# Patient Record
Sex: Female | Born: 1945 | Race: White | Hispanic: No | Marital: Married
Health system: Midwestern US, Community
[De-identification: ages and names within clinical notes are randomized; demographics above are authoritative.]

## PROBLEM LIST (undated history)

## (undated) DIAGNOSIS — Z1239 Encounter for other screening for malignant neoplasm of breast: Secondary | ICD-10-CM

## (undated) DIAGNOSIS — G43909 Migraine, unspecified, not intractable, without status migrainosus: Secondary | ICD-10-CM

## (undated) DIAGNOSIS — M7502 Adhesive capsulitis of left shoulder: Secondary | ICD-10-CM

## (undated) DIAGNOSIS — K449 Diaphragmatic hernia without obstruction or gangrene: Secondary | ICD-10-CM

## (undated) DIAGNOSIS — N390 Urinary tract infection, site not specified: Secondary | ICD-10-CM

## (undated) DIAGNOSIS — D509 Iron deficiency anemia, unspecified: Secondary | ICD-10-CM

## (undated) DIAGNOSIS — D500 Iron deficiency anemia secondary to blood loss (chronic): Secondary | ICD-10-CM

## (undated) DIAGNOSIS — R059 Cough, unspecified: Secondary | ICD-10-CM

## (undated) DIAGNOSIS — R634 Abnormal weight loss: Secondary | ICD-10-CM

## (undated) DIAGNOSIS — I10 Essential (primary) hypertension: Secondary | ICD-10-CM

## (undated) DIAGNOSIS — E0500 Thyrotoxicosis with diffuse goiter without thyrotoxic crisis or storm: Secondary | ICD-10-CM

## (undated) DIAGNOSIS — E0590 Thyrotoxicosis, unspecified without thyrotoxic crisis or storm: Secondary | ICD-10-CM

## (undated) DIAGNOSIS — R7989 Other specified abnormal findings of blood chemistry: Secondary | ICD-10-CM

## (undated) DIAGNOSIS — M542 Cervicalgia: Secondary | ICD-10-CM

## (undated) DIAGNOSIS — G43809 Other migraine, not intractable, without status migrainosus: Secondary | ICD-10-CM

## (undated) DIAGNOSIS — D508 Other iron deficiency anemias: Secondary | ICD-10-CM

## (undated) DIAGNOSIS — E063 Autoimmune thyroiditis: Secondary | ICD-10-CM

## (undated) DIAGNOSIS — M2241 Chondromalacia patellae, right knee: Secondary | ICD-10-CM

## (undated) DIAGNOSIS — M5413 Radiculopathy, cervicothoracic region: Secondary | ICD-10-CM

## (undated) DIAGNOSIS — G43719 Chronic migraine without aura, intractable, without status migrainosus: Secondary | ICD-10-CM

## (undated) DIAGNOSIS — G589 Mononeuropathy, unspecified: Secondary | ICD-10-CM

## (undated) DIAGNOSIS — G43711 Chronic migraine without aura, intractable, with status migrainosus: Secondary | ICD-10-CM

## (undated) DIAGNOSIS — D124 Benign neoplasm of descending colon: Secondary | ICD-10-CM

## (undated) DIAGNOSIS — E785 Hyperlipidemia, unspecified: Secondary | ICD-10-CM

## (undated) DIAGNOSIS — D6489 Other specified anemias: Secondary | ICD-10-CM

## (undated) DIAGNOSIS — E038 Other specified hypothyroidism: Secondary | ICD-10-CM

## (undated) DIAGNOSIS — E781 Pure hyperglyceridemia: Secondary | ICD-10-CM

## (undated) DIAGNOSIS — R519 Headache, unspecified: Secondary | ICD-10-CM

## (undated) DIAGNOSIS — K219 Gastro-esophageal reflux disease without esophagitis: Secondary | ICD-10-CM

## (undated) DIAGNOSIS — G44041 Chronic paroxysmal hemicrania, intractable: Secondary | ICD-10-CM

## (undated) DIAGNOSIS — L299 Pruritus, unspecified: Secondary | ICD-10-CM

---

## 2008-10-20 LAB — CBC WITH AUTO DIFFERENTIAL
Basophils %: 1.1 % (ref 0.0–2.0)
Basophils Absolute: 0.1 10*3 (ref 0.0–0.2)
Eosinophils %: 1.6 % (ref 0.0–5.0)
Eosinophils Absolute: 0.1 10*3 (ref 0.0–0.6)
Granulocyte Absolute Count: 4.9 10*3 (ref 1.7–7.7)
Hematocrit: 42.5 % (ref 36.0–48.0)
Hemoglobin: 14.2 g/dL (ref 12.0–16.0)
Lymphocytes %: 28.5 % (ref 25.0–40.0)
Lymphocytes Absolute: 2.3 10*3 (ref 1.0–5.1)
MCH: 29.5 pg (ref 26–34)
MCHC: 33.4 g/dL (ref 31–36)
MCV: 88.2 fl (ref 80–100)
MPV: 7.7 fl (ref 5.0–10.5)
Monocytes %: 6.1 % (ref 0.0–8.0)
Monocytes Absolute: 0.5 10*3 (ref 0.0–0.95)
Platelets: 258 10*3 (ref 135–450)
RBC: 4.82 10*6 (ref 4.0–5.2)
RDW: 15.8 % — ABNORMAL HIGH (ref 11.5–14.5)
Segs Relative: 62.7 % (ref 42.0–63.0)
WBC: 7.9 10*3 (ref 4.0–11.0)

## 2008-10-20 LAB — COMPREHENSIVE METABOLIC PANEL
ALT: 20 U/L (ref 10–40)
AST: 27 U/L (ref 15–37)
Albumin/Globulin Ratio: 1.3 (ref 1.1–2.2)
Albumin: 5.4 g/dL — ABNORMAL HIGH (ref 3.4–5.0)
Alkaline Phosphatase: 102 U/L — ABNORMAL HIGH (ref 25–100)
BUN: 13 mg/dL (ref 7–18)
CO2: 32 meq/L (ref 21–32)
Calcium: 10.5 mg/dL (ref 8.3–10.6)
Chloride: 98 meq/L — ABNORMAL LOW (ref 99–110)
Creatinine: 0.9 mg/dL (ref 0.6–1.2)
GFR Est, African/Amer: >60
GFR, Estimated: >60 (ref >60)
Glucose: 96 mg/dL (ref 70–99)
Potassium: 3.7 meq/L (ref 3.5–5.1)
Sodium: 139 meq/L (ref 136–145)
Total Bilirubin: 0.5 mg/dL (ref 0.0–1.0)
Total Protein: 9.5 g/dL — ABNORMAL HIGH (ref 6.4–8.2)

## 2008-10-20 LAB — AMYLASE: Amylase: 146 U/L — ABNORMAL HIGH (ref 25–115)

## 2008-10-20 LAB — LIPASE: Lipase: 33 U/L (ref 5.6–51.3)

## 2008-10-20 NOTE — ED Provider Notes (Unsigned)
PATIENT NAME                  PA #             MR #                  Linda [name], Linda [name]              [id number]       [id number]            EMERGENCY ROOM PHYSICIAN                 ADM DATE                     Burr Medico MD                         10/20/2008                   DATE OF BIRTH    AGE            PATIENT TYPE      RM #               03-Jan-1946       63             MEQ               ""                    REASON FOR VISIT:  Headache and vomiting.     HISTORY OF PRESENT ILLNESS:  The patient is a 63 year old who presented to  the emergency department with multiple complaints.  She states it has been  going on for 2 days.  It started with nausea and vomiting.  She has a  headache.  She does get headaches.  She gets migraines.  She denies fevers,  but has chills.  She has had some loose stools as well.  She has body aches.   No leg pain, swelling, or rashes.  No burning or discomfort with urination.     PAST MEDICAL HISTORY:  Hypercholesterolemia, reflux.     MEDICATIONS:  1.  Prevacid.  2.  Crestor.     ALLERGIES:  No known drug allergies.     SOCIAL HISTORY:  The patient does smoke.     FAMILY HISTORY:  Noncontributory.     REVIEW OF SYSTEMS:  As above.  CONSTITUTIONAL:  No fevers or chills.  HEENT:  Does have a headache throughout.  It is 8/10.  No ear, nose, or  throat pain.  No neck pain.  Denies chest pain or palpitations.    LUNGS:  No cough, congestion, or shortness of breath.  ABDOMEN:  No _____, but has nausea, vomiting, diarrhea.     PHYSICAL EXAMINATION:  VITAL SIGNS:  Temperature 98.2, pulse 78, respirations 20, blood pressure  159/85.  GENERAL:  She is awake, alert, well-developed, well-nourished.  She is in no  obvious distress.  HEENT:  Normocephalic.  Ears, nose, and throat are clear.  Pupils are equal,  round, and reactive.  NECK:  Supple.  HEART:  Regular rate and rhythm.  LUNGS:  Clear.  ABDOMEN:  Soft, nontender, nondistended.  Positive bowel sounds.    EXTREMITY EXAM:  No clubbing, no  cyanosis.     The rest of the exam is unremarkable.  LABORATORIES:  Urinalysis was negative.  WBCs 7.9, hemoglobin 14, hematocrit  42, platelets 258.  Amylase and lipase were unremarkable.  _____  unremarkable.  The rest of exam was unremarkable.     ASSESSMENT/PLAN:  A 63 year old with acute gastroenteritis.  She likely has  set off one of her migraines.  I gave her IV fluid.  She has had morphine,  Dilaudid, Phenergan.  She is pain free and feeling better.  I am going to  discharge her with Phenergan.  Instructed to follow up on return.                                Burr Medico, MD     Ernestina Penna /[id number]  DD: 10/20/2008  DT: 10/20/2008  Job #: [id number]

## 2008-10-21 LAB — URINALYSIS WITH REFLEX TO CULTURE
Bilirubin, Urine: NEGATIVE
Glucose, UA: NEGATIVE
Leukocyte Esterase, Urine: NEGATIVE
Nitrite, Urine: NEGATIVE
Protein, UA: NEGATIVE
Specific Gravity, UA: 1.01 (ref 1.003–1.030)
Urobilinogen, Urine: 0.2 EU/dl (ref <2.0)
pH, UA: 7 (ref 4.5–8.0)

## 2008-12-07 LAB — COMPREHENSIVE METABOLIC PANEL
ALT: 17 U/L (ref 10–40)
AST: 26 U/L (ref 15–37)
Albumin/Globulin Ratio: 1.4 (ref 1.1–2.2)
Albumin: 4.7 g/dL (ref 3.4–5.0)
Alkaline Phosphatase: 102 U/L — ABNORMAL HIGH (ref 25–100)
BUN: 18 mg/dL (ref 7–18)
CO2: 28 meq/L (ref 21–32)
Calcium: 10 mg/dL (ref 8.3–10.6)
Chloride: 106 meq/L (ref 99–110)
Creatinine: 0.9 mg/dL (ref 0.6–1.2)
GFR Est, African/Amer: >60
GFR, Estimated: >60 (ref >60)
Glucose: 93 mg/dL (ref 70–99)
Potassium: 4.2 meq/L (ref 3.5–5.1)
Sodium: 142 meq/L (ref 136–145)
Total Bilirubin: 0.3 mg/dL (ref 0.0–1.0)
Total Protein: 8.1 g/dL (ref 6.4–8.2)

## 2008-12-07 LAB — TSH: TSH: 2.95 u[IU]/mL (ref 0.35–5.5)

## 2008-12-14 LAB — FERRITIN: Ferritin: 8.3 ng/mL — ABNORMAL LOW (ref 10–291)

## 2009-08-04 NOTE — ED Provider Notes (Unsigned)
PATIENT NAME                  PA #             MR #                  Linda [name], Linda [name]              [id number]       [id number]            EMERGENCY ROOM PHYSICIAN                 ADM DATE                     Narelle Schoening, DO                         08/04/2009                   DATE OF BIRTH    AGE            PATIENT TYPE      RM #               08/26/1945       63             MEQ                                     PRIMARY CARE PHYSICIAN:  Woodroe Chen, MD     This is a 63 year old white female who says she has been having a headache  for 3 days, right side of her head.  She said she gets headaches.  She  describes them as being migraines.  She has taken a variety of medication,  nothing seems to help, some nausea reported but no vomiting, is able to keep  fluids down.  She took some over-the-counter migraine medicine earlier with  no relief.       ALLERGIES:  Denies any allergies.     CURRENT MEDICATIONS:    1.  Crestor.  2.  Prevacid.     PAST MEDICAL HISTORY:  She has reflux, hyperlipidemia and migraine headaches.     PAST SURGICAL HISTORY:  Bilateral tubal ligation.     CHEMICAL HISTORY:  She does not smoke or drink.     SOCIAL HISTORY:  She lives in her home with her husband.  She is married.     FAMILY HISTORY:  Noncontributory.     REVIEW OF SYSTEMS:  NEUROLOGIC:  Headache.  GI:  Reflux, nausea.     All systems reviewed are negative.     PHYSICAL EXAMINATION:  VITAL SIGNS:  Stable.  APPEARANCE:  The patient is a well-developed female in no distress.  HEENT:  Pupils equal and reactive to light bilaterally.  Tongue is midline.  NECK:  Normal tone.  NEUROLOGIC:  Cranial nerves II through XII grossly intact.  SKIN:  Clear.     LABORATORY:  No labs.     EMERGENCY ROOM COURSE:  Discussed with patient at length that she needs to  follow up with her primary care physician regarding these headaches.  She  seems to have no luck with any medication that has been prescribed or given  in the emergency room and it is time  her doctor get involved and maybe  she  needs further evaluation.  These may not be migraines, it may be something  more serious or just a different type of headache.  We gave her Toradol and  regular IM, she said it did not help them.  She did drive here.  I will not  give her anything stronger.     DIAGNOSIS:  Headache.     PLAN:  She is given a prescription for Fiorinal one every 4 hours for pain,  dispense 6 with no refills and she is instructed to call her doctor Monday  for further follow up of her headaches.     CONDITION:  Stable.                                   Annabell Sabal, DO     [id number]  DD: 08/04/2009  DT: 08/04/2009  Job #: [id number]  CC:

## 2009-08-08 LAB — URINALYSIS WITH REFLEX TO CULTURE
Bilirubin, Urine: NEGATIVE
Glucose, UA: NEGATIVE mg/dl
Ketones, Urine: 15 mg/dl — ABNORMAL HIGH
Nitrite, Urine: NEGATIVE
Protein, UA: NEGATIVE mg/dl
Specific Gravity, UA: 1.01
Urobilinogen, Urine: 0.2 EU/dl (ref <2.0)
pH, UA: 5 (ref 4.5–8.0)

## 2009-08-08 NOTE — ED Provider Notes (Unsigned)
PATIENT NAME                  PA #             MR #                  BREINDEL, HUCK              [id number]       [id number]            EMERGENCY ROOM PHYSICIAN                 ADM DATE                     Kent Braunschweig Clifton Custard, MD                     08/08/2009                   DATE OF BIRTH    AGE            PATIENT TYPE      RM #               Dec 21, 1945       64             ERQ                                     CHIEF COMPLAINT:  Headache.     HISTORY:  This is a 64 year old female with headache.  The patients headache  has been going on for the last week.  Has had some vomiting and diarrhea as  well the headache.  Says she usually has the vomiting and then the headache  begins.  This pain is a sharp, constant pain, 10/10 in intensity.  Was seen  in Dr. Eliseo Squires office and had several pain medications that did not relieve her  pain; was sent here for further evaluation.  The patient denies any fevers,  denies any cough, rhinorrhea.       PAST MEDICAL HISTORY:  Migraines.       ALLERGIES:  No known drug allergies.     MEDICATIONS:  Please see medication sheet.     SOCIAL HISTORY:  Positive tobacco.     REVIEW OF SYSTEMS:  As above.  GENERAL:    Negative fever.  RESPIRATORY:  No cough.  GASTROINTESTINAL:  Negative vomiting.     Otherwise, the remainder of the review of systems is negative.     NURSES NOTES:  Reviewed.     PHYSICAL EXAMINATION:  GENERAL:  The patient is well nourished, in no apparent distress.  HEENT:  Mucous membranes are moist.  No erythema in the oropharynx.    CARDIOVASCULAR:  Heart is regular rate and rhythm, no murmurs, clicks or  gallops.  RESPIRATORY:  Lungs are clear to auscultation bilaterally.  No tachypnea.  No  retractions.  GASTROINTESTINAL:  Abdomen nontender, nondistended, soft and positive for  bowel sounds.   MUSCULOSKELETAL:  Neck is supple.  No edema of the lower extremities.  The  lower extremities are nontender.   INTEGUMENT:  No rashes, no abrasions, good color, warm.  NEUROLOGIC:   The patient is awake and alert.  Cranial nerves grossly intact.   Muscle strength is 5+ in both upper and lower extremities.  Sensation  intact  in both upper and lower extremities.     ASSESSMENT AND PLAN/DIAGNOSTIC STUDIES:  Urinalysis showed a high ketone.     EMERGENCY DEPARTMENT COURSE:  The patient remained hemodynamically stable  throughout the emergency department course.     MEDICAL DECISION MAKING:  The patient received fluid hydration, Dilaudid,  Zofran, Reglan; was feeling much better.      ASSESSMENT:  Migraine headache.     PLAN:  We will start her back on Relpax and give her Medrol Dosepak.  Advised  to followup with Dr. Eliseo Squires.     DIAGNOSIS:  Migraine.                                Maurya Nethery Rogers Seeds, MD     [id number]  DD: 08/08/2009 22:03  DT: 08/09/2009 10:59  Job #: [id number]  CC:

## 2010-01-14 ENCOUNTER — Encounter

## 2010-01-16 NOTE — Progress Notes (Signed)
Subjective:      Patient ID: Linda [name] is a 64 y.o. female.    HPIWith a remote history of asthma and smoking has had a longstanding productive cough 3 weeks.  She was wheezing.  Never felt really sick.  Started with a cold and moved into chest.  Only been on mucinex.      Review of Systems    Objective:   Physical Exam   Constitutional: She appears well-developed and well-nourished.   HENT:   Head: Normocephalic and atraumatic.   Right Ear: External ear normal.   Left Ear: External ear normal.   Neck: Neck supple. No JVD present. No tracheal deviation present. No thyromegaly present.   Cardiovascular: Normal rate, regular rhythm and normal heart sounds.  Exam reveals no friction rub.    No murmur heard.  Pulmonary/Chest: Effort normal.        Speaking in full sentences but rhonchorous cough with end exp wheezing   Abdominal: Soft. Bowel sounds are normal.   Musculoskeletal: She exhibits no edema.   Lymphadenopathy:     She has no cervical adenopathy.       Assessment:      1.prolonged cough:  Former smoker check cxr try zpak and symbicort  2.urgency incontinence with cough detrol used to work reorder  3. Depression stable      Plan:    As above

## 2010-01-18 LAB — VITAMIN D 25 HYDROXY: Vit D, 25-Hydroxy: 24 ng/ml — ABNORMAL LOW (ref 30–80)

## 2010-03-07 NOTE — Progress Notes (Signed)
Subjective:      Patient ID: Linda [name] is a 64 y.o. female.    HPIhas been outside not gardening.  Reports onset red itchy upper eyelid on the right.  It comes and goes.  No new make up or creams.  Now she has a nonpruritic spot on her upper lid    Review of Systems    Objective:   Physical ExamVSS  Right upper lid red and swollen  Right side upper lip two red blotches  Chest small blisters suntanned ?spiders  Assessment:      Contact derm      Plan:    elocon

## 2010-04-28 LAB — CBC WITH AUTO DIFFERENTIAL
Basophils %: 0.3 % (ref 0.0–2.0)
Basophils Absolute: 0 10*3 (ref 0.0–0.2)
Eosinophils %: 2.7 % (ref 0.0–5.0)
Eosinophils Absolute: 0.3 10*3 (ref 0.0–0.6)
Granulocyte Absolute Count: 5.9 10*3 (ref 1.7–7.7)
Hematocrit: 42.2 % (ref 36.0–48.0)
Hemoglobin: 14.2 gm/dl (ref 12.0–16.0)
Lymphocytes %: 31 % (ref 25.0–40.0)
Lymphocytes Absolute: 3 10*3 (ref 1.0–5.1)
MCH: 32 pg (ref 26–34)
MCHC: 33.7 gm/dl (ref 31–36)
MCV: 95 fl (ref 80–100)
MPV: 8.6 fl (ref 5.0–10.5)
Monocytes %: 5.1 % (ref 0.0–12.0)
Monocytes Absolute: 0.5 10*3 (ref 0.0–1.3)
Platelets: 224 10*3 (ref 135–450)
RBC: 4.44 10*6 (ref 4.0–5.2)
RDW: 14.6 % — ABNORMAL HIGH (ref 11.5–14.5)
Segs Relative: 60.9 % (ref 42.0–63.0)
WBC: 9.7 10*3 (ref 4.0–11.0)

## 2010-04-28 LAB — BASIC METABOLIC PANEL
BUN: 11 mg/dl (ref 7–18)
CO2: 32 mEq/L (ref 21–32)
Calcium: 10.4 mg/dl (ref 8.3–10.6)
Chloride: 105 mEq/L (ref 99–110)
Creatinine: 0.7 mg/dl (ref 0.6–1.2)
GFR Est, African/Amer: >60
GFR, Estimated: >60 (ref >60)
Glucose: 93 mg/dl (ref 70–99)
Potassium: 4.4 mEq/L (ref 3.5–5.1)
Sodium: 142 mEq/L (ref 136–145)

## 2010-04-28 LAB — HEPATIC FUNCTION PANEL
ALT: 17 U/L (ref 10–40)
AST: 26 U/L (ref 15–37)
Albumin: 4.9 gm/dl (ref 3.4–5.0)
Alkaline Phosphatase: 98 U/L (ref 45–129)
Bilirubin, Direct: 0.14 mg/dl (ref 0.0–0.3)
Bilirubin, Indirect: 0.4 mg/dl (ref 0.0–1.0)
Total Bilirubin: 0.5 mg/dl (ref 0.0–1.0)
Total Protein: 8.3 gm/dl — ABNORMAL HIGH (ref 6.4–8.2)

## 2010-04-28 LAB — LIPASE: Lipase: 27.66 U/L (ref 5.6–51.3)

## 2010-04-28 LAB — AMYLASE: Amylase: 90 U/L (ref 25–115)

## 2010-04-28 NOTE — Progress Notes (Signed)
Subjective:      Patient ID: Linda [name] is a 63 y.o. female.    HPI Nausea x 5d, no emesis, no fever, no CP or SOB, sl lightheaded, sl abd discomfort.    Review of Systems    Objective:   Physical Exam   Constitutional: She appears well-developed and well-nourished.   Cardiovascular: Normal rate, regular rhythm, normal heart sounds and intact distal pulses.  Exam reveals no gallop and no friction rub.    No murmur heard.  Pulmonary/Chest: Effort normal and breath sounds normal. No respiratory distress. She has no wheezes. She has no rales. She exhibits no tenderness.   Abdominal: Soft. Bowel sounds are normal. She exhibits no distension and no mass. Tenderness is present. She has no rebound and no guarding.       Assessment:    Nausea  CBC,Renal,Liver,AMylase/Lipase  RUQ [place]  Phenrgan

## 2010-05-08 NOTE — Progress Notes (Signed)
Subjective:      Patient ID: Linda [name] is a 64 y.o. female.    HPIrecently went through screening and had a TC 302 and TG 320 BMI 27.8 mild asvd in carotids bl.  She is concerned about her weight being up and her BMI and lipids and risks for stroke.      Review of Systems    Objective:   Physical Exam  130/70  Chest ctap  Cardio rrr no mrg  abdo pos bs nontender  Ext wthout edema  No carotid bruit  Assessment:    1. Overweight Recommend weight watchers and SDaily exercise program and change to cymbalta from paxil which is weight neutral  2.  ? Carotid disease check [place]   3. Combined hyperlipidemia fasting labs    Plan:    As above

## 2010-05-12 LAB — CBC WITH AUTO DIFFERENTIAL
Basophils %: 0.3 % (ref 0.0–2.0)
Basophils Absolute: 0 10*3 (ref 0.0–0.2)
Eosinophils %: 3.5 % (ref 0.0–5.0)
Eosinophils Absolute: 0.2 10*3 (ref 0.0–0.6)
Granulocyte Absolute Count: 2.8 10*3 (ref 1.7–7.7)
Hematocrit: 38.3 % (ref 36.0–48.0)
Hemoglobin: 12.8 gm/dl (ref 12.0–16.0)
Lymphocytes %: 48 % — ABNORMAL HIGH (ref 25.0–40.0)
Lymphocytes Absolute: 3.3 10*3 (ref 1.0–5.1)
MCH: 31.7 pg (ref 26–34)
MCHC: 33.5 gm/dl (ref 31–36)
MCV: 94.6 fl (ref 80–100)
MPV: 8.1 fl (ref 5.0–10.5)
Monocytes %: 6.6 % (ref 0.0–12.0)
Monocytes Absolute: 0.5 10*3 (ref 0.0–1.3)
Platelets: 194 10*3 (ref 135–450)
RBC: 4.05 10*6 (ref 4.0–5.2)
RDW: 14 % (ref 11.5–14.5)
Segs Relative: 41.6 % — ABNORMAL LOW (ref 42.0–63.0)
WBC: 6.8 10*3 (ref 4.0–11.0)

## 2010-05-12 LAB — COMPREHENSIVE METABOLIC PANEL
ALT: 21 U/L (ref 10–40)
AST: 26 U/L (ref 15–37)
Albumin/Globulin Ratio: 1.4 (ref 1.1–2.2)
Albumin: 4.6 gm/dl (ref 3.4–5.0)
Alkaline Phosphatase: 109 U/L (ref 45–129)
BUN: 6 mg/dl — ABNORMAL LOW (ref 7–18)
CO2: 28 mEq/L (ref 21–32)
Calcium: 9.7 mg/dl (ref 8.3–10.6)
Chloride: 101 mEq/L (ref 99–110)
Creatinine: 0.7 mg/dl (ref 0.6–1.2)
GFR Est, African/Amer: >60
GFR, Estimated: >60 (ref >60)
Glucose: 94 mg/dl (ref 70–99)
Potassium: 4.1 mEq/L (ref 3.5–5.1)
Sodium: 140 mEq/L (ref 136–145)
Total Bilirubin: 0.5 mg/dl (ref 0.0–1.0)
Total Protein: 7.8 gm/dl (ref 6.4–8.2)

## 2010-05-12 LAB — LIPID PANEL
Cholesterol, Total: 238 mg/dl — ABNORMAL HIGH (ref <200)
HDL: 43 mg/dl (ref 40–60)
LDL Direct: 149 mg/dl — ABNORMAL HIGH (ref <100)
Triglycerides: 388 mg/dl — ABNORMAL HIGH (ref <150)

## 2010-05-12 LAB — TSH: TSH: 2.2 u[IU]/mL (ref 0.35–5.5)

## 2010-05-18 NOTE — Procedures (Unsigned)
PATIENT NAME:                                PA #:                     MR #Linda [name], Linda [name]                             [id number]                 [id number]                ATTENDING PHYSICIAN:                                   SERVICE DATE:        DIS DATE:            Melbourne Abts, MD                                       05/18/2010                                DATE OF BIRTH:     AGE:            PATIENT TYPE:      RM #:           06-09-1946         63              OPQ                                   VASCULAR LABORATORY TECHNICIAN:  Leonides Sake      INDICATIONS:  This patient is here for carotid artery scanning based on  symptoms of dizziness and 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                    RIGHT SIDE                LEFT SIDE                    Brachial BP VELOCITY cm/sec STENOSIS    VELOCITY cm/sec STENOSIS       RT:   120                   % of                        % of           LT:    120      PSV/EDV*    diameter        PSV/EDV*    diameter  reduction                   reduction      CCA                         5 to 10                     0              Proximal    94                          79                             Mid         72                          88                             Distal      80                          79                             ICA                                                     0              Proximal    68/28                       75/39                          Mid         63/31                       119/61                         Distal      107/53 (0 to                92/44 (0 to                                15%)                        15%)                           ECA                         0           75  0              Proximal                                                               Mid                                                                     Distal                                                                 VERTEBRAL   42 (antegrade)  2.7 mm      37 (antegrade)                    ICA/CCA >4 =  Less than 1               Less than 1              >70%                                                                PLAQUE RIGHT:  There is atherosclerotic change occurring in       sporadic deposits.  There is some intimal thickening through      the common carotid artery.  There is no major blood flow          disturbance.                                                      PLAQUE LEFT:  The patient has atherosclerotic deposits through    the carotid bifurcation, particularly at the origin of the ICA.    There is some calcific change in the plaque.  It does not        cause blood flow disturbance of any hemodynamic significance.        OVERALL IMPRESSION:                                               1.  No evidence of hemodynamically significant carotid arterial   occlusive disease.  2.  Scattered areas of atherosclerotic change in the carotid      arteries bilaterally.                                             3.  Normal vertebral artery blood flow.                              VELOCITY/SPECTRAL ANALYSIS        DIAMETER REDUCTION              PSV<125 cm/s/No Spectral Changes  0-15%                           PSV<125 cm/s/Spectral Broadening  16-49%                          PSV>125 cm/s Marked Spectral      50-79%                          Broadening                                                        EDV>140 cm/s, PSV>125 cm/s        80-99%                          No flow,  thump                   100%                            PSV = Peak Systolic Velocity      NV = Nonvisualized                                                EDV = End Diastolic Velocity       C.M.S. RECOMMENDATIONS FOR FOLLOW-UP SURVEILLANCE                                            +  16-49% STENOSIS-ANNUAL FOLLOW-UP                                                          +  50-79% STENOSIS-REPEAT EVERY 6 MONTHS                                                    +  80-99% STENOSIS-IF SURGERY NOT PERFORMED, FURTHER SURVEILLANCE NEEDS  DOCUMENTATION     TO SUPPORT MEDICAL NECESSITY                                                                 +  POST CEA-REPEAT AT 6 WEEKS, 6 MONTHS  ANNUALLY                            & ANNUALLY                                                                                 Linda Heslin Laurier Nancy, MD     [phone]  DD: 05/18/2010 22:19   DT: 05/18/2010 22:39   Job #: [id number]  CC: [place] Abts, MD

## 2010-05-25 MED ORDER — PAROXETINE HCL 40 MG PO TABS
40 MG | ORAL_TABLET | Freq: Every day | ORAL | Status: DC
Start: 2010-05-25 — End: 2010-05-25

## 2010-05-25 MED ORDER — PAROXETINE HCL 40 MG PO TABS
40 MG | ORAL_TABLET | ORAL | Status: DC
Start: 2010-05-25 — End: 2010-06-19

## 2010-05-25 NOTE — Telephone Encounter (Signed)
Sent to the pharmacy

## 2010-05-25 NOTE — Telephone Encounter (Signed)
Pt is [phone] and they said they do not have it on file there and a new script needs to be sent over.   LOV-05/08/10    Next Appt-06/21/10

## 2010-06-19 MED ORDER — DULOXETINE HCL 60 MG PO CPEP
60 MG | ORAL_CAPSULE | Freq: Every day | ORAL | Status: DC
Start: 2010-06-19 — End: 2010-07-25

## 2010-06-19 MED ORDER — AZITHROMYCIN 250 MG PO TABS
250 MG | PACK | ORAL | Status: AC
Start: 2010-06-19 — End: 2010-06-29

## 2010-06-19 MED ORDER — PAROXETINE HCL 40 MG PO TABS
40 MG | ORAL_TABLET | Freq: Every day | ORAL | Status: DC
Start: 2010-06-19 — End: 2010-07-25

## 2010-06-19 NOTE — Telephone Encounter (Signed)
Pt is in need of an appointment today if possible.  Pt has a current c/o coughing-greenish, 'sour taste",slight fever, "hot and cold chills",body aches,sporadic h/a.   Onset: over a week   Pt has taken the following for relief:dayquil,benadryl   NKDA   Please call to advise .Thank you!  PHO: [phone]

## 2010-06-19 NOTE — Telephone Encounter (Signed)
Pt notified coming in at 6

## 2010-06-19 NOTE — Telephone Encounter (Signed)
Can add in at 5:45 or 6:00

## 2010-06-19 NOTE — Progress Notes (Signed)
Subjective:      Patient ID: Linda [name] is a 64 y.o. female.    HPIuri sxs for over four days and now has a cough and green phlegm.  She has a headache, nausea and dizziness/vertigo.    Carotids were negative    Review of Systems  The skin around her nailbeds is not better  She has been off paxil for five days and getting more postmenopausal sxs and diarrhea  Cannot tell a difference on cymbalta so far.30mg   Objective:   Physical Exam  bp 130/90  heent tms neg sinuses nontender op pink cervical nodes neg  Chest ctap  Cardio RRR  Assessment:      1.a sinusitis: zpak  2.dermatitis ?unclear around fingers vs eczema  3. Depression;  Continue cymbalta increase to 60 and resume paxil   4. htn recheck   Plan:      As above

## 2010-06-19 NOTE — Telephone Encounter (Signed)
Please advise

## 2010-06-22 NOTE — Telephone Encounter (Signed)
Refer to [facility] [phone] and [facility]

## 2010-06-22 NOTE — Telephone Encounter (Signed)
Spoke to patient   She will call dr Kizzie Banehughes office for an appt

## 2010-06-22 NOTE — Telephone Encounter (Signed)
Spoke to patient     She is not going to see the dermatologist at this time.   She would like a referral to a specialist for her migraines.  She was sick all day yesterday with a migraine.     Patient's cell  [phone]

## 2010-06-26 NOTE — Telephone Encounter (Signed)
suz

## 2010-06-26 NOTE — Telephone Encounter (Signed)
We will try send to [facility]

## 2010-06-26 NOTE — Telephone Encounter (Signed)
Pt said she called [facility] due to boils on her fingers but they can not see her until January. Pt does not feel there is anyway she can wait till then. She has used the ointment that Dr.Sax prescribed with no relief. Can Dr.Sax get her into a dermatologist sooner or recommend another med for her to try?    [facility]  [facility] [phone]  LOV-06/19/10

## 2010-06-26 NOTE — Telephone Encounter (Signed)
DO u have another med she can try

## 2010-06-27 NOTE — Telephone Encounter (Signed)
FYI    Pt has an appt on dec 13 at 1:00pm   With [facility]   i notified the patient

## 2010-07-25 MED ORDER — PAROXETINE HCL 40 MG PO TABS
40 MG | ORAL_TABLET | Freq: Every day | ORAL | Status: DC
Start: 2010-07-25 — End: 2011-07-30

## 2010-07-25 NOTE — Progress Notes (Signed)
Subjective:      Patient ID: Linda [name] is a 64 y.o. female.    HPI  uri sxs for over four days and now has a cough and green phlegm.  She has a headache, nausea and dizziness/vertigo.    Carotids were negative    Is off paxil and is on cymbalta 60mg .  She is having insomnia and feeling more irritable .  She would like to go back on the paxil.  The rash around the nailbeds cleared on its own and with time.  She is fasting for labs.   Review of Systems    The skin around her nailbeds is not better  She has been off paxil for five days and getting more postmenopausal sxs and diarrhea  Cannot tell a difference on cymbalta so far.30mg   Objective:   Physical Exam    bp 130/90  heent tms neg sinuses nontender op pink cervical nodes neg  Chest ctap  Cardio RRR    110,70  Skin on fingers much better  Assessment:      1.a sinusitis: zpak  2.dermatitis ?unclear around fingers vs eczema  3. Depression;  Continue cymbalta increase to 60 and resume paxil   4. htn recheck     1.inc bp much better  2.  High cholesterol fasting check  3. Depression change back to Paxil 20  Plan:      As above

## 2011-03-29 MED ORDER — DETROL LA 4 MG PO CP24
4 MG | ORAL_CAPSULE | ORAL | Status: DC
Start: 2011-03-29 — End: 2011-10-15

## 2011-03-29 MED ORDER — CRESTOR 20 MG PO TABS
20 MG | ORAL_TABLET | ORAL | Status: DC
Start: 2011-03-29 — End: 2011-10-15

## 2011-07-30 MED ORDER — PAROXETINE HCL 40 MG PO TABS
40 MG | ORAL_TABLET | ORAL | Status: DC
Start: 2011-07-30 — End: 2011-10-15

## 2011-08-09 NOTE — Other (Unsigned)
ED PATIENT ENCOUNTER ARRIVAL: 08/09/11 1038             EMERGENCY DEPARTMENT -  Back NOTE     CHIEF COMPLAINT Chief Complaint Patient presents with  Fall  Back Injury     Person giving history: patient Arrival by: self     HPI Linda [name] is a 66 year old female who presents with back pain   from slipping the ice on a step 4 days ago.  Landed on back.  Hurt   immediately.  Pain not going away and getting worse.  No head injury, slt sore   around shoulders. No neck pain, no chest pain, no leg pain, no arm pain.    Pain is low all the way across.  No blood in urine.     REVIEW OF SYSTEMS Constitutional:  Denies fever, sweats, chills, weight loss   or weakness Eyes:  Denies visual disturbance, pain or discharge HENT:  Denies   sore throat, ear pain or nasal congestion Neck:  Denies soreness or stiffness   Respiratory:  Denies cough or shortness of breath Cardiovascular:  Denies   chest pain or palpitations GI:  Denies abdominal pain, nausea, vomiting, or   diarrhea.  Denies painful or difficult swallowing. GU:  Denies difficulties   with urination, frequency, urgency, bladder pain. Musculoskeletal:  See HPI   Skin:  Denies rash Neurologic:  Denies headache, trouble with coordination,   extremities weakness, trouble with walking or trouble with balance. Lymphatic:    Denies lumps or bumps in lymphatic areas, Psychiatric:  Denies trouble   thinking, confusion, hallucinations All systems reviewed.  Other than the   aformentioned symptomatology all systems negative.         PAST MEDICAL HISTORY Past Medical History Diagnosis Date  Hyperlipemia    Anxiety  Depression  GERD (gastroesophageal reflux disease)  Migraine     SURGICAL HISTORY Past Surgical History Procedure Date  Back surgery     CURRENT MEDICATIONS Current Outpatient Rx Name Route Sig Dispense Refill    LANSOPRAZOLE 30 MG PO CPDR Oral Take 30 mg by mouth 2 (two) times daily.    PAROXETINE HCL 20 MG PO TABS Oral Take 20 mg by mouth every morning.     ROSUVASTATIN CALCIUM 20 MG PO TABS Oral Take 20 mg by mouth daily.  TOPIRAMATE   25 MG PO TABS Oral Take 25 mg by mouth 2 (two) times daily.     ALLERGIES Allergies no known allergies     FAMILY HISTORY No family history on file.     SOCIAL HISTORY History     Social History  Marital Status: Married   Spouse Name: N/A   Number of   Children: N/A  Years of Education: N/A     Social History Main Topics  Smoking status: Never Smoker  Smokeless tobacco:   None  Alcohol Use: Yes    socially  Drug Use: No  Sexually Active:     Other Topics Concern  None     Social History Narrative  None     PHYSICAL EXAM VITAL SIGNS: BP 170/85   Pulse 69   Temp(Src) 97.7  F (36.5  C)   (Oral)   Resp 18   Ht 64" (162.6 cm)   Wt 140 lb (63.504 kg)   BMI 24.03   kg/m2   SpO2 96% Constitutional:  Body habitus is normal, skin warm and dry,   color good, no acute distress, demeanor is  pleasant. Head:  normocephalic   Eyes:  PERRL, EOMI, conjunctiva without signs of juandice, anemia or erythema,   vision grossly intact EENT:  Pinna normal, nares normal, mucous membranes   moist, Neck: Supple, trachea midline, no NVD or thyroid enlargement Back:   tender to palpation in low left without ecchymosis,  Normal to inspection,   spinous processes in alignment Respiratory:  Pattern normal, breath sounds   normal, no rales, rhonchi, wheezing, chest wall non tender Cardiovascular:    Normal rate, normal rhythm, with no murmurs, rubs or gallops. Carotid,   femoral, radial pulses 2+ and symmetrical. GI:  Abdomen soft, non-tender,   bowel sounds present, no liver kidney or spleen enlargement, no masses, no   hernias, Musculoskeletal:  Intact distal pulses, No edema, No tenderness, No   cyanosis, No clubbing. Good range of motion in all major joints. No tenderness   to palpation or major deformities noted. Skin:  No rashes Neuro:  Motor,   stance, speech, coordination intact.  CN 2-12 intact.  KJR 2+, dorsi flexion   and plantar flexion 5/5 bilaterally  Psychiatric:  Alert and oriented x 3.    Affect, conversation and mood normal.  No abnormal thinking noted.         LABORATORY No results found for this or any previous visit (from the past 24   hour(s)).     RADIOLOGY         ED COURSE & MEDICAL DECISION MAKING Pertinent Labs & Imaging studies were   reviewed.   The final impression and the pathophysiology were discussed with   the patient.  In the ER the patient received evaluation and referral to   orthopedics.  The patient was given instructions on what to expect, followup   with the ortho doctor in 2-3 days for further management and also instructions   to return should new symptoms develop or the current symptoms worsen.     Final Impression: Compression fracture L-1     Final Diagnosis:     No diagnosis found.     Philomena Courseliver H. Loyd, MD         Philomena CourseLoyd, Oliver H., MD 08/09/11 1216         _________________________________  Signed by:    Arie SabinaLIVER H. LOYD    LO    D: 08/09/2011 12:16 PM  T: 08/09/2011 12:16 PM    This document is confidential medical information.  Unauthorized disclosure or   use of this information is prohibited by law.  If you are not the intended recipient of this document, please advise us by   calling immediately [phone].

## 2011-08-09 NOTE — Other (Unsigned)
For your records, the bill status(es) [facility] during [facility]   hospital stay is as follows: 08/09/2011 1043  Admission Emergency 08/09/2011   1259  Discharge Emergency     Any questions regarding MEDICAL RECORDS should be directed to Medical   Records: [facility] [phone] or [facility]   [phone]. Any questions regarding BILLING should be directed to the Care   Management Departments: [facility] [phone] or [facility] [phone].         _________________________________  Signed byJamison Neighbor:    [facility]  NOTIFY    ZZ    D: 08/09/2011 12:59 PM  T:    This document is confidential medical information.  Unauthorized disclosure or   use of this information is prohibited by law.  If you are not the intended recipient of this document, please advise us by   calling immediately [phone].

## 2011-08-09 NOTE — Other (Unsigned)
ED PATIENT ENCOUNTER ARRIVAL: 08/09/11 1038             EMERGENCY DEPARTMENT -  Back NOTE     CHIEF COMPLAINT Chief Complaint Patient presents with  Fall  Back Injury     Person giving history: patient Arrival by: self     HPI Linda [name] is a 66 year old female who presents with back pain   from slipping the ice on a step 4 days ago.  Landed on back.  Hurt   immediately.  Pain not going away and getting worse.  No head injury, slt sore   around shoulders. No neck pain, no chest pain, no leg pain, no arm pain.    Pain is low all the way across.  No blood in urine.  No perineal problems or   loss of urine or loss of feces.     REVIEW OF SYSTEMS Constitutional:  Denies fever, sweats, chills, weight loss   or weakness Eyes:  Denies visual disturbance, pain or discharge HENT:  Denies   sore throat, ear pain or nasal congestion Neck:  Denies soreness or stiffness   Respiratory:  Denies cough or shortness of breath Cardiovascular:  Denies   chest pain or palpitations GI:  Denies abdominal pain, nausea, vomiting, or   diarrhea.  Denies painful or difficult swallowing. GU:  Denies difficulties   with urination, frequency, urgency, bladder pain. Musculoskeletal:  See HPI   Skin:  Denies rash Neurologic:  Denies headache, trouble with coordination,   extremities weakness, trouble with walking or trouble with balance. Lymphatic:    Denies lumps or bumps in lymphatic areas, Psychiatric:  Denies trouble   thinking, confusion, hallucinations All systems reviewed.  Other than the   aformentioned symptomatology all systems negative.         PAST MEDICAL HISTORY Past Medical History Diagnosis Date  Hyperlipemia    Anxiety  Depression  GERD (gastroesophageal reflux disease)  Migraine     SURGICAL HISTORY Past Surgical History Procedure Date  Back surgery     CURRENT MEDICATIONS Current Outpatient Rx Name Route Sig Dispense Refill    LANSOPRAZOLE 30 MG PO CPDR Oral Take 30 mg by mouth 2 (two) times daily.    PAROXETINE HCL 20  MG PO TABS Oral Take 20 mg by mouth every morning.    ROSUVASTATIN CALCIUM 20 MG PO TABS Oral Take 20 mg by mouth daily.  TOPIRAMATE   25 MG PO TABS Oral Take 25 mg by mouth 2 (two) times daily.     ALLERGIES Allergies no known allergies     FAMILY HISTORY No family history on file.     SOCIAL HISTORY History     Social History  Marital Status: Married   Spouse Name: N/A   Number of   Children: N/A  Years of Education: N/A     Social History Main Topics  Smoking status: Never Smoker  Smokeless tobacco:   None  Alcohol Use: Yes    socially  Drug Use: No  Sexually Active:     Other Topics Concern  None     Social History Narrative  None     PHYSICAL EXAM VITAL SIGNS: BP 170/85   Pulse 69   Temp(Src) 97.7  F (36.5  C)   (Oral)   Resp 18   Ht 64" (162.6 cm)   Wt 140 lb (63.504 kg)   BMI 24.03   kg/m2   SpO2 96% Constitutional:  Body habitus is  normal, skin warm and dry,   color good, no acute distress, demeanor is pleasant. Head:  normocephalic   Eyes:  PERRL, EOMI, conjunctiva without signs of juandice, anemia or erythema,   vision grossly intact EENT:  Pinna normal, nares normal, mucous membranes   moist, Neck: Supple, trachea midline, no NVD or thyroid enlargement Back:   tender to palpation in low left without ecchymosis,  Normal to inspection,   spinous processes in alignment Respiratory:  Pattern normal, breath sounds   normal, no rales, rhonchi, wheezing, chest wall non tender Cardiovascular:    Normal rate, normal rhythm, with no murmurs, rubs or gallops. Carotid,   femoral, radial pulses 2+ and symmetrical. GI:  Abdomen soft, non-tender,   bowel sounds present, no liver kidney or spleen enlargement, no masses, no   hernias, Musculoskeletal:  Intact distal pulses, No edema, No tenderness, No   cyanosis, No clubbing. Good range of motion in all major joints. No tenderness   to palpation or major deformities noted. Skin:  No rashes Neuro:  Motor,   stance, speech, coordination intact.  CN 2-12 intact.  KJR 2+,  dorsi flexion   and plantar flexion 5/5 bilaterally Psychiatric:  Alert and oriented x 3.    Affect, conversation and mood normal.  No abnormal thinking noted.         LABORATORY No results found for this or any previous visit (from the past 24   hour(s)).     RADIOLOGY         ED COURSE & MEDICAL DECISION MAKING Pertinent Labs & Imaging studies were   reviewed.   The final impression and the pathophysiology were discussed with   the patient.  In the ER the patient received evaluation and referral to   orthopedics.  The patient was given instructions on what to expect, followup   with the ortho doctor in 2-3 days for further management and also instructions   to return should new symptoms develop or the current symptoms worsen.     Final Impression: Compression fracture L-1     Final Diagnosis:     No diagnosis found.     Philomena Course, MD         Philomena Course., MD 08/09/11 1216     Philomena Course., MD 08/09/11 1219         _________________________________  Signed by:    Arie Sabina    D: 08/09/2011 12:19 PM  T: 08/09/2011 12:19 PM    This document is confidential medical information.  Unauthorized disclosure or   use of this information is prohibited by law.  If you are not the intended recipient of this document, please advise [place] by   calling immediately [phone].

## 2011-10-02 NOTE — Telephone Encounter (Signed)
No appt since 2011  Patient needs an office visit. please

## 2011-10-15 MED ORDER — PROMETHAZINE HCL 25 MG/ML IJ SOLN
25 MG/ML | Freq: Once | INTRAMUSCULAR | Status: AC
Start: 2011-10-15 — End: 2011-10-15

## 2011-10-15 MED ORDER — PROPRANOLOL HCL ER 60 MG PO CP24
60 MG | ORAL_CAPSULE | Freq: Every day | ORAL | Status: DC
Start: 2011-10-15 — End: 2012-03-31

## 2011-10-15 MED ORDER — ROSUVASTATIN CALCIUM 20 MG PO TABS
20 MG | ORAL_TABLET | Freq: Every day | ORAL | Status: DC
Start: 2011-10-15 — End: 2012-03-31

## 2011-10-15 MED ORDER — PAROXETINE HCL 40 MG PO TABS
40 MG | ORAL_TABLET | Freq: Every morning | ORAL | Status: DC
Start: 2011-10-15 — End: 2012-03-31

## 2011-10-15 MED ORDER — METHYLPREDNISOLONE 4 MG PO TBPK
4 MG | PACK | ORAL | Status: AC
Start: 2011-10-15 — End: 2011-10-21

## 2011-10-15 MED ORDER — SUMATRIPTAN SUCCINATE 6 MG/0.5ML SC SOLN
6 MG/0.5ML | Freq: Once | SUBCUTANEOUS | Status: DC | PRN
Start: 2011-10-15 — End: 2011-10-15

## 2011-10-15 MED ORDER — TOLTERODINE TARTRATE ER 4 MG PO CP24
4 MG | ORAL_CAPSULE | Freq: Every day | ORAL | Status: DC
Start: 2011-10-15 — End: 2012-03-31

## 2011-10-15 MED ORDER — PROMETHAZINE HCL 25 MG/ML IJ SOLN
25 MG/ML | Freq: Once | INTRAMUSCULAR | Status: DC
Start: 2011-10-15 — End: 2011-10-15

## 2011-10-15 MED ORDER — KETOROLAC TROMETHAMINE 60 MG/2ML IM SOLN
60 MG/2ML | Freq: Once | INTRAMUSCULAR | Status: DC | PRN
Start: 2011-10-15 — End: 2011-10-15

## 2011-10-15 MED ADMIN — promethazine (PHENERGAN) injection 6.25 mg: INTRAMUSCULAR | @ 14:00:00 | NDC 00641608201

## 2011-10-15 MED ADMIN — ketorolac (TORADOL) injection 60 mg: INTRAMUSCULAR | @ 14:00:00

## 2011-10-15 MED ADMIN — SUMAtriptan (IMITREX) injection 6 mg: SUBCUTANEOUS | @ 14:00:00 | NDC 00781317471

## 2011-10-15 NOTE — Progress Notes (Signed)
Subjective:      Patient ID: Linda [name] is a 66 y.o. female.    HPIwith a history of recurrent migraines who has been having a headache for three days and vomiting and unable to hold food down.  She has been urinating but not eating.      Review of Systems  Is getting kyphoplasty later this week dr Filbert Schilder  Objective:   Physical Exam  vss  Neck supple\  Neuro nonfocal  Chest clear  Cardio RRR     Assessment:    1. Acute migraine:  Toradol, imitrex and phenergan shot  And resume propranolol er and medrol dose pack for prevention          Plan:      As above

## 2011-10-15 NOTE — Addendum Note (Signed)
Addended byOliva Bustard on: 10/15/2011 09:55 AM     Modules accepted: Orders

## 2011-10-15 NOTE — Addendum Note (Signed)
Addended byOliva Bustard on: 10/15/2011 10:03 AM     Modules accepted: Orders

## 2012-03-31 NOTE — Progress Notes (Signed)
Subjective:      Patient ID: Linda [name] is a 66 y.o. female.    HPI  With a history of high cholesterol, depression and chronic back pain who has started going to Dr [employer].  He told her she has OA and osteoporosis of the back.  SHe has not had a dexa scan in years.  She stopped all of her meds including her statin and antidepressant and feels much better. She wants to check fasting labs today.  PFSH reviewed and updated   Review of Systems   Gastrointestinal: Positive for constipation.        Uses dulcolax prn   Musculoskeletal: Positive for back pain.        Back hurts with any activity across the lumbar area  Can only do a little then needs to lie down       Objective:   Physical Exam   Nursing note and vitals reviewed.  Constitutional: She is oriented to person, place, and time. She appears well-developed and well-nourished. No distress.   HENT:   Head: Normocephalic and atraumatic.   Right Ear: External ear normal.   Left Ear: External ear normal.   Nose: Nose normal.   Mouth/Throat: Oropharynx is clear and moist.   Eyes: EOM are normal. Pupils are equal, round, and reactive to light. Right eye exhibits no discharge. Left eye exhibits no discharge.   Neck: Normal range of motion. Neck supple. No JVD present. No tracheal deviation present. No thyromegaly present.   Cardiovascular: Normal rate, regular rhythm, normal heart sounds and intact distal pulses.  Exam reveals no gallop.    No murmur heard.  130/80  130/87 right  sem   Pulmonary/Chest: Effort normal and breath sounds normal. No stridor. No respiratory distress.   Abdominal: Soft. Bowel sounds are normal. She exhibits no distension. There is no tenderness.   Musculoskeletal: Normal range of motion. She exhibits no edema.   Lymphadenopathy:     She has no cervical adenopathy.   Neurological: She is alert and oriented to person, place, and time. She has normal reflexes.   Skin: Skin is warm and dry. She is not diaphoretic.   Psychiatric: She has a  normal mood and affect. Her behavior is normal. Judgment and thought content normal.       Assessment:     1.senile osteoporosis : no recent dexa scan start there  2.high chol : fasting for labs although objects to therapy  3. Borderline diastolic bp watch for now  4. Depression controlled for now        Plan:      Linda [name] received counseling on the following healthy behaviors: nutrition, exercise and medication adherence    Discussed use, benefit, and side effects of prescribed medications.  Barriers to medication compliance addressed.  All patient questions answered.  Pt voiced understanding.

## 2012-03-31 NOTE — Patient Instructions (Signed)
Self- Management Goals for the Patient with High Cholesterol:    It is important to have goals to work towards when you have High Cholesterol.    Below is a list of goals your doctor would like you to work towards to help control your hyperlipidemia and also maintain and improve your overall health.    Please select one of these goals to try before your next follow up visit:    Goal: I will follow a low fat, low cholesterol diet by eating less than 60 grams of cholesterol per day, and less than 20 grams from saturated fats. I agree to read the food labels to find this information. I agree to increase my fiber to 20-30 grams per day and to use "good" fats to cook, such as olive and canola oils. Carbohydrates should cover 15% of my daily calories.    Guess your barriers before they happen. Everyone runs into barriers to their goals. You may already know what's going to get in your way. Write down these problems (cost? time? stress? fear?), and think of ways to get around them.     Barriers to success: none  Plan for overcoming my barriers: more fruits and veggies- d/c coca cola   Confidence: 8/10  Date goal set: 03/31/2012  Date goal attained:

## 2012-04-01 LAB — LIPID PANEL
Cholesterol, Total: 247 mg/dL — ABNORMAL HIGH (ref 0–199)
HDL: 45 mg/dL (ref 40–60)
LDL Calculated: 174 mg/dL — ABNORMAL HIGH (ref 0–99)
Triglycerides: 141 mg/dL (ref 0–149)
VLDL Cholesterol Calculated: 28 mg/dL

## 2012-04-01 LAB — COMPREHENSIVE METABOLIC PANEL
ALT: 19 U/L (ref 10–40)
AST: 26 U/L (ref 15–37)
Albumin/Globulin Ratio: 1.4 (ref 1.1–2.2)
Albumin: 4.7 g/dL (ref 3.4–5.0)
Alkaline Phosphatase: 95 U/L (ref 45–129)
BUN: 8 mg/dL (ref 7–18)
CO2: 28 mEq/L (ref 21–32)
Calcium: 10.2 mg/dL (ref 8.3–10.6)
Chloride: 104 mEq/L (ref 99–110)
Creatinine: 0.7 mg/dL (ref 0.6–1.2)
GFR African American: >60 (ref >60)
GFR Non-African American: >60 (ref >60)
Globulin: 3 g/dL
Glucose: 86 mg/dL (ref 70–99)
Potassium: 4.2 mEq/L (ref 3.5–5.1)
Sodium: 140 mEq/L (ref 136–145)
Total Bilirubin: 0.5 mg/dL (ref 0.00–1.00)
Total Protein: 8 g/dL (ref 6.4–8.2)

## 2012-04-01 LAB — CBC WITH AUTO DIFFERENTIAL
Basophils %: 0.4 %
Basophils Absolute: 0 10*3/uL (ref 0.0–0.2)
Eosinophils %: 1.2 %
Eosinophils Absolute: 0.1 10*3/uL (ref 0.0–0.6)
Hematocrit: 40.5 % (ref 36.0–48.0)
Hemoglobin: 13.1 g/dL (ref 12.0–16.0)
Lymphocytes %: 34.9 %
Lymphocytes Absolute: 2.4 10*3/uL (ref 1.0–5.1)
MCH: 31.5 pg (ref 26.0–34.0)
MCHC: 32.2 g/dL (ref 31.0–36.0)
MCV: 97.7 fL (ref 80.0–100.0)
MPV: 8.3 fL (ref 5.0–10.5)
Monocytes %: 4.8 %
Monocytes Absolute: 0.3 10*3/uL (ref 0.0–1.3)
Neutrophils %: 58.7 %
Neutrophils Absolute: 4 10*3/uL (ref 1.7–7.7)
Platelets: 229 10*3/uL (ref 135–450)
RBC: 4.14 M/uL (ref 4.00–5.20)
RDW: 14.1 % (ref 12.4–15.4)
WBC: 6.8 10*3/uL (ref 4.0–11.0)

## 2012-04-01 LAB — TSH: TSH: 4.57 u[IU]/mL (ref 0.35–5.50)

## 2012-04-02 LAB — VITAMIN D 25 HYDROXY: Vit D, 25-Hydroxy: 30 ng/mL (ref 30–80)

## 2012-05-12 NOTE — Progress Notes (Signed)
Subjective:      Patient ID: Linda [name] is a 66 y.o. female.    HPIwith a history of high cholesterol and two risk factors, age and passive exposure.  She put herself on red yeast rice and biotin and wants to recheck fasting lipids.    Never got mammo or shingles vac.    Review of Systems    Objective:   Physical Exam    Assessment:     1.lipid recheck fasting goal LDL less 130  2.due for dexa and mammo  3. Need for flu vac        Plan:      Anahita received counseling on the following healthy behaviors: nutrition, exercise and medication adherence    Discussed use, benefit, and side effects of prescribed medications.  Barriers to medication compliance addressed.  All patient questions answered.  Pt voiced understanding.

## 2012-05-13 LAB — LIPID PANEL
Cholesterol, Total: 174 mg/dL (ref 0–199)
HDL: 42 mg/dL (ref 40–60)
LDL Calculated: 109 mg/dL — ABNORMAL HIGH (ref 0–99)
Triglycerides: 113 mg/dL (ref 0–149)
VLDL Cholesterol Calculated: 23 mg/dL

## 2012-05-19 MED ORDER — MECLIZINE HCL 25 MG PO TABS
25 MG | ORAL_TABLET | Freq: Four times a day (QID) | ORAL | Status: DC | PRN
Start: 2012-05-19 — End: 2012-11-04

## 2012-05-19 MED ORDER — PROMETHAZINE HCL 25 MG PO TABS
25 MG | ORAL_TABLET | Freq: Four times a day (QID) | ORAL | Status: AC | PRN
Start: 2012-05-19 — End: 2012-05-26

## 2012-05-19 NOTE — Telephone Encounter (Signed)
Call in Antivert 25 mg one po qid #40 start now  Phenergan 25mg  one po qid #40 prn

## 2012-05-19 NOTE — Telephone Encounter (Signed)
Pt calling to report that last Friday she had [facility] by Dr. Henrene Dodgehundhury and ever since she has been feeling nauseated, lightheaded, no appetite, pls advise, pt can be reached at [phone], pt states that after she had the MRI she sat in the lobby feeling very dizzy and has felt that way ever since along with other symptoms mentioned, pt stated that she has not called the ordering dr.

## 2012-05-19 NOTE — Telephone Encounter (Signed)
E-scribed RX's, could not leave message, mailbox full

## 2012-05-19 NOTE — Telephone Encounter (Signed)
Please advise.

## 2012-06-27 NOTE — Progress Notes (Signed)
Subjective:      Patient ID: Linda [name] is a 66 y.o. female.    HPIwith a history of low back arthritis, migraines and osteoporosis who is here for cardiopulmonary evaluation prior to surgery under general by Dr Filbert Schilder on Dec 2 at [facility].  Also Dr Karlene Lineman is working with him at the same time.  PFSH reviewed and updated   Allergies   Allergen Reactions   ??? Actonel (NUU:[id number]+VQ Pigment Blue 63)      Osteonecrosis jaw documented     Current Outpatient Prescriptions   Medication Sig Dispense Refill   ??? meclizine (ANTIVERT) 25 MG tablet Take 1 tablet by mouth 4 times daily as needed for Dizziness.  40 tablet  0   ??? Biotin 5000 MCG CAPS Take  by mouth.       ??? Omega-3 Fatty Acids (FISH OIL) 500 MG CAPS Take 500 mg by mouth 2 times daily.       ??? Calcium Carbonate-Vitamin D (CALTRATE 600+D) 600-400 MG-UNIT CHEW Take 1 tablet by mouth 2 times daily.  60 tablet  11     No current facility-administered medications for this visit.     Past Medical History   Diagnosis Date   ??? Postmenopausal    ??? Dyspepsia      nonulcerative   ??? Colon polyps      adenomatous    ??? Thoracic compression fracture      T10 from a fall   ??? Bisphosphonate-associated osteonecrosis of the jaw    ??? Migraine      recurrent   ??? History of depression    ??? Osteoarthritis of lumbar spine      [place]     Past Surgical History   Procedure Laterality Date   ??? Kyphosis surgery       t10   ??? Shoulder surgery       left humerus fx 3 places     History   Substance Use Topics   ??? Smoking status: Former Smoker     Quit date: 08/06/1997   ??? Smokeless tobacco: Not on file    Comment: passive exposure husband chain smokes   ??? Alcohol Use: 1 - 1.5 oz/week     2-3 drink(s) per week     Family History   Problem Relation Age of Onset   ??? Breast Cancer Sister             Review of Systems  No problem with cp or sob with exertion  Able to climb stairs all the time wo problem  Objective:   Physical Exam   Nursing note and vitals reviewed.  Constitutional: She is  oriented to person, place, and time. She appears well-developed and well-nourished. No distress.   HENT:   Head: Normocephalic and atraumatic.   Right Ear: External ear normal.   Left Ear: External ear normal.   Nose: Nose normal.   Mouth/Throat: Oropharynx is clear and moist.   Has veneers on upper teeth   Eyes: EOM are normal. Pupils are equal, round, and reactive to light. Right eye exhibits no discharge. Left eye exhibits no discharge.   Neck: Normal range of motion. Neck supple. No JVD present. No tracheal deviation present. No thyromegaly present.   Cardiovascular: Normal rate, regular rhythm, normal heart sounds and intact distal pulses.  Exam reveals no gallop.    No murmur heard.  Pulmonary/Chest: Effort normal and breath sounds normal. No stridor. No respiratory distress.   Abdominal:  Soft. Bowel sounds are normal. She exhibits no distension. There is no tenderness.   Musculoskeletal: Normal range of motion. She exhibits no edema.   Lymphadenopathy:     She has no cervical adenopathy.   Neurological: She is alert and oriented to person, place, and time. She has normal reflexes.   Skin: Skin is warm and dry. She is not diaphoretic.   Psychiatric: She has a normal mood and affect. Her behavior is normal. Judgment and thought content normal.       Assessment:     1.preop evaluation:  In stable medical condition check ekg  2.migraines stable  3. Osteoporosis unable to take bisphosphonates only on calcium and D consider Forteo but she wouldn't be able to follow it up with anything        Plan:      Medically stable for surgery

## 2012-09-15 NOTE — Telephone Encounter (Signed)
Left a message with the pt regarding a pneumonia vaccine. If pt calls back and is interested please schedule, if not please document and let me know.

## 2012-11-02 ENCOUNTER — Inpatient Hospital Stay: Admit: 2012-11-02 | Discharge: 2012-11-02 | Attending: Emergency Medicine

## 2012-11-02 MED ORDER — BUTALBITAL-APAP-CAFFEINE 50-325-40 MG PO TABS
50-325-40 MG | ORAL_TABLET | ORAL | Status: DC | PRN
Start: 2012-11-02 — End: 2012-11-11

## 2012-11-02 MED ORDER — ONDANSETRON HCL 4 MG PO TABS
4 MG | ORAL_TABLET | Freq: Three times a day (TID) | ORAL | Status: DC | PRN
Start: 2012-11-02 — End: 2012-11-04

## 2012-11-02 MED ADMIN — LORazepam (ATIVAN) injection 0.5 mg: INTRAVENOUS | @ 22:00:00 | NDC 00641604401

## 2012-11-02 MED ADMIN — metoclopramide (REGLAN) injection 10 mg: INTRAVENOUS | @ 22:00:00 | NDC 00409341401

## 2012-11-02 MED ADMIN — ketorolac (TORADOL) injection 15 mg: INTRAVENOUS | @ 22:00:00 | NDC 00409379501

## 2012-11-02 MED ADMIN — ondansetron (ZOFRAN) injection 4 mg: INTRAVENOUS | @ 22:00:00 | NDC 00409475503

## 2012-11-02 MED ADMIN — 0.9 % sodium chloride bolus: INTRAVENOUS | @ 22:00:00 | NDC 00338004903

## 2012-11-02 MED FILL — KETOROLAC TROMETHAMINE 30 MG/ML IJ SOLN: 30 MG/ML | INTRAMUSCULAR | Qty: 1

## 2012-11-02 MED FILL — ONDANSETRON HCL 4 MG/2ML IJ SOLN: 4 MG/2ML | INTRAMUSCULAR | Qty: 2

## 2012-11-02 MED FILL — METOCLOPRAMIDE HCL 5 MG/ML IJ SOLN: 5 MG/ML | INTRAMUSCULAR | Qty: 2

## 2012-11-02 MED FILL — SODIUM CHLORIDE 0.9 % IV SOLN: 0.9 % | INTRAVENOUS | Qty: 500

## 2012-11-02 MED FILL — LORAZEPAM 2 MG/ML IJ SOLN: 2 MG/ML | INTRAMUSCULAR | Qty: 1

## 2012-11-02 NOTE — ED Notes (Signed)
reglan infused    Iv bolus infused    Lesleigh Noe, RN  11/02/12 [phone]

## 2012-11-02 NOTE — Discharge Instructions (Signed)
Go home and sleep  Use dark glasses as needed for your comfort  Take medications as prescribed  Please followup with your primary care doctor this week  Please return to the emergency department for worsening symptoms    Recurrent Migraine Headache  A migraine headache is an intense, throbbing pain on one or both sides of your head. Recurrent migraines keep coming back. A migraine can last for 30 minutes to several hours.  CAUSES   The exact cause of a migraine headache is not always known. However, a migraine may be caused when nerves in the brain become irritated and release chemicals that cause inflammation. This causes pain.   SYMPTOMS    Pain on one or both sides of your head.   Pulsating or throbbing pain.   Severe pain that prevents daily activities.   Pain that is aggravated by any physical activity.   Nausea, vomiting, or both.   Dizziness.   Pain with exposure to bright lights, loud noises, or activity.   General sensitivity to bright lights, loud noises, or smells.  Before you get a migraine, you may get warning signs that a migraine is coming (aura). An aura may include:   Seeing flashing lights.   Seeing bright spots, halos, or zig-zag lines.   Having tunnel vision or blurred vision.   Having feelings of numbness or tingling.   Having trouble talking.   Having muscle weakness.  MIGRAINE TRIGGERS  Examples of triggers of migraine headaches include:    Alcohol.   Smoking.   Stress.   Menstruation.   Aged cheeses.   Foods or drinks that contain nitrates, glutamate, aspartame, or tyramine.   Lack of sleep.   Chocolate.   Caffeine.   Hunger.   Physical exertion.   Fatigue.   Medicines used to treat chest pain (nitroglycerine), birth control pills, estrogen, and some blood pressure medicines.  DIAGNOSIS   A recurrent migraine headache is often diagnosed based on:   Symptoms.   Physical examination.   A CT scan or MRI of your head.  TREATMENT   Medicines may be given for pain and  nausea. Medicines can also be given to help prevent recurrent migraines.  HOME CARE INSTRUCTIONS   Only take over-the-counter or prescription medicines for pain or discomfort as directed by your caregiver. The use of long-term narcotics is not recommended.   Lie down in a dark, quiet room when you have a migraine.   Keep a journal to find out what may trigger your migraine headaches. For example, write down:   What you eat and drink.   How much sleep you get.   Any change to your diet or medicines.   Limit alcohol consumption.   Quit smoking if you smoke.   Get 7 to 9 hours of sleep, or as recommended by your caregiver.   Limit stress.   Keep lights dim if bright lights bother you and make your migraines worse.  SEEK MEDICAL CARE IF:    You do not get relief from the medicines given to you.   You have a recurrence of pain.  SEEK IMMEDIATE MEDICAL CARE IF:   Your migraine becomes severe.   You have a fever.   You have a stiff neck.   You have loss of vision.   You have muscular weakness or loss of muscle control.   You start losing your balance or have trouble walking.   You feel faint or pass out.   You have  severe symptoms that are different from your first symptoms.  MAKE SURE YOU:    Understand these instructions.   Will watch your condition.   Will get help right away if you are not doing well or get worse.  Document Released: 04/17/2001 Document Revised: 10/15/2011 Document Reviewed: 07/13/2011  [facility] Patient Information 2013 [place].    Recurrent Migraine Headache  A migraine headache is very bad, throbbing pain on one or both sides of your head. Recurrent migraines keep coming back. Talk to your doctor about what things may bring on (trigger) your migraine headaches.  HOME CARE   Only take medicines as told by your doctor.   Lie down in a dark, quiet room when you have a migraine.   Keep a journal to find out if certain things bring on migraine headaches. For example,  write down:   What you eat and drink.   How much sleep you get.   Any change to your diet or medicines.   Lessen how much alcohol you drink.   Quit smoking if you smoke.   Get enough sleep.   Lessen any stress in your life.   Keep lights dim if bright lights bother you or make your migraines worse.  GET HELP RIGHT AWAY IF:    Your migraine becomes really bad.   You have a fever.   You have a stiff neck.   You have trouble seeing.   Your muscles are weak, or you lose muscle control.   You lose your balance or have trouble walking.   You feel like you will pass out (faint), or you pass out.   You have really bad symptoms that are different than your first symptoms.   Medicine does not help your migraines.   Your pain keeps coming back.  MAKE SURE YOU:    Understand these instructions.   Will watch your condition.   Will get help right away if you are not doing well or get worse.  Document Released: 05/01/2008 Document Revised: 10/15/2011 Document Reviewed: 07/13/2011  [facility] Patient Information 2013 [place].

## 2012-11-02 NOTE — ED Notes (Signed)
C/o abd pain started on Wed no vomitting or diarrhea    Rolm Gala, RN  11/02/12 1659

## 2012-11-02 NOTE — ED Provider Notes (Signed)
[facility]    Pt Name: Linda [name]  MRN: [id number]  [place] 11/13/1945  Date of evaluation: 11/02/2012  Provider: Edmonia Lynch, MD, MPH      CRITICAL CARE:  None    CHIEF COMPLAINT  Chief Complaint   Patient presents with   ??? Headache     started on Friday  nausea  light sensity     HPI  Linda [name] is a 67 y.o. female who presents because of headache.  Patient has a history of chronic intermittent migraine and states that her last migraine headache was a few months ago. She states that this headache began approximately 3 days ago and has not taken anything at home, hoping it would just go away with rest. The patient has not seen her primary care provider for this headache. She states that her headache is consistent with previous migraine she has had in the past. She is nauseated with intermittent vomiting, photophobic and has a left-sided headache. She denies any other visual disturbance, bowel or bladder disturbance, neck pain, chest pain, shortness of breath, extremity pain, recent trauma, and other systemic symptoms.    REVIEW OF SYSTEMS:  General/Neuro:  No fever, no congestion  Chest/Resp:  No chest pain  GI/GU:  No abdominal pain  See HPI for further details. Remainder of review of systems reviewed and negative.  Nursing notes reviewed.    PAST MEDICAL HISTORY  Past Medical History   Diagnosis Date   ??? Postmenopausal    ??? Dyspepsia      nonulcerative   ??? Colon polyps      adenomatous    ??? Thoracic compression fracture      T10 from a fall   ??? Bisphosphonate-associated osteonecrosis of the jaw    ??? Migraine      recurrent   ??? History of depression    ??? Osteoarthritis of lumbar spine      [place]     SURGICAL HISTORY  Past Surgical History   Procedure Laterality Date   ??? Kyphosis surgery       t10   ??? Shoulder surgery       left humerus fx 3 places     MEDICATIONS:  No current facility-administered medications on file prior to encounter.     Current  Outpatient Prescriptions on File Prior to Encounter   Medication Sig Dispense Refill   ??? Red Yeast Rice Extract (RED YEAST RICE PO) Take 600 mcg/day by mouth daily.       ??? butalbital-acetaminophen-caffeine (FIORICET) per tablet Take 1 tablet by mouth every 4 hours as needed for Pain for 20 doses. No alcohol or driving with this medication  20 tablet  0   ??? Biotin 5000 MCG CAPS Take  by mouth.       ??? Omega-3 Fatty Acids (FISH OIL) 500 MG CAPS Take 500 mg by mouth 2 times daily.       ??? Calcium Carbonate-Vitamin D (CALTRATE 600+D) 600-400 MG-UNIT CHEW Take 1 tablet by mouth 2 times daily.  60 tablet  11   ??? meclizine (ANTIVERT) 25 MG tablet Take 1 tablet by mouth 4 times daily as needed for Dizziness.  40 tablet  0     ALLERGIES  Actonel  FAMILY HISTORY:    Family History   Problem Relation Age of Onset   ??? Breast Cancer Sister      SOCIAL HISTORY:  History   Substance Use Topics   ???  Smoking status: Former Smoker     Quit date: 08/06/1997   ??? Smokeless tobacco: Not on file      Comment: passive exposure husband chain smokes   ??? Alcohol Use: 1 - 1.5 oz/week     2-3 drink(s) per week     IMMUNIZATIONS:  Noncontributory    PHYSICAL EXAM  VITAL SIGNS:  BP 130/78   Pulse 65   Temp(Src) 98.3 ??F (36.8 ??C)   Resp 20   Ht 5' 4"$  (1.626 m)   Wt 120 lb (54.432 kg)   BMI 20.59 kg/m2  Constitutional:  67 y.o. female alert, cooperative, nontoxic; obviously uncomfortable due to headache  HENT:  Atraumatic, mucous membranes moist, no tenderness at temples, no facial swelling  Eyes:   Conjunctiva clear, no discharge, no icterus; fundi are well seen without papilledema or hemorrhages  Neck:  Supple, no adenopathy, no JVD, no meningeal signs, no bruits  Cardiovascular:  Regular, no rubs, no discernible murmur  Thorax & Lungs:  No accessory muscle usage, clear  Abdomen:  Soft, non distended, no tenderness  Back:  No deformity  Genitalia:  Deferred  Rectal:  Deferred  Extremities:  No cyanosis, no edema  Skin:  Warm, dry, no rash of  trunk or extremities  Neurologic:  Alert & oriented, no slurred speech, CN function intact, PERRL, no nystagmus, coordination normal, no focal deficits; motor strength 5 over 5 and equal bilaterally  Psychiatric:  Affect appropriate    DIAGNOSTIC RESULTS:  None indicated  RADIOLOGY:  None    ED COURSE:  Patient arrived to the emergency department with acute exacerbation of her chronic migraine headaches. She received some medication and fluids here in the emergency department including Reglan, Toradol, Ativan, and Zofran. Subsequent to this, the patient feels considerably better and is discharged home in good condition in the care of her significant other. She is advised to go home and get plenty of rest and is given a prescription for Fioricet and Zofran. She is advised also to follow up with her primary care provider this week and return to the emergency department for worsening symptoms.     PROCEDURES:  None    CRITICAL CARE:  None    CONSULTATIONS:  None    MEDICAL DECISION MAKING: Linda [name] is a 67 y.o. female who presented because of a migraine headache.  The patient is feeling improved.  The patient does not have any clinical exam findings that would suggest need for emergent imaging or CSF studies.  We advised the patient to return to the ER if there is any significant worsening of the headache, vomiting, fever or other new concerns.    Differential Diagnosis:  Meningitis, SAH, thrombosis, temporal arteritis, acute angle glaucoma, tumor with mass effect, CO poisoning, other    FINAL IMPRESSION:    1. Migraine    Angelica Pou, MD  [place] [address]  [phone]    Call today  Please call to be seen within the week        (Please note that I used voice recognition software to generate this note.  Occasionally words are mistranscribed despite my efforts to edit errors.)      Edmonia Lynch, MD  11/02/12 [phone]

## 2012-11-04 ENCOUNTER — Encounter

## 2012-11-04 LAB — CBC WITH AUTO DIFFERENTIAL
Basophils %: 0.3 %
Basophils Absolute: 0 10*3/uL (ref 0.0–0.2)
Eosinophils %: 2.2 %
Eosinophils Absolute: 0.1 10*3/uL (ref 0.0–0.6)
Hematocrit: 42.4 % (ref 36.0–48.0)
Hemoglobin: 13.6 g/dL (ref 12.0–16.0)
Lymphocytes %: 34.7 %
Lymphocytes Absolute: 2.2 10*3/uL (ref 1.0–5.1)
MCH: 29.5 pg (ref 26.0–34.0)
MCHC: 31.9 g/dL (ref 31.0–36.0)
MCV: 92.3 fL (ref 80.0–100.0)
MPV: 8.3 fL (ref 5.0–10.5)
Monocytes %: 5.9 %
Monocytes Absolute: 0.4 10*3/uL (ref 0.0–1.3)
Neutrophils %: 56.9 %
Neutrophils Absolute: 3.6 10*3/uL (ref 1.7–7.7)
Platelets: 316 10*3/uL (ref 135–450)
RBC: 4.59 M/uL (ref 4.00–5.20)
RDW: 14.7 % (ref 12.4–15.4)
WBC: 6.3 10*3/uL (ref 4.0–11.0)

## 2012-11-04 MED ORDER — PREDNISONE 5 MG PO TABS
5 MG | ORAL_TABLET | ORAL | Status: DC
Start: 2012-11-04 — End: 2012-11-11

## 2012-11-04 NOTE — Telephone Encounter (Signed)
Olegario MessierKathy from the pharmacy is calling to verify directions.  She wants to make sure you want the pt to take the 12 tabs today and taper down one until gone.

## 2012-11-04 NOTE — Telephone Encounter (Signed)
lmom for pharm

## 2012-11-04 NOTE — Progress Notes (Signed)
Subjective:      Patient ID: Linda [name] is a 67 y.o. female.    HPIwith a history of recurrent migraines who reports a four day migraine.  Prior to that had a uri followed by gi upset, nausea, only two days of diarrhea, no recent bm, chronically constipated.  Loosing wght.  Is pushing fluids. Eating brats diet only  Head hurts right temple.  Went to [facility].  Had blood work.  Just got ivf's and a script fioricet.      Review of Systems  No dysuria, weakness, jittery,  Fuzzy in the head, stopped the fioricet no help, belching,   Objective:   Physical Exam   Nursing note and vitals reviewed.  Constitutional: She is oriented to person, place, and time. She appears well-developed and well-nourished. No distress.   HENT:   Head: Normocephalic and atraumatic.   Right Ear: External ear normal.   Left Ear: External ear normal.   Nose: Nose normal.   Mouth/Throat: Oropharynx is clear and moist.   Eyes: EOM are normal. Pupils are equal, round, and reactive to light. Right eye exhibits no discharge. Left eye exhibits no discharge.   Neck: Normal range of motion. Neck supple. No JVD present. No tracheal deviation present. No thyromegaly present.   Cardiovascular: Normal rate, regular rhythm, normal heart sounds and intact distal pulses.  Exam reveals no gallop.    No murmur heard.  Pulmonary/Chest: Effort normal and breath sounds normal. No stridor. No respiratory distress.   Abdominal: Soft. Bowel sounds are normal. She exhibits no distension. There is no tenderness.   Musculoskeletal: Normal range of motion. She exhibits no edema.   Lymphadenopathy:     She has no cervical adenopathy.   Neurological: She is alert and oriented to person, place, and time. She has normal reflexes.   Tender over right temple  No bruits over temples or carotids  Full neuro exam nonfocal   Skin: Skin is warm and dry. She is not diaphoretic.   Psychiatric: She has a normal mood and affect. Her behavior is normal. Judgment and thought  content normal.       Assessment:    status migrain  Dyspepsia  depression        Plan:    check labs try prednisone taper

## 2012-11-04 NOTE — Telephone Encounter (Signed)
yes

## 2012-11-05 ENCOUNTER — Encounter

## 2012-11-05 LAB — COMPREHENSIVE METABOLIC PANEL
ALT: 26 U/L (ref 10–40)
AST: 32 U/L (ref 15–37)
Albumin/Globulin Ratio: 1.5 (ref 1.1–2.2)
Albumin: 4.9 g/dL (ref 3.4–5.0)
Alkaline Phosphatase: 127 U/L (ref 40–129)
BUN: 11 mg/dL (ref 7–20)
CO2: 26 mmol/L (ref 21–32)
Calcium: 9.9 mg/dL (ref 8.3–10.6)
Chloride: 99 mmol/L (ref 99–110)
Creatinine: 0.6 mg/dL (ref 0.6–1.2)
GFR African American: >60 (ref >60)
GFR Non-African American: >60 (ref >60)
Globulin: 3.3 g/dL
Glucose: 97 mg/dL (ref 70–99)
Potassium: 4.2 mmol/L (ref 3.5–5.1)
Sodium: 138 mmol/L (ref 136–145)
Total Bilirubin: 0.5 mg/dL (ref 0.0–1.0)
Total Protein: 8.2 g/dL (ref 6.4–8.2)

## 2012-11-05 LAB — C-REACTIVE PROTEIN: CRP: 1.5 mg/L (ref 0.0–5.1)

## 2012-11-05 LAB — TSH: TSH: 1.89 u[IU]/mL (ref 0.27–4.20)

## 2012-11-05 LAB — LIPID PANEL
Cholesterol, Total: 342 mg/dL — ABNORMAL HIGH (ref 0–199)
HDL: 47 mg/dL (ref 40–60)
LDL Calculated: 272 mg/dL — ABNORMAL HIGH (ref <100)
Triglycerides: 116 mg/dL (ref 0–150)
VLDL Cholesterol Calculated: 23 mg/dL

## 2012-11-05 LAB — SEDIMENTATION RATE: Sed Rate: 47 mm/Hr — ABNORMAL HIGH (ref 0–30)

## 2012-11-06 MED ORDER — GADOVERSETAMIDE 330.9 MG/ML IV SOLN
330.9 MG/ML | Freq: Once | INTRAVENOUS | Status: AC | PRN
Start: 2012-11-06 — End: 2012-11-06
  Administered 2012-11-06: 12:00:00 via INTRAVENOUS

## 2012-11-10 MED ORDER — ROSUVASTATIN CALCIUM 20 MG PO TABS
20 MG | ORAL_TABLET | Freq: Every day | ORAL | Status: DC
Start: 2012-11-10 — End: 2012-11-11

## 2012-11-11 MED ORDER — RIZATRIPTAN BENZOATE 10 MG PO TABS
10 MG | ORAL_TABLET | Freq: Once | ORAL | Status: DC | PRN
Start: 2012-11-11 — End: 2013-07-10

## 2012-11-11 MED ORDER — ATORVASTATIN CALCIUM 40 MG PO TABS
40 MG | ORAL_TABLET | Freq: Every day | ORAL | Status: DC
Start: 2012-11-11 — End: 2013-03-27

## 2012-11-11 MED ORDER — TOPIRAMATE 25 MG PO TABS
25 MG | ORAL_TABLET | ORAL | Status: DC
Start: 2012-11-11 — End: 2012-12-02

## 2012-11-11 NOTE — Progress Notes (Signed)
Subjective:      Patient ID: Linda [name] is a 67 y.o. female.    HPI  with a history of recurrent migraines who reports a four day migraine.  Prior to that had a uri followed by gi upset, nausea, only two days of diarrhea, no recent bm, chronically constipated.  Loosing wght.  Is pushing fluids. Eating brats diet only  Head hurts right temple.  Went to [facility].  Had blood work.  Just got ivf's and a script fioricet.      Here for fu on headaches, head feels fuzzy, pain is on the right side of the head.  MRI negative.  Repeat esr never done.  Has a prior history of migraines doesn't feel that now.  Nausea, phonophobia and photophobia.  Husband's sugar is high.  She has been on imitrex and maxalt in the past.  Was on prophy.     Review of Systems    No dysuria, weakness, jittery,  Fuzzy in the head, stopped the fioricet no help, belching,   Objective:   Physical Exam   Nursing note and vitals reviewed.  Constitutional: She is oriented to person, place, and time. She appears well-developed and well-nourished. No distress.   HENT:   Head: Normocephalic and atraumatic.   Right Ear: External ear normal.   Left Ear: External ear normal.   Nose: Nose normal.   Mouth/Throat: Oropharynx is clear and moist.   Eyes: EOM are normal. Pupils are equal, round, and reactive to light. Right eye exhibits no discharge. Left eye exhibits no discharge.   Neck: Normal range of motion. Neck supple. No JVD present. No tracheal deviation present. No thyromegaly present.   Cardiovascular: Normal rate, regular rhythm, normal heart sounds and intact distal pulses.  Exam reveals no gallop.    No murmur heard.  Pulmonary/Chest: Effort normal and breath sounds normal. No stridor. No respiratory distress.   Abdominal: Soft. Bowel sounds are normal. She exhibits no distension. There is no tenderness.   Musculoskeletal: Normal range of motion. She exhibits no edema.   Lymphadenopathy:     She has no cervical adenopathy.   Neurological: She  is alert and oriented to person, place, and time. She has normal reflexes.   Tender over right temple  No bruits over temples or carotids  Full neuro exam nonfocal   Skin: Skin is warm and dry. She is not diaphoretic.   Psychiatric: She has a normal mood and affect. Her behavior is normal. Judgment and thought content normal.     Looks pale  Tender over both temples  Neck supple  Neuro nonfocal  Assessment:    status migrain  Dyspepsia  depression     1, status migraine suspected:  Recheck esr and try topamax and maxalt  2. Depression: needs something  Plan:    check labs try prednisone taper

## 2012-11-11 NOTE — Telephone Encounter (Signed)
Please advise.

## 2012-11-11 NOTE — Telephone Encounter (Signed)
Pt would like a call back regarding test strips and stating the migrain medication the pharmacy gave 9 pill and not 30. Please advise    Pt# [phone]  lov- [birth year]

## 2012-11-11 NOTE — Telephone Encounter (Signed)
Unfortunately that's all they will give per month  If she needs help on test strips can come in see vicky

## 2012-11-11 NOTE — Telephone Encounter (Signed)
Tried calling- mailbox full unable to leave message.   Will try again later.

## 2012-11-12 LAB — SEDIMENTATION RATE: Sed Rate: 44 mm/Hr — ABNORMAL HIGH (ref 0–30)

## 2012-12-02 MED ORDER — KETOROLAC TROMETHAMINE 60 MG/2ML IM SOLN
60 MG/2ML | Freq: Once | INTRAMUSCULAR | Status: AC
Start: 2012-12-02 — End: 2012-12-02

## 2012-12-02 MED ORDER — TOPIRAMATE 25 MG PO TABS
25 MG | ORAL_TABLET | ORAL | Status: DC
Start: 2012-12-02 — End: 2012-12-25

## 2012-12-02 NOTE — Progress Notes (Signed)
Subjective:      Patient ID: Linda [name] is a 67 y.o. female.    HPIHere for follow up on headaches she is crying everyday very upset.  She is upset over her husbands diseases.  She has contd photophonophobia.      Review of Systems    Objective:   Physical Exam    Assessment:      Chronic migraine       Plan:      Reiterate topamax  Try relpax and tordol

## 2012-12-22 ENCOUNTER — Inpatient Hospital Stay: Admit: 2012-12-22 | Discharge: 2012-12-22 | Attending: Emergency Medicine

## 2012-12-22 LAB — CBC WITH AUTO DIFFERENTIAL
Basophils %: 0.5 %
Basophils Absolute: 0 10*3/uL (ref 0.0–0.2)
Eosinophils %: 1.4 %
Eosinophils Absolute: 0.1 10*3/uL (ref 0.0–0.6)
Hematocrit: 35.9 % — ABNORMAL LOW (ref 36.0–48.0)
Hemoglobin: 12 g/dL (ref 12.0–16.0)
Lymphocytes %: 33.1 %
Lymphocytes Absolute: 2.1 10*3/uL (ref 1.0–5.1)
MCH: 30.6 pg (ref 26.0–34.0)
MCHC: 33.5 g/dL (ref 31.0–36.0)
MCV: 91.2 fL (ref 80.0–100.0)
MPV: 8 fL (ref 5.0–10.5)
Monocytes %: 4.8 %
Monocytes Absolute: 0.3 10*3/uL (ref 0.0–1.3)
Neutrophils %: 60.2 %
Neutrophils Absolute: 3.8 10*3/uL (ref 1.7–7.7)
Platelets: 259 10*3/uL (ref 135–450)
RBC: 3.93 M/uL — ABNORMAL LOW (ref 4.00–5.20)
RDW: 15.9 % — ABNORMAL HIGH (ref 12.4–15.4)
WBC: 6.3 10*3/uL (ref 4.0–11.0)

## 2012-12-22 LAB — BASIC METABOLIC PANEL
BUN: 10 mg/dL (ref 7–20)
CO2: 22 mmol/L (ref 21–32)
Calcium: 10.3 mg/dL (ref 8.3–10.6)
Chloride: 108 mmol/L (ref 99–110)
Creatinine: 0.6 mg/dL (ref 0.6–1.2)
GFR African American: >60 (ref >60)
GFR Non-African American: >60 (ref >60)
Glucose: 85 mg/dL (ref 70–99)
Potassium: 3.1 mmol/L — ABNORMAL LOW (ref 3.5–5.1)
Sodium: 141 mmol/L (ref 136–145)

## 2012-12-22 LAB — C-REACTIVE PROTEIN: CRP: 0.4 mg/L (ref 0.0–5.1)

## 2012-12-22 LAB — SEDIMENTATION RATE: Sed Rate: 43 mm/Hr — ABNORMAL HIGH (ref 0–30)

## 2012-12-22 MED ADMIN — promethazine (PHENERGAN) 12.5mg in sodium chloride 0.9% 50 mL IVPB 12.5 mg: 12.5 mg | INTRAVENOUS | @ 20:00:00 | NDC 09999990755

## 2012-12-22 MED ADMIN — HYDROmorphone HCl PF (DILAUDID) injection SOLN 1 mg: 1 mg | INTRAVENOUS | @ 20:00:00 | NDC 00409128331

## 2012-12-22 MED ADMIN — 0.9 % sodium chloride bolus: 1000 mL | INTRAVENOUS | @ 20:00:00 | NDC 00338004904

## 2012-12-22 MED ADMIN — ketorolac (TORADOL) injection 30 mg: INTRAMUSCULAR | @ 17:00:00 | NDC 00641604301

## 2012-12-22 MED ADMIN — SUMAtriptan (IMITREX) injection 6 mg: 6 mg | SUBCUTANEOUS | @ 17:00:00 | NDC 64679072801

## 2012-12-22 MED ADMIN — HYDROmorphone HCl PF (DILAUDID) injection SOLN 1 mg: 1 mg | INTRAVENOUS | @ 22:00:00 | NDC 00409128331

## 2012-12-22 MED FILL — HYDROMORPHONE HCL PF 1 MG/ML IJ SOLN: 1 MG/ML | INTRAMUSCULAR | Qty: 1

## 2012-12-22 MED FILL — PROMETHAZINE (PHENERGAN) 12.5 MG 50 ML IVPB: 12.5 mg/50 mL | INTRAVENOUS | Qty: 50

## 2012-12-22 MED FILL — SODIUM CHLORIDE 0.9 % IV SOLN: 0.9 % | INTRAVENOUS | Qty: 1000

## 2012-12-22 NOTE — ED Provider Notes (Signed)
[facility] EMERGENCY DEPT  eMERGENCY dEPARTMENT eNCOUnter      Pt Name: Linda [name]  MRN: [id number]  Birthdate 10-01-1945  Date of evaluation: 12/22/2012  Provider: Prince Rome, PA    CHIEF COMPLAINT       Chief Complaint   Patient presents with   ??? Headache     migraine, 6-7weeks 9/10 pain. here per dr sax         HISTORY OF PRESENT ILLNESS  (Location/Symptom, Timing/Onset, Context/Setting, Quality, Duration, Modifying Factors, Severity.)   Linda [name] is a 67 y.o. female who presents to the emergency department With complaint of almost daily headaches for the past 7 weeks she has been followed by her primary care provider and did receive an evaluation this morning and was sent over per Dr. Chelsea Aus for further evaluation. Patient describes a frontal throbbing headache with photosensitivity more so on the right. No nausea or vomiting no neck pain no fevers or chills. Patient has history of migraines with thorough neurologic workup recent MRI scan obtained April 3 -benign.      Nursing Notes were reviewed.    REVIEW OF SYSTEMS    (2-9 systems for level 4, 10 or more for level 5)     Constitutional negative for fever  Neck supple no meningismus  Cardiovascular negative for chest pain  Pulmonary negative for cough  GI negative for nausea and vomiting  Neuro positive for headache  Except as noted above the remainder of the review of systems was reviewed and negative.       PAST MEDICAL HISTORY         Diagnosis Date   ??? Postmenopausal    ??? Dyspepsia      nonulcerative   ??? Colon polyps      adenomatous    ??? Thoracic compression fracture      T10 from a fall   ??? Bisphosphonate-associated osteonecrosis of the jaw    ??? Migraine      recurrent   ??? History of depression    ??? Osteoarthritis of lumbar spine      [place]       SURGICAL HISTORY           Procedure Laterality Date   ??? Kyphosis surgery       t10   ??? Shoulder surgery       left humerus fx 3 places       CURRENT MEDICATIONS       Discharge Medication  List as of 12/22/2012  7:17 PM      CONTINUE these medications which have NOT CHANGED    Details   topiramate (TOPAMAX) 25 MG tablet One po bid for a week then increase to two po bid until rechecked to prevent migraines must take daily, Disp-120 tablet, R-3      atorvastatin (LIPITOR) 40 MG tablet Take 1 tablet by mouth daily., Disp-30 tablet, R-1      rizatriptan (MAXALT) 10 MG tablet Take 1 tablet by mouth once as needed for Migraine for 1 dose. May repeat in 2 hours if needed, Disp-30 tablet, R-3      Red Yeast Rice Extract (RED YEAST RICE PO) Take 600 mcg/day by mouth daily.      Biotin 5000 MCG CAPS Take  by mouth.      Omega-3 Fatty Acids (FISH OIL) 500 MG CAPS Take 500 mg by mouth 2 times daily.      Calcium Carbonate-Vitamin D (CALTRATE 600+D) 600-400 MG-UNIT CHEW Take  1 tablet by mouth 2 times daily., Disp-60 tablet, R-11             ALLERGIES     Actonel    FAMILY HISTORY           Problem Relation Age of Onset   ??? Breast Cancer Sister      No family status information on file.        SOCIAL HISTORY      reports that she quit smoking about 15 years ago. She has never used smokeless tobacco. She reports that she does not drink alcohol or use illicit drugs.    PHYSICAL EXAM    (up to 7 for level 4, 8 or more for level 5)   ED Triage Vitals   BP Temp Temp Source Pulse Resp SpO2 Height Weight   12/22/12 1455 12/22/12 1455 12/22/12 1455 12/22/12 1455 12/22/12 1455 12/22/12 1455 12/22/12 1455 12/22/12 1455   151/90 mmHg 98.2 ??F (36.8 ??C) Oral 74 18 98 % 5\' 4"  (1.626 m) 123 lb (55.792 kg)       Physical Exam   Constitutional: She is oriented to person, place, and time. She appears well-developed and well-nourished.   HENT:   Head: Normocephalic and atraumatic.   Nose: Nose normal.   Diffuse tenderness to the right temporal region pupils are equal round and reactive to light extraocular movements are intact no facial swelling or asymmetry   Eyes: Conjunctivae and EOM are normal. Pupils are equal, round, and  reactive to light. Right eye exhibits no discharge. Left eye exhibits no discharge.   Neck: Normal range of motion. Neck supple. No JVD present.   No meningismus negative Kernig's negative Brudzinski   Cardiovascular: Normal rate, regular rhythm and normal heart sounds.  Exam reveals no gallop and no friction rub.    No murmur heard.  Pulmonary/Chest: Effort normal and breath sounds normal. No respiratory distress. She has no wheezes. She has no rales. She exhibits no tenderness.   Musculoskeletal: Normal range of motion.   Lymphadenopathy:     She has no cervical adenopathy.   Neurological: She is alert and oriented to person, place, and time.   Cranial nerves II through XII grossly intact no focal deficits noted no pronator drift good rapid alternating negative Romberg gait nonantalgic   Skin: Skin is warm and dry. She is not diaphoretic.   Psychiatric: She has a normal mood and affect. Her behavior is normal.           DIAGNOSTIC RESULTS     RADIOLOGY:   Non-plain film images such as CT, Ultrasound and MRI are read by the radiologist. Plain radiographic images are visualized and preliminarily interpreted by the emergency physician with the below findings:        Interpretation per the Radiologist below, if available at the time of this note:         LABS:  Labs Reviewed   CBC WITH AUTO DIFFERENTIAL - Abnormal; Notable for the following:     RBC 3.93 (*)     Hematocrit 35.9 (*)     RDW 15.9 (*)     All other components within normal limits   BASIC METABOLIC PANEL - Abnormal; Notable for the following:     Potassium 3.1 (*)     All other components within normal limits   SEDIMENTATION RATE - Abnormal; Notable for the following:     Sed Rate 43 (*)     All other components within  normal limits   C-REACTIVE PROTEIN       All other labs were within normal range or not returned as of this dictation.    EMERGENCY DEPARTMENT COURSE and DIFFERENTIAL DIAGNOSIS/MDM:   Vitals:    Filed Vitals:    12/22/12 1455 12/22/12 1509  12/22/12 1923   BP: 151/90 117/91 126/81   Pulse: 74 80 70   Temp: 98.2 ??F (36.8 ??C) 97.9 ??F (36.6 ??C)    TempSrc: Oral Oral    Resp: 18 18 16    Height: 5\' 4"  (1.626 m) 5\' 4"  (1.626 m)    Weight: 123 lb (55.792 kg) 123 lb (55.792 kg)    SpO2: 98% 100% 100%       Patient is a 67 year old female presenting with a chronic headaches over the past 6-7 weeks. She is sent over by her primary care provider for lab evaluation and repeat sedimentation rate, labs showed no leukocytosis no elevation of sedimentation rate no elevated CRP. Patient's pain was managed with Dilaudid fluids and Phenergan with good pain relief. She does have a plan in place for followup. I did call patient's primary care provider we will discuss her Topamax therapy and have her followup as described above. She is to return with any worsening headache double vision fever or other concern.I estimate there is LOW risk for SUBARACHNOID HEMORRHAGE, MENINGITIS, INTRACRANIAL HEMORRHAGE, SUBDURAL OR EPIDURAL HEMATOMA, OR STROKE, thus I consider the discharge disposition reasonable. The patient and/or family and I have discussed the diagnosis and risks, and we agree with discharging home to follow-up with their primary doctor. We also discussed returning to the Emergency Department immediately if new or worsening symptoms occur. We have discussed the symptoms which are most concerning (e.g., changing or worsening pain, weakness, vomiting, fever) that necessitate immediate return.          PROCEDURES:  None    FINAL IMPRESSION      1. Chronic headache          DISPOSITION/PLAN   DISPOSITION Decision to Discharge    PATIENT REFERRED TO:  Ritta Slot, MD  [address]  [place] [address]  [phone]      As scheduled    [facility]  [address] [place] [address]  [phone]    return if symptoms worsen      DISCHARGE MEDICATIONS:  Discharge Medication List as of 12/22/2012  7:17 PM          (Please note that portions of this note were  completed with a voice recognition program.  Efforts were made to edit the dictations but occasionally words are mis-transcribed.)    Prince Rome, PA  Attending Emergency Physician      Prince Rome, PA  12/23/12 [phone]

## 2012-12-22 NOTE — ED Notes (Signed)
Pt. Discharged to Home, pain level 0 Remains A&Ox4, HPL out, catheter intact, AVS reviewed and given to patient, patient verbalized understanding, out of room to exit doors.    Blondell Reveal, RN  12/22/12 1925

## 2012-12-22 NOTE — ED Notes (Signed)
Denies pain relief    Alvan Dame, RN  12/22/12 1730

## 2012-12-22 NOTE — ED Notes (Addendum)
She has had headaches for 6 to 7 weeks.  Has seen her PMD several times.  Has also been to Emergency Depts too.  Headaches continue.  Had MRI 11/06/12 here at [facility] hospital.    Horald Pollen, RN  12/22/12 1516    Horald Pollen, RN  12/22/12 [phone]

## 2012-12-22 NOTE — Progress Notes (Signed)
Subjective:      Patient ID: Linda [name] is a 67 y.o. female.    HPI  Reports severe headaches 5/7 days per week with photophbia and nausea.  Started always over right temporal area now more lateralized.  MRI showed wmd and esrs x two have been in the 40s.  She thinks prednisone helped temporarily, but the toradol , phenergan and imitrex im have done nothing.  Topamax has not helped at all.  Review of Systems    Objective:   Physical Exam  Wearing sunglasses looks pale very uncomfortable  Assessment:      Headaches, in a patient with a prior history of migraines who is now postmenopausal and hasnt had headaches  For years.  Will send to ER to help her get out of pain and recheck esr        Plan:      Would proceed with temporal art biopsy this week

## 2012-12-22 NOTE — Discharge Instructions (Signed)
General Headache Without Cause  A headache is pain or discomfort felt around the head or neck area. The specific cause of a headache may not be found. There are many causes and types of headaches. A few common ones are:   Tension headaches.   Migraine headaches.   Cluster headaches.   Chronic daily headaches.  HOME CARE INSTRUCTIONS    Keep all follow-up appointments with your caregiver or any specialist referral.   Only take over-the-counter or prescription medicines for pain or discomfort as directed by your caregiver.   Lie down in a dark, quiet room when you have a headache.   Keep a headache journal to find out what may trigger your migraine headaches. For example, write down:   What you eat and drink.   How much sleep you get.   Any change to your diet or medicines.   Try massage or other relaxation techniques.   Put ice packs or heat on the head and neck. Use these 3 to 4 times per day for 15 to 20 minutes each time, or as needed.   Limit stress.   Sit up straight, and do not tense your muscles.   Quit smoking if you smoke.   Limit alcohol use.   Decrease the amount of caffeine you drink, or stop drinking caffeine.   Eat and sleep on a regular schedule.   Get 7 to 9 hours of sleep, or as recommended by your caregiver.   Keep lights dim if bright lights bother you and make your headaches worse.  SEEK MEDICAL CARE IF:    You have problems with the medicines you were prescribed.   Your medicines are not working.   You have a change from the usual headache.   You have nausea or vomiting.  SEEK IMMEDIATE MEDICAL CARE IF:    Your headache becomes severe.   You have a fever.   You have a stiff neck.   You have loss of vision.   You have muscular weakness or loss of muscle control.   You start losing your balance or have trouble walking.   You feel faint or pass out.   You have severe symptoms that are different from your first symptoms.  MAKE SURE YOU:    Understand these  instructions.   Will watch your condition.   Will get help right away if you are not doing well or get worse.  Document Released: 07/23/2005 Document Revised: 10/15/2011 Document Reviewed: 08/08/2011  [employer] Patient Information 2014 [employer].

## 2012-12-22 NOTE — ED Provider Notes (Signed)
Linda [name] is a 66 y.o. female who presents with a complaint of   Chief Complaint   Patient presents with   ??? Headache     migraine, 6-7weeks 9/10 pain. here per dr Eliseo Squires   .    On my exam:  Vital Signs: BP 117/91   Pulse 80   Temp(Src) 97.9 ??F (36.6 ??C) (Oral)   Resp 18   Ht 5\' 4"  (1.626 m)   Wt 123 lb (55.792 kg)   BMI 21.1 kg/m2   SpO2 100%  General: Patient appears non-toxic    HEENT: Minimal diffuse tenderness in the right temporal region. Pupils equal round and reactive to light. Extraocular movements are intact. No facial asymmetry.  Heart: Regular rate and rhythm. No murmurs gallops noted.  Lungs: Breath sounds equal bilaterally and clear.  Neuro: Awake and alert. No focal motor deficits. No cranial nerve deficits.    This patient was seen by the mid-level provider.  I have seen and examined the patient face to face, agree with the workup, evaluation, management and diagnosis.  Care plan has been discussed.      Electronically signed by: Thayer Headings, MD, 12/22/2012  4:50 PM      Thayer Headings, MD  12/23/12 0040

## 2012-12-23 ENCOUNTER — Observation Stay: Admit: 2012-12-23 | Source: Ambulatory Visit | Attending: Internal Medicine | Admitting: Internal Medicine

## 2012-12-23 LAB — CBC WITH AUTO DIFFERENTIAL
Basophils %: 0.3 %
Basophils Absolute: 0 10*3/uL (ref 0.0–0.2)
Eosinophils %: 2.1 %
Eosinophils Absolute: 0.1 10*3/uL (ref 0.0–0.6)
Hematocrit: 33.4 % — ABNORMAL LOW (ref 36.0–48.0)
Hemoglobin: 11.3 g/dL — ABNORMAL LOW (ref 12.0–16.0)
Lymphocytes %: 30.4 %
Lymphocytes Absolute: 2.1 10*3/uL (ref 1.0–5.1)
MCH: 31 pg (ref 26.0–34.0)
MCHC: 33.9 g/dL (ref 31.0–36.0)
MCV: 91.6 fL (ref 80.0–100.0)
MPV: 7.3 fL (ref 5.0–10.5)
Monocytes %: 4.7 %
Monocytes Absolute: 0.3 10*3/uL (ref 0.0–1.3)
Neutrophils %: 62.5 %
Neutrophils Absolute: 4.4 10*3/uL (ref 1.7–7.7)
Platelets: 246 10*3/uL (ref 135–450)
RBC: 3.64 M/uL — ABNORMAL LOW (ref 4.00–5.20)
RDW: 15.9 % — ABNORMAL HIGH (ref 12.4–15.4)
WBC: 7.1 10*3/uL (ref 4.0–11.0)

## 2012-12-23 LAB — COMPREHENSIVE METABOLIC PANEL
ALT: 13 U/L (ref 10–40)
AST: 16 U/L (ref 15–37)
Albumin/Globulin Ratio: 1.4 (ref 1.1–2.2)
Albumin: 4.2 g/dL (ref 3.4–5.0)
Alkaline Phosphatase: 119 U/L (ref 40–129)
BUN: 7 mg/dL (ref 7–20)
CO2: 22 mmol/L (ref 21–32)
Calcium: 9.4 mg/dL (ref 8.3–10.6)
Chloride: 108 mmol/L (ref 99–110)
Creatinine: 0.5 mg/dL — ABNORMAL LOW (ref 0.6–1.2)
GFR African American: >60 (ref >60)
GFR Non-African American: >60 (ref >60)
Globulin: 2.9 g/dL
Glucose: 86 mg/dL (ref 70–99)
Potassium: 3.4 mmol/L — ABNORMAL LOW (ref 3.5–5.1)
Sodium: 141 mmol/L (ref 136–145)
Total Bilirubin: 0.4 mg/dL (ref 0.0–1.0)
Total Protein: 7.1 g/dL (ref 6.4–8.2)

## 2012-12-23 LAB — BLOOD GAS, ARTERIAL
Base Excess, Arterial: -2 mmol/L (ref <=3.0)
Carboxyhgb, Arterial: 0.5 % (ref 0.0–1.5)
HCO3, Arterial: 21.6 mmol/L (ref 21.0–29.0)
Hemoglobin, Art, Extended: 11.4 g/dL — ABNORMAL LOW (ref 12.0–16.0)
Methemoglobin, Arterial: 0.5 % (ref <1.5)
O2 Content, Arterial: 16 mL/dL
O2 Sat, Arterial: 98.4 % (ref >92)
TCO2, Arterial: 22.7 mmol/L
pCO2, Arterial: 34.8 mmHg — ABNORMAL LOW (ref 35.0–45.0)
pH, Arterial: 7.409 (ref 7.350–7.450)
pO2, Arterial: 112 mmHg — ABNORMAL HIGH (ref 75.0–108.0)

## 2012-12-23 LAB — SEDIMENTATION RATE: Sed Rate: 35 mm/Hr — ABNORMAL HIGH (ref 0–30)

## 2012-12-23 LAB — PROTIME-INR
INR: 1.05 (ref 0.85–1.15)
Protime: 11.7 s (ref 10.0–12.8)

## 2012-12-23 LAB — APTT: aPTT: 34.5 s (ref 23.1–35.5)

## 2012-12-23 LAB — D-DIMER, QUANTITATIVE: D-Dimer, Quant: 309 ng/mL DDU — ABNORMAL HIGH (ref 0–229)

## 2012-12-23 MED ORDER — PAROXETINE HCL 40 MG PO TABS
40 MG | ORAL_TABLET | Freq: Every day | ORAL | Status: DC
Start: 2012-12-23 — End: 2012-12-25

## 2012-12-23 MED ADMIN — HYDROmorphone HCl PF (DILAUDID) injection SOLN 1 mg: 1 mg | INTRAVENOUS | @ 23:00:00 | NDC 00409128331

## 2012-12-23 MED ADMIN — pantoprazole (PROTONIX) injection 40 mg: 40 mg | INTRAVENOUS | @ 23:00:00 | NDC 00008400101

## 2012-12-23 MED ADMIN — 0.9% NaCl with KCl 20 mEq infusion: INTRAVENOUS | @ 23:00:00 | NDC 00338069104

## 2012-12-23 MED FILL — POTASSIUM CHLORIDE IN NACL 20-0.9 MEQ/L-% IV SOLN: INTRAVENOUS | Qty: 1000

## 2012-12-23 MED FILL — ATORVASTATIN CALCIUM 40 MG PO TABS: 40 MG | ORAL | Qty: 1

## 2012-12-23 MED FILL — HYDROMORPHONE HCL PF 1 MG/ML IJ SOLN: 1 MG/ML | INTRAMUSCULAR | Qty: 1

## 2012-12-23 MED FILL — PROTONIX 40 MG IV SOLR: 40 MG | INTRAVENOUS | Qty: 40

## 2012-12-23 MED FILL — TOPIRAMATE 25 MG PO TABS: 25 MG | ORAL | Qty: 2

## 2012-12-23 NOTE — Plan of Care (Signed)
Problem: Falls - Risk of  Goal: Absence of falls  Outcome: Ongoing  Fall risk assessment completed every shift. All precautions in place. Pt has call light within reach at all times. Room clear of clutter. Pt aware to call for assistance when getting up.           Problem: Pain  Goal: Reduction in pain sensation  Outcome: Ongoing  Pain/discomfort being managed with PRN analgesics per MD orders. Pt able to express presence and absence of pain and rate pain appropriately using numerical scale.        Goal: Control of acute pain  Outcome: Ongoing  Pain/discomfort being managed with PRN analgesics per MD orders. Pt able to express presence and absence of pain and rate pain appropriately using numerical scale.

## 2012-12-23 NOTE — Progress Notes (Signed)
Subjective:      Patient ID: Linda [name] is a 67 y.o. female.    HPIwho has having a continuous headache for six weeks. She has not had any relief with prednisone taper, topamax, relpax, toradol, phenergan, im imitrex. She went to ER and got two doses of dilaudid which finally helped for a few hours.  Her worst pain is over her right temple.  PFSH reviewed and updated     Review of Systems  Husband is very sickly and she gets upset with his being hard of hearing  Her pain is made worse with light and noise  She is trying to get her husband interested, motivated in anything,she talks to him a lot and cannot get him to lift up at all.  Objective:   Physical Exam  Sitting in the dark wearing sunglasses most tender over right temple  Neck supple  Chest clear  Cardio RRR no mrg  abdo soft nontender  Neuro has been nonfocal  MRI has wmd  Assessment:      1.continuous right temple pain : not typical for temple a. But would like t.a. bx will call Dr Army Melia  2.status migrainous:  Think more likely will use dilaudid and phenergan til sees surgeon avoid steroids  3.  Depression increase paxil       Plan:      As above  Admit med surg bed

## 2012-12-23 NOTE — Consults (Addendum)
Agree with H&P from Outpt notes, no changes noted . All questions answered.Pt seen and examined, detailed explanation given of Rt Temp A Bx. She agrees to proceed. D/W Dr Eliseo Squires       Sodium 141 mmol/L     Potassium 3.4 (L) mmol/L     Chloride 108 mmol/L     CO2 22 mmol/L     Glucose 86 mg/dL     BUN 7 mg/dL     CREATININE 0.5 (L) mg/dL     GFR Non-African American >60     Comment:      >60 mL/min/1.35m2 EGFR, calc. for ages 68 and older using the  MDRD formula (not corrected for weight), is valid for stable  renal function.     GFR African American >60     Comment:      Chronic Kidney Disease: less than 60 ml/min/1.73 sq.m.  Kidney Failure: less than 15 ml/min/1.73 sq.m.  Results valid for patients 18 years and older.     Calcium 9.4 mg/dL     Total Protein 7.1 g/dL     Alb 4.2 g/dL     Albumin/Globulin Ratio 1.4     Total Bilirubin 0.4 mg/dL     Alkaline Phosphatase 119 U/L     ALT 13 U/L     AST 16 U/L     Globulin 2.9 g/dL    D-Dimer, Quantitative [[id number]]    Collected: 12/23/12 1812    Updated: 12/23/12 1855    Specimen Source: Blood    APTT [[id number]]    Collected: 12/23/12 1815    Updated: 12/23/12 1843    Specimen Source: Blood     aPTT 34.5 sec     Comment:      Therapeutic range: 54.0 - 90.0 sec    Protime-INR [[id number]]    Collected: 12/23/12 1815    Updated: 12/23/12 1843    Specimen Source: Blood     Protime 11.7 sec     INR 1.05     Comment:      Normal: 0.85 - 1.15  Therapeutic: 2.0 - 3.0  Pros. Valve: 2.5 - 3.5    CBC Auto Differential [[id number]] (Abnormal)    Collected: 12/23/12 1815    Updated: 12/23/12 1825    Specimen Source: Blood     WBC 7.1 K/uL     RBC 3.64 (L) M/uL     Hemoglobin 11.3 (L) g/dL     Hematocrit 62.9 (L) %     MCV 91.6 fL     MCH 31.0 pg     MCHC 33.9 g/dL     RDW 52.8 (H) %     Platelets 246 K/uL     MPV 7.3 fL     Neutrophils Relative 62.5 %     Lymphocytes Relative 30.4 %     Monocytes Relative 4.7 %     Eosinophils Relative Percent 2.1 %     Basophils Relative 0.3 %      Neutrophils Absolute 4.4 K/uL     Lymphocytes Absolute 2.1 K/uL     Monocytes Absolute 0.3 K/uL     Eosinophils Absolute 0.1 K/uL     Basophils Absolute 0.0 K/uL    Sedimentation Rate [[id number]]    Collected: 12/23/12 1815    Updated: 12/23/12 1823    Specimen Source: Blood

## 2012-12-23 NOTE — Anesthesia Pre-Procedure Evaluation (Addendum)
[facility] Department of Anesthesiology  Pre-Anesthesia Evaluation/Consultation       Name:  Linda [name]                                         Age:  67 y.o.  MRN:  [id number]           Procedure (Scheduled):  TEMPORAL ARTERY BIOPSY  Surgeon:  Dr. Army Melia     Allergies   Allergen Reactions   ??? Actonel (Risedronate Sodium)      Osteonecrosis jaw documented     Patient Active Problem List   Diagnosis   ??? Hyperlipidemia   ??? Senile osteoporosis   ??? Unspecified menopausal and postmenopausal disorder   ??? GERD (gastroesophageal reflux disease)   ??? Migraine headache   ??? Colon polyps   ??? Depression   ??? Intractable headache   ??? Hypoxemia   ??? Osteoporosis     Past Medical History   Diagnosis Date   ??? Postmenopausal    ??? Dyspepsia      nonulcerative   ??? Colon polyps      adenomatous    ??? Thoracic compression fracture      T10 from a fall   ??? Bisphosphonate-associated osteonecrosis of the jaw    ??? Migraine      recurrent   ??? History of depression    ??? Osteoarthritis of lumbar spine      Chunduri     Past Surgical History   Procedure Laterality Date   ??? Kyphosis surgery       t10   ??? Shoulder surgery       left humerus fx 3 places     History   Substance Use Topics   ??? Smoking status: Former Smoker     Quit date: 08/06/1997   ??? Smokeless tobacco: Never Used      Comment: passive exposure husband chain smokes   ??? Alcohol Use: No     Medications  No current facility-administered medications on file prior to encounter.     Current Outpatient Prescriptions on File Prior to Encounter   Medication Sig Dispense Refill   ??? PARoxetine (PAXIL) 40 MG tablet Take 1 tablet by mouth daily.  30 tablet  3   ??? topiramate (TOPAMAX) 25 MG tablet One po bid for a week then increase to two po bid until rechecked to prevent migraines must take daily  120 tablet  3   ??? atorvastatin (LIPITOR) 40 MG tablet Take 1 tablet by mouth daily.  30 tablet  1   ??? rizatriptan (MAXALT) 10 MG tablet Take 1 tablet by mouth once as needed  for Migraine for 1 dose. May repeat in 2 hours if needed  30 tablet  3   ??? Biotin 5000 MCG CAPS Take  by mouth.       ??? Omega-3 Fatty Acids (FISH OIL) 500 MG CAPS Take 500 mg by mouth 2 times daily.       ??? Calcium Carbonate-Vitamin D (CALTRATE 600+D) 600-400 MG-UNIT CHEW Take 1 tablet by mouth 2 times daily.  60 tablet  11     Current Facility-Administered Medications   Medication Dose Route Frequency Provider Last Rate Last Dose   ??? [MAR HOLD] 0.9 % sodium chloride infusion   Intravenous Continuous Blossom Hoops, MD 75 mL/hr at  12/24/12 0607     ??? [MAR HOLD] sodium chloride flush 0.9 % injection 10 mL  10 mL Intravenous Q12H [facility] Blossom Hoops, MD       ??? [facility] HOLD] sodium chloride flush 0.9 % injection 10 mL  10 mL Intravenous PRN Blossom Hoops, MD       ??? [facility] HOLD] HYDROmorphone HCl PF (DILAUDID) injection SOLN 1 mg  1 mg Intravenous Q3H PRN Gus Rankin, MD   1 mg at 12/24/12 0644   ??? [MAR HOLD] atorvastatin (LIPITOR) tablet 40 mg  40 mg Oral Nightly Ritta Slot, MD   40 mg at 12/23/12 2023   ??? [MAR HOLD] 0.9% NaCl with KCl 20 mEq infusion   Intravenous Continuous Ritta Slot, MD       ??? [facility] HOLD] pantoprazole (PROTONIX) injection 40 mg  40 mg Intravenous Daily Ritta Slot, MD   40 mg at 12/23/12 1909   ??? [MAR HOLD] topiramate (TOPAMAX) tablet 50 mg  50 mg Oral BID Ritta Slot, MD   50 mg at 12/23/12 2023   ??? [MAR HOLD] PARoxetine (PAXIL) tablet 40 mg  40 mg Oral Daily Ritta Slot, MD       ??? [facility] HOLD] promethazine (PHENERGAN) 12.5mg  in sodium chloride 0.9% 50 mL IVPB 12.5 mg  12.5 mg Intravenous Q4H PRN Ritta Slot, MD       ??? [facility] HOLD] pneumococcal vaccine (PNEUMOVAX 23) inj 0.5 mL  0.5 mL Intramuscular Once Lenon Oms, RN         Vital Signs (Current)   Filed Vitals:    12/24/12 0524   BP: 119/80   Pulse: 61   Temp: 98.2 ??F (36.8 ??C)   Resp: 18     Vital Signs Statistics (for past 48 hrs)     BP  Min: 102/66   Min taken time: 12/24/12 0056  Max: 151/90   Max taken time: 12/22/12 1455  Temp   Avg: 98.1 ??F (36.7 ??C)  Min: 97.9 ??F (36.6 ??C)   Min taken time: 12/22/12 1509  Max: 98.3 ??F (36.8 ??C)   Max taken time: 12/23/12 2018  Pulse  Avg: 71.9  Min: 61   Min taken time: 12/24/12 0524  Max: 88   Max taken time: 12/22/12 1325  Resp  Avg: 17.1  Min: 14   Min taken time: 12/23/12 1507  Max: 20   Max taken time: 12/23/12 1709  SpO2  Avg: 97.3 %  Min: 86 %   Min taken time: 12/23/12 1709  Max: 100 %   Max taken time: 12/22/12 1923    BP Readings from Last 3 Encounters:   12/24/12 119/80   12/23/12 122/82   12/22/12 126/81     BMI  Body mass index is 21.27 kg/(m^2).  Estimated body mass index is 21.27 kg/(m^2) as calculated from the following:    Height as of this encounter: 5\' 4"  (1.626 m).    Weight as of this encounter: 124 lb (56.246 kg).    CBC   Lab Results   Component Value Date    WBC 7.1 12/23/2012    RBC 3.64 12/23/2012    HGB 11.3 12/23/2012    HCT 33.4 12/23/2012    MCV 91.6 12/23/2012    RDW 15.9 12/23/2012    PLT 246 12/23/2012     CMP    Lab Results   Component Value Date    NA 141 12/23/2012    K 3.4 12/23/2012  CL 108 12/23/2012    CO2 22 12/23/2012    BUN 7 12/23/2012    CREATININE 0.5 12/23/2012    GFRAA >60 12/23/2012    GFRAA >60 05/12/2010    AGRATIO 1.4 12/23/2012    LABGLOM >60 12/23/2012    GLUCOSE 86 12/23/2012    PROT 7.1 12/23/2012    PROT 7.8 05/12/2010    CALCIUM 9.4 12/23/2012    BILITOT 0.4 12/23/2012    ALKPHOS 119 12/23/2012    AST 16 12/23/2012    ALT 13 12/23/2012     BMP    Lab Results   Component Value Date    NA 141 12/23/2012    K 3.4 12/23/2012    CL 108 12/23/2012    CO2 22 12/23/2012    BUN 7 12/23/2012    CREATININE 0.5 12/23/2012    CALCIUM 9.4 12/23/2012    GFRAA >60 12/23/2012    GFRAA >60 05/12/2010    LABGLOM >60 12/23/2012    GLUCOSE 86 12/23/2012     POCGlucose  Recent Labs      12/22/12   1600  12/23/12   1815   GLUCOSE  85  86      Coags    Lab Results   Component Value Date    PROTIME 11.7 12/23/2012     HCG (If Applicable)   No results found for this basename: PREGTESTUR,  PREGSERUM,  HCG,   HCGQUANT      ABGs   Lab Results   Component Value Date    PHART 7.409 12/23/2012      Type & Screen (If Applicable)  No results found for this basename: LABABO,  LABRH            Anesthesia Evaluation     Patient summary reviewed and Nursing notes reviewed    Airway   Mallampati: II  TM distance: >3 FB  Neck ROM: full  Dental      Pulmonary     breath sounds clear to auscultation  (-) pneumonia, COPD, asthma, shortness of breath, recent URI, sleep apnea  Cardiovascular   (-) pacemaker, hypertension, valvular problems/murmurs, past MI, CAD, CABG/stent, dysrhythmias, angina, CHF, orthopnea, PND, DOE    ECG reviewed  Rhythm: regular  Rate: normal  Beta Blocker:  Not on Beta Blocker    Neuro/Psych    (+) headaches, psychiatric history (hx depression)  (-) seizures, neuromuscular disease, TIA, CVA  GI/Hepatic/Renal    (+) GERD well controlled,   (-) hiatal hernia, PUD, hepatitis, liver disease, renal disease, bowel prep    Endo/Other    (-) no type I diabetes, no type II diabetes, hypothyroidism, hyperthyroidism, blood dyscrasia, arthritis  Abdominal  - normal exam       Other findings: Sunglasses on           Allergies: Actonel    NPO Status:  mn+ , no meds today                            Anesthesia Plan    ASA 2     MAC   (TIVA)  intravenous induction   Anesthetic plan and risks discussed with patient.    Plan discussed with CRNA.    DOS STAFF ADDENDUM:    Pt seen and examined, chart reviewed (including anesthesia, drug and allergy history).  No interval changes to history and physical examination.  Anesthetic plan, risks, benefits, alternatives, and personnel involved discussed  with patient.  Patient verbalized an understanding and agrees to proceed.      Tonie Griffith, MD  Dec 24, 2012  7:43 AM      Coralie Keens [facility]  12/24/2012

## 2012-12-24 MED ORDER — NORMAL SALINE FLUSH 0.9 % IV SOLN
0.9 % | INTRAVENOUS | Status: DC | PRN
Start: 2012-12-24 — End: 2012-12-24

## 2012-12-24 MED ORDER — SODIUM CHLORIDE 0.9 % IV SOLN
0.9 % | INTRAVENOUS | Status: DC
Start: 2012-12-24 — End: 2012-12-24
  Administered 2012-12-24: 10:00:00 via INTRAVENOUS

## 2012-12-24 MED ORDER — OXYCODONE-ACETAMINOPHEN 5-325 MG PO TABS
5-325 | ORAL | Status: DC | PRN
Start: 2012-12-24 — End: 2012-12-25

## 2012-12-24 MED ORDER — HYDROMORPHONE HCL PF 1 MG/ML IJ SOLN
1 | INTRAMUSCULAR | Status: DC | PRN
Start: 2012-12-24 — End: 2012-12-24
  Administered 2012-12-24 (×2): 1 mg via INTRAVENOUS

## 2012-12-24 MED ORDER — PNEUMOCOCCAL 23-POLYVALENT VACC INJ
25 | Freq: Once | INTRAMUSCULAR | Status: AC
Start: 2012-12-24 — End: 2012-12-25
  Administered 2012-12-25: 20:00:00 0.5 mL via INTRAMUSCULAR

## 2012-12-24 MED ORDER — ONDANSETRON HCL 4 MG/2ML IJ SOLN
4 | Freq: Once | INTRAMUSCULAR | Status: DC | PRN
Start: 2012-12-24 — End: 2012-12-24

## 2012-12-24 MED ORDER — FAMOTIDINE 40 MG/4ML IV SOLN
40 MG/4ML | Freq: Once | INTRAVENOUS | Status: AC
Start: 2012-12-24 — End: 2012-12-24
  Administered 2012-12-24: 10:00:00 20 mg via INTRAVENOUS

## 2012-12-24 MED ORDER — HYDROMORPHONE HCL PF 2 MG/ML IJ SOLN
2 | INTRAMUSCULAR | Status: DC | PRN
Start: 2012-12-24 — End: 2012-12-25
  Administered 2012-12-24 – 2012-12-25 (×4): 2 mg via INTRAVENOUS

## 2012-12-24 MED ORDER — NORMAL SALINE FLUSH 0.9 % IV SOLN
0.9 % | Freq: Two times a day (BID) | INTRAVENOUS | Status: DC
Start: 2012-12-24 — End: 2012-12-24

## 2012-12-24 MED ORDER — MORPHINE SULFATE (PF) 2 MG/ML IV SOLN
2 | INTRAVENOUS | Status: DC | PRN
Start: 2012-12-24 — End: 2012-12-24

## 2012-12-24 MED ORDER — FENTANYL CITRATE 0.05 MG/ML IJ SOLN
0.05 | INTRAMUSCULAR | Status: DC | PRN
Start: 2012-12-24 — End: 2012-12-24

## 2012-12-24 MED ADMIN — topiramate (TOPAMAX) tablet 50 mg: 50 mg | ORAL | NDC 51079072601

## 2012-12-24 MED ADMIN — hydrocortisone sodium succinate PF (SOLU-CORTEF) injection 100 mg: 100 mg | INTRAVENOUS | @ 16:00:00 | NDC 00009001103

## 2012-12-24 MED ADMIN — HYDROmorphone HCl PF (DILAUDID) injection SOLN 1 mg: 1 mg | INTRAVENOUS | @ 03:00:00 | NDC 00409128331

## 2012-12-24 MED ADMIN — 0.9% NaCl with KCl 20 mEq infusion: INTRAVENOUS | @ 22:00:00 | NDC 00338069104

## 2012-12-24 MED ADMIN — PARoxetine (PAXIL) tablet 40 mg: 40 mg | ORAL | @ 16:00:00 | NDC 68084004511

## 2012-12-24 MED ADMIN — oxyCODONE-acetaminophen (PERCOCET) 5-325 MG per tablet 2 tablet: 2 | ORAL | @ 16:00:00 | NDC 00406051223

## 2012-12-24 MED ADMIN — HYDROmorphone HCl PF (DILAUDID) injection SOLN 1 mg: 1 mg | INTRAVENOUS | @ 08:00:00 | NDC 00409128331

## 2012-12-24 MED ADMIN — atorvastatin (LIPITOR) tablet 40 mg: 40 mg | ORAL | NDC 68084009911

## 2012-12-24 MED ADMIN — topiramate (TOPAMAX) tablet 50 mg: 50 mg | ORAL | @ 16:00:00 | NDC 51079072601

## 2012-12-24 MED ADMIN — diphenhydrAMINE (BENADRYL) injection 12.5 mg: 12.5 mg | INTRAVENOUS | @ 10:00:00 | NDC 63323066401

## 2012-12-24 MED FILL — HYDROMORPHONE HCL PF 1 MG/ML IJ SOLN: 1 MG/ML | INTRAMUSCULAR | Qty: 1

## 2012-12-24 MED FILL — PAROXETINE HCL 20 MG PO TABS: 20 MG | ORAL | Qty: 2

## 2012-12-24 MED FILL — FAMOTIDINE 40 MG/4ML IV SOLN: 40 MG/4ML | INTRAVENOUS | Qty: 4

## 2012-12-24 MED FILL — OXYCODONE-ACETAMINOPHEN 5-325 MG PO TABS: 5-325 MG | ORAL | Qty: 2

## 2012-12-24 MED FILL — POTASSIUM CHLORIDE IN NACL 20-0.9 MEQ/L-% IV SOLN: INTRAVENOUS | Qty: 1000

## 2012-12-24 MED FILL — SODIUM CHLORIDE 0.9 % IV SOLN: 0.9 % | INTRAVENOUS | Qty: 1000

## 2012-12-24 MED FILL — PROTONIX 40 MG IV SOLR: 40 MG | INTRAVENOUS | Qty: 40

## 2012-12-24 MED FILL — LOVENOX 40 MG/0.4ML SC SOLN: 40 MG/0.4ML | SUBCUTANEOUS | Qty: 0.4

## 2012-12-24 MED FILL — ATORVASTATIN CALCIUM 40 MG PO TABS: 40 MG | ORAL | Qty: 1

## 2012-12-24 MED FILL — TOPIRAMATE 25 MG PO TABS: 25 MG | ORAL | Qty: 2

## 2012-12-24 MED FILL — TOPIRAMATE 100 MG PO TABS: 100 MG | ORAL | Qty: 1

## 2012-12-24 MED FILL — HYDROMORPHONE HCL PF 2 MG/ML IJ SOLN: 2 MG/ML | INTRAMUSCULAR | Qty: 1

## 2012-12-24 MED FILL — PNEUMOVAX 23 25 MCG/0.5ML IJ INJ: 25 MCG/0.5ML | INTRAMUSCULAR | Qty: 0.5

## 2012-12-24 MED FILL — DIPHENHYDRAMINE HCL 50 MG/ML IJ SOLN: 50 MG/ML | INTRAMUSCULAR | Qty: 1

## 2012-12-24 NOTE — H&P (Signed)
CC: headache          History and Physical  HPI:66 yo wf with several weeks of an intractable headache unresponsive to prednisone taper, tryptans, topamax etc.  She has had a mildly elevated esr and was in the OR for a temp art bx when i rounded on her this morning.   This evening she reports that her pain is down to a five and that the dilaudid injections have not been strong enough.  She is speaking rapidly and her conversation is tangential but she seems less depressed and has less photophobia.      Patient Active Problem List    Diagnosis Date Noted   ??? Hyperlipidemia      Priority: High   ??? Intractable headache 12/23/2012   ??? Hypoxemia 12/23/2012   ??? Osteoporosis 12/23/2012   ??? Depression 07/25/2010   ??? Senile osteoporosis    ??? Unspecified menopausal and postmenopausal disorder    ??? GERD (gastroesophageal reflux disease)    ??? Migraine headache    ??? Colon polyps      Past Medical History   Diagnosis Date   ??? Postmenopausal    ??? Dyspepsia      nonulcerative   ??? Colon polyps      adenomatous    ??? Thoracic compression fracture      T10 from a fall   ??? Bisphosphonate-associated osteonecrosis of the jaw    ??? Migraine      recurrent   ??? History of depression    ??? Osteoarthritis of lumbar spine      Chunduri      Past Surgical History   Procedure Laterality Date   ??? Kyphosis surgery       t10   ??? Shoulder surgery       left humerus fx 3 places   ??? Artery biopsy Right 12-24-12     Temporal Artery biopsy right       Prescriptions prior to admission:PARoxetine (PAXIL) 40 MG tablet, Take 1 tablet by mouth daily.  topiramate (TOPAMAX) 25 MG tablet, One po bid for a week then increase to two po bid until rechecked to prevent migraines must take daily  atorvastatin (LIPITOR) 40 MG tablet, Take 1 tablet by mouth daily.  rizatriptan (MAXALT) 10 MG tablet, Take 1 tablet by mouth once as needed for Migraine for 1 dose. May repeat in 2 hours if needed  Biotin 5000 MCG CAPS, Take  by mouth.  Omega-3 Fatty Acids (FISH OIL) 500 MG  CAPS, Take 500 mg by mouth 2 times daily.  Calcium Carbonate-Vitamin D (CALTRATE 600+D) 600-400 MG-UNIT CHEW, Take 1 tablet by mouth 2 times daily.  [DISCONTINUED] Red Yeast Rice Extract (RED YEAST RICE PO), Take 600 mcg/day by mouth daily.  Allergies   Allergen Reactions   ??? Actonel (Risedronate Sodium)      Osteonecrosis jaw documented      History   Substance Use Topics   ??? Smoking status: Former Smoker     Quit date: 08/06/1997   ??? Smokeless tobacco: Never Used      Comment: passive exposure husband chain smokes   ??? Alcohol Use: No      Family History   Problem Relation Age of Onset   ??? Breast Cancer Sister       Review of Systems  A comprehensive review of systems was negative except for: cannot sleep, depressed over husbands decline    Objective:     Patient Vitals for the  past 8 hrs:   BP Temp Temp src Pulse Resp SpO2   12/24/12 1456 129/90 mmHg 98.7 ??F (37.1 ??C) Oral 86 16 97 %     I/O last 3 completed shifts:  In: 1876 [P.O.:120; I.V.:1756]  Out: -            General Appearance: alert and oriented to person, place and time, well-developed and well-nourished, in no acute distress  Skin: warm and dry, no rash or erythema  Head: normocephalic and atraumatic  Eyes: pupils equal, round, and reactive to light, extraocular eye movements intact, conjunctivae normal  ENT: hearing grossly normal bilaterally  Neck: neck supple and non tender without mass, no thyromegaly or thyroid nodules, no cervical lymphadenopathy   Pulmonary/Chest: clear to auscultation bilaterally- no wheezes, rales or rhonchi, normal air movement, no respiratory distress  Cardiovascular: normal rate, regular rhythm, normal S1 and S2, no murmurs, rubs, clicks or gallops, distal pulses intact, no carotid bruits  Abdomen: soft, non-tender, non-distended, normal bowel sounds, no masses or organomegaly  Breast: not examined  Extremities: no cyanosis, clubbing or edema  Musculoskeletal: normal range of motion, no joint swelling, deformity or  tenderness  Neurologic: nonfocal not redone  Psych: very rapid fire speech    ECG: No prior ECG.   Data Review:      CBC:   Lab Results   Component Value Date    WBC 7.1 12/23/2012    RBC 3.64 12/23/2012    HGB 11.3 12/23/2012    HCT 33.4 12/23/2012    MCV 91.6 12/23/2012    MCH 31.0 12/23/2012    MCHC 33.9 12/23/2012    RDW 15.9 12/23/2012    PLT 246 12/23/2012    MPV 7.3 12/23/2012     CMP:    Lab Results   Component Value Date    NA 141 12/23/2012    K 3.4 12/23/2012    CL 108 12/23/2012    CO2 22 12/23/2012    BUN 7 12/23/2012    CREATININE 0.5 12/23/2012    GFRAA >60 12/23/2012    GFRAA >60 05/12/2010    AGRATIO 1.4 12/23/2012    LABGLOM >60 12/23/2012    GLUCOSE 86 12/23/2012    PROT 7.1 12/23/2012    PROT 7.8 05/12/2010    LABALBU 4.2 12/23/2012    CALCIUM 9.4 12/23/2012    BILITOT 0.4 12/23/2012    ALKPHOS 119 12/23/2012    AST 16 12/23/2012    ALT 13 12/23/2012     ABG:    Lab Results   Component Value Date    PHART 7.409 12/23/2012    PCO2ART 34.8 12/23/2012    PO2ART 112.0 12/23/2012    HCO3ART 21.6 12/23/2012    BEART -2.0 12/23/2012    TCO2ART 22.7 12/23/2012    O2SATART 98.4 12/23/2012     D dimer 305H  Radiology Review:  cxr clear        Assessment:     Patient Active Hospital Problem List:   Intractable headache (12/23/2012)    Assessment: suspect status migrainous partly brought on by depression worsening    Plan: will continue with steroids awaiting results of TA bx , will continue iv another night since still has significant pain increase topamax faster   Hypoxemia (12/23/2012)    Assessment: false    Plan: no prob   Osteoporosis (12/23/2012)    Assessment: no tx now    Plan: As above   Depression : increase Paxil 50 mg may need  a second rx              Ritta Slot, MD

## 2012-12-24 NOTE — Progress Notes (Signed)
[facility]  Patient denies C/O pain. Right temporal surgical site unchanged.

## 2012-12-24 NOTE — Brief Op Note (Signed)
Brief Postoperative Note    Linda [name]  Date of Birth:  09-10-1945  [id number]    Pre-operative Diagnosis: head ache poss temporal arteritis     Post-operative Diagnosis: Same    Procedure: Rt temporal artery bx 6.5 cm    Anesthesia: MAC and Local    Surgeons/Assistants: Norina Cowper/Barker    Estimated Blood Loss: less than 50     Complications: None    Specimens: Was Obtained:  Rt. Temp artery    Findings:       Electronically signed by Linde Gillis, MD on 12/24/2012 at 9:16 AM

## 2012-12-24 NOTE — Plan of Care (Signed)
1.Verify patient  2.Orientation surroundings  3.Confirms consent  4.Assess anxiety  5.Maintain core temperature 96.8 or above  6.Patient safety call light in reach side rail(s) up.  7. IV infusing without problems.  8.Assess increasing levels of pain.  9.Monitor for nausea.  10.Follow standard of cares.

## 2012-12-24 NOTE — Progress Notes (Signed)
Patient awake and alert. VSS. IV patent.Resp easy, unlabored on room air O2. Denies C/O pain or discomfort. Moving all extremities to command.  Right temporal incision with surgical glue intact and open to air.

## 2012-12-24 NOTE — Progress Notes (Signed)
Awake and alert. VSS. IV patent. Moving all to command. Denies C/O pain or discomfort. Monitor stable in SR. Resp easy unlabored on room air O2.

## 2012-12-24 NOTE — Anesthesia Post-Procedure Evaluation (Signed)
[facility] Department of Anesthesiology  Post-Anesthesia Note       Name:  TUMEKA CHIMENTI                                         Age:  67 y.o.  MRN:  [id number]     Last Vitals & Oxygen Saturation: BP 110/60   Pulse 83   Temp(Src) 97.4 ??F (36.3 ??C) (Temporal)   Resp 19   Ht 5\' 4"  (1.626 m)   Wt 124 lb (56.246 kg)   BMI 21.27 kg/m2   SpO2 98%  Patient Vitals for the past 4 hrs:   BP Temp Temp src Pulse Resp SpO2   12/24/12 0950 110/60 mmHg - - 83 19 -   12/24/12 0945 119/65 mmHg - - 83 17 -   12/24/12 0940 113/65 mmHg - - 78 14 -   12/24/12 0935 125/59 mmHg - - 76 11 -   12/24/12 0930 108/73 mmHg - - 78 15 -   12/24/12 0925 114/62 mmHg - - 93 20 -   12/24/12 0921 114/62 mmHg 97.4 ??F (36.3 ??C) Temporal 90 20 98 %   12/24/12 0745 146/84 mmHg 98.6 ??F (37 ??C) Temporal 71 18 97 %       Level of consciousness: awake, alert  and oriented    Respiratory: stable     Cardiovascular: stable     Hydration: stable     PONV: stable     Post-op pain: Adequate analgesia    Post-op assessment: no apparent anesthetic complications and tolerated procedure well    Complications:  none    Tonie Griffith, MD  Dec 24, 2012   9:59 AM

## 2012-12-24 NOTE — Progress Notes (Signed)
To preop. Consent signed on floor. Marland Kitchen. Pt awake, alert and oriented. Undergarments removed. Bracelets removed. Pt states no other jewerly on. IV infusing. Pt wearing sun glasses. Doesn't like bright lights or loud noises. VSS on RA

## 2012-12-24 NOTE — Plan of Care (Signed)
1.  Patient is identified using name and date of birth.  2.  The patient is free from signs and symptoms of injury.  3.  The patient receives appropriate medication(s), safely administered during the perioperative period.  4.  The patient had wound/tissuue perfusion consistent with or improved from baseline levels established preoperatively.  5.  The patient is at or returning to normothermia at the conclusion of the immediate postoperative period.  6.  The patient's fluid, electrolyte, and acid base balances are consistent with or improved from baseline levels established preoperatively.  7.  The patient's pulmonary function is consistent with or improved from baseline levels established preoperatively.  8.  The patient's cardiovascular status is consistent with or improved from baseline levels established preoperatively.  9.  The patient/caregiver participates in decisions affecting his or her perioperative care.  10.  The patient's care is consistent with the individualized perioperative plan of care.  11.  The patient's right to privacy is maintained.  12.  The patient is the recipient of competent and ethical care within legal standards of practice.  13.  The patient's value system, lifestyle, ethnicity, and culture are considered, respected, and incorporated in the perioperative plan of care.  14.  The patient demonstrates and/or reports adequate pain control throughout the perioperative period.  15.  The patient's neurological status is consistent with or improved from baseline levels established preoperatively.  16.  The patient/caregiver demonstrates knowledge of the expected responses to the operative or invasive procedure.  17.  Patient/caregiver has reduced anxiety.  Interventions - familiarize with environment and equipment.

## 2012-12-24 NOTE — Progress Notes (Signed)
Patient admitted to PACU from OR. Patient awake and alert. VSS. IV patent. Right temporal surgical incision well approximated with surgical glue open to air. Resp easy, unlabored. Monitor in SR. Arouses to name and moving all extremities to command. Patient denies C/O pain. Speech clear and answers appropriately.

## 2012-12-24 NOTE — Progress Notes (Signed)
SW met with pt at bedside.  Pt reports she lives with her spouse and has no d/c needs.  Pt reports having a migraine.  Lights off and sunglasses on in room.  Jamelle RushingAngela Cort Dragoo LSW

## 2012-12-24 NOTE — Progress Notes (Signed)
Dr. Army Meliaranley at bedside to talk with patient. Patient awake and alert. VSS. IV patent. Monitor stable. Right temporal incision dry intact with surgical glue and open to air.

## 2012-12-24 NOTE — Telephone Encounter (Signed)
i will see her later today

## 2012-12-24 NOTE — Op Note (Signed)
PATIENT NAME:                 PA #:            MR #ATTALLAH, ONTKO              [id number]       [id number]            SURGEON:                              SURG DATE:  DIS DATE:          Linde Gillis, MD                  12/24/2012                      DATE OF BIRTH:   AGE:           PATIENT TYPE:     RM #:              Mar 31, 1946        66            IPK                                     A patient of Dr. Eliseo Squires, with debilitating headaches that have been worked up  with MRI, sedimentation rate, etc., and have come up with the diagnosis her  sedimentation rate is persistently elevated and persistently on the right  side.  So, she is here for a temporal artery biopsy of the right side to rule  out temporal arteritis.     PREOPERATIVE DIAGNOSIS:  Right-sided headache.     POSTOPERATIVE DIAGNOSIS:  Right-sided headache.     OPERATIVE PROCEDURE:  Right temporal artery biopsy.     ASSISTANT:  Dewaine Conger.     ANESTHESIA:  Local MAC.     DESCRIPTION OF PROCEDURE:  The patient was placed on the table in the supine  position, prepped and draped in the usual sterile fashion.  Some of the hair  was shaved.  Before prepping we used the ultrasound to mark the temporal  artery; it had more of an anterior course than usual, instead of straight up  it was going along the edge of the hairline on the forehead.  So, we mapped  it; then we prepped and draped using a sterile fashion.  Antibiotics were not  indicated.  A boots were in place and working.  Beside her sedimentation rate  being up, her C-reactive protein has been normal, but she has had other blood  tests that were abnormal.  MRI was normal.  In any case, local anesthetic was  infiltrated with 1% lidocaine over the incision and the incision made and  carried down through the skin and fatty tissue.  Below the fascia the artery  was identified, dissected it out proximally, clipped, divided, and followed  up distally, taking several branches with it until the  entire specimen was  removed.  The Bovie unit was used to keep the field dry along with the clips.   The wound was closed with 2 layers of 4-0 Vicryl subcutaneously and  subcuticular, followed by Dermabond.  The patient  tolerated the procedure  well, sent to the recovery room in good condition.                                            Linde Gillis, MD     [phone]  DD: 12/24/2012 09:52   DT: 12/24/2012 10:35   Job #: [id number]  CC: Linde Gillis, MD  CC: Ritta Slot, MD

## 2012-12-24 NOTE — Progress Notes (Signed)
Patient stable to transfer to room 3113 per Dr. Joneen CarawayBeckmeyer.  Patient awake and alert.

## 2012-12-24 NOTE — Telephone Encounter (Signed)
Patient is in room 3113 and she had her procedure this morning and is requesting to see Dr. Eliseo SquiresSax..Marland Kitchen

## 2012-12-25 LAB — BASIC METABOLIC PANEL
BUN: 6 mg/dL — ABNORMAL LOW (ref 7–20)
CO2: 25 mmol/L (ref 21–32)
Calcium: 9 mg/dL (ref 8.3–10.6)
Chloride: 107 mmol/L (ref 99–110)
Creatinine: 0.6 mg/dL (ref 0.6–1.2)
GFR African American: >60 (ref >60)
GFR Non-African American: >60 (ref >60)
Glucose: 88 mg/dL (ref 70–99)
Potassium: 3.6 mmol/L (ref 3.5–5.1)
Sodium: 139 mmol/L (ref 136–145)

## 2012-12-25 MED ORDER — PREDNISONE 10 MG PO TABS
10 MG | ORAL_TABLET | ORAL | Status: DC
Start: 2012-12-25 — End: 2013-07-10

## 2012-12-25 MED ORDER — HYDROMORPHONE HCL 4 MG PO TABS
4 MG | ORAL_TABLET | ORAL | Status: DC
Start: 2012-12-25 — End: 2013-07-10

## 2012-12-25 MED ORDER — TOPIRAMATE 100 MG PO TABS
100 MG | ORAL_TABLET | Freq: Two times a day (BID) | ORAL | Status: DC
Start: 2012-12-25 — End: 2013-03-03

## 2012-12-25 MED ORDER — PNEUMOCOCCAL VAC POLYVALENT 25 MCG/0.5ML IJ INJ
25 MCG/0.5ML | Freq: Once | INTRAMUSCULAR | Status: AC
Start: 2012-12-25 — End: 2012-12-25

## 2012-12-25 MED ORDER — PAROXETINE HCL 20 MG PO TABS
20 MG | ORAL_TABLET | ORAL | Status: DC
Start: 2012-12-25 — End: 2012-12-26

## 2012-12-25 MED ORDER — PROMETHAZINE HCL 25 MG PO TABS
25 MG | ORAL_TABLET | Freq: Four times a day (QID) | ORAL | Status: AC | PRN
Start: 2012-12-25 — End: 2013-01-24

## 2012-12-25 MED ADMIN — pantoprazole (PROTONIX) injection 40 mg: 40 mg | INTRAVENOUS | @ 14:00:00 | NDC 00008400101

## 2012-12-25 MED ADMIN — atorvastatin (LIPITOR) tablet 40 mg: 40 mg | ORAL | @ 03:00:00 | NDC 68084009911

## 2012-12-25 MED ADMIN — topiramate (TOPAMAX) tablet 100 mg: 100 mg | ORAL | @ 14:00:00 | NDC 51079072801

## 2012-12-25 MED ADMIN — 0.9% NaCl with KCl 20 mEq infusion: INTRAVENOUS | @ 07:00:00 | NDC 00338069104

## 2012-12-25 MED ADMIN — enoxaparin (LOVENOX) injection 40 mg: 40 mg | SUBCUTANEOUS | @ 03:00:00 | NDC 00075801401

## 2012-12-25 MED ADMIN — PARoxetine (PAXIL) tablet 50 mg: 50 mg | ORAL | @ 14:00:00 | NDC 68084004511

## 2012-12-25 MED ADMIN — methylPREDNISolone sodium (SOLU-MEDROL) injection 40 mg: 40 mg | INTRAVENOUS | @ 14:00:00 | NDC 00009003930

## 2012-12-25 MED ADMIN — topiramate (TOPAMAX) tablet 100 mg: 100 mg | ORAL | @ 03:00:00 | NDC 51079072801

## 2012-12-25 MED ADMIN — oxyCODONE-acetaminophen (PERCOCET) 5-325 MG per tablet 2 tablet: 2 | ORAL | @ 19:00:00 | NDC 00406051223

## 2012-12-25 MED FILL — HYDROMORPHONE HCL PF 2 MG/ML IJ SOLN: 2 MG/ML | INTRAMUSCULAR | Qty: 1

## 2012-12-25 MED FILL — POTASSIUM CHLORIDE IN NACL 20-0.9 MEQ/L-% IV SOLN: INTRAVENOUS | Qty: 1000

## 2012-12-25 MED FILL — PROTONIX 40 MG IV SOLR: 40 MG | INTRAVENOUS | Qty: 40

## 2012-12-25 MED FILL — PAROXETINE HCL 10 MG PO TABS: 10 MG | ORAL | Qty: 1

## 2012-12-25 MED FILL — DEXAMETHASONE SODIUM PHOSPHATE 20 MG/5ML IJ SOLN: 20 MG/5ML | INTRAMUSCULAR | Qty: 5

## 2012-12-25 MED FILL — OXYCODONE-ACETAMINOPHEN 5-325 MG PO TABS: 5-325 MG | ORAL | Qty: 2

## 2012-12-25 MED FILL — FRESENIUS PROPOVEN 10 MG/ML IV EMUL: 10 MG/ML | INTRAVENOUS | Qty: 40

## 2012-12-25 MED FILL — MIDAZOLAM HCL 2 MG/2ML IJ SOLN: 2 MG/ML | INTRAMUSCULAR | Qty: 2

## 2012-12-25 MED FILL — TOPIRAMATE 100 MG PO TABS: 100 MG | ORAL | Qty: 1

## 2012-12-25 MED FILL — SOLU-MEDROL 40 MG IJ SOLR: 40 MG | INTRAMUSCULAR | Qty: 40

## 2012-12-25 MED FILL — FENTANYL CITRATE 0.05 MG/ML IJ SOLN: 0.05 MG/ML | INTRAMUSCULAR | Qty: 2

## 2012-12-25 MED FILL — EPHEDRINE SULFATE 50 MG/ML IJ SOLN: 50 MG/ML | INTRAMUSCULAR | Qty: 1

## 2012-12-25 NOTE — Discharge Summary (Signed)
Physician Discharge Summary     Patient ID:   Linda [name]  [id number]  68 y.o.  04-13-1946    Admit date: 12/23/2012    Discharge date and time: 5/22    Admitting Physician: Ritta Slot, MD     Discharge Physician: Ritta Slot, MD    Discharge Diagnoses:   Status migrainous  Sp temporal art biopsy  Right arm iv infiltration  Depression        Discharged Condition: fair      Hospital Course: 67 yo wf who presented to the office in persistent severe head pain unrelieved by prednisone taper, tryptans, nsaids, etc.  She was admitted for iv steroids and a temporal art biopsy.  She tolerated the bx well.  That night her iv inflitrated and her right arm swelled throughout.  It was painful but not red or hot.  She was watched with elevation of her arm for several other hours prior to discharge, an incident report was done.    Consults: vascular surgery      Discharge Exam:  BP 99/62   Pulse 65   Temp(Src) 98.2 ??F (36.8 ??C) (Oral)   Resp 16   Ht 5\' 4"  (1.626 m)   Wt 124 lb (56.246 kg)   BMI 21.27 kg/m2   SpO2 94%  General appearance: alert, appears stated age and cooperative  Head: Normocephalic, without obvious abnormality, atraumatic  Lungs: clear to auscultation bilaterally  Heart: regular rate and rhythm, S1, S2 normal, no murmur, click, rub or gallop  Abdomen: soft, non-tender; bowel sounds normal; no masses,  no organomegaly  Extremities: extremities normal, atraumatic, no cyanosis or edema and right arm diffusely swollen from tip of fingers to the top of humerus    Disposition: home    Hospital Meds:  Current Facility-Administered Medications   Medication Dose Route Frequency Provider Last Rate Last Dose   ??? oxyCODONE-acetaminophen (PERCOCET) 5-325 MG per tablet 1 tablet  1 tablet Oral Q4H PRN Tonie Griffith, MD        Or   ??? oxyCODONE-acetaminophen (PERCOCET) 5-325 MG per tablet 2 tablet  2 tablet Oral Q4H PRN Tonie Griffith, MD   2 tablet at 12/24/12 1204   ??? PARoxetine (PAXIL) tablet 50 mg   50 mg Oral Daily Ritta Slot, MD   50 mg at 12/25/12 0944   ??? topiramate (TOPAMAX) tablet 100 mg  100 mg Oral BID Ritta Slot, MD   100 mg at 12/25/12 0944   ??? HYDROmorphone HCl PF (DILAUDID) injection 2 mg  2 mg Intravenous Q3H PRN Ritta Slot, MD   2 mg at 12/25/12 0934   ??? diphenhydrAMINE (BENADRYL) tablet 25 mg  25 mg Oral Q6H PRN Ritta Slot, MD       ??? enoxaparin (LOVENOX) injection 40 mg  40 mg Subcutaneous Nightly Ritta Slot, MD   40 mg at 12/24/12 2233   ??? magnesium hydroxide (MILK OF MAGNESIA) 400 MG/5ML suspension 30 mL  30 mL Oral Daily PRN Ritta Slot, MD       ??? atorvastatin (LIPITOR) tablet 40 mg  40 mg Oral Nightly Ritta Slot, MD   40 mg at 12/24/12 2233   ??? 0.9% NaCl with KCl 20 mEq infusion   Intravenous Continuous Ritta Slot, MD 100 mL/hr at 12/25/12 0326     ??? pantoprazole (PROTONIX) injection 40 mg  40 mg Intravenous Daily Ritta Slot, MD   40 mg at 12/25/12 0941   ??? promethazine (PHENERGAN) 12.5mg  in sodium  chloride 0.9% 50 mL IVPB 12.5 mg  12.5 mg Intravenous Q4H PRN Ritta Slot, MD       ??? pneumococcal vaccine (PNEUMOVAX 23) inj 0.5 mL  0.5 mL Intramuscular Once Lenon Oms, RN           Take Home Meds:  Current Discharge Medication List      START taking these medications    Details   pneumococcal vaccine (PNEUMOVAX 23) 25 MCG/0.5ML inj Inject 0.5 mLs into the muscle once for 1 dose.  Qty: 0.5 mL, Refills: 0      promethazine (PHENERGAN) 25 MG tablet Take 1 tablet by mouth every 6 hours as needed for Nausea for 30 days.  Qty: 120 tablet, Refills: 1      HYDROmorphone (DILAUDID) 4 MG tablet One po q4hr prn pain  Qty: 40 tablet, Refills: 0      predniSONE (DELTASONE) 10 MG tablet 6 tabs po qam  Qty: 180 tablet, Refills: 0         CONTINUE these medications which have CHANGED    Details   topiramate (TOPAMAX) 100 MG tablet Take 1 tablet by mouth 2 times daily.  Qty: 60 tablet, Refills: 3      PARoxetine (PAXIL) 20 MG tablet Two and half tabs daily  Qty: 150 tablet, Refills: 3         CONTINUE  these medications which have NOT CHANGED    Details   atorvastatin (LIPITOR) 40 MG tablet Take 1 tablet by mouth daily.  Qty: 30 tablet, Refills: 1      rizatriptan (MAXALT) 10 MG tablet Take 1 tablet by mouth once as needed for Migraine for 1 dose. May repeat in 2 hours if needed  Qty: 30 tablet, Refills: 3      Biotin 5000 MCG CAPS Take  by mouth.      Omega-3 Fatty Acids (FISH OIL) 500 MG CAPS Take 500 mg by mouth 2 times daily.      Calcium Carbonate-Vitamin D (CALTRATE 600+D) 600-400 MG-UNIT CHEW Take 1 tablet by mouth 2 times daily.  Qty: 60 tablet, Refills: 11         STOP taking these medications       Red Yeast Rice Extract (RED YEAST RICE PO) Comments:   Reason for Stopping:                 Activity: activity as tolerated  Diet: regular diet  Wound Care: as directed    Follow-up with me in one week, Dr Tim Lair in one month for Botox   Time Spent: over 35 minutes spent performing hospital discharge  Signed:  Ritta Slot, MD  12/25/2012  10:45 AM

## 2012-12-25 NOTE — Discharge Instructions (Signed)
Take it easy. No C/O Pain.

## 2012-12-25 NOTE — Other (Signed)
Patient alert & oriented times 4. Lying in bed. Resp easy. C/O of moderate amt of pain in Right temparol lobe. Lying in bed watching TV.Electronically signed by Farley Ly, RN on 12/25/2012 at 7:57 PM

## 2012-12-25 NOTE — Other (Signed)
D/C'D from hospital. Ambulated out of hospital with husband.Electronically signed by Farley Ly, RN on 12/25/2012 at 8:08 PM

## 2012-12-25 NOTE — Discharge Instructions (Signed)
Up as tolerated.

## 2012-12-25 NOTE — Discharge Instructions (Signed)
Regular diet.

## 2012-12-26 MED ORDER — PAROXETINE HCL 20 MG PO TABS
20 MG | ORAL_TABLET | ORAL | Status: DC
Start: 2012-12-26 — End: 2013-03-03

## 2012-12-26 NOTE — Telephone Encounter (Signed)
Needs Maxalt 1 tab by mouth once prn # 30.Marland Kitchen.Marland Kitchen.[facility] [phone]

## 2012-12-26 NOTE — Telephone Encounter (Signed)
Called the pharm she has refills left i have asked them to fill this for her   Tried to call her and let her know no answer

## 2013-01-02 NOTE — Patient Instructions (Addendum)
DECREASE PREDNISONE BY ONE A DAY TIL DOWN TO ZERO  STILL SEE CONTRACTOR

## 2013-01-02 NOTE — Progress Notes (Signed)
Subjective:      Patient ID: Linda [name] is a 67 y.o. female.    HPIiS doing much better since discharged from the hospital.  Headache is down to a one.  She is taking all her meds correctly.  She is having a better mood and more aware of headaches coming.  She is not having nausea either.  Husband is doing better.      Review of Systems    Objective:   Physical Exam  Neg bx  Assessment:    1. Chronic daily migraines:    2. Depression: doing better        Plan:         still to follow up with dr Surveyor, minerals

## 2013-01-26 MED ORDER — PAROXETINE HCL 40 MG PO TABS
40 MG | ORAL_TABLET | ORAL | Status: DC
Start: 2013-01-26 — End: 2013-02-27

## 2013-01-30 NOTE — Telephone Encounter (Signed)
Called and left contact information on machine.

## 2013-01-30 NOTE — Telephone Encounter (Signed)
Any one in particular or just [facility]?

## 2013-01-30 NOTE — Telephone Encounter (Signed)
[facility]er [facility]

## 2013-01-30 NOTE — Telephone Encounter (Signed)
Patient would like a name and number from the doctor for a dermatologist.  Patient can be reached today or you can leave her a message at [phone].

## 2013-02-04 NOTE — Telephone Encounter (Signed)
Done and called her to let her know

## 2013-02-04 NOTE — Telephone Encounter (Signed)
Linda [name] patient is asking if you can print out all her MRI reports, surgeries, and medications....she received the paper work for Dr. Laurence Ferrariontractor's appointment on July 16th and she is unable to fill it out without all this information...........can you call the patient when it is ready for pickup........[phone]..........thank you

## 2013-02-12 NOTE — Telephone Encounter (Signed)
Called patient scheduled at [facility] 02/27/13 11AM

## 2013-02-12 NOTE — Telephone Encounter (Signed)
Patient is calling as you have cancelled her July appointment and she wants a new appointment but I can't give her anything early and she doesn't want to wait another 3 months.  Patient can be reached today on her cell at [phone].

## 2013-02-27 ENCOUNTER — Encounter

## 2013-02-27 MED ORDER — DICLOFENAC POTASSIUM(MIGRAINE) 50 MG PO PACK
50 | ORAL | Status: DC | PRN
Start: 2013-02-27 — End: 2013-03-03

## 2013-02-27 MED ORDER — ELETRIPTAN HYDROBROMIDE 40 MG PO TABS
40 MG | ORAL_TABLET | Freq: Once | ORAL | Status: DC | PRN
Start: 2013-02-27 — End: 2013-03-03

## 2013-02-27 NOTE — Progress Notes (Signed)
Encounter Date: 02/27/2013    HPI: Linda [name] is a 67 y.o. right handed female who had concerns including Migraine., and is referred by Dr. Ritta Slot, MD    Linda [name] 's onset of headaches was at age 66 years and reports having 2-5 headache free days per month and this pattern has been going on for the past 6 month(s).    At onset, headache frequency was about 1 to 3 per month(s). remebers taking an ASA in her 55's and 30's. In the past few years they have been worst and more so in the past one month.   She had a temporal artery biopsy on 12/25/2012 which did not show any histologic evidence of temporal arteritis. She was also on a trial of prednisone which did not help improve the headache. She discontinued on her own. She is currently on Maxalt and reports no help. She is also on Topamax but takes it when necessary. She has been on Advil and Aleve without significant benefit. She has never been on beta blocker and her heart rate is in 60s.  Most recently, she was given hydromorphone and she was in the hospital. It gives her some relief.    Imaging Studies: November 06 2012: Mild chronic white matter microvascular ischemic changes.  ESR: 35.  Temporal artery, right, biopsy: 12/25/12  - Mild atherosclerosis with focal medial calcification.  - No histologic evidence of temporal arteritis.     Linda [name] describes 1 types of headaches which are described below.  Description of Headache Type I:   Frequency: 20 to  25  days per month.  Location: right side and temples  Spread: stays at right temple.  Severity: Range from 2 to 10 /10 intensity  Increased with movement like bending down and climbing stairs: no.  Character: pressure and constant  Percent upon awakening: 95%.  Onset of Headache pain: sudden .   Total Duration: 1 hour(s) to 3 days with treatment .  Triggers: waking from sleep  Prodrome: no.  Allodynia: yes  Aura: no.  Headache worsened with: bending, coughing  Headache Better with: medication    Symptoms Occurring with  the Headache:  Nausea: Frequently  Vomiting:Occasionally  Photophobia: Always  Phonophobia: Always  Osmophobia: Rarely. Numbness: None.   Weakness: None. Coordination Problem: yes. Dizziness:Occasionally. Visual Changes: no.   Ptosis: None Lacrimation: Occasionally.   Redness: None.  Nasal Discharge or congestion: Rarely.  Neck Symptoms:Occasionally  Headache worse with changes in posture: no    Current Headache Treatment:   Acute: Taking Topamax as an acute treatment. Dilaudid which was prescribed after her recent hospitalization for TA biopsy.  Preventive: topamax.  Biofeedback: no.  Physical Therapy/Massage Therapy: no.  In [place] or Psychotherapy: no.    Previous Headache Treatments (in addition to above):   Patient's prior treatments for headache and previously tried medications is scanned to chart.    Headache History over past 28 days:  In the past month Linda [name] reports:  Severe headaches: 20 days     Moderate headaches: 0 days  Mild headaches: 0 days   No headaches: 10 days    HIT-6 Score (Headache Impact Test):  68    HISTORY REVIEW:    Past Medical History   Diagnosis Date   ??? Postmenopausal    ??? Dyspepsia      nonulcerative   ??? Colon polyps      adenomatous    ??? Thoracic compression fracture (HCC)      T10 from a fall   ???  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 Migraine      recurrent   ??? History of depression    ??? Osteoarthritis of lumbar spine      [place]   ??? High cholesterol        Current Outpatient Prescriptions on File Prior to Visit   Medication Sig Dispense Refill   ??? PARoxetine (PAXIL) 20 MG tablet Two and half tabs daily  150 tablet  3   ??? topiramate (TOPAMAX) 100 MG tablet Take 1 tablet by mouth 2 times daily.  60 tablet  3   ??? HYDROmorphone (DILAUDID) 4 MG tablet One po q4hr prn pain  40 tablet  0   ??? predniSONE (DELTASONE) 10 MG tablet 6 tabs po qam  180 tablet  0   ??? atorvastatin (LIPITOR) 40 MG tablet Take 1 tablet by mouth daily.  30 tablet  1   ??? Biotin 5000  MCG CAPS Take  by mouth.       ??? Omega-3 Fatty Acids (FISH OIL) 500 MG CAPS Take 500 mg by mouth 2 times daily.       ??? Calcium Carbonate-Vitamin D (CALTRATE 600+D) 600-400 MG-UNIT CHEW Take 1 tablet by mouth 2 times daily.  60 tablet  11   ??? rizatriptan (MAXALT) 10 MG tablet Take 1 tablet by mouth once as needed for Migraine for 1 dose. May repeat in 2 hours if needed  30 tablet  3     No current facility-administered medications on file prior to visit.       Allergies   Allergen Reactions   ??? Actonel [Risedronate Sodium]      Osteonecrosis jaw documented       Family History   Problem Relation Age of Onset   ??? Breast Cancer Sister    ??? Cancer Mother    ??? Cancer Father    ??? Migraines Other    ??? Diabetes Other        History     Social History   ??? Marital Status: Married     Spouse Name: N/A     Number of Children: N/A   ??? Years of Education: N/A     Occupational History   ??? Not on file.     Social History Main Topics   ??? Smoking status: Former Smoker     Quit date: 08/06/1997   ??? Smokeless tobacco: Never Used      Comment: passive exposure husband chain smokes   ??? Alcohol Use: 0.0 oz/week      Comment: rarely   ??? Drug Use: No   ??? Sexual Activity: Not Currently     Other Topics Concern   ??? Not on file     Social History Narrative       A detailed review of patient's social, marital, work history, their activities, caffeine use, eating habits, substance use and mood was reviewed in detail and is scanned to the chart.     Patients's previous records were reviewed.    REVIEW OF SYSTEMS  Review of systems revealed the following positives as marked in addition to any already discussed in the HPI. Unmarked are negative.    Constitutional:  [] Chills   [] Fatigue   [] Fevers   [] Malaise   [] Weight loss   [] Other:      Eyes:   [] Double vision   [] Blurry vision   [] Other:     Ears, nose, mouth, throat, and face:   [] Earaches  []   Hearing loss   [] Hoarseness  [] Nasal congestion  [] Snoring  [] Tinnitus     []  Other:     Respiratory:    [] Cough    [] Shortness of breath    [] Chest pain    [] Other:     Cardiovascular:   [] Chest pain  [] Dyspnea    [] Exertional chest pressure/discomfort     [] Fatigue       [] Palpitations    [] Syncope   [] Other:     Gastrointestinal:   [] Abdominal pain   [x] Constipation   [] Diarrhea  [] Dysphagia   [] Reflux   [x] Vomiting  [] Other:     Genitourinary:  [] Dysuria     [] Frequency   [] Hematuria   [] Nocturia   [] Urinary incontinence    [] Other:     Hematologic/lymphatic:  [] Bleeding    [] Easy bruising   [] Petechiae   [] Other:     Musculoskeletal:   [] Back pain    [] Muscle weakness   [] Myalgias    [] Neck pain     [] Stiff joints    [] Other:     Neurological: As noted in HPI    Behavioral/Psych:   [x] Anxiety    [x] Depression     [] Mood swings   [] Other:     Endocrine:   [] Temperature intolerance     [] Fatigue    [] Other:     Allergic/Immunologic:   [] Hay fever    [] Other:       PHYSICAL EXAM    BP 130/82   Pulse 64   Ht 5\' 4"  (1.626 m)   Wt 124 lb (56.246 kg)   BMI 21.27 kg/m2    General Appearance:  well developed, well nourished, alert and cooperative  Skin:  Skin color, texture, turgor normal. No rashes or lesions.  Head/face:  tender-  bilateral-  frontal, occipital and temporal region  Eyes:  PERRL and EOMI  Nose/Sinuses:  Nares normal. Septum midline. Mucosa normal. No drainage or sinus tenderness.  Mouth/Throat:  Mucosa moist.  No lesions.  Pharynx without erythema, edema or exudate.  Neck:  neck- supple, no mass, non-tender and no bruits  Lungs: Clear to auscultation.  No wheezing.  Heart:  Heart sounds are normal.  Regular rate and rhythm without murmur.  Extremities: Extremities warm to touch with no edema.  Musculoskeletal:  See neurologic exam.    NEUROLOGIC:  Mental Status Exam: Patient is alert, oriented to person, place and time. Memory,fund of Knowledge, attention/concentratione is normal. Speech and Language is normal  Funduscopic Exam: sharp disc margins  Cranial Nerves:   II: Visual fields:  Full to  confrontation  III: Pupils:  equal, round, reactive to light  III,IV,VI: Extra Ocular Movements are intact. No nystagmus  V: Facial sensation is intact to pin prick and light touch  VII: Facial strength and movements: intact and symmetric smile,cheek puffing and eyebrow elevation  VIII: Hearing:  Intact to finger rub bilaterally  IX: Palate  elevation is symmetric  XI: Shoulder shrug is intact  XII: Tongue movements are normal  Motor:  Muscle tone and bulk are normal. Strength is symmetrical 5/5 in all four extremities.  Sensory: Intact to light touch, pin prick, vibration and proprioception.  Coordination:  Intact to Finger/Nose and Heel-Knee-Shin bilaterally    Rapid Alternating and fine finger movements are normal.  Reflexes:  DTR +2 and symmetric bilaterally  Plantar response: Flexor bilaterally  Gait: Gait and station is normal. Patient can toe/ heel and tandem walk without difficulty  Romberg: negative    ASSESSMENT/PLAN  1.  Chronic migraine without aura, with intractable migraine, so stated, without mention of status migrainosus  eletriptan (RELPAX) 40 MG tablet    Diclofenac Potassium (CAMBIA) 50 MG PACK   2. Medication overuse headache       CO EXISTING DISORDERS: Osteoarthritis of lumbar spine, high cholesterol  FACTORS AFFECTING MANAGEMENT: Poor insight into headache, noncompliance with medication, lifestyle factors.    A detailed history and general/neurologic examination suggest above diagnosis. She has a history of chronic migraine which are intractable and is in medication overuse headache. Temporal arteritis has been excluded. She did not have any response to steroids.    I explained to Linda [name] the diagnosis and possible factors contributing to the current pattern of headache. Linda [name] was provided with verbal and printed information regarding headache mechanisms, diet, triggers, sleep, exercise and medication.      I emphasized lifestyle factors and the importance of avoidance of triggers, regular  sleep, sleep hygiene, eating meals regularly, regular exercise (30 minutes a day, about 4 to 5 days per week), massage therapy, adequate hydration (about 64 ounces of fluid per day), effects of caffeine and hence its avoidance and role of relaxation in management of headache. Importance of Biofeedback in headache management.    Requested tests: At this point, I do not believe that neuroimaging studies are indicated.  Should the symptoms change or not respond to treatment, imaging studies can be performed at a later date.    For acute treatment, I recommend  Take phenergan for nausea, try Cambia and Relpax. She was advised that Triptans work best when taken at headache onset. A second dose may be repeated in 2 hours as needed with a maximum of 2 doses in a 24 hour period. The use should be limited to 10 times per month to avoid the risk of rebound headaches. Side effects of triptans were discussed including but not limited to drowsiness, dizziness, chest and or throat tightness and pressure, nausea, dry mouth, feeling of tiredness, menses or fatigue, tingling, flushing, warm/cold sensation. Patient was advised that this class of medication is not used with History, symptoms, or signs of ischemic cardiac, cerebrovascular, peripheral vascular syndrome or other significant underlying cardiovascular disease.    She was advised to stop the prednisone as she has not been consistent with that and hydromorphone due to risk of rebound headache.    For preventive treatment I recommend, to restart Topamax 25 mg a day and titrate by 25 increments each week to total daily dose of 50 mg twice a day.  Side-effects of topamax were discussed including but not limited to suicidal risk, cognitive side effects, weight loss, sedation, nausea, altered taste, risk of kidney stones, glaucoma, paresthesias in the extremities.  She was advised to taper her caffeine intake gradually to avoid risk of rebound headaches.    Non Pharmacological  Intervention: Deep Breathing and progressive muscle relaxation and neck stretching exercises    The importance of maintaining a regular headache diary was emphasized which will be reviewed next visit.      Linda [name] was instructed to go to the Emergency Department, if there are any significant changes in headache or any new or different neurological signs or symptoms are experienced.     I plan to see Linda [name] in 4 week(s) or earlier if any problems arise.    All above information was reviewed by me this visit 02/27/2013 and is up to date and accurate. More than 50% of the total time of the visit was spent in  counseling and coordination of care. Total time spent:  60 minutes.    Instructions given to patient and plan included:     Patient Instructions   Many prescription and over the counter medications may make you drowsy. Do not drive or undertake hazardous activities if you are drowsy or otherwise compromised.    Preventive medications for headache prescribed to you help to reduce both headache frequency and intensity. These medications are to be taken daily as prescribed.    Abortive medications for headache prescribed to you are to be taken only as necessary within the limits prescribed.    Eat your meals on time and do not skip meals.    Maintain a regular sleep schedule.    Get regular exercise. Neck stretching exercises.    Limit caffeine intake (8oz limit/day)    Avoid Alcohol.    Drink 8 to 10 glasses of water per day.    Continue current medications.     Relaxation on a regular basis     Keep Headache Diary and bring to next visit. Remember that the treatment to your problem is a team approach and you are the most important part of the team.     Please call with any questions or concerns at [phone].  If you experience any new or different neurological signs or symptoms, please go to the emergency room.

## 2013-02-27 NOTE — Patient Instructions (Signed)
Many prescription and over the counter medications may make you drowsy. Do not drive or undertake hazardous activities if you are drowsy or otherwise compromised.    Preventive medications for headache prescribed to you help to reduce both headache frequency and intensity. These medications are to be taken daily as prescribed.    Abortive medications for headache prescribed to you are to be taken only as necessary within the limits prescribed.    Eat your meals on time and do not skip meals.    Maintain a regular sleep schedule.    Get regular exercise. Neck stretching exercises.    Limit caffeine intake (8oz limit/day)    Avoid Alcohol.    Drink 8 to 10 glasses of water per day.    Continue current medications.     Relaxation on a regular basis     Keep Headache Diary and bring to next visit. Remember that the treatment to your problem is a team approach and you are the most important part of the team.     Please call with any questions or concerns at [phone].  If you experience any new or different neurological signs or symptoms, please go to the emergency room.

## 2013-03-02 ENCOUNTER — Telehealth

## 2013-03-02 NOTE — Telephone Encounter (Signed)
Patients vm is full but there was an option to put in the phone number

## 2013-03-02 NOTE — Telephone Encounter (Signed)
Patient seen Dr. Tim Lairontractor on Friday and was told to call Today with how her headaches were and she had one all weekend and today.........she took the medication and she had gotten no relief if you can please call her at [phone]

## 2013-03-03 MED ORDER — PAROXETINE HCL 20 MG PO TABS
20 MG | ORAL_TABLET | ORAL | Status: DC
Start: 2013-03-03 — End: 2014-06-21

## 2013-03-03 NOTE — Telephone Encounter (Signed)
Please advise.

## 2013-03-03 NOTE — Telephone Encounter (Signed)
Call Documentation   Mindy Yochum at 03/03/2013 12:46 PM   Status: Signed  Patient is calling with same problem and would like a return phone call please you can reach her at [phone]   ---------------------------------------------------------------------------------------------------------------------------------------------------  Her migraine is the same today as what it was Friday she said that she had the same migraine all weekend.  She said that she is out of her sample meds that she was told to try.   She said that Steward DroneBrenda was going to talk to Dr. Tim Lairontractor about the topamax then call into the pharmacy for 25 mg and she said that it didn't get called in.  She said that it was handwritten at the top of her sheet (every day ) topamax 25 mg bed - 1wk & 25 am hs x1 wk & 50 mg she said that she can't decipher anything else that is handwritten with the instructions.   She is out of cambia & relpax gave temporary relief but still had migraine  Tried benadryl at 4:30 pm on Sunday.   PT is calling pharmacy to check on how much Verlin FesterCambia is at her local pharmacy.

## 2013-03-03 NOTE — Telephone Encounter (Signed)
See other note

## 2013-03-03 NOTE — Telephone Encounter (Signed)
Pt asked how to taper up topamax. I wrote on pt's plan how to increase dose. Pt needed new rx because when she showed me the topamax bottle, it contained 3 different types of pills. Pt did not know what meds they were.

## 2013-03-03 NOTE — Telephone Encounter (Signed)
Pt requesting a refill for her PARoxetine (PAXIL) 20 MG tablet Two and half tabs daily, pt uses [facility] at [phone].

## 2013-03-03 NOTE — Telephone Encounter (Signed)
Patient is calling with same problem and would like a return phone call please you can reach her at [phone]

## 2013-03-03 NOTE — Telephone Encounter (Signed)
Ok to refill

## 2013-03-03 NOTE — Telephone Encounter (Signed)
E-scribed to pharm.

## 2013-03-05 MED ORDER — ELETRIPTAN HYDROBROMIDE 40 MG PO TABS
40 MG | ORAL_TABLET | Freq: Once | ORAL | Status: DC | PRN
Start: 2013-03-05 — End: 2013-04-14

## 2013-03-05 MED ORDER — TOPIRAMATE 25 MG PO TABS
25 MG | ORAL_TABLET | ORAL | Status: DC
Start: 2013-03-05 — End: 2013-07-10

## 2013-03-05 MED ORDER — DICLOFENAC POTASSIUM(MIGRAINE) 50 MG PO PACK
50 | ORAL | Status: DC | PRN
Start: 2013-03-05 — End: 2013-03-09

## 2013-03-05 NOTE — Telephone Encounter (Signed)
I spoke to [place] who [place] the [place] .   She said she would [facility] the PA FOR THE 3 A DAY.  Called and notified the pt.

## 2013-03-05 NOTE — Telephone Encounter (Signed)
I called and spoke to the pharmacist he said Insurance won't cover the 20 mg for 2.5 tabs a day.  He said it will cover a 40 mg tab daily.   Or we can switch the medicine completely- please advise.

## 2013-03-05 NOTE — Telephone Encounter (Signed)
That is an error  She is on Paxil 20mg  3 po daily #90 per month

## 2013-03-05 NOTE — Telephone Encounter (Signed)
Patient called to say that [phone] they told her our doctor called in 2 scripts: 1 for 20mg  of Paxil and 1 for 25mg  Paxil.  They said her insurance wouldn't allow this and she said she is only suppose to have the one for 20mg .  We need to contact [facility] and let them know that the Paxil 20mg  is the only one she should be getting, as they won't believe her.  Patient can be reached at [phone].

## 2013-03-06 ENCOUNTER — Telehealth

## 2013-03-06 NOTE — Telephone Encounter (Signed)
Patient called back and said that [facility] is faxing over for the second time a form that needs the doctor to state the reason she needs medication Cambien.  Her insurance will not allow this medication unless the doctor fills out this form and the patient states that she has had surgery and this is the reason for the medication.  Patient can be reached at [phone].

## 2013-03-06 NOTE — Telephone Encounter (Signed)
Patient is asking for a presciption for Cambia 50 mg 1 packet  By mouth prn # 9

## 2013-03-09 MED ORDER — DICLOFENAC POTASSIUM(MIGRAINE) 50 MG PO PACK
50 | ORAL | Status: DC | PRN
Start: 2013-03-09 — End: 2014-07-13

## 2013-03-09 NOTE — Telephone Encounter (Signed)
Mail box is full ... We need to call the [facility] and [facility].

## 2013-03-09 NOTE — Telephone Encounter (Signed)
PT [facility] to [facility]

## 2013-03-16 NOTE — Telephone Encounter (Signed)
This is a FYI they have left 4 messages for the patient to call regarding her pharmacy benefits for Cambia.................Marland Kitchen

## 2013-03-16 NOTE — Telephone Encounter (Signed)
lmov at home telling the patient that if she needs cambia to call the pharmacy

## 2013-03-27 MED ORDER — ATORVASTATIN CALCIUM 40 MG PO TABS
40 MG | ORAL_TABLET | ORAL | Status: DC
Start: 2013-03-27 — End: 2013-08-26

## 2013-04-14 MED ORDER — TOPIRAMATE 50 MG PO TABS
50 MG | ORAL_TABLET | ORAL | Status: DC
Start: 2013-04-14 — End: 2013-07-10

## 2013-04-14 MED ORDER — ELETRIPTAN HYDROBROMIDE 40 MG PO TABS
40 MG | ORAL_TABLET | Freq: Once | ORAL | Status: AC | PRN
Start: 2013-04-14 — End: 2013-04-14

## 2013-04-14 NOTE — Progress Notes (Signed)
04/14/2013    Linda [name] D.O.B. Aug 26, 1945    Chief Complaint   Patient presents with   ??? Migraine     follow up        Linda presents for follow up today and reports that the headaches are improved in frequency and improved in intensity. Linda did bring the calendar for review. She is now on Topamax 50 mg twice a day and tolerating it well without side effects. Her headache calendars showed that when she started 50 mg twice a day dosing, headaches significantly improved in frequency and intensity. Relpax and Cambia together works well as an acute treatment but does not work well when taken individually. She has gained some weight since last visit but attributes it to Paxil.     Linda states that the pain is located in the right side and templesand describes the pain as constant and pounding, ranging in severity from 3 to 6 /10 intensity, lasting 1 hour(s) to 4 hour(s).  Common Triggers ZOX:WRUEA such as MSG, pizza    Associated symptoms include: nausea with vomiting, photophobia, phonophobia and osmophobia.  Current abortive treatment includes Relpax, Cambia and Linda reports   relief in 1 hour to 4 hour(s) with medication.   Preventive treatment includes: none  Medication side effects: none    The patient has had no ER/ Urgent care visits since last follow up.     Headache History over past 28 days:  In the past month Linda reports:  Severe headaches: 5 days     Moderate headaches: 3 days  Mild headaches: 1 days   No headaches: 19 days    HIT-6 Score (Headache Impact Test):  64    LIFESTYLE AND MOOD REVIEW:   reports that she quit smoking about 15 years ago. She has never used smokeless tobacco.   reports that  drinks alcohol.  Exercise: 1 per week.  Caffeine:  4 cups of caffeinated coffee/soda per day(s)  In counseling or psychotherapy: no.    Mood: Neutral     Panic Attacks: no .  Sleep: 8 to 9 per night.  Menses: postmenopausal     HISTORY REVIEW:  Past Medical History   Diagnosis Date   ???  Postmenopausal    ??? Dyspepsia      nonulcerative   ??? Colon polyps      adenomatous    ??? Thoracic compression fracture (HCC)      T10 from a fall   ??? 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 Migraine      recurrent   ??? History of depression    ??? Osteoarthritis of lumbar spine      Chunduri   ??? High cholesterol        Current Outpatient Prescriptions on File Prior to Visit   Medication Sig Dispense Refill   ??? atorvastatin (LIPITOR) 40 MG tablet TAKE 1 TABLET BY MOUTH DAILY  30 tablet  1   ??? Diclofenac Potassium (CAMBIA) 50 MG PACK Take 1 packet by mouth as needed.  9 each  3   ??? PARoxetine (PAXIL) 20 MG tablet Two and half tabs daily  150 tablet  3   ??? Coenzyme Q10 (CO Q 10) 100 MG CAPS Take 200 mg by mouth.       ??? Ascorbic Acid (VITAMIN C) 250 MG tablet Take 1,000 mg by mouth daily.       ??? Biotin 5000 MCG CAPS Take  by mouth.       ???  Omega-3 Fatty Acids (FISH OIL) 500 MG CAPS Take 500 mg by mouth 2 times daily.       ??? Calcium Carbonate-Vitamin D (CALTRATE 600+D) 600-400 MG-UNIT CHEW Take 1 tablet by mouth 2 times daily.  60 tablet  11   ??? topiramate (TOPAMAX) 25 MG tablet 1 tab at bedtime x1 week, then 1 tab in am and bedtime x 1 week, then 1 tab in am and 2 tab at bedtime x 1 week, then 2 tablets 2 x per day.  120 tablet  1   ??? butalbital-acetaminophen-caffeine (FIORICET, ESGIC) per tablet Take 1 tablet by mouth every 4 hours as needed for Pain.       ??? HYDROmorphone (DILAUDID) 4 MG tablet One po q4hr prn pain  40 tablet  0   ??? predniSONE (DELTASONE) 10 MG tablet 6 tabs po qam  180 tablet  0   ??? rizatriptan (MAXALT) 10 MG tablet Take 1 tablet by mouth once as needed for Migraine for 1 dose. May repeat in 2 hours if needed  30 tablet  3     No current facility-administered medications on file prior to visit.       Allergies   Allergen Reactions   ??? Actonel [Risedronate Sodium]      Osteonecrosis jaw documented       I have reviewed pertinent family history and social history as documented in  the patient's electronic medical record.     REVIEW OF SYSTEMS:   In addition to as noted in the HPI, the following ROS was obtained. Positives are marked. All others are negative .    Constitutional:  []    Chills   [x]   Fatigue   []   Fevers   []   Malaise   []   Weight loss   [x]  Other: wt gain-paxil   Eyes:   []   Double vision   []   Blurry vision   []  Other:     Ears, nose, mouth, throat, and face:   []   Earaches  []  Hearing loss    []    Hoarseness    []   Nasal congestion    []   Snoring    []   Tinnitus     []  Other:     Respiratory:   []   Cough    []   Shortness of breath    []   Chest pain    []  Other:     Cardiovascular:   []   Chest pain  []   Dyspnea    []   Exertional chest pressure/discomfort     []  Fatigue      []   Palpitations    []   Syncope   []  Other:     Gastrointestinal:   []   Abdominal pain   [x]   Constipation    []   Diarrhea    []    Dysphagia   []   Reflux             []   Vomiting      []  Other:     Genitourinary:  []   Dysuria     []   Frequency   []   Hematuria   []  Nocturia   [x]   Urinary incontinence         []  Other:     Hematologic/lymphatic:  []   Bleeding    []   Easy bruising   []   Petechiae   []  Other:     Musculoskeletal:   [] Back pain    [] Muscle weakness   [] Myalgias    []   Neck pain     [] Stiff joints     []  Other:     Neurological: As noted in HPI    Behavioral/Psych:   [x]  Anxiety    [x]   Depression     [x]   Mood swings   []  Other:     Endocrine:   [x]   Temperature intolerance     [x]  Fatigue    []  Other:     Allergic/Immunologic:   []  Hay fever    []  Other:       PHYSICAL EXAM    BP 120/80   Pulse 60   Ht 5\' 4"  (1.626 m)   Wt 129 lb (58.514 kg)   BMI 22.13 kg/m2    General Appearance:  Well developed, well nourished, alert and cooperative  Skin:  Skin color, texture, turgor normal. No rashes or lesions.  Head: Atraumatic  Eyes:  See Neurologic exam  Neck:  no bruits  Heart:  Heart sounds are normal.  Regular rate and rhythm without murmur.  Extremities: Extremities warm to touch with no  edema.  Musculoskeletal: not tender-  bilateral-  frontal, occipital and temporal region    NEUROLOGIC:  Mental Status Exam: Patient is alert, oriented to person, place and time. Memory, fund of Knowledge, attention/concentration is normal. Speech and Language is normal  Cranial Nerves:   III: Pupils:  equal, round, reactive to light  III,IV,VI: Extra Ocular Movements are intact  V: Facial sensation is intact to pin prick and light touch  VII: Facial strength and movements are intact and symmetric.  XI: Shoulder shrug is intact  Motor:  Strength is symmetrical 5 out of 5 all extremities bilaterally  Reflexes:  DTR +2 and symmetric bilaterally  Gait: Gait and station is normal.    ASSESSMENT/PLAN  1. Chronic migraine without aura, with intractable migraine, so stated, without mention of status migrainosus  eletriptan (RELPAX) 40 MG tablet    topiramate (TOPAMAX) 50 MG tablet   2. Medication overuse headache  eletriptan (RELPAX) 40 MG tablet    topiramate (TOPAMAX) 50 MG tablet   3. GERD (gastroesophageal reflux disease)         Linda's headaches have improved in frequency and intensity  For acute treatment: I suggest to continue with Cambia and Relpax    For preventive treatment: I suggest to increase Topamax by 25 mg increments each week to total daily dose of 100 mg twice a day. She was advised of the side effects again and told to call the office if she experiences any during the titration phase.    Headache Education:  Lifestyle factors and management and avoidance of triggers discussed.   Treatment of headaches at onset was emphasized.  Limit acute treatment to 10 days per month to avoid risk of rebound headaches.   Side- effects of medications was discussed.   Non-Pharmacologic Interventions: Regular Exercise, Relaxation, Hydration, regular and adequate sleep.  Importance of keeping a headache calendar on a regular basis.    Patient was advised to go to the Emergency Department, if there are any new or  worsening neurological signs or symptoms.    Follow up in 2 month(s).    All above information was reviewed by me this visit 04/14/2013 and is up to date and accurate.

## 2013-04-14 NOTE — Patient Instructions (Signed)
Please call with any questions or concerns at [phone].  If you experience any new or different neurological signs or symptoms, please go to the emergency room.

## 2013-04-29 MED ORDER — DETROL LA 4 MG PO CP24
4 MG | ORAL_CAPSULE | ORAL | Status: DC
Start: 2013-04-29 — End: 2013-08-26

## 2013-07-10 MED ORDER — DICLOFENAC POTASSIUM(MIGRAINE) 50 MG PO PACK
50 MG | ORAL | Status: DC | PRN
Start: 2013-07-10 — End: 2014-01-12

## 2013-07-10 MED ORDER — TOPIRAMATE 100 MG PO TABS
100 MG | ORAL_TABLET | Freq: Two times a day (BID) | ORAL | Status: DC
Start: 2013-07-10 — End: 2014-01-12

## 2013-07-10 MED ORDER — ELETRIPTAN HYDROBROMIDE 40 MG PO TABS
40 MG | ORAL_TABLET | Freq: Once | ORAL | Status: DC | PRN
Start: 2013-07-10 — End: 2015-03-25

## 2013-07-10 NOTE — Patient Instructions (Signed)
Please call with any questions or concerns at [phone].  If you experience any new or different neurological signs or symptoms, please go to the emergency room.

## 2013-07-10 NOTE — Progress Notes (Signed)
07/10/2013    Deboraha Marcos D.O.B. 04/08/1946    Chief Complaint   Patient presents with   ??? Migraine     follow up        Deboraha presents for follow up today and reports that the headaches are improved in frequency and improved in intensity. Deboraha did bring the calendar for review. She was able to increase Topamax 200 mg twice a day and has noted significant improvement in headache frequency. This month, she had a cluster of headaches and does not know what the trigger was. However, she is able to abort her headache with Cambia and Relpax. Her mood is good. She is on Paxil 50 mg a day without side effects.    Deboraha states that the pain is located in the right side and templesand describes the pain as pressure and constant, ranging in severity from 3 to 8 /10 intensity, lasting 1 hour(s) to 2 hour(s).  Common Triggers ZOX:WRUE    Associated symptoms include: nausea without vomiting, photophobia and phonophobia.  Current abortive treatment includes cambia  and Relpax Deboraha reports relief in 1 hour to 2 hour(s) with medication.   Preventive treatment includes: topamax 100 mg BID  Medication side effects: no     The patient has had no ER/ Urgent care visits since last follow up.     Headache History over past 28 days:  In the past month Deboraha reports:  Severe headaches: 0 days     Moderate headaches: 4 days  Mild headaches: 0 days   No headaches: 0 days    HIT-6 Score (Headache Impact Test):  60    LIFESTYLE AND MOOD REVIEW:   reports that she quit smoking about 15 years ago. She has never used smokeless tobacco.   reports that she drinks alcohol.  Exercise: 3 per week.  Caffeine:  8-12 oz of coffee ,soda 7 days per week   In counseling or psychotherapy: no.    Mood: Neutral     Panic Attacks: no .  Sleep: 8 to 10 per night.    HISTORY REVIEW:  Past Medical History   Diagnosis Date   ??? Postmenopausal    ??? Dyspepsia      nonulcerative   ??? Colon polyps      adenomatous    ??? Thoracic compression fracture  (HCC)      T10 from a fall   ??? 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 Migraine      recurrent   ??? History of depression    ??? Osteoarthritis of lumbar spine      Chunduri   ??? High cholesterol        Current Outpatient Prescriptions on File Prior to Visit   Medication Sig Dispense Refill   ??? DETROL LA 4 MG ER capsule TAKE ONE CAPSULE EVERY DAY  90 capsule  0   ??? atorvastatin (LIPITOR) 40 MG tablet TAKE 1 TABLET BY MOUTH DAILY  30 tablet  1   ??? Diclofenac Potassium (CAMBIA) 50 MG PACK Take 1 packet by mouth as needed.  9 each  3   ??? PARoxetine (PAXIL) 20 MG tablet Two and half tabs daily  150 tablet  3   ??? Coenzyme Q10 (CO Q 10) 100 MG CAPS Take 200 mg by mouth.       ??? Ascorbic Acid (VITAMIN C) 250 MG tablet Take 1,000 mg by mouth daily.       ??? Biotin 5000 MCG  CAPS Take  by mouth.       ??? Omega-3 Fatty Acids (FISH OIL) 500 MG CAPS Take 500 mg by mouth 2 times daily.       ??? Calcium Carbonate-Vitamin D (CALTRATE 600+D) 600-400 MG-UNIT CHEW Take 1 tablet by mouth 2 times daily.  60 tablet  11   ??? eletriptan (RELPAX) 40 MG tablet Take 1 tablet by mouth once as needed for up to 1 dose. may repeat in 2 hours if necessary. Maximum of 2 doses in a 24 hour period.  9 tablet  5     No current facility-administered medications on file prior to visit.       Allergies   Allergen Reactions   ??? Actonel [Risedronate Sodium]      Osteonecrosis jaw documented       I have reviewed pertinent family history and social history as documented in the patient's electronic medical record.     REVIEW OF SYSTEMS:   In addition to as noted in the HPI, the following ROS was obtained. Positives are marked. All others are negative .    Constitutional:  []    Chills   [x]   Fatigue   []   Fevers   []   Malaise   []   Weight loss   []  Other:      Eyes:   []   Double vision   []   Blurry vision   []  Other:     Ears, nose, mouth, throat, and face:   []   Earaches  []  Hearing loss    []    Hoarseness    []   Nasal congestion    []   Snoring     []   Tinnitus     []  Other:     Respiratory:   []   Cough    []   Shortness of breath    []   Chest pain    []  Other:     Cardiovascular:   []   Chest pain  []   Dyspnea    []   Exertional chest pressure/discomfort     []  Fatigue      []   Palpitations    []   Syncope   []  Other:     Gastrointestinal:   []   Abdominal pain   [x]   Constipation    []   Diarrhea    []    Dysphagia   []   Reflux             []   Vomiting      []  Other:     Genitourinary:  []   Dysuria     []   Frequency   []   Hematuria   []  Nocturia   [x]   Urinary incontinence         []  Other:     Hematologic/lymphatic:  []   Bleeding    []   Easy bruising   []   Petechiae   []  Other:     Musculoskeletal:   [] Back pain    [] Muscle weakness   [] Myalgias    [] Neck pain     [] Stiff joints     []  Other:     Neurological: As noted in HPI    Behavioral/Psych:   []  Anxiety    [x]   Depression     []   Mood swings   []  Other:     Endocrine:   []   Temperature intolerance     []  Fatigue    []  Other:     Allergic/Immunologic:   []  Hay fever    []   Other:       PHYSICAL EXAM    BP 106/76   Pulse 80   Ht 5\' 4"  (1.626 m)   Wt 131 lb (59.421 kg)   BMI 22.47 kg/m2    General Appearance:  Well developed, well nourished, alert and cooperative  Skin:  Skin color, texture, turgor normal. No rashes or lesions.  Head: Atraumatic  Eyes:  See Neurologic exam  Neck:  no bruits  Heart:  Heart sounds are normal.  Regular rate and rhythm without murmur.  Extremities: Extremities warm to touch with no edema.  Musculoskeletal: tender-  bilateral-  frontal, occipital and temporal region    NEUROLOGIC:  Mental Status Exam: Patient is alert, oriented to person, place and time. Memory, fund of Knowledge, attention/concentration is normal. Speech and Language is normal  Cranial Nerves:   III: Pupils:  equal, round, reactive to light  III,IV,VI: Extra Ocular Movements are intact  V: Facial sensation is intact to pin prick and light touch  VII: Facial strength and movements are intact and symmetric.  XI:  Shoulder shrug is intact  Motor:  Strength is symmetrical 5 out of 5 all extremities bilaterally  Reflexes:  DTR +2 and symmetric bilaterally  Gait: Gait and station is normal.    ASSESSMENT/PLAN  1. Chronic migraine without aura, with intractable migraine, so stated, without mention of status migrainosus  eletriptan (RELPAX) 40 MG tablet    topiramate (TOPAMAX) 100 MG tablet    Diclofenac Potassium (CAMBIA) 50 MG PACK   2. Medication overuse headache     3. Depression         Deboraha's headaches have improved significantly. She is not in medication overuse headache now. Her mood has been stable.  For acute treatment: I suggest to continue with Relpax and Cambia. The combination treatment works well for her.    For preventive treatment: I suggest to continue with Topamax 100 mg twice a day. Continue with current vitamins.  She was advised that she will be monitored closely while on triptans given her age. She is scheduled to see Dr. Eliseo Squires in the near future.    Headache Education:  Lifestyle factors and management and avoidance of triggers discussed.   Treatment of headaches at onset was emphasized.  Limit acute treatment to 10 days per month to avoid risk of rebound headaches.   Non-Pharmacologic Interventions: Regular Exercise, Relaxation, Hydration, regular and adequate sleep.  Importance of keeping a headache calendar on a regular basis.    Patient was advised to go to the Emergency Department, if there are any new or worsening neurological signs or symptoms.    Follow up in 6 month(s).    All above information was reviewed by me this visit 07/10/2013 and is up to date and accurate.

## 2013-08-26 MED ORDER — ATORVASTATIN CALCIUM 40 MG PO TABS
40 MG | ORAL_TABLET | ORAL | Status: DC
Start: 2013-08-26 — End: 2014-09-07

## 2013-08-26 MED ORDER — TOLTERODINE TARTRATE ER 4 MG PO CP24
4 MG | ORAL_CAPSULE | ORAL | Status: DC
Start: 2013-08-26 — End: 2014-06-23

## 2013-10-08 MED ORDER — ATORVASTATIN CALCIUM 40 MG PO TABS
40 MG | ORAL_TABLET | ORAL | Status: DC
Start: 2013-10-08 — End: 2014-07-13

## 2013-12-19 NOTE — Other (Unsigned)
CURRENT STATUS IS EMERGENCY .      :     Any questions regarding BILLING should be directed to the Care Management   Departments: [facility] [phone] or [facility]   [phone].         _________________________________  Signed byShelly Coss  NOTIFY    ZZ    D: 12/19/2013 12:42 PM  T:    This document is confidential medical information.  Unauthorized disclosure or   use of this information is prohibited by law.  If you are not the intended recipient of this document, please advise [place] by   calling immediately [phone].

## 2013-12-20 NOTE — Other (Unsigned)
[facility] ED PATIENT ENCOUNTER   ARRIVAL: 12/19/13 1242 CSN: [id number]     HAR: [id number]     History     Chief Complaint Patient presents with   Knee Injury   pt slipped 24 hours ago   while doing yard work, left knee buckled underneath her     HPI This is a 68 year old female a chief complaint of left calf pain. Patient   states that she slipped while doing yard work yesterday. Patient has had pain   in her calf since then. Patient states the pain is a sharp constant pain 6/10   intensity. Patient states the pain is worse with movement. Past Medical   History Diagnosis Date   Hyperlipemia   Anxiety   Depression   GERD   (gastroesophageal reflux disease)   Migraine   Osteoporosis     Past Surgical History Procedure Laterality Date   Back surgery     Prior to Admission medications Medication Sig Start Date End Date Taking?   Authorizing Provider lansoprazole (PREVACID) 30 MG CPDR Take 30 mg by mouth 2   (two) times daily. Yes Historical Med paroxetine (PAXIL) 20 MG TABS Take 20 mg   by mouth every morning.     Yes Historical Med rosuvastatin (CRESTOR) 20 MG   TABS Take 20 mg by mouth daily.     Yes Historical Med topiramate (TOPAMAX) 25   MG TABS Take 25 mg by mouth 2 (two) times daily.    Yes Historical Med   oxycodone-acetaminophen (PERCOCET) 5-325 MG TABS Take 1-2 tablets by mouth   every 6 (six) hours as needed. 08/09/11 12/19/13  Maren Beach., MD     No Known Allergies     No family history on file.     History Substance Use Topics   Smoking status: Never Smoker   Smokeless   tobacco: Not on file   Alcohol Use: Yes    Comment: socially     Review of Systems Constitutional: Negative for fever. Genitourinary: Negative   for dysuria. All other systems reviewed and are negative.     Physical Exam BP 116/69    Pulse 73    Temp(Src) 98.6  F (37  C) (Oral)      Resp 18    Ht 64" (162.6 cm)    Wt 133 lb (60.328 kg)    BMI 22.82 kg/m2        SpO2 97%     Physical Exam  Constitutional: She appears well-developed and well-nourished.   HENT: Head: Normocephalic and atraumatic. Eyes: Conjunctivae are normal. Neck:   Neck supple. Cardiovascular: Normal rate and regular rhythm. Pulmonary/Chest:   Effort normal and breath sounds normal. Abdominal: Soft. Bowel sounds are   normal. She exhibits no distension. There is no tenderness. Musculoskeletal:   Patient is nontender over the left calf. The patient has a cold left foot. I   do not feel pulses in the dorsalis pedis or posterior tibial pulses   Neurological: She is alert. Skin: Skin is warm and dry. Nursing note and   vitals reviewed.     ED Course Procedures     MDM Number of Diagnoses or Management Options Calf pain, left: new and   requires workup     Amount and/or Complexity of Data Reviewed Clinical lab tests: ordered and   reviewed Tests in the radiology section of CPT : ordered and reviewed     Risk of Complications, Morbidity,  and/or Mortality Presenting problems: high   Diagnostic procedures: high Management options: high     Patient Progress Patient progress: stable         XR TIBIA FIBULA LEFT AP AND LATERAL Final Ravina Milner HISTORY:   leg pain  S/P   FALL, PAIN BELOW KNEE AND DOWN, PT ALSO HAS "COLD" FOOT     COMPARISON: None     FINDINGS: No fractures are visualized. The visualized joint spaces are   normal. The soft tissues appear normal.     IMPRESSION     Negative plain film study.         CT ANGIO ABD AORTA AND BILAT ILIOFEM RUNOFF W AND OR WO CONT    (Results   Pending)     The patient was given the following medication in the Emergency department:     Medication Administration from 12/19/2013 1242 to 12/19/2013 1554      Date/Time Order Dose Route Action Action by Comments   12/19/2013 1403   ioversol (OPTIRAY 350) 74 % injection 100 mL 100 mL Intravenous Given Loma Sender   12/19/2013 1422 morphine injection 4 mg 4 mg Intravenous Given   [employer] Petrocelli     Patient presents to the ER with left calf pain after  a fall. Left tib-fib   showed no signs of fracture. I was concerned the patient may have vascular   insufficiency in that leg given the cold foot and no pulses. CTA abdomen with   runoff showed IV dye extending into the foot. Per radiology the contrast was   diluted. I'm concerned the patient has vascular insufficiency causing the   pain. Patient was referred to vascular surgery. Patient was given Vicodin for   pain.     Final Diagnosis:     1. Calf pain, left         The patient was given the following medications to go home with.     Discharge Medication List as of 12/19/2013  3:39 PM     START taking these medications  Details hydrocodone-acetaminophen (NORCO)   5-325 MG TABS Take 1-2 tablets by mouth every 6 (six) hours as needed.Print,   Disp-20 tablet, R-0             Follow-up Information  Follow up With Details Comments Mattawan, MD   [place] [address] [phone]      [address], [address] [place] [address] [phone]         Electronically signed by: Leonie Douglas. Kenton Kingfisher, MD         Lawana Chambers., MD 12/20/13 1857         _________________________________  Signed by:  Lawana Chambers  JEFFREY D. SPATZ    SP    D: 12/20/2013 06:57 PM  T: 12/20/2013 06:57 PM    This document is confidential medical information.  Unauthorized disclosure or   use of this information is prohibited by law.  If you are not the intended recipient of this document, please advise [place] by   calling immediately [phone].

## 2014-01-12 MED ORDER — AMOXICILLIN-POT CLAVULANATE 875-125 MG PO TABS
875-125 MG | ORAL_TABLET | Freq: Two times a day (BID) | ORAL | Status: AC
Start: 2014-01-12 — End: 2014-01-22

## 2014-01-12 MED ORDER — TOPIRAMATE 100 MG PO TABS
100 MG | ORAL_TABLET | Freq: Two times a day (BID) | ORAL | Status: DC
Start: 2014-01-12 — End: 2014-06-21

## 2014-01-12 NOTE — Progress Notes (Signed)
Subjective:      Patient ID: Linda [name] is a 68 y.o. female.    Otalgia     reports onset earfullness and pain on the left side and a dry cough with wheeze and worse at night for 10 + days.  No ottorhea.  Pos sore throat, no pnd.  Hasnt tried anything.    Review of Systems   HENT: Positive for ear pain.      No diarrhea , no nv, eating drinking  Bowels urinating ok  Objective:   Physical Exam   Constitutional: She is oriented to person, place, and time. She appears well-developed and well-nourished. No distress.   Very pale  Moving very slowly   HENT:   Head: Normocephalic and atraumatic.   Right Ear: External ear normal.   Left Ear: External ear normal.   Nose: Nose normal.   Mouth/Throat: Oropharynx is clear and moist.   Superior aspect of left TM is very red canal ok  Post pharynx with white exudate   Eyes: EOM are normal. Pupils are equal, round, and reactive to light. Right eye exhibits no discharge. Left eye exhibits no discharge.   Neck: Normal range of motion. Neck supple. No JVD present. No tracheal deviation present.   Cardiovascular: Normal rate, regular rhythm and normal heart sounds.  Exam reveals no gallop.    No murmur heard.  Pulmonary/Chest: Effort normal and breath sounds normal. No stridor. No respiratory distress.   Lymphadenopathy:     She has no cervical adenopathy.   Neurological: She is alert and oriented to person, place, and time. She has normal reflexes.   Skin: Skin is warm and dry. She is not diaphoretic.   Psychiatric: She has a normal mood and affect. Her behavior is normal. Judgment and thought content normal.   Nursing note and vitals reviewed.      Assessment:      Acute otitis media left ear and possible strept throat  Treat with augmentin 875 mg one po bid      Plan:      As above

## 2014-06-21 ENCOUNTER — Ambulatory Visit: Admit: 2014-06-21 | Discharge: 2014-06-21 | Payer: MEDICARE | Attending: Internal Medicine | Primary: Internal Medicine

## 2014-06-21 DIAGNOSIS — J4521 Mild intermittent asthma with (acute) exacerbation: Secondary | ICD-10-CM

## 2014-06-21 MED ORDER — ELETRIPTAN HYDROBROMIDE 40 MG PO TABS
40 MG | ORAL_TABLET | Freq: Once | ORAL | Status: DC | PRN
Start: 2014-06-21 — End: 2015-03-25

## 2014-06-21 MED ORDER — TOPIRAMATE 100 MG PO TABS
100 MG | ORAL_TABLET | Freq: Two times a day (BID) | ORAL | Status: DC
Start: 2014-06-21 — End: 2014-09-07

## 2014-06-21 MED ORDER — AMOXICILLIN 500 MG PO CAPS
500 MG | ORAL_CAPSULE | Freq: Three times a day (TID) | ORAL | Status: AC
Start: 2014-06-21 — End: 2014-07-01

## 2014-06-21 MED ORDER — PREDNISONE 10 MG PO TABS
10 MG | ORAL_TABLET | ORAL | Status: DC
Start: 2014-06-21 — End: 2014-07-13

## 2014-06-21 MED ORDER — ALBUTEROL SULFATE HFA 108 (90 BASE) MCG/ACT IN AERS
108 (90 Base) MCG/ACT | Freq: Four times a day (QID) | RESPIRATORY_TRACT | Status: DC | PRN
Start: 2014-06-21 — End: 2014-09-07

## 2014-06-21 MED ORDER — ALBUTEROL SULFATE HFA 108 (90 BASE) MCG/ACT IN AERS
108 (90 Base) MCG/ACT | Freq: Four times a day (QID) | RESPIRATORY_TRACT | Status: DC | PRN
Start: 2014-06-21 — End: 2014-06-21

## 2014-06-21 MED ORDER — DICLOFENAC POTASSIUM(MIGRAINE) 50 MG PO PACK
50 | ORAL | Status: DC | PRN
Start: 2014-06-21 — End: 2014-09-07

## 2014-06-21 MED ORDER — PAROXETINE HCL 20 MG PO TABS
20 MG | ORAL_TABLET | ORAL | Status: DC
Start: 2014-06-21 — End: 2014-09-07

## 2014-06-21 NOTE — Progress Notes (Signed)
Subjective:      Patient ID: Linda [name] is a 68 y.o. female.    HPI  With a history of asthma and passive exposure, husband smokes developed a uri one month ago and now it is in her chest with severe wheezing gets up every morning with sob and cough.  Her head hurts worse with this infection as well feels like her migraines  Review of Systems  Coughing is worseining her urinary continence  Objective:   Physical Exam   Constitutional: She is oriented to person, place, and time. She appears well-developed and well-nourished. No distress.   HENT:   Head: Normocephalic and atraumatic.   Right Ear: External ear normal.   Left Ear: External ear normal.   Nose: Nose normal.   Mouth/Throat: Oropharynx is clear and moist.   Eyes: EOM are normal. Pupils are equal, round, and reactive to light. Right eye exhibits no discharge. Left eye exhibits no discharge.   Neck: Normal range of motion. Neck supple. No JVD present. No tracheal deviation present. No thyromegaly present.   Cardiovascular: Normal rate, regular rhythm and normal heart sounds.  Exam reveals no gallop.    No murmur heard.  Pulmonary/Chest: Effort normal and breath sounds normal. No stridor. No respiratory distress.   Expiratory wheeze and rhonchi   Abdominal: Soft. Bowel sounds are normal. She exhibits no distension. There is no tenderness.   Musculoskeletal: Normal range of motion. She exhibits no edema.   Lymphadenopathy:     She has no cervical adenopathy.   Neurological: She is alert and oriented to person, place, and time. She has normal reflexes.   Skin: Skin is warm and dry. She is not diaphoretic.   Psychiatric: She has a normal mood and affect. Her behavior is normal. Judgment and thought content normal.   Nursing note and vitals reviewed.      Assessment:      1. Asthmatic bronchitis:  Try amoxicillin and prednisone taper and proventil  2. Will need flu vac  3. Stress incont discussed kegels      Plan:      As above  Migraines /relpax and cambia

## 2014-06-21 NOTE — Telephone Encounter (Signed)
Did you change her Detrol to make it stronger?   I couldn't find where you did

## 2014-06-21 NOTE — Telephone Encounter (Signed)
Pt requesting a refill for her inhaler and also the medication for her bladder needs to be called to the pharmacy, pt uses [facility] at [phone].    albuterol (PROVENTIL HFA) 108 (90 BASE) MCG/ACT inhaler Inhale 2 puffs into the lungs every 6 hours as needed for Wheezing

## 2014-06-23 MED ORDER — TOLTERODINE TARTRATE ER 4 MG PO CP24
4 MG | ORAL_CAPSULE | ORAL | Status: DC
Start: 2014-06-23 — End: 2014-09-07

## 2014-06-28 ENCOUNTER — Telehealth

## 2014-06-28 MED ORDER — LEVOFLOXACIN 500 MG PO TABS
500 MG | ORAL_TABLET | Freq: Every day | ORAL | Status: AC
Start: 2014-06-28 — End: 2014-07-08

## 2014-06-28 NOTE — Telephone Encounter (Signed)
Get cxr pa and lat TODAY  Change from amox to Levaquin 500mg  one a day X10   Stay on prednisone and proventil  Fu this week wed or fri

## 2014-06-28 NOTE — Telephone Encounter (Signed)
cxr ordered- pt states she will go tom.   She will p/u abx tom- it was e-scribed.   She is scheduled for Friday.

## 2014-06-28 NOTE — Telephone Encounter (Signed)
Pt states she saw Dr Garen Grams on 11/16 for coughing. Pt states she is not feeling any better and has been coughing up yellow/green phlegm since she has seen Dr Garen Grams. She states she cannot stop coughing and is wondering if Dr Garen Grams can prescribe her something else.      Pharmacy:  [facility] [id number] - [place], [place]

## 2014-06-29 ENCOUNTER — Inpatient Hospital Stay: Attending: Internal Medicine | Primary: Internal Medicine

## 2014-06-29 ENCOUNTER — Encounter

## 2014-06-29 ENCOUNTER — Encounter: Admit: 2014-06-29 | Primary: Internal Medicine

## 2014-06-29 DIAGNOSIS — R059 Cough, unspecified: Secondary | ICD-10-CM

## 2014-07-02 ENCOUNTER — Encounter: Attending: Internal Medicine | Primary: Internal Medicine

## 2014-07-13 ENCOUNTER — Ambulatory Visit: Admit: 2014-07-13 | Discharge: 2014-07-13 | Payer: MEDICARE | Attending: Internal Medicine | Primary: Internal Medicine

## 2014-07-13 DIAGNOSIS — R519 Headache, unspecified: Secondary | ICD-10-CM

## 2014-07-13 MED ORDER — PREDNISONE 10 MG PO TABS
10 MG | ORAL_TABLET | ORAL | Status: DC
Start: 2014-07-13 — End: 2014-09-07

## 2014-07-13 NOTE — Progress Notes (Signed)
Subjective:      Patient ID: Linda [name] is a 68 y.o. female.    HPIwith daily migraines who is up to 100mg  topamax bid with no affect.  She had stopped having migraines then they started again with a vengeance up to 3 in the last 10 days.  She took cambia and relpax and only got one hour of relief.  She has ongoing pain across the frontal aspect of her head.    She is very stressed out because her daughter is going thru a divorce and her grandchildren are just acting out.  They are spending a lot of money supporting them.    She is very stressed out over her husbands illness.  She is more depressed and only on paxil 40 instead of sixty right now.  [place] reviewed and updated     Review of Systems  She has asthma and her husband smokes cigarettes heavily and marijuana every night  She is having ongoing chest congestion  Left knee very tender medially worse with walking and throbs at night injured it six mos ago  Objective:   Physical Exam   Constitutional: She is oriented to person, place, and time. She appears well-developed and well-nourished. No distress.   Very pale   HENT:   Head: Normocephalic and atraumatic.   Right Ear: External ear normal.   Left Ear: External ear normal.   Nose: Nose normal.   Mouth/Throat: Oropharynx is clear and moist.   Eyes: EOM are normal. Pupils are equal, round, and reactive to light. Right eye exhibits no discharge. Left eye exhibits no discharge.   Neck: Normal range of motion. Neck supple. No JVD present. No tracheal deviation present. No thyromegaly present.   Cardiovascular: Normal rate, regular rhythm, normal heart sounds and intact distal pulses.  Exam reveals no gallop.    No murmur heard.  Pulmonary/Chest: Effort normal and breath sounds normal. No stridor. No respiratory distress.   Much clearer lungs and only wheeze on forced expiration   Abdominal: Soft. Bowel sounds are normal. She exhibits no distension. There is no tenderness.   Musculoskeletal: Normal range of  motion. She exhibits no edema.   Lymphadenopathy:     She has no cervical adenopathy.   Neurological: She is alert and oriented to person, place, and time. She has normal reflexes.   Tender over frontal   Skin: Skin is warm and dry. She is not diaphoretic.   Psychiatric: She has a normal mood and affect. Her behavior is normal. Judgment and thought content normal.   Nursing note and vitals reviewed.    Left knee tender medially neg mcmurray neg efffusion  Assessment:      1.Left knee probable torn medial meniscus: knee xray and mri ortho referra;  2.migraines : prednisone taper, increase paxil 60 mg per day stay on topamax, may need migraine specialist botox injections  3.  LLL pneumonia: recheck chest ct and see if the prednisone taper helps      Plan:      As above

## 2014-07-16 NOTE — Telephone Encounter (Signed)
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

## 2014-07-16 NOTE — Telephone Encounter (Signed)
LMOM

## 2014-07-16 NOTE — Telephone Encounter (Signed)
Is she going to her dentist?

## 2014-07-17 ENCOUNTER — Encounter

## 2014-07-17 ENCOUNTER — Encounter: Admit: 2014-07-17 | Primary: Internal Medicine

## 2014-07-17 ENCOUNTER — Inpatient Hospital Stay: Admit: 2014-07-17 | Attending: Internal Medicine | Primary: Internal Medicine

## 2014-07-17 DIAGNOSIS — R059 Cough, unspecified: Secondary | ICD-10-CM

## 2014-07-19 NOTE — Telephone Encounter (Signed)
LMOM with the below message.

## 2014-07-19 NOTE — Telephone Encounter (Signed)
Pt said the lock jaw is getting better. She didn' t go see her dentist but she said the pain is subsiding.    Pt is also wondering about imaging she had done- 07/17/14

## 2014-07-19 NOTE — Telephone Encounter (Signed)
Pt said she doesn't understand what that is and wants a better explanation.   She said she wants to know what she should do she is still coughing up gross stuff and having a hard time breathing and still feels like crap.

## 2014-07-19 NOTE — Telephone Encounter (Signed)
Hiatal hernia otherwise ok

## 2014-07-19 NOTE — Telephone Encounter (Signed)
Tried to call back   No pneumonia seen on chest ct   Hiatal hernia only    She could try Nexium 28mg  otc bid if she wants to try something otc to see if it works  Or we can refer her to DR Mariella Saa or Tamala Julian one of the lung docs

## 2014-07-20 NOTE — Telephone Encounter (Signed)
Patient would like to speak with Dr Garen Grams regarding message from yesterday, please call her on her cell phone [phone]

## 2014-08-20 ENCOUNTER — Encounter: Attending: Internal Medicine | Primary: Internal Medicine

## 2014-08-24 MED ORDER — ATORVASTATIN CALCIUM 40 MG PO TABS
40 MG | ORAL_TABLET | ORAL | Status: DC
Start: 2014-08-24 — End: 2014-09-07

## 2014-09-07 ENCOUNTER — Ambulatory Visit: Admit: 2014-09-07 | Discharge: 2014-09-07 | Payer: MEDICARE | Attending: Internal Medicine | Primary: Internal Medicine

## 2014-09-07 DIAGNOSIS — G43009 Migraine without aura, not intractable, without status migrainosus: Secondary | ICD-10-CM

## 2014-09-07 LAB — CBC WITH AUTO DIFFERENTIAL
Basophils %: 0.4 %
Basophils Absolute: 0 10*3/uL (ref 0.0–0.2)
Eosinophils %: 2 %
Eosinophils Absolute: 0.1 10*3/uL (ref 0.0–0.6)
Hematocrit: 40.6 % (ref 36.0–48.0)
Hemoglobin: 12.7 g/dL (ref 12.0–16.0)
Lymphocytes %: 33.9 %
Lymphocytes Absolute: 2.1 10*3/uL (ref 1.0–5.1)
MCH: 29.8 pg (ref 26.0–34.0)
MCHC: 31.1 g/dL (ref 31.0–36.0)
MCV: 95.8 fL (ref 80.0–100.0)
MPV: 8 fL (ref 5.0–10.5)
Monocytes %: 5.7 %
Monocytes Absolute: 0.4 10*3/uL (ref 0.0–1.3)
Neutrophils %: 58 %
Neutrophils Absolute: 3.7 10*3/uL (ref 1.7–7.7)
Platelets: 253 10*3/uL (ref 135–450)
RBC: 4.24 M/uL (ref 4.00–5.20)
RDW: 15.8 % — ABNORMAL HIGH (ref 12.4–15.4)
WBC: 6.3 10*3/uL (ref 4.0–11.0)

## 2014-09-07 LAB — LIPID PANEL
Cholesterol, Total: 227 mg/dL — ABNORMAL HIGH (ref 0–199)
HDL: 45 mg/dL (ref 40–60)
LDL Calculated: 150 mg/dL — ABNORMAL HIGH (ref <100)
Triglycerides: 159 mg/dL — ABNORMAL HIGH (ref 0–150)
VLDL Cholesterol Calculated: 32 mg/dL

## 2014-09-07 LAB — COMPREHENSIVE METABOLIC PANEL
ALT: 26 U/L (ref 10–40)
AST: 26 U/L (ref 15–37)
Albumin/Globulin Ratio: 1.4 (ref 1.1–2.2)
Albumin: 4.3 g/dL (ref 3.4–5.0)
Alkaline Phosphatase: 138 U/L — ABNORMAL HIGH (ref 40–129)
Anion Gap: 14 (ref 3–16)
BUN: 6 mg/dL — ABNORMAL LOW (ref 7–20)
CO2: 25 mmol/L (ref 21–32)
Calcium: 9.9 mg/dL (ref 8.3–10.6)
Chloride: 102 mmol/L (ref 99–110)
Creatinine: 0.8 mg/dL (ref 0.6–1.2)
GFR African American: >60 (ref >60)
GFR Non-African American: >60 (ref >60)
Globulin: 3 g/dL
Glucose: 82 mg/dL (ref 70–99)
Potassium: 4.3 mmol/L (ref 3.5–5.1)
Sodium: 141 mmol/L (ref 136–145)
Total Bilirubin: <0.2 mg/dL (ref 0.0–1.0)
Total Protein: 7.3 g/dL (ref 6.4–8.2)

## 2014-09-07 LAB — VITAMIN B12: Vitamin B-12: 174 pg/mL — ABNORMAL LOW (ref 211–911)

## 2014-09-07 LAB — TSH: TSH: 3.69 u[IU]/mL (ref 0.27–4.20)

## 2014-09-07 LAB — VITAMIN D 25 HYDROXY: Vit D, 25-Hydroxy: 27.8 ng/mL — ABNORMAL LOW (ref >=30)

## 2014-09-07 MED ORDER — TOPIRAMATE 100 MG PO TABS
100 MG | ORAL_TABLET | Freq: Two times a day (BID) | ORAL | Status: DC
Start: 2014-09-07 — End: 2014-09-16

## 2014-09-07 MED ORDER — DICLOFENAC POTASSIUM(MIGRAINE) 50 MG PO PACK
50 | ORAL | Status: DC | PRN
Start: 2014-09-07 — End: 2014-09-16

## 2014-09-07 MED ORDER — ALBUTEROL SULFATE HFA 108 (90 BASE) MCG/ACT IN AERS
108 (90 Base) MCG/ACT | Freq: Four times a day (QID) | RESPIRATORY_TRACT | Status: DC | PRN
Start: 2014-09-07 — End: 2014-09-16

## 2014-09-07 MED ORDER — OXYBUTYNIN CHLORIDE ER 10 MG PO TB24
10 MG | ORAL_TABLET | Freq: Every day | ORAL | Status: DC
Start: 2014-09-07 — End: 2014-09-16

## 2014-09-07 MED ORDER — ATORVASTATIN CALCIUM 40 MG PO TABS
40 MG | ORAL_TABLET | ORAL | Status: DC
Start: 2014-09-07 — End: 2014-09-16

## 2014-09-07 MED ORDER — PAROXETINE HCL 20 MG PO TABS
20 MG | ORAL_TABLET | ORAL | Status: DC
Start: 2014-09-07 — End: 2014-09-16

## 2014-09-07 NOTE — Progress Notes (Signed)
Subjective:      Patient ID: Linda [name] is a 69 y.o. female.    HPI  with daily migraines who is up to 100mg  topamax bid with no affect.  She had stopped having migraines then they started again with a vengeance up to 3 in the last 10 days.  She took cambia and relpax and only got one hour of relief.  She has ongoing pain across the frontal aspect of her head.    She is very stressed out because her daughter is going thru a divorce and her grandchildren are just acting out.  They are spending a lot of money supporting them.    She is very stressed out over her husbands illness.  She is more depressed and only on paxil 40 instead of sixty right now.  [place] reviewed and updated     She is feeling better knee no longer hurts, got a cortisone shot which worked.  Breathing is better.  Migraines are generally better.  Relpax and cambia work well together and rest for one hour gets rid of the headach.  Nausea periodically.  CTA abdo normal gallbladder was fine.  Tolerating Topamax 100mg  bid.  Now thinks headaches down to one a week  She worries about having cancer.    Review of Systems    She has asthma and her husband smokes cigarettes heavily and marijuana every night  She is having ongoing chest congestion  Left knee very tender medially worse with walking and throbs at night injured it six mos ago  Objective:   Physical Exam   Constitutional: She is oriented to person, place, and time. She appears well-developed and well-nourished. No distress.   Very pale   HENT:   Head: Normocephalic and atraumatic.   Right Ear: External ear normal.   Left Ear: External ear normal.   Nose: Nose normal.   Mouth/Throat: Oropharynx is clear and moist.   Eyes: EOM are normal. Pupils are equal, round, and reactive to light. Right eye exhibits no discharge. Left eye exhibits no discharge.   Neck: Normal range of motion. Neck supple. No JVD present. No tracheal deviation present. No thyromegaly present.   Cardiovascular: Normal rate,  regular rhythm, normal heart sounds and intact distal pulses.  Exam reveals no gallop.    No murmur heard.  Pulmonary/Chest: Effort normal and breath sounds normal. No stridor. No respiratory distress.   Much clearer lungs and only wheeze on forced expiration   Abdominal: Soft. Bowel sounds are normal. She exhibits no distension. There is no tenderness.   Musculoskeletal: Normal range of motion. She exhibits no edema.   Lymphadenopathy:     She has no cervical adenopathy.   Neurological: She is alert and oriented to person, place, and time. She has normal reflexes.   Tender over frontal   Skin: Skin is warm and dry. She is not diaphoretic.   Psychiatric: She has a normal mood and affect. Her behavior is normal. Judgment and thought content normal.   Nursing note and vitals reviewed.    Left knee tender medially neg mcmurray neg efffusion    Pale  Diffusely exp wheezing but moving air better  Cardio RRR no mrg  abdo soft nontender  Assessment:      1.Left knee probable torn medial meniscus: knee xray and mri ortho referra;  2.migraines : prednisone taper, increase paxil 60 mg per day stay on topamax, may need migraine specialist botox injections  3.  LLL pneumonia: recheck chest  ct and see if the prednisone taper helps        1. Recent LLL pneumonia: prevnar today  2. Asthma: stay on inhaler especially since husband smokes in the house  3. Migraines down to 4 or fewer per momth will continue same  4. Pallor not had fasting labs in 18 mos due for   Plan:      As above

## 2014-09-16 MED ORDER — ATORVASTATIN CALCIUM 40 MG PO TABS
40 MG | ORAL_TABLET | ORAL | Status: DC
Start: 2014-09-16 — End: 2016-06-10

## 2014-09-16 MED ORDER — PAROXETINE HCL 20 MG PO TABS
20 MG | ORAL_TABLET | ORAL | Status: DC
Start: 2014-09-16 — End: 2015-11-14

## 2014-09-16 MED ORDER — OXYBUTYNIN CHLORIDE ER 10 MG PO TB24
10 MG | ORAL_TABLET | Freq: Every day | ORAL | Status: DC
Start: 2014-09-16 — End: 2016-02-01

## 2014-09-16 MED ORDER — TOPIRAMATE 100 MG PO TABS
100 MG | ORAL_TABLET | Freq: Two times a day (BID) | ORAL | Status: DC
Start: 2014-09-16 — End: 2015-11-14

## 2014-09-16 MED ORDER — DICLOFENAC POTASSIUM(MIGRAINE) 50 MG PO PACK
50 | ORAL | Status: DC | PRN
Start: 2014-09-16 — End: 2015-04-08

## 2014-09-16 MED ORDER — ALBUTEROL SULFATE HFA 108 (90 BASE) MCG/ACT IN AERS
108 (90 Base) MCG/ACT | Freq: Four times a day (QID) | RESPIRATORY_TRACT | Status: DC | PRN
Start: 2014-09-16 — End: 2015-04-08

## 2014-09-16 NOTE — Telephone Encounter (Signed)
Spoke to pt she says she wants rx's that were sent through mail service canceled and rx's to be called in to the [facility] pharm instead. DONE

## 2014-09-16 NOTE — Telephone Encounter (Signed)
Error

## 2014-09-16 NOTE — Telephone Encounter (Signed)
Pt asking to speak with Joslyn regarding her medications. She said there was a mix up but did not provide additional details.    [phone]

## 2014-09-16 NOTE — Telephone Encounter (Signed)
Pt called in upset about her labs. She said she spoke with someone last week regarding her labs and that she was told they were normal. She said today she received a letter saying we could not reach her and that there was a script for her at the front desk. She is upset because last week she was told they were normal and she said she spoke with Joslyn today regarding medications and this was not mentioned to her. I put pt on hold to try to figure out what was going on and pt hung up.

## 2014-09-17 NOTE — Telephone Encounter (Signed)
Called and spoke with patient.  Reviewed all lab results with patient and she will come in next week to get a b12 injection.  Copy of results at front with instruction for patient to pick up.

## 2014-09-22 ENCOUNTER — Encounter: Admit: 2014-09-22 | Discharge: 2014-09-22 | Payer: MEDICARE | Primary: Internal Medicine

## 2014-09-22 DIAGNOSIS — D649 Anemia, unspecified: Secondary | ICD-10-CM

## 2014-09-22 MED ORDER — CYANOCOBALAMIN 1000 MCG/ML IJ SOLN
1000 MCG/ML | Freq: Once | INTRAMUSCULAR | Status: AC
Start: 2014-09-22 — End: 2014-09-22
  Administered 2014-09-22: 15:00:00 1000 ug via INTRAMUSCULAR

## 2015-03-25 ENCOUNTER — Encounter

## 2015-03-25 MED ORDER — ELETRIPTAN HYDROBROMIDE 40 MG PO TABS
40 MG | ORAL_TABLET | Freq: Once | ORAL | 0 refills | Status: AC | PRN
Start: 2015-03-25 — End: 2015-03-25

## 2015-03-25 NOTE — Telephone Encounter (Signed)
Due for six month appt  Needs to schedule

## 2015-03-25 NOTE — Telephone Encounter (Signed)
eletriptan (RELPAX) 40 MG tablet Take 1 tablet by mouth once as needed for up to 1 dose. may repeat in 2 hours if necessary     [facility] [id number] - [place], [place]      LOV 09/07/14

## 2015-04-08 ENCOUNTER — Ambulatory Visit: Admit: 2015-04-08 | Discharge: 2015-04-08 | Payer: MEDICARE | Attending: Internal Medicine | Primary: Internal Medicine

## 2015-04-08 ENCOUNTER — Encounter

## 2015-04-08 DIAGNOSIS — R110 Nausea: Secondary | ICD-10-CM

## 2015-04-08 MED ORDER — CYANOCOBALAMIN 1000 MCG PO TABS
1000 MCG | ORAL_TABLET | Freq: Every day | ORAL | 3 refills | Status: DC
Start: 2015-04-08 — End: 2016-06-10

## 2015-04-08 NOTE — Progress Notes (Signed)
Subjective:      Patient ID: Linda [name] is a 69 y.o. female.    HPI  REPORTS HAVING stomach upset for 3 weeks.  Stopped today.  Has been having a lot of nausea .  When bending forward gets nauseated and headachy Denies any belly pain or wght loss.  Weight is down 10 lbs in 9 months.  Denies chest pains or gerd either. Main meds she takes for her migraines are relpax and cambia.  Migraines usually about 4 a month  Review of Systems    Objective:   Physical Exam   Constitutional: She is oriented to person, place, and time. She appears well-developed and well-nourished. No distress.   HENT:   Head: Normocephalic and atraumatic.   Right Ear: External ear normal.   Left Ear: External ear normal.   Nose: Nose normal.   Mouth/Throat: Oropharynx is clear and moist.   Eyes: EOM are normal. Pupils are equal, round, and reactive to light. Right eye exhibits no discharge. Left eye exhibits no discharge.   Neck: Normal range of motion. Neck supple. No JVD present. No tracheal deviation present. No thyromegaly present.   Cardiovascular: Normal rate, regular rhythm and normal heart sounds.  Exam reveals no gallop.    No murmur heard.  Pulmonary/Chest: Effort normal and breath sounds normal. No stridor. No respiratory distress.   Abdominal: Soft. Bowel sounds are normal. She exhibits no distension. There is no tenderness.   Epigastric tenderness   Musculoskeletal: Normal range of motion. She exhibits no edema.   Lymphadenopathy:     She has no cervical adenopathy.   Neurological: She is alert and oriented to person, place, and time. She has normal reflexes.   Skin: Skin is warm and dry. She is not diaphoretic.   Psychiatric: She has a normal mood and affect. Her behavior is normal. Judgment and thought content normal.   Nursing note and vitals reviewed.      Assessment:      1. Epigastric tenderness : could be PUD from Nsaid use  Plan : start PPI, check labs ,refer back to Dr Awanda Mink       Plan:      As above

## 2015-04-09 LAB — CBC WITH AUTO DIFFERENTIAL
Basophils %: 0.3 %
Basophils Absolute: 0 10*3/uL (ref 0.0–0.2)
Eosinophils %: 2.6 %
Eosinophils Absolute: 0.2 10*3/uL (ref 0.0–0.6)
Hematocrit: 38.4 % (ref 36.0–48.0)
Hemoglobin: 12.8 g/dL (ref 12.0–16.0)
Lymphocytes %: 33.6 %
Lymphocytes Absolute: 2.3 10*3/uL (ref 1.0–5.1)
MCH: 31.5 pg (ref 26.0–34.0)
MCHC: 33.2 g/dL (ref 31.0–36.0)
MCV: 94.9 fL (ref 80.0–100.0)
MPV: 8 fL (ref 5.0–10.5)
Monocytes %: 4.9 %
Monocytes Absolute: 0.3 10*3/uL (ref 0.0–1.3)
Neutrophils %: 58.6 %
Neutrophils Absolute: 4.1 10*3/uL (ref 1.7–7.7)
Platelets: 219 10*3/uL (ref 135–450)
RBC: 4.05 M/uL (ref 4.00–5.20)
RDW: 16.1 % — ABNORMAL HIGH (ref 12.4–15.4)
WBC: 6.9 10*3/uL (ref 4.0–11.0)

## 2015-04-09 LAB — LIPID PANEL
Cholesterol, Total: 288 mg/dL — ABNORMAL HIGH (ref 0–199)
HDL: 38 mg/dL — ABNORMAL LOW (ref 40–60)
LDL Calculated: 210 mg/dL — ABNORMAL HIGH (ref <100)
Triglycerides: 201 mg/dL — ABNORMAL HIGH (ref 0–150)
VLDL Cholesterol Calculated: 40 mg/dL

## 2015-04-09 LAB — COMPREHENSIVE METABOLIC PANEL
ALT: 17 U/L (ref 10–40)
AST: 21 U/L (ref 15–37)
Albumin/Globulin Ratio: 1.5 (ref 1.1–2.2)
Albumin: 4.4 g/dL (ref 3.4–5.0)
Alkaline Phosphatase: 134 U/L — ABNORMAL HIGH (ref 40–129)
Anion Gap: 18 — ABNORMAL HIGH (ref 3–16)
BUN: 7 mg/dL (ref 7–20)
CO2: 24 mmol/L (ref 21–32)
Calcium: 9.8 mg/dL (ref 8.3–10.6)
Chloride: 100 mmol/L (ref 99–110)
Creatinine: 0.7 mg/dL (ref 0.6–1.2)
GFR African American: >60 (ref >60)
GFR Non-African American: >60 (ref >60)
Globulin: 3 g/dL
Glucose: 83 mg/dL (ref 70–99)
Potassium: 4 mmol/L (ref 3.5–5.1)
Sodium: 142 mmol/L (ref 136–145)
Total Bilirubin: 0.5 mg/dL (ref 0.0–1.0)
Total Protein: 7.4 g/dL (ref 6.4–8.2)

## 2015-04-09 LAB — VITAMIN B12: Vitamin B-12: 329 pg/mL (ref 211–911)

## 2015-04-09 LAB — TSH: TSH: 2.73 u[IU]/mL (ref 0.27–4.20)

## 2015-04-09 LAB — VITAMIN D 25 HYDROXY: Vit D, 25-Hydroxy: 36.5 ng/mL (ref >=30)

## 2015-04-15 ENCOUNTER — Encounter

## 2015-04-15 NOTE — Telephone Encounter (Signed)
Shelly with [facility] lab is calling to say the add on test you requested can not be done do to no specimen...Marland KitchenMarland Kitchen

## 2015-04-15 NOTE — Telephone Encounter (Signed)
Informed Dr. Tamala Julian as he is helping cover while Dr. Garen Grams is out of the office.

## 2015-04-21 ENCOUNTER — Encounter

## 2015-07-26 NOTE — Telephone Encounter (Signed)
Pt reminded of need for repeat lipids and GGT

## 2015-08-09 MED ORDER — ATORVASTATIN CALCIUM 40 MG PO TABS
40 MG | ORAL_TABLET | ORAL | 1 refills | Status: DC
Start: 2015-08-09 — End: 2016-06-10

## 2015-08-09 NOTE — Telephone Encounter (Signed)
Due for fu visit with dr Garen Grams please help him/her schedule before med runs out  March

## 2015-11-14 MED ORDER — TOPIRAMATE 100 MG PO TABS
100 MG | ORAL_TABLET | ORAL | 0 refills | Status: DC
Start: 2015-11-14 — End: 2016-07-17

## 2015-11-15 MED ORDER — PAROXETINE HCL 20 MG PO TABS
20 MG | ORAL_TABLET | ORAL | 0 refills | Status: DC
Start: 2015-11-15 — End: 2016-07-31

## 2015-11-17 ENCOUNTER — Encounter: Attending: Internal Medicine | Primary: Internal Medicine

## 2015-11-17 ENCOUNTER — Encounter: Admit: 2015-11-17 | Primary: Internal Medicine

## 2015-11-17 ENCOUNTER — Ambulatory Visit: Admit: 2015-11-17 | Discharge: 2015-11-17 | Payer: MEDICARE | Attending: Internal Medicine | Primary: Internal Medicine

## 2015-11-17 ENCOUNTER — Inpatient Hospital Stay
Admit: 2015-11-17 | Discharge: 2015-11-17 | Disposition: A | Discharge status: Home / Self Care | Attending: Emergency Medicine

## 2015-11-17 DIAGNOSIS — G43719 Chronic migraine without aura, intractable, without status migrainosus: Secondary | ICD-10-CM

## 2015-11-17 DIAGNOSIS — R1011 Right upper quadrant pain: Secondary | ICD-10-CM

## 2015-11-17 LAB — CBC WITH AUTO DIFFERENTIAL
Basophils %: 0.6 %
Basophils Absolute: 0 10*3/uL (ref 0.0–0.2)
Eosinophils %: 1.8 %
Eosinophils Absolute: 0.1 10*3/uL (ref 0.0–0.6)
Hematocrit: 38.2 % (ref 36.0–48.0)
Hemoglobin: 12.4 g/dL (ref 12.0–16.0)
Lymphocytes %: 24 %
Lymphocytes Absolute: 1.6 10*3/uL (ref 1.0–5.1)
MCH: 29.1 pg (ref 26.0–34.0)
MCHC: 32.4 g/dL (ref 31.0–36.0)
MCV: 89.8 fL (ref 80.0–100.0)
MPV: 7.8 fL (ref 5.0–10.5)
Monocytes %: 5.9 %
Monocytes Absolute: 0.4 10*3/uL (ref 0.0–1.3)
Neutrophils %: 67.7 %
Neutrophils Absolute: 4.4 10*3/uL (ref 1.7–7.7)
Platelets: 220 10*3/uL (ref 135–450)
RBC: 4.25 M/uL (ref 4.00–5.20)
RDW: 14.5 % (ref 12.4–15.4)
WBC: 6.5 10*3/uL (ref 4.0–11.0)

## 2015-11-17 LAB — URINALYSIS WITH REFLEX TO CULTURE
Bilirubin Urine: NEGATIVE
Glucose, Ur: NEGATIVE mg/dL
Ketones, Urine: 15 mg/dL — AB
Leukocyte Esterase, Urine: NEGATIVE
Nitrite, Urine: NEGATIVE
Protein, UA: NEGATIVE mg/dL
Specific Gravity, UA: 1.023 (ref 1.005–1.030)
Urobilinogen, Urine: 0.2 E.U./dL (ref <2.0)
pH, UA: 5.5 (ref 5.0–8.0)

## 2015-11-17 LAB — COMPREHENSIVE METABOLIC PANEL
ALT: 29 U/L (ref 10–40)
AST: 31 U/L (ref 15–37)
Albumin/Globulin Ratio: 1.4 (ref 1.1–2.2)
Albumin: 4.5 g/dL (ref 3.4–5.0)
Alkaline Phosphatase: 114 U/L (ref 40–129)
Anion Gap: 14 (ref 3–16)
BUN: 10 mg/dL (ref 7–20)
CO2: 22 mmol/L (ref 21–32)
Calcium: 9.5 mg/dL (ref 8.3–10.6)
Chloride: 103 mmol/L (ref 99–110)
Creatinine: 0.6 mg/dL (ref 0.6–1.2)
GFR African American: >60 (ref >60)
GFR Non-African American: >60 (ref >60)
Globulin: 3.3 g/dL
Glucose: 102 mg/dL — ABNORMAL HIGH (ref 70–99)
Potassium: 4.1 mmol/L (ref 3.5–5.1)
Sodium: 139 mmol/L (ref 136–145)
Total Bilirubin: 0.3 mg/dL (ref 0.0–1.0)
Total Protein: 7.8 g/dL (ref 6.4–8.2)

## 2015-11-17 LAB — MICROSCOPIC URINALYSIS
Epithelial Cells, UA: 1 /HPF (ref 0–5)
Hyaline Casts, UA: 0 /LPF (ref 0–8)
RBC, UA: 13 /HPF — ABNORMAL HIGH (ref 0–4)
WBC, UA: 2 /HPF (ref 0–5)

## 2015-11-17 LAB — LIPASE: Lipase: 28 U/L (ref 13.0–60.0)

## 2015-11-17 MED ORDER — SODIUM CHLORIDE 0.9 % IV BOLUS
0.9 % | Freq: Once | INTRAVENOUS | Status: AC
Start: 2015-11-17 — End: 2015-11-17
  Administered 2015-11-17: 20:00:00 1000 mL via INTRAVENOUS

## 2015-11-17 MED ORDER — DIPHENHYDRAMINE HCL 50 MG/ML IJ SOLN
50 MG/ML | Freq: Once | INTRAMUSCULAR | Status: AC
Start: 2015-11-17 — End: 2015-11-17
  Administered 2015-11-17: 20:00:00 25 mg via INTRAVENOUS

## 2015-11-17 MED ORDER — METOCLOPRAMIDE HCL 5 MG/ML IJ SOLN
5 MG/ML | Freq: Once | INTRAMUSCULAR | Status: AC
Start: 2015-11-17 — End: 2015-11-17
  Administered 2015-11-17: 20:00:00 10 mg via INTRAVENOUS

## 2015-11-17 MED ORDER — KETOROLAC TROMETHAMINE 30 MG/ML IJ SOLN
30 MG/ML | Freq: Once | INTRAMUSCULAR | Status: AC
Start: 2015-11-17 — End: 2015-11-17
  Administered 2015-11-17: 20:00:00 15 mg via INTRAVENOUS

## 2015-11-17 MED FILL — METOCLOPRAMIDE HCL 5 MG/ML IJ SOLN: 5 MG/ML | INTRAMUSCULAR | Qty: 2

## 2015-11-17 MED FILL — SODIUM CHLORIDE 0.9 % IV SOLN: 0.9 % | INTRAVENOUS | Qty: 1000

## 2015-11-17 MED FILL — DIPHENHYDRAMINE HCL 50 MG/ML IJ SOLN: 50 MG/ML | INTRAMUSCULAR | Qty: 1

## 2015-11-17 MED FILL — KETOROLAC TROMETHAMINE 30 MG/ML IJ SOLN: 30 MG/ML | INTRAMUSCULAR | Qty: 1

## 2015-11-17 NOTE — ED Provider Notes (Signed)
[facility] EMERGENCY DEPT  [place] [id number]  Dept: [phone]  Loc: [phone]  eMERGENCY dEPARTMENT eNCOUnter      Pt Name: Linda [name]  MRN: [id number]  [place] 01-10-1946  Date of evaluation: 11/17/2015  Provider: Nile Dear, NP    CHIEF COMPLAINT       Chief Complaint   Patient presents with   ??? Nausea     x1 week   ??? Headache     x1 week   ??? Other     sent by PCP for concern gallbladder       CRITICAL CARE TIME       HISTORY OF PRESENT ILLNESS  (Location/Symptom, Timing/Onset, Context/Setting, Quality, Duration, Modifying Factors, Severity.)   Linda [name] is a 70 y.o. female who presents to the emergency department complaining of 10 days of a bifrontal headache, which is similar to what she normally has. In fact she states typically her migraine headaches are very severe, this is more on the lighter side. She denies any visual disturbance light sensitivity. She does report nausea. She is also complaining of the nausea and the headaches that have been coinciding together for about 10 days. She's been taking her migraine medication and following up with her primary care physician, Dr. Isabell Jarvis. Today she called again regarding the generally not feeling well, headaches and nausea. She was seen in the office and the physician in the office felt that she had some abdominal tenderness therefore directed her to the emergency department for further evaluation.  She denies fever or chills. She denies neck pain. She denies vomiting or diarrhea. Last bowel movement this morning. She denies any belching but does report abdominal bloating for about 10 days as well. She states when she had the abdominal exam in the office she recognized that she was tender in the upper abdomen.  Patient has had headaches and migraine type of scenario is, as she reports since the age of 13.       Nursing Notes were reviewed and agreed with or any disagreements were addressed  in the HPI.    REVIEW OF SYSTEMS    (2-9 systems for level 4, 10 or more for level 5)     Review of Systems   Constitutional: Negative.    HENT: Negative for congestion, rhinorrhea, tinnitus and voice change.    Respiratory: Negative.    Cardiovascular: Negative.    Gastrointestinal: Positive for nausea. Negative for abdominal pain, diarrhea and vomiting.   Musculoskeletal: Negative.    Skin: Negative.    Neurological: Positive for dizziness and headaches. Negative for tremors, syncope, facial asymmetry and weakness.       Except as noted above the remainder of the review of systems was reviewed and negative.       PAST MEDICAL HISTORY         Diagnosis Date   ??? Bisphosphonate-associated osteonecrosis of the jaw [facility])    ??? Celiac artery atherosclerosis 2015    60% proximal stenosis   ??? Colon polyps     adenomatous    ??? Dyspepsia     nonulcerative   ??? High cholesterol    ??? History of depression    ??? Migraine     recurrent   ??? Osteoarthritis of lumbar spine     [place]   ??? Postmenopausal    ??? Thoracic compression fracture ([place])     T10 from a fall  SURGICAL HISTORY           Procedure Laterality Date   ??? ARTERY BIOPSY Right 12-24-12    Temporal Artery biopsy right    ??? COLONOSCOPY  [id number]    dr Awanda Mink   3 years   ??? KYPHOSIS SURGERY      t10   ??? SHOULDER SURGERY      left humerus fx 3 places   ??? UPPER GASTROINTESTINAL ENDOSCOPY  08/26/2014    dr Shanon Brow hess:negative       CURRENT MEDICATIONS       Discharge Medication List as of 11/17/2015  5:36 PM      CONTINUE these medications which have NOT CHANGED    Details   PARoxetine (PAXIL) 20 MG tablet TAKE 2 1/2 TABLETS BY MOUTH DAILY, Disp-150 tablet, R-0Normal      topiramate (TOPAMAX) 100 MG tablet TAKE 1 TABLET BY MOUTH TWICE DAILY, Disp-180 tablet, R-0Normal      !! atorvastatin (LIPITOR) 40 MG tablet TAKE 1 TABLET BY MOUTH DAILY, Disp-30 tablet, R-1Normal      cyanocobalamin ([employer] VITAMIN B12) 1000 MCG tablet Take 1 tablet by mouth daily, Disp-30 tablet, R-3      !!  atorvastatin (LIPITOR) 40 MG tablet TAKE ONE TABLET BY MOUTH DAILY, Disp-90 tablet, R-3      oxybutynin (DITROPAN XL) 10 MG CR tablet Take 1 tablet by mouth daily For bladder, Disp-90 tablet, R-3      Coenzyme Q10 (CO Q 10) 100 MG CAPS Take 200 mg by mouth.      Ascorbic Acid (VITAMIN C) 250 MG tablet Take 1,000 mg by mouth daily.      Biotin 5000 MCG CAPS Take  by mouth.      Omega-3 Fatty Acids (FISH OIL) 500 MG CAPS Take 500 mg by mouth 2 times daily.      Calcium Carbonate-Vitamin D (CALTRATE 600+D) 600-400 MG-UNIT CHEW Take 1 tablet by mouth 2 times daily., Disp-60 tablet, R-11       !! - Potential duplicate medications found. Please discuss with provider.          ALLERGIES     Actonel [risedronate sodium]    FAMILY HISTORY           Problem Relation Age of Onset   ??? Migraines Other    ??? Diabetes Other    ??? Breast Cancer Sister    ??? Cancer Mother    ??? Cancer Father      Family Status   Relation Status   ??? Other Alive   ??? Other Alive   ??? Sister    ??? Mother    ??? Father         SOCIAL HISTORY      reports that she quit smoking about 18 years ago. She has never used smokeless tobacco. She reports that she drinks alcohol. She reports that she does not use illicit drugs.    PHYSICAL EXAM    (up to 7 for level 4, 8 or more for level 5)   ED Triage Vitals   Enc Vitals Group      BP 11/17/15 1505 146/86      Pulse 11/17/15 1505 71      Resp 11/17/15 1505 12      Temp 11/17/15 1505 97.9 ??F (36.6 ??C)      Temp Source 11/17/15 1505 Oral      SpO2 11/17/15 1505 97 %      Weight 11/17/15 1505  134 lb 5 oz (60.9 kg)      Height 11/17/15 1505 5\' 4"  (1.626 m)      Head Cir --       Peak Flow --       Pain Score --       Pain Loc --       Pain Edu? --       Excl. in [place]? --        Physical Exam   Constitutional: She is oriented to person, place, and time. Vital signs are normal. She appears well-developed and well-nourished. She is cooperative.  Non-toxic appearance. She does not have a sickly appearance. She does not appear ill.  No distress.   HENT:   Head: Normocephalic and atraumatic.   Nose: Nose normal.   Mouth/Throat: Oropharynx is clear and moist. No oropharyngeal exudate.   Eyes: EOM are normal. Pupils are equal, round, and reactive to light. No scleral icterus.   Neck: Normal range of motion. Neck supple.   Cardiovascular: Normal rate, regular rhythm, normal heart sounds and intact distal pulses.  Exam reveals no gallop and no friction rub.    No murmur heard.  Pulmonary/Chest: Effort normal and breath sounds normal.   Abdominal: Soft. Bowel sounds are normal. There is no hepatosplenomegaly. There is no tenderness. There is no rebound, no guarding and no CVA tenderness. No hernia. Hernia confirmed negative in the ventral area, confirmed negative in the right inguinal area and confirmed negative in the left inguinal area.   Very loud audible and  palpable bowel sounds throughout.No Palpable masses, distention or defects.   Neurological: She is alert and oriented to person, place, and time. She has normal strength. No cranial nerve deficit or sensory deficit. She displays a negative Romberg sign. Coordination and gait normal. GCS eye subscore is 4. GCS verbal subscore is 5. GCS motor subscore is 6.   INR is 2 through 12 grossly intact no focal motor or sensory deficit. Speech is clear and articulate. No evidence of ataxia or peripheral deficits.   Skin: Skin is warm and dry. She is not diaphoretic.   Nursing note and vitals reviewed.        DIAGNOSTIC RESULTS     EKG: All EKG's are interpreted by the Emergency Department Physician who either signs or Co-signs this chart in the absence of a cardiologist.    RADIOLOGY:   Non-plain film images such as CT, Ultrasound and MRI are read by the radiologist. Plain radiographic images are visualized and preliminarily interpreted by the emergency physician with the below findings:    Interpretation per the Radiologist below, if available at the time of this note:    CT Head WO Contrast   Final  Result   No acute intracranial abnormality.         US ABDOMINAL LIMITED   Final Result   Gallbladder contracted and not well studied.  No definite shadowing   gallstones detected.      Hepatic steatosis.               LABS:  Labs Reviewed   COMPREHENSIVE METABOLIC PANEL - Abnormal; Notable for the following:        Result Value    Glucose 102 (*)     All other components within normal limits   URINE RT REFLEX TO CULTURE - Abnormal; Notable for the following:     Clarity, UA CLOUDY (*)     Ketones, Urine 15 (*)  Blood, Urine MODERATE (*)     All other components within normal limits   MICROSCOPIC URINALYSIS - Abnormal; Notable for the following:     RBC, UA 13 (*)     All other components within normal limits   CBC WITH AUTO DIFFERENTIAL   LIPASE       All other labs were within normal range or not returned as of this dictation.    EMERGENCY DEPARTMENT COURSE and DIFFERENTIAL DIAGNOSIS/MDM:   Vitals:    Vitals:    11/17/15 1505 11/17/15 1646 11/17/15 1701 11/17/15 1749   BP: (!) 146/86 118/67 (!) 120/59 113/69   Pulse: 71  85 86   Resp: 12  16 16    Temp: 97.9 ??F (36.6 ??C)      TempSrc: Oral      SpO2: 97% 98% 100% 100%   Weight: 134 lb 5 oz (60.9 kg)      Height: 5\' 4"  (1.626 m)        Medications   diphenhydrAMINE (BENADRYL) injection 25 mg (25 mg Intravenous Given 11/17/15 1625)   metoclopramide (REGLAN) injection 10 mg (10 mg Intravenous Given 11/17/15 1629)   ketorolac (TORADOL) injection 15 mg (15 mg Intravenous Given 11/17/15 1629)   0.9 % sodium chloride bolus (0 mLs Intravenous Stopped 11/17/15 1750)       MDM  Seen and evaluated per myself and Dr. Quin Hoop. She's presenting to the emergency department for nausea and headache is been present for 10 days. She was seen at PCP office and there was concerns for her nausea and hyperactive bowel sounds for gallbladder disease although she is not having any pain. History of present illness is described above. Physical exam is negative for any evidence of  encephalopathy or neurological deficit. No evidence of meningismus. No evidence of papilledema. Chest is clear and abdomen was soft although she had very hyperactive loud audible and palpable bowel sounds. There is no defects. Negative Murphy sign. No evidence of peritonitis.  CBC chemistry profile are unremarkable. I did perform a CT of her head and there was no evidence of ischemia, acutely. 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 There is no evidence of masses or bleed. Her MRI from several years ago was also negative for any masses or acute findings. I don't believe that an LP is indicated as her headache is been present for 10 days and she has a chronic known history of migraines since the age of 87. The LP risk outweighs the benefit.  Gallbladder ultrasound and chemistry profiles are negative for any evidence of cholangitis, cholecystitis or pancreatitis. The gallbladder was contracted but there was no evidence of enlargement appear cholestatic fluid.  Patient's nausea subsided here in the emergency department but she still complained of her generalized headache that she's had for 10 days. At this point in time she's had the headache cocktail without much relief. There is no evidence of deficit. This is a chronic issue. We do not believe at this time that there are any further diagnostic studies for the emergency department and she can be managed and followed on an outpatient basis. Patient's discharged home in good condition. No evidence of bowel obstruction or an acute abdomen. No evidence of an acute neurological condition. Plan of care and diagnostic study results were discussed with the patient and she was in agreement discharged home in good condition.  CONSULTS:  None    PROCEDURES:  Procedures    FINAL IMPRESSION      1.  Intractable chronic migraine without aura and without status migrainosus          DISPOSITION/PLAN   DISPOSITION     PATIENT REFERRED TO:  Angelica Pou, MD  [place] [address]  [phone]    Schedule an appointment as soon as possible for a visit in 3 days      [facility]  [address]  [place] [address]  [phone]    As needed, If symptoms worsen      DISCHARGE MEDICATIONS:  Discharge Medication List as of 11/17/2015  5:36 PM          (Please note that portions of this note were completed with a voice recognition program.  Efforts were made to edit the dictations but occasionally words are mis-transcribed.)    Gary Gabrielsen Daryel November, NP         Darrick Meigs, NP  11/18/15 1436

## 2015-11-17 NOTE — ED Provider Notes (Signed)
I independently evaluated and obtained a history and physical on [place] A [place].    All diagnostic, treatment, and disposition decisions were made by myself in conjunction the advanced practice provider.    For further details of this patient's emergency department encounter, please see the advanced practice provider's documentation.    History: 70 year old female who presents today with headache that is present ??10 days. She does have a history of headaches. This is similar. She states that this is her typical migraine and actually less severe than they have been in the past. She has associated nausea without photophobia. No phonophobia. Patient also called and spoke with her primary care physician today and mentioned some abdominal tenderness as well as abdominal bloating and belching for the past 10 days. She was directed to have her gallbladder looked at in the emergency department by her primary care physician.    Physician Exam: Heart regular rate and rhythm. Lungs clear to auscultation bilaterally. Alert and oriented ??4. Pupils 3 mm and equally reactive. No focal neurologic deficits. Abdomen soft nontender nondistended.            Wilfred Lacy, MD  11/18/15 [phone]

## 2015-11-17 NOTE — ED Triage Notes (Signed)
Pt to ED, sent by Dr Marlene Bast for nausea and mild headache for the past week. Pt states doctor pushed around on abd and she was having pain so was sent in to be evaluated. Denies diarrhea and vomiting.

## 2015-11-17 NOTE — ED Notes (Signed)
Pt taken to ultra sound     Rojelio Brenner, EMT-P  11/17/15 1525

## 2015-11-17 NOTE — Progress Notes (Signed)
Subjective:      Patient ID: Linda [name] is a 69 y.o. female.    HPI  Here for evaluation of abd pain and nausea over the last 2 days.  She has a mild headache, but has persistent nausea.  No fevers noted.  It started on it's own, not related to eating.  No diarrhea.  No emesis, just very nauseated  No cough or SOB  Drinks minimal alcohol    Review of Systems    Objective:   Physical Exam   Abdominal: Soft. Bowel sounds are normal. She exhibits no distension. There is tenderness (RUQ/Epigastric tenderness worrisome for cholecystitis).   Vitals reviewed.      Assessment:      RUQ pain-  Very tender-  Refer to [employer] for evalaution      Plan:      As above

## 2016-02-01 MED ORDER — OXYBUTYNIN CHLORIDE ER 10 MG PO TB24
10 MG | ORAL_TABLET | ORAL | 0 refills | Status: DC
Start: 2016-02-01 — End: 2016-07-10

## 2016-06-10 ENCOUNTER — Inpatient Hospital Stay
Admit: 2016-06-10 | Discharge: 2016-06-10 | Disposition: A | Discharge status: Home / Self Care | Attending: Emergency Medicine

## 2016-06-10 DIAGNOSIS — G43909 Migraine, unspecified, not intractable, without status migrainosus: Secondary | ICD-10-CM

## 2016-06-10 LAB — MICROSCOPIC URINALYSIS

## 2016-06-10 LAB — URINALYSIS WITH REFLEX TO CULTURE
Bilirubin Urine: NEGATIVE
Glucose, Ur: NEGATIVE mg/dL
Ketones, Urine: NEGATIVE mg/dL
Leukocyte Esterase, Urine: NEGATIVE
Nitrite, Urine: NEGATIVE
Protein, UA: NEGATIVE mg/dL
Specific Gravity, UA: <=1.005 (ref 1.005–1.030)
Urobilinogen, Urine: 0.2 E.U./dL (ref <2.0)
pH, UA: 6.5 (ref 5.0–8.0)

## 2016-06-10 MED ORDER — DIPHENHYDRAMINE HCL 50 MG/ML IJ SOLN
50 MG/ML | Freq: Once | INTRAMUSCULAR | Status: AC
Start: 2016-06-10 — End: 2016-06-10
  Administered 2016-06-10: 19:00:00 50 mg via INTRAVENOUS

## 2016-06-10 MED ORDER — DEXAMETHASONE SODIUM PHOSPHATE 4 MG/ML IJ SOLN
4 MG/ML | Freq: Once | INTRAMUSCULAR | Status: AC
Start: 2016-06-10 — End: 2016-06-10
  Administered 2016-06-10: 20:00:00 6 mg via INTRAVENOUS

## 2016-06-10 MED ORDER — SODIUM CHLORIDE 0.9 % IV BOLUS
0.9 % | Freq: Once | INTRAVENOUS | Status: AC
Start: 2016-06-10 — End: 2016-06-10
  Administered 2016-06-10: 19:00:00 1000 mL via INTRAVENOUS

## 2016-06-10 MED ORDER — METOCLOPRAMIDE HCL 5 MG/ML IJ SOLN
5 MG/ML | Freq: Once | INTRAMUSCULAR | Status: AC
Start: 2016-06-10 — End: 2016-06-10
  Administered 2016-06-10: 19:00:00 10 mg via INTRAVENOUS

## 2016-06-10 MED ORDER — KETOROLAC TROMETHAMINE 30 MG/ML IJ SOLN
30 MG/ML | Freq: Once | INTRAMUSCULAR | Status: AC
Start: 2016-06-10 — End: 2016-06-10
  Administered 2016-06-10: 19:00:00 15 mg via INTRAVENOUS

## 2016-06-10 MED ORDER — DIAZEPAM 5 MG/ML IJ SOLN
5 MG/ML | Freq: Once | INTRAMUSCULAR | Status: DC
Start: 2016-06-10 — End: 2016-06-10

## 2016-06-10 MED FILL — SODIUM CHLORIDE 0.9 % IV SOLN: 0.9 % | INTRAVENOUS | Qty: 1000

## 2016-06-10 MED FILL — KETOROLAC TROMETHAMINE 30 MG/ML IJ SOLN: 30 MG/ML | INTRAMUSCULAR | Qty: 1

## 2016-06-10 MED FILL — DEXAMETHASONE SODIUM PHOSPHATE 4 MG/ML IJ SOLN: 4 MG/ML | INTRAMUSCULAR | Qty: 2

## 2016-06-10 MED FILL — METOCLOPRAMIDE HCL 5 MG/ML IJ SOLN: 5 MG/ML | INTRAMUSCULAR | Qty: 2

## 2016-06-10 MED FILL — DIPHENHYDRAMINE HCL 50 MG/ML IJ SOLN: 50 MG/ML | INTRAMUSCULAR | Qty: 1

## 2016-06-10 NOTE — ED Triage Notes (Signed)
Pt c/o migraine, vomited yesterday.  Pt states that symptoms started a weeks ago with stomachache, headache, now migraine.

## 2016-06-10 NOTE — ED Provider Notes (Signed)
[facility]  eMERGENCY dEPARTMENT eNCOUnter        Pt Name: Linda [name]  MRN: [id number]  [place] 08/01/1946  Date of evaluation: 06/10/2016  Provider: Wilfred Lacy, MD  PCP: Angelica Pou, MD  ED Attending: Wilfred Lacy, MD    CHIEF COMPLAINT       Chief Complaint   Patient presents with   ??? Migraine     Pt c/o migraine, vomited yesterday.  Pt states that symptoms started a weeks ago with stomachache, headache, now migraine.       HISTORY OF PRESENT ILLNESS   (Location/Symptom, Timing/Onset, Context/Setting, Quality, Duration, Modifying Factors, Severity)  Note limiting factors.     Linda [name] is a 69 y.o. female presents today with complaint of migraine headache. She notes that this is similar to her previous migraines. Has been present for the past 7 days. She has attempted her medication which she was given from her migraine Dr. Bennie Dallas longer sees. She notes it's a bifrontal headache that goes in the retro-orbital region. No neck pain. No neck stiffness. He says he with nausea vomiting as well as photophobia and phonophobia. She notes that she has been attempting to treat it for the past 7 days but has been able to break it. No neck stiffness. No chest pain or shortness of breath. No abdominal pain. No back pain. Patient has no numbness weakness or tingling. She has no other complaints.    Nursing Notes were all reviewed and agreed with or any disagreements were addressed  in the HPI.    REVIEW OF SYSTEMS    (2-9 systems for level 4, 10 or more for level 5)     Review of Systems   Constitutional: Negative for chills, diaphoresis and fever.   HENT: Negative for sore throat and trouble swallowing.    Eyes: Positive for photophobia. Negative for pain and visual disturbance.   Respiratory: Negative for cough, shortness of breath and wheezing.    Cardiovascular: Negative for chest pain, palpitations and leg swelling.   Gastrointestinal: Negative for abdominal pain, constipation,  diarrhea, nausea and vomiting.   Endocrine: Negative for polydipsia and polyuria.   Genitourinary: Negative for dysuria, flank pain and hematuria.   Musculoskeletal: Negative for back pain and joint swelling.   Skin: Negative for rash.   Neurological: Positive for headaches. Negative for dizziness, weakness and numbness.   Hematological: Negative for adenopathy.   Psychiatric/Behavioral: Negative for confusion and suicidal ideas.   All other systems reviewed and are negative.      Except as noted above in the ROS, all other systems were reviewed and negative.       PAST MEDICAL HISTORY     Past Medical History:   Diagnosis Date   ??? Bisphosphonate-associated osteonecrosis of the jaw [facility])    ??? Celiac artery atherosclerosis 2015    60% proximal stenosis   ??? Colon polyps     adenomatous    ??? Dyspepsia     nonulcerative   ??? High cholesterol    ??? History of depression    ??? Migraine     recurrent   ??? Osteoarthritis of lumbar spine     Chunduri   ??? Postmenopausal    ??? Thoracic compression fracture ([place])     T10 from a fall         SURGICAL HISTORY       Past Surgical History:   Procedure Laterality Date   ??? ARTERY BIOPSY  Right 12-24-12    Temporal Artery biopsy right    ??? COLONOSCOPY  [id number]    dr Awanda Mink   3 years   ??? KYPHOSIS SURGERY      t10   ??? SHOULDER SURGERY      left humerus fx 3 places   ??? UPPER GASTROINTESTINAL ENDOSCOPY  08/26/2014    dr Shanon Brow hess:negative         [place]       Discharge Medication List as of 06/10/2016  3:33 PM      CONTINUE these medications which have NOT CHANGED    Details   PARoxetine (PAXIL) 20 MG tablet TAKE 2 1/2 TABLETS BY MOUTH DAILY, Disp-150 tablet, R-0Normal      topiramate (TOPAMAX) 100 MG tablet TAKE 1 TABLET BY MOUTH TWICE DAILY, Disp-180 tablet, R-0Normal      oxybutynin (DITROPAN-XL) 10 MG extended release tablet TAKE 1 TABLET BY MOUTH EVERY DAY, Disp-90 tablet, R-0Normal      Coenzyme Q10 (CO Q 10) 100 MG CAPS Take 200 mg by mouth.      Ascorbic Acid (VITAMIN C) 250 MG  tablet Take 1,000 mg by mouth daily.      Biotin 5000 MCG CAPS Take  by mouth.      Omega-3 Fatty Acids (FISH OIL) 500 MG CAPS Take 500 mg by mouth 2 times daily.      Calcium Carbonate-Vitamin D (CALTRATE 600+D) 600-400 MG-UNIT CHEW Take 1 tablet by mouth 2 times daily., Disp-60 tablet, R-11               ALLERGIES     Actonel [risedronate sodium]    FAMILY HISTORY       Family History   Problem Relation Age of Onset   ??? Migraines Other    ??? Diabetes Other    ??? Breast Cancer Sister    ??? Cancer Mother    ??? Cancer Father           SOCIAL HISTORY       Social History     Social History   ??? Marital status: Married     Spouse name: N/A   ??? Number of children: N/A   ??? Years of education: N/A     Social History Main Topics   ??? Smoking status: Former Smoker     Quit date: 08/06/1997   ??? Smokeless tobacco: Never Used      Comment: passive exposure husband chain smokes   ??? Alcohol use 0.0 oz/week      Comment: rarely   ??? Drug use: No   ??? Sexual activity: Not Currently     Other Topics Concern   ??? None     Social History Narrative   ??? None       SCREENINGS             PHYSICAL EXAM    (up to 7 for level 4, 8 or more for level 5)     ED Triage Vitals   BP Temp Temp src Pulse Resp SpO2 Height Weight   -- -- -- -- -- -- -- --       Physical Exam   Constitutional: She is oriented to person, place, and time. She appears well-developed and well-nourished. No distress.   HENT:   Head: Normocephalic and atraumatic.   Eyes: Conjunctivae and EOM are normal. Pupils are equal, round, and reactive to light. No scleral icterus.   No proptosis.   Neck: Neck  supple. No tracheal deviation present.   Cardiovascular: Normal rate, regular rhythm and intact distal pulses.    No murmur heard.  Pulmonary/Chest: Effort normal and breath sounds normal. No respiratory distress. She has no wheezes. She has no rales.   Abdominal: Soft. She exhibits no distension. There is no tenderness. There is no rebound and no guarding.   Musculoskeletal: Normal range  of motion. She exhibits no edema.   Lymphadenopathy:     She has no cervical adenopathy.   Neurological: She is alert and oriented to person, place, and time.   5/5 strength. Sensation intact. Pupils are 4 mm and equally reactive bilaterally   Skin: Skin is warm and dry.   Psychiatric: She has a normal mood and affect. Her behavior is normal.       DIAGNOSTIC RESULTS   LABS:    Labs Reviewed   URINE RT REFLEX TO CULTURE - Abnormal; Notable for the following:        Result Value    Blood, Urine SMALL (*)     All other components within normal limits    Narrative:     Performed at:  [facility]  [address]  [place] [address]   Phone [phone]   MICROSCOPIC URINALYSIS - Abnormal; Notable for the following:     RBC, UA 5-10 (*)     Bacteria, UA Rare (*)     All other components within normal limits    Narrative:     Performed at:  [facility]  [address],  [place] [address]   Phone [phone]       All other labs were within normal range or not returned as of this dictation.      EKG:   All EKG's are interpreted by the Emergency Department Physician who either signs or Co-signs this chart in the absence of a cardiologist.  Please see their note for interpretation of EKG.      RADIOLOGY:     ED Physician Imaging Interpretations:       Non-plain film images such as CT, Ultrasound and MRI are read by the radiologist. Plain radiographic images are visualized and preliminarily interpreted by the  ED Provider with the below findings:    Interpretation per the Radiologist below, if available at the time of this note:    No orders to display     No results found.      PROCEDURES   Unless otherwise noted below, none     Procedures    Oxygen Saturation interpretation: 100% on room air    Cardiac Monitor interpretation:    Behavior Modification Counseling:  (total time:)    CRITICAL CARE TIME        CONSULTS:  None      EMERGENCY DEPARTMENT COURSE and DIFFERENTIAL DIAGNOSIS/MDM:   Vitals:    Vitals:    06/10/16 1324 06/10/16 1332   BP: (!) 176/90    Pulse: 70    Resp:  18   Temp: 99.7 ??F (37.6 ??C)    TempSrc: Temporal    SpO2: 100%    Weight: 130 lb 11.7 oz (59.3 kg)    Height: 5\' 4"  (1.626 m)        Patient was given the following medications:  Medications   diazepam (VALIUM) injection 5 mg (5 mg Intravenous Not Given 06/10/16 1540)   metoclopramide (REGLAN) injection 10 mg (10  mg Intravenous Given 06/10/16 1352)   diphenhydrAMINE (BENADRYL) injection 50 mg (50 mg Intravenous Given 06/10/16 1358)   ketorolac (TORADOL) injection 15 mg (15 mg Intravenous Given 06/10/16 1402)   0.9 % sodium chloride bolus (0 mLs Intravenous Stopped 06/10/16 1500)   dexamethasone (DECADRON) injection 6 mg (6 mg Intravenous Given 06/10/16 1439)       DDX: Migraine headache, tension headache, dehydration, UTI, other    MDM:    70 year old female history of migraines presents today with complaints of a migraine headache. This is no different than her previous migraines. She has no focal neurologic deficits. IV was initiated. Patient was provided with medications. Urinalysis also ordered. Urinalysis is negative for acute infectious etiology. Patient had quite a bit of resolution of her symptoms with the headache cocktail. She was then given a dose of Decadron which almost fully resolved her headache. Patient feels that she is stable and appropriate for discharge. She will follow-up with her primary care physician in 24-48 hours. She will return immediately she develops any new or worsening symptoms. Patient has no signs or symptoms of subarachnoid hemorrhage. She has no signs or symptoms of meningitis. Her headache has nearly fully resolved and that she is stable for discharge    The patient tolerated their visit well.  They were seen and evaluated by the attending physician, Wilfred Lacy, MD who agreed with the assessment and  plan.  The patient and / or the family were informed of the results of any tests, a time was given to answer questions, a plan was proposed and they agreed with plan.        FINAL IMPRESSION      1. Migraine without status migrainosus, not intractable, unspecified migraine type          DISPOSITION/PLAN   DISPOSITION Decision to Discharge    PATIENT REFERRED TO:  Angelica Pou, [place] [address]  [phone]    Schedule an appointment as soon as possible for a visit in 1 day      [facility]  [place] [address]  [phone]    If symptoms worsen      DISCHARGE MEDICATIONS:  Discharge Medication List as of 06/10/2016  3:33 PM          DISCONTINUED MEDICATIONS:  Discharge Medication List as of 06/10/2016  3:33 PM      STOP taking these medications       atorvastatin (LIPITOR) 40 MG tablet Comments:   Reason for Stopping:         cyanocobalamin ([employer] VITAMIN B12) 1000 MCG tablet Comments:   Reason for Stopping:         atorvastatin (LIPITOR) 40 MG tablet Comments:   Reason for Stopping:                      (Please note that portions of this note were completed with a voice recognition program.  Efforts were made to edit the dictations but occasionally words are mis-transcribed.)    Wilfred Lacy, MD (electronically signed)            Wilfred Lacy, MD  06/10/16 [phone]

## 2016-06-10 NOTE — ED Notes (Signed)
Pt called out using call-light, pt states she needs to use restroom.  Pt states that her stomach feels better and she'll be able to tell how her head feels once she is up walking.  Pt ambulated to bathroom and back to room without difficulty.  Pt rates headache pain 5/10.     Campbell Lerner, RN  06/10/16 [phone]

## 2016-07-04 NOTE — Telephone Encounter (Signed)
Mailbox is full.I could not leave a message.

## 2016-07-05 NOTE — Telephone Encounter (Signed)
Mailbox was full. I could not leave a message.

## 2016-07-09 NOTE — Telephone Encounter (Signed)
Pt states she has a headache every day for a month. She said she cannot get rid of this and she "has tried everything she can think of". She is asking what Dr Garen Grams recommends? She is also questioning why Dr Garen Grams denied her scripts last week.    [id number]

## 2016-07-09 NOTE — Telephone Encounter (Signed)
Spoke to pt   She made an appt for tomorrow 07/10/2016 with dr Garen Grams

## 2016-07-09 NOTE — Telephone Encounter (Signed)
She is due for an appt   Someone should have called to explain  She needs to make an appt for a check up and then I refilll the meds right away

## 2016-07-10 ENCOUNTER — Ambulatory Visit: Admit: 2016-07-10 | Discharge: 2016-07-10 | Payer: MEDICARE | Attending: Internal Medicine | Primary: Internal Medicine

## 2016-07-10 DIAGNOSIS — G43901 Migraine, unspecified, not intractable, with status migrainosus: Secondary | ICD-10-CM

## 2016-07-10 MED ORDER — NADOLOL 20 MG PO TABS
20 MG | ORAL_TABLET | ORAL | 1 refills | Status: DC
Start: 2016-07-10 — End: 2016-07-17

## 2016-07-10 MED ORDER — PREDNISONE 10 MG PO TABS
10 MG | ORAL_TABLET | ORAL | 0 refills | Status: DC
Start: 2016-07-10 — End: 2016-07-17

## 2016-07-10 MED ORDER — OXYBUTYNIN CHLORIDE ER 10 MG PO TB24
10 | ORAL_TABLET | ORAL | 3 refills | Status: DC
Start: 2016-07-10 — End: 2017-06-10

## 2016-07-10 MED ORDER — BACLOFEN 10 MG PO TABS
10 MG | ORAL_TABLET | ORAL | 1 refills | Status: DC
Start: 2016-07-10 — End: 2016-07-17

## 2016-07-10 MED ORDER — PROMETHAZINE HCL 25 MG PO TABS
25 MG | ORAL_TABLET | Freq: Four times a day (QID) | ORAL | 1 refills | Status: DC | PRN
Start: 2016-07-10 — End: 2016-07-17

## 2016-07-10 NOTE — Telephone Encounter (Signed)
Mailbox is full.  I couldn't leave a message.

## 2016-07-10 NOTE — Progress Notes (Signed)
Vaccine Information Sheet, "Influenza - Inactivated"  given to Azzie Roup, or parent/legal guardian of  Secoya Campen Fabrizio and verbalized understanding.    Patient responses:    Have you ever had a reaction to a flu vaccine? No  Are you able to eat eggs without adverse effects?  Yes  Do you have any current illness?  No  Have you ever had Guillian Barre Syndrome?  No    Flu vaccine given per order. Please see immunization tab.

## 2016-07-10 NOTE — Progress Notes (Signed)
OUTPATIENT PROGRESS NOTE  Date of Service:  07/10/2016  Address: [facility]  [place] [address]  Dept: [phone]    Subjective:      Chief Complaint   Patient presents with   ??? Headache      Patient ID: [id number]  Linda [name] is a 70 y.o. female    HPIwith a history of migraines who reports 4 weeks of pain above eyes radiating to the vertex, photophobia,no aura, gets a headache every day. Cannot figure out what causes it.  Relpax no help, voltaren no help.  Gets very nauseated.  Tried smoking weed which didn't help.  She ran out of paxil a long while ago.  She was taking one topamax a day no help.  Sometimes advil and benadryl together help her headaches. She takes one a day at most. She was on inderal LA for a long time and it worked.    Allergies   Allergen Reactions   ??? Actonel [Risedronate Sodium]      Osteonecrosis jaw documented     Current Outpatient Prescriptions   Medication Sig Dispense Refill   ??? oxybutynin (DITROPAN-XL) 10 MG extended release tablet TAKE 1 TABLET BY MOUTH EVERY DAY 90 tablet 0   ??? PARoxetine (PAXIL) 20 MG tablet TAKE 2 1/2 TABLETS BY MOUTH DAILY 150 tablet 0   ??? topiramate (TOPAMAX) 100 MG tablet TAKE 1 TABLET BY MOUTH TWICE DAILY 180 tablet 0   ??? Coenzyme Q10 (CO Q 10) 100 MG CAPS Take 200 mg by mouth.     ??? Ascorbic Acid (VITAMIN C) 250 MG tablet Take 1,000 mg by mouth daily.     ??? Biotin 5000 MCG CAPS Take  by mouth.     ??? Omega-3 Fatty Acids (FISH OIL) 500 MG CAPS Take 500 mg by mouth 2 times daily.     ??? Calcium Carbonate-Vitamin D (CALTRATE 600+D) 600-400 MG-UNIT CHEW Take 1 tablet by mouth 2 times daily. 60 tablet 11     No current facility-administered medications for this visit.      Past Medical History:   Diagnosis Date   ??? Bisphosphonate-associated osteonecrosis of the jaw [facility])    ??? Celiac artery atherosclerosis 2015    60% proximal stenosis   ??? Colon polyps     adenomatous    ??? Dyspepsia      nonulcerative   ??? High cholesterol    ??? History of depression    ??? Migraine     recurrent   ??? Osteoarthritis of lumbar spine     Chunduri   ??? Postmenopausal    ??? Thoracic compression fracture ([place])     T10 from a fall     Past Surgical History:   Procedure Laterality Date   ??? ARTERY BIOPSY Right 12-24-12    Temporal Artery biopsy right    ??? COLONOSCOPY  [id number]    dr Awanda Mink   3 years   ??? KYPHOSIS SURGERY      t10   ??? SHOULDER SURGERY      left humerus fx 3 places   ??? UPPER GASTROINTESTINAL ENDOSCOPY  08/26/2014    dr Shanon Brow hess:negative     Social History   Substance Use Topics   ??? Smoking status: Former Smoker     Quit date: 08/06/1997   ??? Smokeless tobacco: Never Used      Comment: passive exposure husband chain smokes   ??? Alcohol  use 0.0 oz/week      Comment: rarely     Family History   Problem Relation Age of Onset   ??? Migraines Other    ??? Diabetes Other    ??? Breast Cancer Sister    ??? Cancer Mother    ??? Cancer Father           ROS    Objective:   Physical Exam   Constitutional: She is oriented to person, place, and time and well-developed, well-nourished, and in no distress. No distress.   HENT:   Head: Normocephalic and atraumatic.   Right Ear: External ear normal.   Left Ear: External ear normal.   Nose: Nose normal.   Mouth/Throat: No oropharyngeal exudate.   Eyes: Conjunctivae and EOM are normal. Pupils are equal, round, and reactive to light. Right eye exhibits no discharge. Left eye exhibits no discharge. No scleral icterus.   Neck: Normal range of motion. Neck supple. No JVD present. No tracheal deviation present. No thyromegaly present.   Cardiovascular: Normal rate, regular rhythm and normal heart sounds.  Exam reveals no gallop.    No murmur heard.  Pulmonary/Chest: Effort normal and breath sounds normal. No stridor. No respiratory distress. She has no wheezes. She has no rales. She exhibits no tenderness.   Musculoskeletal: Normal range of motion. She exhibits no edema or tenderness.   Lymphadenopathy:      She has no cervical adenopathy.   Neurological: She is alert and oriented to person, place, and time. She has normal reflexes. No cranial nerve deficit. She exhibits normal muscle tone. Gait normal. Coordination normal. GCS score is 15.   Skin: Skin is warm and dry. No rash noted. She is not diaphoretic. No pallor.   Psychiatric: Mood, memory, affect and judgment normal.   Nursing note and vitals reviewed.        Assessment/Plan   1. Need for influenza vaccination  - INFLUENZA, HIGH DOSE, 65 YRS +, IM, PF, PREFILL SYR, 0.5ML (FLUZONE HD)      2. Status migraine:    Will try Nadolol for prevention and prednisone taper, baclofen prn headache  And trying to avoid nsaid  Try prednisone taper, baclofen, phenergan            Angelica Pou, MD

## 2016-07-17 ENCOUNTER — Ambulatory Visit: Admit: 2016-07-17 | Discharge: 2016-07-17 | Payer: MEDICARE | Attending: Internal Medicine | Primary: Internal Medicine

## 2016-07-17 DIAGNOSIS — G43701 Chronic migraine without aura, not intractable, with status migrainosus: Secondary | ICD-10-CM

## 2016-07-17 MED ORDER — AMITRIPTYLINE HCL 25 MG PO TABS
25 MG | ORAL_TABLET | ORAL | 3 refills | Status: DC
Start: 2016-07-17 — End: 2016-10-02

## 2016-07-17 MED ORDER — ZOLMITRIPTAN 5 MG PO TABS
5 | ORAL_TABLET | ORAL | 3 refills | Status: DC | PRN
Start: 2016-07-17 — End: 2017-06-10

## 2016-07-17 MED ORDER — NADOLOL 20 MG PO TABS
20 | ORAL_TABLET | ORAL | 1 refills | Status: DC
Start: 2016-07-17 — End: 2016-07-31

## 2016-07-17 NOTE — Progress Notes (Signed)
OUTPATIENT PROGRESS NOTE  Date of Service:  07/17/2016  Address: [facility]  [place] [id number]  Dept: [phone]    Subjective:      Chief Complaint   Patient presents with   ??? Migraine      Patient ID: [id number]  Linda [name] is a 70 y.o. female    HPIwith status migraine who is here for follow up on chronic daily headaches.  She cannot sleep since the headaches started.  She only had one good day the second day.  It is constant and all over her head.  The intensity of her headache is less this week then last . Still has photophobia, and nausea.  She thinks no help from phenergan or baclofen.  She stopped taking the topamax a few months ago but she is not sure they ever helped.  SHe has never gotten botox.    Allergies   Allergen Reactions   ??? Actonel [Risedronate Sodium]      Osteonecrosis jaw documented     Current Outpatient Prescriptions   Medication Sig Dispense Refill   ??? oxybutynin (DITROPAN-XL) 10 MG extended release tablet TAKE 1 TABLET BY MOUTH EVERY DAY 90 tablet 3   ??? predniSONE (DELTASONE) 10 MG tablet 3 tabs po bid x 3 d then 2 tabs po bid x 3 d then 1 tabs po bid x 3d then 1 po qam x 1 d 37 tablet 0   ??? PARoxetine (PAXIL) 20 MG tablet TAKE 2 1/2 TABLETS BY MOUTH DAILY 150 tablet 0   ??? Coenzyme Q10 (CO Q 10) 100 MG CAPS Take 200 mg by mouth.     ??? Ascorbic Acid (VITAMIN C) 250 MG tablet Take 1,000 mg by mouth daily.     ??? Biotin 5000 MCG CAPS Take  by mouth.     ??? Calcium Carbonate-Vitamin D (CALTRATE 600+D) 600-400 MG-UNIT CHEW Take 1 tablet by mouth 2 times daily. 60 tablet 11   ??? nadolol (CORGARD) 20 MG tablet Start with one pill at night for a week then increase to 2 a night 60 tablet 1   ??? promethazine (PHENERGAN) 25 MG tablet Take 1 tablet by mouth every 6 hours as needed for Nausea 30 tablet 1   ??? baclofen (LIORESAL) 10 MG tablet One to two at onset of headache up to tid 90 tablet 1   ??? topiramate (TOPAMAX) 100 MG  tablet TAKE 1 TABLET BY MOUTH TWICE DAILY 180 tablet 0   ??? Omega-3 Fatty Acids (FISH OIL) 500 MG CAPS Take 500 mg by mouth 2 times daily.       No current facility-administered medications for this visit.      Past Medical History:   Diagnosis Date   ??? Bisphosphonate-associated osteonecrosis of the jaw [facility])    ??? Celiac artery atherosclerosis 2015    60% proximal stenosis   ??? Colon polyps     adenomatous    ??? Dyspepsia     nonulcerative   ??? High cholesterol    ??? History of depression    ??? Migraine     recurrent   ??? Osteoarthritis of lumbar spine     Chunduri   ??? Postmenopausal    ??? Thoracic compression fracture ([place])     T10 from a fall     Past Surgical History:   Procedure Laterality Date   ??? ARTERY BIOPSY Right 12-24-12    Temporal Artery  biopsy right    ??? COLONOSCOPY  [id number]    dr Awanda Mink   3 years   ??? KYPHOSIS SURGERY      t10   ??? SHOULDER SURGERY      left humerus fx 3 places   ??? UPPER GASTROINTESTINAL ENDOSCOPY  08/26/2014    dr Shanon Brow hess:negative     Social History   Substance Use Topics   ??? Smoking status: Former Smoker     Quit date: 08/06/1997   ??? Smokeless tobacco: Never Used      Comment: passive exposure husband chain smokes   ??? Alcohol use 0.0 oz/week      Comment: rarely     Family History   Problem Relation Age of Onset   ??? Migraines Other    ??? Diabetes Other    ??? Breast Cancer Sister    ??? Cancer Mother    ??? Cancer Father           ROS    Objective:   Physical Exam   Less pale  Vitals as above on one nadolol    Assessment/Plan     1 migraines: no better with steroid taper, did better with amitriptylline in the past  May respond to botox and or mab treatment  Plan add elavil 25 mg to 50 mg   Hold corgard at 20 mg   Try Zomig for her   Refer to UC for neurology specialyy                Angelica Pou, MD

## 2016-07-17 NOTE — Patient Instructions (Signed)
Always keep the same sleep schedule go to bed and get up the same time,   Only have 2 cups of caffeinated drinks per day max, stop after NOON  Avoid food triggers, : chocolate, wine, aged cheese , MSG  Always eat your meals at the same of day dont go over 6 hours without eating

## 2016-07-31 ENCOUNTER — Ambulatory Visit: Admit: 2016-07-31 | Discharge: 2016-07-31 | Payer: MEDICARE | Attending: Internal Medicine | Primary: Internal Medicine

## 2016-07-31 DIAGNOSIS — G43011 Migraine without aura, intractable, with status migrainosus: Secondary | ICD-10-CM

## 2016-07-31 MED ORDER — NADOLOL 20 MG PO TABS
20 MG | ORAL_TABLET | ORAL | 3 refills | Status: DC
Start: 2016-07-31 — End: 2016-10-02

## 2016-07-31 NOTE — Telephone Encounter (Signed)
Pt was seen today and was told to call back to let dr know what meds she is taking at this time, pt did say that she is no longer taking Paxil.   Pt can be reached at [phone] if dr has any further questions.      Pt takes meds listed below:      AMITRIPTYLINE 25 MG TAB TAKES ONE TABLET BY MOUTH NIGHTLY    OXYBUTYNIN 10 MG TAB TAKES ONE TABLET BY MOUTH DAILY    NADOLOL 20 MG TAB TAKES 2 TABLETS BY MOUTH NIGHTLY

## 2016-07-31 NOTE — Progress Notes (Signed)
OUTPATIENT PROGRESS NOTE  Date of Service:  07/31/2016  Address: [facility]  [place] [address]  Dept: [phone]    Subjective:      Chief Complaint   Patient presents with   ??? Headache      Patient ID: [id number]  Linda [name] is a 70 y.o. female    HPIwith bad migraines wo [place] who had an intractable headache for five weeks.  She is finally getting relief from amitriptylline.  She did stop the corgard.  She is not having any further headaches.  She did get a headache for some wine.    Allergies   Allergen Reactions   ??? Actonel [Risedronate Sodium]      Osteonecrosis jaw documented     Current Outpatient Prescriptions   Medication Sig Dispense Refill   ??? amitriptyline (ELAVIL) 25 MG tablet One a night for PREVENTION of migraines 30 tablet 3   ??? ZOLMitriptan (ZOMIG) 5 MG tablet Take 1 tablet by mouth as needed for Migraine 6 tablet 3   ??? oxybutynin (DITROPAN-XL) 10 MG extended release tablet TAKE 1 TABLET BY MOUTH EVERY DAY 90 tablet 3   ??? PARoxetine (PAXIL) 20 MG tablet TAKE 2 1/2 TABLETS BY MOUTH DAILY 150 tablet 0   ??? Calcium Carbonate-Vitamin D (CALTRATE 600+D) 600-400 MG-UNIT CHEW Take 1 tablet by mouth 2 times daily. 60 tablet 11     No current facility-administered medications for this visit.      Past Medical History:   Diagnosis Date   ??? Bisphosphonate-associated osteonecrosis of the jaw [facility])    ??? Celiac artery atherosclerosis 2015    60% proximal stenosis   ??? Colon polyps     adenomatous    ??? Dyspepsia     nonulcerative   ??? High cholesterol    ??? History of depression    ??? Migraine     recurrent   ??? Osteoarthritis of lumbar spine     [place]   ??? Postmenopausal    ??? Thoracic compression fracture ([place])     T10 from a fall     Past Surgical History:   Procedure Laterality Date   ??? ARTERY BIOPSY Right 12-24-12    Temporal Artery biopsy right    ??? COLONOSCOPY  [id number]    dr Awanda Mink   3 years   ??? KYPHOSIS SURGERY      t10   ??? SHOULDER  SURGERY      left humerus fx 3 places   ??? UPPER GASTROINTESTINAL ENDOSCOPY  08/26/2014    dr Shanon Brow hess:negative     Social History   Substance Use Topics   ??? Smoking status: Former Smoker     Quit date: 08/06/1997   ??? Smokeless tobacco: Never Used      Comment: passive exposure husband chain smokes   ??? Alcohol use 0.0 oz/week      Comment: rarely     Family History   Problem Relation Age of Onset   ??? Migraines Other    ??? Diabetes Other    ??? Breast Cancer Sister    ??? Cancer Mother    ??? Cancer Father           ROS    Objective:   Physical Exam      Assessment/Plan     1  Intractable migraine  Much better  Observe for 2 months goal is less than 4 headaches per month  Angelica Pou, MD

## 2016-08-02 ENCOUNTER — Telehealth

## 2016-08-02 NOTE — Telephone Encounter (Signed)
Will hold

## 2016-08-02 NOTE — Telephone Encounter (Signed)
Pt is calling back to let dr know that she called [facility] and they cannot get her in until April and pt cannot wait that long " or she is going to shoot herself " she wants our office to call [facility] to get her seen sooner, the number to [facility] is [phone], pt would like our office to try and get her seen sooner, pt will wait for Dr. Garen Grams to address this message on Friday, she can be reached again at [phone].

## 2016-08-02 NOTE — Telephone Encounter (Signed)
I'm not sure with Dr. Garen Grams will want to do. Does the patient want to see one of the doctors today?

## 2016-08-02 NOTE — Telephone Encounter (Signed)
Please advise.

## 2016-08-02 NOTE — Telephone Encounter (Signed)
Pt is calling to let dr know that she still has the headache that she had on Dec 25 and that the Zomig is not helping the pain, pt can reached at [phone], pt uses [facility] in [place] at [phone], pt is aware that her message may not be addressed until Friday when Dr Garen Grams is in the office.

## 2016-08-03 NOTE — Telephone Encounter (Signed)
She is coming in before her appt on Tues

## 2016-08-03 NOTE — Telephone Encounter (Signed)
Please have her come in for ESR on Tues  I spoke with her about coming in and also raising her amitriptylline to 75 mg at night      Suz please help me get her into a neurologist for headaches asap  Either [facility] or [facility]

## 2016-08-03 NOTE — Telephone Encounter (Signed)
Great but still want her to get esr

## 2016-08-03 NOTE — Telephone Encounter (Signed)
Seeing Dr.Vallabhuni on Jan 2.

## 2016-08-20 ENCOUNTER — Inpatient Hospital Stay: Admit: 2016-08-20 | Attending: Neurology | Primary: Internal Medicine

## 2016-08-20 DIAGNOSIS — R899 Unspecified abnormal finding in specimens from other organs, systems and tissues: Secondary | ICD-10-CM

## 2016-08-31 NOTE — Telephone Encounter (Signed)
Patient states she saw Dr Neale Burly and she did lab work that came back abnormal so she had a US done on 08/20/16 and a MRI done on 08/23/16 and she has a  Tumor  paratoid gland? Her headaches are ceasing if you need to call her [phone]

## 2016-08-31 NOTE — Telephone Encounter (Signed)
Very interesting  Glad she feeling better

## 2016-08-31 NOTE — Telephone Encounter (Signed)
Husband notified that Dr. Garen Grams stated It was interesting and glad she was feeling better.

## 2016-09-14 NOTE — Other (Unsigned)
Patient ID: Linda [name]  Age: 71 y.o. (DOB: [birth year])  Ethnicity: Non-Hispanic  Race: White or Caucasian  Gender: female    Vitals:   09/14/16 1434  Weight: 128 lb (58.1 kg)  Height: 5\' 4"  (1.626 m)    Chief Complaint  Patient presents with  ?????? Mass, Parotid    Patient has history of intractable headaches which have worsened over the  prior 6 months; recently underwent imaging and was told she had an   42mmx11mmx8mm  parotid tumor; has been referred to ENT for evaluation      HPI:  The patient is a 71 year old female who presents to the ENT office for  evaluation at the request of her primary care physician, Dr. Angelica Pou, for  consultation due to a mass in the parotid gland.  The patient reports that she  has a mass in the parotid gland that was found incidentally on an MRI.  The   MRI  was done for headaches.  She has been doing better for the headaches but she   has  had no treatment for this.  The parotid mass is not palpable, it is otherwise  asymptomatic.  She is otherwise healthy.      ROS:  Constitutional     Fever     Chills     Weight Change   Respiratory     Cough     Shortness of Breath     Cough with Blood Emotional     Trouble Sleeping     Anxiety     Trouble Concentrating     Mood Swings  Eyes     Blindness     Double Vision / Blurred Vision     Burning Urinary     Prostate enlargement     Obstruction from meds Endocrine     Weakness     Heat or Cold Intolerance     Increased Thirst     Frequent Urination     Hair Loss  Cardiovascular     Chest Pain     Palpitations     Irregular Heart Beats     Ankle Edema or Swelling   Gastrointestinal     Abdominal Pain     Bloating     Constipation     Diarrhea     Heartburn or Reflux Hematological     Bleeding     Bruising     Enlarged Lymph Nodes     Anemia  Musculoskeletal     Joint Swelling     Gout     Muscle Aches Skin     Rash     Skin Cancer     Itching     Change in [facility]     Recent Travel Abroad     Work Exposures  Neurological      Weakness     Loss of Coordination     Dizziness     Headaches     Seizures     Tremors     Stroke Immunological     Food Sensitivity     Asthma     Recent Vaccinations     All other systems reviewed are negative    Allergies  Allergen Reactions  ?????? Nka [No Known Allergies]    Current Outpatient Prescriptions  Medication Sig Dispense Refill  ?????? amitriptyline (ELAVIL) 25 mg tablet One a night for PREVENTION of   migraines  ?????? BIOTIN PO 1 Tab 2 times daily.  ??????  Calcium-Cholecalciferol, D3, (CALCIUM 600 + D,3,) 600 mg calcium- 200 unit   PO  Cap 1 Tab 2 times daily.  ?????? oxybutynin (DITROPAN-XL) 10 mg CR tablet Take 10 mg by mouth every   evening.  ?????? RED YEAST RICE PO Take 1 Tab by mouth 2 times daily.    No current facility-administered medications for this visit.    Past Surgical History:  Procedure Laterality Date  ?????? BREAST SURGERY   bil breast reduction  ?????? HX ORTHOPEDIC SURGERY   left arm  ?????? SPINE SURGERY PROCEDURE UNLISTED   t10 vertebroplasty twice    Past Medical History:  Diagnosis Date  ?????? Back problem  ?????? DDD (degenerative disc disease)   stenosis  ?????? GERD (gastroesophageal reflux disease)  ?????? HX-CARDIOVASULAR PROBLEMS  ?????? HX-GASTROINTESTINAL PROBLEMS  ?????? HX-MUSCULOSKELETAL PROBLEMS  ?????? Hyperlipidemia  ?????? Leg pain, left    pt. reported developing pain down left leg s/p epidural injection - pain   comes  and goes  ?????? Migraine   in past  ?????? Spondylolisthesis    Family History  Problem Relation Age of Onset  ?????? Cancer Mother    breast cancer  ?????? Cancer Father    bone cancer  ?????? Heart Problems Maternal Aunt  ?????? Anesthesia Complications Neg Hx    Social History    Social History  ?????? Marital status: Married    Spouse name: N/A  ?????? Number of children: N/A  ?????? Years of education: N/A    Occupational History  ?????? Not on file.    Social History Main Topics  ?????? Smoking status: Former Smoker  ?????? Smokeless tobacco: Never Used     Comment: quit 25 years ago  ?????? Alcohol use Yes     Comment:  occasional  ?????? Drug use: No  ?????? Sexual activity: Not on file    Other Topics Concern  ?????? Not on file    Social History Narrative  ?????? No narrative on file      Physical Exam:  HEAD: normocephalic, atraumatic  CONSTITUTIONAL: well developed, well nourished, no acute distress  NEUROLOGICAL: cranial nerves 2-12 intact,  PSYCHIATRIC: affect normal, judgement normal, alert and orientd x 3  SKIN: warm and dry, no lesions or rash  RESPIRATORY: no stridor, no stertor, no noisy breathing, no audible wheezing  EYES: PERRL, EOM normal  EARS: external auditory canals normal, tympanic membranes intact, normal  landmarks, middle ears clear  ORAL CAVITY: oral mucosa is moist and pink without lesions  NOSE: no deformities, nares patent, normal mucosa without swelling, no polyps,  no bleeding.  MUSCULOSKELETAL: gait and station normal  The neck exam does demonstrate some possible palpable fullness in the left  parotid gland.      Labs:  Results for orders placed or performed during the hospital encounter of   06/19/12  1. STAPH AUREUS CULT, SWAB     Status: None  Alfard Cochrane Value Ref Range Status   Comment  N/A Final    Specimen/Source: SWAB/NASAL PASSAGE  Collected: 06/19/2012 14:05    Status: Final      Last Updated: 06/22/2012 08:02        ISO1 (Final)      No Staph aureus Isolated      2. APTT     Status: None  Azaylea Maves Value Ref Range Status   PTT 34.4 23.1 - 37.6 seconds Final  3. PT (PRO TIME INCLUDES INR)     Status: None  Leonardo Makris Value Ref Range Status  Protime 10.5 9.0 - 12.1 seconds Final   INR 1.0 0.9 - 1.1 Final    Comment: RECOMMENDED THERAPEUTIC RANGES USING INR :    Stable Oral Anticoagulant Therapy:         2.0 - 3.0    Mechanical Prosthetic Heart Valve:         2.5 - 3.5    Recurrent Acute Myocardial Infarction:    2.5 - 3.5  4. CBC (COMPLETE BLOOD COUNT)     Status: Abnormal  Miah Boye Value Ref Range Status   WBC 6.8 3.8 - 10.8 10*3/uL Final   RBC 4.10 3.80 - 5.10 10*6/uL Final   Hemoglobin 12.8 11.7 - 15.5 g/dL  Final   Hematocrit Blood 38.8 35.0 - 45.0 % Final   MCV 94.5 80.0 - 100.0 fL Final   MCH 31.3 27.0 - 33.0 pg Final   MCHC 33.1 32.0 - 36.0 g/dL Final   RDW 15.1 (HIGH) 11.0 - 15.0 % Final   Platelets 216 140 - 400 10*3/uL Final   MPV 8.2 7.5 - 11.5 fL Final    Imaging:    Plan  A/P:    ICD-9-CM ICD-10-CM  1. Parotid mass 784.2 K11.9 CT-NECK WITH CONTRAST     US-ASP FINE NDL W IMAGING    No Encounter Medications    The patient is a 71 year old female who presents to the ENT office with a left  parotid mass.  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  I have no images   that  I can visualize today, but I was able to get outside reports from another   office  that showed this on the report.  I would like to get a CT scan of the neck   with  contrast to see if this a superficial or deep lobe parotid mass.  I would like  to get an ultrasound FNA of this mass.  Both of these are ordered today.  Depending on results, I think her most likely treatment option is going to be  surgical intervention with a left parotidectomy and facial nerve dissection,   but  we will need to discuss this further based on the results of the imaging and  fine needle aspiration.  It is possible based on these results that we might   be  able to watch this.  All of her questions were answered today.  She was  reassured.  She will follow-up with the results of the imaging and needle  biopsy.

## 2016-10-02 ENCOUNTER — Encounter: Attending: Internal Medicine | Primary: Internal Medicine

## 2016-10-02 ENCOUNTER — Ambulatory Visit: Admit: 2016-10-02 | Discharge: 2016-10-02 | Payer: MEDICARE | Attending: Internal Medicine | Primary: Internal Medicine

## 2016-10-02 DIAGNOSIS — G43719 Chronic migraine without aura, intractable, without status migrainosus: Secondary | ICD-10-CM

## 2016-10-02 MED ORDER — AMITRIPTYLINE HCL 25 MG PO TABS
25 | ORAL_TABLET | ORAL | 3 refills | Status: DC
Start: 2016-10-02 — End: 2017-06-18

## 2016-10-02 NOTE — Patient Instructions (Signed)
Parotid removal consider [facility] [phone]

## 2016-10-05 NOTE — Other (Unsigned)
Progress Notes by Josie Dixon., MD at 10/05/16 1400     Author:  Josie Dixon., MD Service:  (none) Author Type:  Physician    Filed:  10/15/16 1046 Encounter Date:  10/05/2016 Status:  Signed    Editor:  Josie Dixon., MD (Physician)         Patient ID:[KG.1] Linda [name][CB.1]  Age:[KG.1] 71 y.o.[CB.1] (DOB:[KG.1] March 16, 1946[CB.1])[KG.1]  Ethnicity: Non-Hispanic  Race: White or Caucasian[CB.1]  Gender:[KG.1] female    Vitals:     10/05/16 1353   Weight: 128 lb (58.1 kg)   Height: 5\' 4"  (1.626 m)     Chief Complaint     Patient presents with     ??????? Mass, Parotid      Patient presents today to review her CT scan, and fna results. She will   discuss her options.[CB.1]     PHYSHPI    HPI:[KG.1]  The patient is a 71 year old female who presents to the ENT office with a   history of a parotid mass on the left side.  This was found incidentally on   MRI.  She was sent for a CT scan and she was sent for an ultrasound guided   fine needle aspiration and presents today to review the results.  She denies   any fevers, chills, or infections.  She denies any other symptoms from this.    It was completely asymptomatic and found incidentally.  She has no other   symptoms or issues today.[KT.1]      ROS:  Constitutional    Fever    Chills    Weight Change  Respiratory    Cough      Shortness of Breath    Cough with Blood Emotional    Trouble Sleeping      Anxiety    Trouble Concentrating    Mood Swings   Eyes    Blindness    Double Vision / Blurred Vision    Burning Urinary      Prostate enlargement    Obstruction from meds Endocrine    Weakness    Heat or   Cold Intolerance    Increased Thirst    Frequent Urination    Hair Loss   Cardiovascular    Chest Pain    Palpitations    Irregular Heart Beats    Ankle   Edema or Swelling  Gastrointestinal    Abdominal Pain    Bloating      Constipation    Diarrhea    Heartburn or Reflux Hematological    Bleeding      Bruising    Enlarged Lymph Nodes     Anemia   Musculoskeletal    Joint Swelling    Gout    Muscle Aches Skin    Rash    Skin   Cancer    Itching    Change in [facility]    Recent Travel Abroad    Work   Exposures   Neurological    Weakness      Loss of Coordination    Dizziness    Headaches      Seizures    Tremors    Stroke Immunological    Food Sensitivity    Asthma      Recent Vaccinations       All other systems reviewed are negative.[KG.1]      Allergies     Allergen  Reactions   ??????? Nka [No Known Allergies]  Current Outpatient Prescriptions       Medication  Sig Dispense Refill   ??????? amitriptyline (ELAVIL) 25 mg tablet One a night for PREVENTION of   migraines     ??????? BIOTIN PO 1 Tab 2 times daily.     ??????? Calcium-Cholecalciferol, D3, (CALCIUM 600 + D,3,) 600 mg calcium- 200 unit   PO Cap 1 Tab 2 times daily.     ??????? oxybutynin (DITROPAN-XL) 10 mg CR tablet Take 10 mg by mouth every   evening.     ??????? RED YEAST RICE PO Take 1 Tab by mouth 2 times daily.       No current facility-administered medications for this visit.      Past Surgical History:      Procedure  Laterality Date   ??????? BREAST SURGERY      bil breast reduction     ??????? HX ORTHOPEDIC SURGERY      left arm     ??????? SPINE SURGERY PROCEDURE UNLISTED      t10 vertebroplasty twice       Past Medical History:     Diagnosis  Date   ??????? Back problem    ??????? DDD (degenerative disc disease)     stenosis    ??????? GERD (gastroesophageal reflux disease)    ??????? HX-CARDIOVASULAR PROBLEMS    ??????? HX-GASTROINTESTINAL PROBLEMS    ??????? HX-MUSCULOSKELETAL PROBLEMS    ??????? Hyperlipidemia    ??????? Leg pain, left      pt. reported developing pain down left leg s/p epidural injection - pain   comes and goes    ??????? Migraine     in past    ??????? Spondylolisthesis      Family History       Problem   Relation Age of Onset   ??????? Cancer  Mother      breast cancer     ??????? Cancer  Father      bone cancer     ??????? Heart Problems  Maternal Aunt    ??????? Anesthesia Complications  Neg Hx      Social History     Social  History      ??????? Marital status:  Married     Spouse name: N/A   ??????? Number of children:  N/A   ??????? Years of education:  N/A     Occupational History    ??????? Not on file.     Social History Main Topics       ??????? Smoking status:   Former Smoker   ??????? Smokeless tobacco:   Never Used      Comment: quit 25 years ago    ??????? Alcohol use   Yes      Comment: occasional    ??????? Drug use:   No   ??????? Sexual activity:   Not on file     Other Topics  Concern   ??????? Not on file      Social History Narrative    ??????? No narrative on file[CB.1]     PHYSPE    Physical Exam:[KG.1]  HEAD: normocephalic, atraumatic  CONSTITUTIONAL: well developed, well nourished, no acute distress  NEUROLOGICAL: cranial nerves 2-12 intact,   PSYCHIATRIC: affect normal, judgement normal, alert and orientd x 3  SKIN: warm and dry, no lesions or rash  RESPIRATORY: no stridor, no stertor, no noisy breathing, no audible wheezing  EYES: PERRL, EOM normal  EARS: external auditory canals normal, tympanic membranes intact, normal  landmarks, middle ears clear  ORAL CAVITY: oral mucosa is moist and pink without lesions  NOSE: no deformities, nares patent, normal mucosa without swelling, no polyps,   no bleeding.  NECK: supple, no masses, no adenopathy  MUSCULOSKELETAL: gait and station normal[KT.1]      Labs:[KG.1]  Results for orders placed or performed during the hospital encounter of   06/19/12        1. STAPH AUREUS CULT, SWAB     Status: None        Jashan Cotten   Value Ref Range Status    Comment   N/A Final     Specimen/Source: SWAB/NASAL PASSAGE Collected: 06/19/2012 14:05   Status:   Final      Last Updated: 06/22/2012 08:02             ISO1 (Final)     No   Staph aureus Isolated          2. APTT     Status: None        Temperance Kelemen   Value Ref Range Status    PTT  34.4 23.1 - 37.6 seconds Final   3. PT (PRO TIME INCLUDES INR)     Status: None        Kinda Pottle   Value Ref Range Status    Protime  10.5 9.0 - 12.1 seconds Final    INR  1.0 0.9 -  1.1 Final     Comment: RECOMMENDED THERAPEUTIC RANGES USING INR :   Stable Oral   Anticoagulant Therapy:         2.0 - 3.0   Mechanical Prosthetic Heart Valve:           2.5 - 3.5   Recurrent Acute Myocardial Infarction:    2.5 - 3.5     4. CBC (COMPLETE BLOOD COUNT)     Status: Abnormal        Zsazsa Bahena   Value Ref Range Status    WBC  6.8 3.8 - 10.8 10*3/uL Final    RBC  4.10 3.80 - 5.10 10*6/uL Final    Hemoglobin  12.8 11.7 - 15.5 g/dL Final    Hematocrit Blood  38.8 35.0 - 45.0 % Final    MCV  94.5 80.0 - 100.0 fL Final    MCH  31.3 27.0 - 33.0 pg Final    MCHC  33.1 32.0 - 36.0 g/dL Final    RDW  15.1 (HIGH) 11.0 - 15.0 % Final    Platelets  216 140 - 400 10*3/uL Final    MPV  8.2 7.5 - 11.5 fL Final[CB.1]     Imaging:[KG.1]  CT Neck With[KT.1]    Results for orders placed during the hospital encounter of 09/28/16     CT-NECK WITH CONTRAST 09/28/2016 09/28/2016 (Final)    Status: Normal   09/28/2016      Narrative History: Parotid mass.  COMPARISON: None.  TECHNIQUE: Following   administration of 80 cc of Omnipaque [id number] IV, helically acquired axial images   were obtained through the neck with multiplanar reformats.  FINDINGS: There is   streak artifact through portions of the parotid glands related to dental   hardware. This mildly obscures visualization of portions of the parotid glands   bilaterally. There is no evidence of focal parotid mass on either side.  The   submandibular glands are normal. The thyroid is normal in size.  A nonspecific   right level 3 anterior jugular chain lymph node measures 0.9  x 0.6 cm. A few   smaller scattered cervical lymph nodes are present but no evidence of   significant lymphadenopathy.  The nasopharynx, oropharynx, hypopharynx, and   larynx are normal. The retropharyngeal soft tissues are normal. The internal   jugular veins and carotid arteries are patent bilaterally. Moderate after   sclerotic calcification of the left carotid bulb and mild atherosclerotic   calcification of  the right carotid bulb without high-grade stenosis.  The   visualized paranasal sinuses are clear. There are no acute osseous   abnormalities. Degenerative cervical spondylosis is noted with moderate disc   space narrowing at C5-C6 and C6-C7.  Incidental note of surgical clips in the   subcutaneous right temporal region.  Lung apices are clear.     Impression IMPRESSION: 1. No evidence of focal mass within the parotid glands   with small portions of the parotid glands partially obscured by streak   artifact from dental hardware. Clinical correlation is recommended. If there   is specific clinical evidence of a parotid mass, further evaluation with   targeted ultrasound may be useful. 2. No cervical lymphadenopathy. 3. No acute   abnormality in the neck.  Signed By: Rema Fendt MD, Adam[KT.2]     Plan [CB.1]   A/P:[KG.1]    ICD-9-CM ICD-10-CM    1. Parotid mass 784.2 K11.9 US-THYROID/NECK/HEAD     No Encounter Medications[CB.1]    The patient is a 71 year old female who has a parotid mass that was found   incidentally.  It is asymptomatic.  The patient underwent ultrasound guided   fine needle aspiration and a CT scan.  On CT scan there is actually no visible   mass, but the ultrasound does show a mass that measures about 1 cm.  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  Based on this I have   recommended removal with a left superficial parotidectomy with facial nerve   dissection.  We discussed this at length today.  We discussed the risks and   benefits of surgery including bleeding, infection, pain, need for further   procedures, and failure of the procedure to obtain the desired results.  She   understands the risk of facial nerve injury that is about 1%.  She understands   the risk of 10% chance of malignant transformation to pleomorphic ex   carcinoma.  At this point she is not interested in surgery.  I recommended   against that but she is not interested, so for the time being we are going to    monitor this with a repeat ultrasound.  She is going to consider surgery and   discuss it with her husband.  I am going to see her back in six months with a   repeat  ultrasound.  In the  meantime she is going to get a copy of her MRI so   I could look at the pictures of it since her tumor is not visible on CT scan.    I will see her back in six months and sooner if she decides for   surgery.[KT.1]         Electronically signed by Josie Dixon., MD on 10/15/16 1046.   Attribution Key    CB.1 - Josie Dixon., MD on 10/15/16 1046  KG.1 -   Linda [name] on 10/05/16 1355  KT.1 - Liz Beach. on 10/15/16   1038  KT.2 - Liz Beach. on 10/15/16 1039  Revision History      Date/Time User Provider Type Action   > 10/15/16 1046   Josie Dixon., MD Physician Sign    10/15/16 [place]   (none) Sign at close encounter    10/05/16 [place], [place]   Assistant Sign at close encounter

## 2016-10-08 NOTE — Progress Notes (Signed)
OUTPATIENT PROGRESS NOTE  Date of Service:  10/02/2016  Address: [facility]  [facility]  [address]  [place] [address]  Dept: [phone]    Subjective:      Chief Complaint   Patient presents with   . Migraine      Patient ID: [id number]  Linda [name] is a 71 y.o. female    HPI70 yo female who did end up going to a headache specialist and as it turned out just stayed on the amitriptylline I started and has controlled symptoms with 75 mg a night.  She also stopped her beta blocker.  In addition she had the discovery of a parotid gland mass on the left found by the neurologist which she is scheduled to have removed by Dr Tomasa Blase at [facility].    Allergies   Allergen Reactions   . Actonel [Risedronate Sodium]      Osteonecrosis jaw documented     Current Outpatient Prescriptions   Medication Sig Dispense Refill   . amitriptyline (ELAVIL) 25 MG tablet One a night for PREVENTION of migraines 90 tablet 3   . ZOLMitriptan (ZOMIG) 5 MG tablet Take 1 tablet by mouth as needed for Migraine 6 tablet 3   . oxybutynin (DITROPAN-XL) 10 MG extended release tablet TAKE 1 TABLET BY MOUTH EVERY DAY 90 tablet 3   . Calcium Carbonate-Vitamin D (CALTRATE 600+D) 600-400 MG-UNIT CHEW Take 1 tablet by mouth 2 times daily. 60 tablet 11     No current facility-administered medications for this visit.      Past Medical History:   Diagnosis Date   . Bisphosphonate-associated osteonecrosis of the jaw (HCC)    . 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 Colon polyps     adenomatous    . Dyspepsia     nonulcerative   . High cholesterol    . History of depression    . 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 Migraine     recurrent, controlled with elavil   . Osteoarthritis of lumbar spine     Chunduri   . Postmenopausal    . Thoracic compression fracture (HCC)     T10 from a fall     Past Surgical History:   Procedure Laterality Date   . ARTERY  BIOPSY Right 12-24-12    Temporal Artery biopsy right    . COLONOSCOPY  [id number]    dr Paulina Fusi   3 years   . KYPHOSIS SURGERY      t10   . SHOULDER SURGERY      left humerus fx 3 places   . UPPER GASTROINTESTINAL ENDOSCOPY  08/26/2014    dr Onalee Hua hess:negative     Social History   Substance Use Topics   . Smoking status: Former Smoker     Quit date: 08/06/1997   . Smokeless tobacco: Never Used      Comment: passive exposure husband chain smokes   . Alcohol use 0.0 oz/week      Comment: rarely     Family History   Problem Relation Age of Onset   . Migraines Other    . Diabetes Other    . Breast Cancer Sister    . Cancer Mother    . Cancer Father           ROS    Objective:   Physical Exam  Assessment/Plan      1. Migraines better controlled  2. Parotid mass: new dx: excision soon        Ritta Slot, MD

## 2016-12-14 MED ORDER — BACLOFEN 10 MG PO TABS
10 MG | ORAL_TABLET | ORAL | 1 refills | Status: DC
Start: 2016-12-14 — End: 2017-05-23

## 2017-01-14 MED ORDER — METHYLPREDNISOLONE 4 MG PO TBPK
4 MG | PACK | ORAL | 0 refills | Status: AC
Start: 2017-01-14 — End: 2017-01-20

## 2017-01-14 NOTE — Telephone Encounter (Signed)
sent 

## 2017-01-14 NOTE — Telephone Encounter (Signed)
Pt has poison ivy on legs, arms and hands, she was working in her yard and now has an itchy rash, pt has been using otc anahistamine and calamine lotion and they are not working, she is asking if dr will call something in to her pharmacy, pt states this has been going on for 4 days, pt can be reached today at [phone], pt uses [facility] in [place] at [phone].

## 2017-01-14 NOTE — Telephone Encounter (Signed)
May have medrol dose pack #1 to use as directed. Avoid using benadry cream

## 2017-05-24 MED ORDER — BACLOFEN 10 MG PO TABS
10 | ORAL_TABLET | ORAL | 1 refills | Status: DC
Start: 2017-05-24 — End: 2017-06-10

## 2017-06-10 ENCOUNTER — Inpatient Hospital Stay
Admit: 2017-06-10 | Discharge: 2017-06-10 | Disposition: A | Discharge status: Home / Self Care | Payer: MEDICARE | Attending: Emergency Medicine

## 2017-06-10 DIAGNOSIS — G43009 Migraine without aura, not intractable, without status migrainosus: Secondary | ICD-10-CM

## 2017-06-10 MED ORDER — DIPHENHYDRAMINE HCL 50 MG/ML IJ SOLN
50 MG/ML | Freq: Once | INTRAMUSCULAR | Status: AC
Start: 2017-06-10 — End: 2017-06-10
  Administered 2017-06-10: 19:00:00 25 mg via INTRAVENOUS

## 2017-06-10 MED ORDER — PROCHLORPERAZINE EDISYLATE 5 MG/ML IJ SOLN
5 MG/ML | Freq: Once | INTRAMUSCULAR | Status: AC
Start: 2017-06-10 — End: 2017-06-10
  Administered 2017-06-10: 19:00:00 10 mg via INTRAVENOUS

## 2017-06-10 MED ORDER — SUMATRIPTAN SUCCINATE 6 MG/0.5ML SC SOLN
6 MG/0.5ML | Freq: Once | SUBCUTANEOUS | Status: AC
Start: 2017-06-10 — End: 2017-06-10
  Administered 2017-06-10: 20:00:00 6 mg via SUBCUTANEOUS

## 2017-06-10 MED ORDER — DEXAMETHASONE SODIUM PHOSPHATE 20 MG/5ML IJ SOLN
20 MG/5ML | Freq: Four times a day (QID) | INTRAMUSCULAR | Status: DC
Start: 2017-06-10 — End: 2017-06-10
  Administered 2017-06-10: 19:00:00 10 mg via INTRAVENOUS

## 2017-06-10 MED ORDER — PREDNISONE 5 MG PO TABS
5 MG | ORAL_TABLET | ORAL | 0 refills | Status: DC
Start: 2017-06-10 — End: 2017-06-18

## 2017-06-10 MED ORDER — MAGNESIUM SULFATE IN D5W 1-5 GM/100ML-% IV SOLN
1-5 GM/100ML-% | Freq: Once | INTRAVENOUS | Status: AC
Start: 2017-06-10 — End: 2017-06-10
  Administered 2017-06-10: 20:00:00 1 g via INTRAVENOUS

## 2017-06-10 MED ORDER — SUMATRIPTAN SUCCINATE 25 MG PO TABS
25 MG | ORAL_TABLET | Freq: Once | ORAL | 0 refills | Status: DC | PRN
Start: 2017-06-10 — End: 2017-07-08

## 2017-06-10 MED ORDER — KETOROLAC TROMETHAMINE 30 MG/ML IJ SOLN
30 MG/ML | Freq: Once | INTRAMUSCULAR | Status: AC
Start: 2017-06-10 — End: 2017-06-10
  Administered 2017-06-10: 19:00:00 15 mg via INTRAVENOUS

## 2017-06-10 MED ORDER — SODIUM CHLORIDE 0.9 % IV BOLUS
0.9 % | Freq: Once | INTRAVENOUS | Status: AC
Start: 2017-06-10 — End: 2017-06-10
  Administered 2017-06-10: 19:00:00 500 mL via INTRAVENOUS

## 2017-06-10 MED FILL — SODIUM CHLORIDE 0.9 % IV SOLN: 0.9 % | INTRAVENOUS | Qty: 500

## 2017-06-10 MED FILL — DIPHENHYDRAMINE HCL 50 MG/ML IJ SOLN: 50 MG/ML | INTRAMUSCULAR | Qty: 1

## 2017-06-10 MED FILL — DEXAMETHASONE SODIUM PHOSPHATE 20 MG/5ML IJ SOLN: 20 MG/5ML | INTRAMUSCULAR | Qty: 5

## 2017-06-10 MED FILL — MAGNESIUM SULFATE IN D5W 1-5 GM/100ML-% IV SOLN: 1-5 GM/100ML-% | INTRAVENOUS | Qty: 100

## 2017-06-10 NOTE — ED Notes (Signed)
Patient reports that headache is worse and fingers are red beet. Husband at bedside and is upset pacing in and out of the room . States "She's been here for over an hour ans she is still in pain" Explained that she already got medications bur husband states that is "B..S..." MD notified and is now in the room .     Lestine Mount, RN  06/10/17 [phone]

## 2017-06-10 NOTE — ED Provider Notes (Signed)
Date ofevaluation: 06/10/2017    Chief Complaint     Headache (mgraine headache onset 2.5 days ago.)      History of Present Illness     Linda [name] is a 71 y.o. female who presents with complaint of a headache for the last 2 and half days.  Patient has history of headaches and has been deemed to be migraine type.  She's had prior CTs and MRIs of her brain without any abnormalities.  Her headaches are typically bitemporal.  This headache is not been improved with her typical medications.  It sounds like she has taking medication in the past of [place] etc. without improvement.  Recently took amitriptyline which helped her headache that caused her to have restless leg syndrome prompting her not to take that again.  She denies any lateral weakness or numbness.  She has any difficulty speaking or swallowing.  She denies any history of facial asymmetry.  She's had no fever or chills of late.  She has no cough or chest congestion.  No sore throat or earache.  No abdominal pain nausea or vomiting.  No other complaints at this time.    Review of Systems     Review of Systems   Constitutional: Positive for activity change and appetite change. Negative for chills and fever.   HENT: Negative.    Eyes: Negative.    Respiratory: Negative.    Cardiovascular: Negative.  Negative for leg swelling.   Gastrointestinal: Negative.    Endocrine: Negative.    Genitourinary: Negative.    Skin: Negative.    Allergic/Immunologic: Negative.    Neurological: Positive for headaches. Negative for dizziness, tremors, facial asymmetry, speech difficulty, weakness and numbness.        A she suffers from migraine headaches that she states are bitemporal.  This headache that she's had now is not improved with her typical medication.  This is though her usual headache and it is not of any different location than usual.  She states she's had prior CTs and MRIs to exclude any other diagnoses.  She suffered from these type headaches for a  number of years now.   Hematological: Negative.    Psychiatric/Behavioral: Negative.        Past Medical, Surgical, Family, and SocialHistory     She has a past medical history of Bisphosphonate-associated osteonecrosis of the jaw ([place]); Celiac artery atherosclerosis; Colon polyps; Dyspepsia; High cholesterol; History of depression; Mass of parotid gland; Migraine; Osteoarthritis of lumbar spine; Postmenopausal; and Thoracic compression fracture ([place]).  She has a past surgical history that includes Kyphosis surgery; shoulder surgery; Artery Biopsy (Right, 12-24-12); Upper gastrointestinal endoscopy (08/26/2014); and Colonoscopy ([id number]).  Her family history includes Breast Cancer in her sister; Cancer in her father and mother; Diabetes in an other family member; Migraines in an other family member.  She reports that she quit smoking about 19 years ago. She has never used smokeless tobacco. She reports that she drinks alcohol. She reports that she does not use drugs.    Medications     Previous Medications    AMITRIPTYLINE (ELAVIL) 25 MG TABLET    One a night for PREVENTION of migraines    BUTALBITAL-ACETAMINOPHEN 50-300 MG CAPS    Take 1 capsule by mouth 3 times daily as needed    CALCIUM CARBONATE-VITAMIN D (CALTRATE 600+D) 600-400 MG-UNIT CHEW    Take 1 tablet by mouth 2 times daily.       Allergies     She is  allergic to actonel [risedronate sodium].    Physical Exam     INITIAL VITALS: BP: (!) 135/90, Temp: 97.7 F (36.5 C), Pulse: 73, Resp: 14, SpO2: 98 %   Physical Exam   Constitutional: She is oriented to person, place, and time. She appears well-developed and well-nourished. No distress.   HENT:   Head: Normocephalic.   Eyes: Pupils are equal, round, and reactive to light. EOM are normal.   Neck: Neck supple.   No nuchal rigidity no meningismus.   Cardiovascular: Normal rate, regular rhythm, normal heart sounds and intact distal pulses.    Pulmonary/Chest: Breath sounds normal. She has no rales.   Abdominal:  Soft. Bowel sounds are normal. She exhibits no distension. There is no tenderness. There is no guarding.   Musculoskeletal: She exhibits no edema.   Lymphadenopathy:     She has no cervical adenopathy.   Neurological: She is alert and oriented to person, place, and time. She displays normal reflexes. No cranial nerve deficit or sensory deficit. She exhibits normal muscle tone. Coordination normal.   Skin: Skin is warm and dry. She is not diaphoretic.   Psychiatric: She has a normal mood and affect.   Nursing note and vitals reviewed.      Diagnostic Results     EKG       RADIOLOGY:  No orders to display       LABS:   No results found for this visit on 06/10/17.    ED BEDSIDE ULTRASOUND:      RECENT VITALS:  BP: 130/77, Temp: 97.7 F (36.5 C), Pulse: 76, Resp: 17, SpO2: 97 %     Procedures         ED Course     Nursing Notes, Past Medical Hx, Past Surgical Hx, Social Hx, Allergies, and Family Hx were reviewed.    The patient was given the following medications:  Orders Placed This Encounter   Medications   . 0.9 % sodium chloride bolus   . prochlorperazine (COMPAZINE) injection 10 mg   . diphenhydrAMINE (BENADRYL) injection 25 mg   . ketorolac (TORADOL) injection 15 mg   . Dexamethasone Sodium Phosphate injection 10 mg   . magnesium sulfate 1 g in dextrose 5% 100 mL IVPB   . SUMAtriptan (IMITREX) injection 6 mg   . SUMAtriptan (IMITREX) 25 MG tablet     Sig: Take 1 tablet by mouth once as needed for Migraine May repeat this dose times one after 2 hours with a maximum of 100 mg per 24 hours which is equal to 4 tablets.     Dispense:  30 tablet     Refill:  0   . predniSONE (DELTASONE) 5 MG tablet     Sig: Take 12 tablets the first day followed by 11 tablets the second day followed by 10 tablets the third day and so on till no tablets left over.  This will be over the course of 12 days.     Dispense:  10 tablet     Refill:  0       CONSULTS:  IP CONSULT TO [place] / ASSESSMENT  / PLAN     Linda [name] is a 71 y.o. female who presents with her typical headache not relieved with her usual medication was seen and evaluated.  She was given IV fluids as well as Compazine and Benadryl and Toradol.  She was also given 10 mg Decadron to help  dwarf inflammation in the near future.  On reevaluation her headache is unchanged so she was given a gram of magnesium.  I then personally consulted her primary care physician who gives her a "shot" and he office that helps.  Patient's physician states that she gives her Imitrex.  My thought though is that this is to have days out and Imitrex doesn't typically help anymore chronic headache but she suggested I give it anyway.  Sounds like it has worked in the patient feels somewhat improved.  I was then asked to place the patient on prednisone taper.  Patient be discharged follow-up with her physician.  She be given Imitrex tablets to try for headache and she was given a prednisone taper as described to me by her PCP.        Clinical Impression     1. Migraine without aura and without status migrainosus, not intractable        Disposition     PATIENT REFERRED TO:  Angelica Pou, MD  [place] [address]  [phone]    Schedule an appointment as soon as possible for a visit         DISCHARGE MEDICATIONS:  New Prescriptions    PREDNISONE (DELTASONE) 5 MG TABLET    Take 12 tablets the first day followed by 11 tablets the second day followed by 10 tablets the third day and so on till no tablets left over.  This will be over the course of 12 days.    SUMATRIPTAN (IMITREX) 25 MG TABLET    Take 1 tablet by mouth once as needed for Migraine May repeat this dose times one after 2 hours with a maximum of 100 mg per 24 hours which is equal to 4 tablets.       DISPOSITION Decision To Discharge 06/10/2017 03:55:11 PM          [place], DO  06/10/17 1603

## 2017-06-10 NOTE — ED Notes (Signed)
Discharge and education instructions reviewed. Patient verbalized understanding, teach back successful. Patient denied questions at this time. Instructed to follow up with PCP and or return to ED if symptoms worsen.   Pain is gone     Lestine Mount, RN  06/10/17 [phone]

## 2017-06-12 ENCOUNTER — Encounter: Payer: MEDICARE | Attending: Internal Medicine | Primary: Internal Medicine

## 2017-06-18 ENCOUNTER — Ambulatory Visit: Admit: 2017-06-18 | Discharge: 2017-06-18 | Payer: MEDICARE | Attending: Internal Medicine | Primary: Internal Medicine

## 2017-06-18 ENCOUNTER — Encounter

## 2017-06-18 DIAGNOSIS — G43711 Chronic migraine without aura, intractable, with status migrainosus: Secondary | ICD-10-CM

## 2017-06-18 NOTE — Progress Notes (Signed)
OUTPATIENT PROGRESS NOTE  Date of Service:  06/18/2017  Address: [facility]  [place] [address]  Dept: [phone]  Loc: [phone]    Subjective:       PatientID: [id number]  Linda [name] is a 71 y.o. female  (Location/Symptom, Timing/Onset, Context/Setting, Quality, Duration, Modifying Factors, Severity)  HPIwith Migraine headaches, Itching all over,feels hot all the time like she is in menopause again . Her current headache has been nonstop for 8 days.  Pos photophobia and phonophobia.  No vomiting.    Also getting positional vertigo.  Off all the prohylactic meds amitriptylline has worked problem is getting RLS from elavil.    Has been having these since young used to take 3 aspirin tid  Has been on multiple prophylactic meds with no success.    Allergies   Allergen Reactions   . Actonel [Risedronate Sodium]      Osteonecrosis jaw documented     Current Outpatient Prescriptions   Medication Sig Dispense Refill   . Butalbital-Acetaminophen 50-300 MG CAPS Take 1 capsule by mouth 3 times daily as needed     . SUMAtriptan (IMITREX) 25 MG tablet Take 1 tablet by mouth once as needed for Migraine May repeat this dose times one after 2 hours with a maximum of 100 mg per 24 hours which is equal to 4 tablets. 30 tablet 0   . predniSONE (DELTASONE) 5 MG tablet Take 12 tablets the first day followed by 11 tablets the second day followed by 10 tablets the third day and so on till no tablets left over.  This will be over the course of 12 days. 10 tablet 0   . amitriptyline (ELAVIL) 25 MG tablet One a night for PREVENTION of migraines 90 tablet 3   . Calcium Carbonate-Vitamin D (CALTRATE 600+D) 600-400 MG-UNIT CHEW Take 1 tablet by mouth 2 times daily. 60 tablet 11     No current facility-administered medications for this visit.      Past Medical History:   Diagnosis Date   . Bisphosphonate-associated osteonecrosis of the jaw ([place])    . Celiac artery  atherosclerosis 2015    60% proximal stenosis   . Colon polyps     adenomatous    . Dyspepsia     nonulcerative   . High cholesterol    . History of depression    . 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 Migraine     recurrent, controlled with elavil   . Osteoarthritis of lumbar spine     Chunduri   . Postmenopausal    . Thoracic compression fracture ([place])     T10 from a fall     Past Surgical History:   Procedure Laterality Date   . ARTERY BIOPSY Right 12-24-12    Temporal Artery biopsy right    . COLONOSCOPY  [id number]    dr Awanda Mink   3 years   . KYPHOSIS SURGERY      t10   . SHOULDER SURGERY      left humerus fx 3 places   . UPPER GASTROINTESTINAL ENDOSCOPY  08/26/2014    dr Shanon Brow hess:negative     Social History   Substance Use Topics   . Smoking status: Current Some Day Smoker     Packs/day: 0.50     Years: 40.00     Last attempt to quit: 08/06/1997   . Smokeless tobacco:  Never Used      Comment: passive exposure husband chain smokes   . Alcohol use 0.0 oz/week      Comment: rarely     Family History   Problem Relation Age of Onset   . Migraines Other    . Diabetes Other    . Breast Cancer Sister    . Cancer Mother    . Cancer Father           Review of Systems   All other systems reviewed and are negative.  photophobia and phonophobia    Objective:   Physical Exam   Constitutional: She is oriented to person, place, and time. She appears well-developed and well-nourished. No distress.   Very pale   HENT:   Head: Normocephalic and atraumatic.   Right Ear: External ear normal.   Left Ear: External ear normal.   Nose: Nose normal.   Mouth/Throat: Oropharynx is clear and moist.   Eyes: Pupils are equal, round, and reactive to light. EOM are normal. Right eye exhibits no discharge. Left eye exhibits no discharge.   Neck: Normal range of motion. Neck supple. No JVD present. No tracheal deviation present. No thyromegaly present.   Cardiovascular: Normal rate, regular rhythm and normal heart sounds.   Exam reveals no gallop.    No murmur heard.  Pulmonary/Chest: Effort normal and breath sounds normal. No stridor. No respiratory distress.   Abdominal: Soft. Bowel sounds are normal. She exhibits no distension. There is no tenderness.   Musculoskeletal: Normal range of motion. She exhibits no edema.   Lymphadenopathy:     She has no cervical adenopathy.   Neurological: She is alert and oriented to person, place, and time. She has normal reflexes.   Skin: Skin is warm and dry. She is not diaphoretic.   Psychiatric: She has a normal mood and affect. Her behavior is normal. Judgment and thought content normal.   Nursing note and vitals reviewed.        Assessment/Plan   Greater than or equal 4 labs/xrays/tests reviewed and plans ordered  1. Need for influenza vaccination    - INFLUENZA, HIGH DOSE, 65 YRS +, IM, PF, PREFILL SYR, 0.5ML (FLUZONE HD)    2, Intractable headaches/migraines no aura  Plan : start Emgality   Given 240mg  SQ loading and instructed how to use it  Will need to start a shot a month    3. Pallor is very pale today, last labs done 1.5 years ago  Plan : recheck her labs today   4. Increased urinary odor  Check poct ua    Observed for 15 addition minutes after new shots  Spent 30 minutes with over half the time devoted to treatment discussion                Angelica Pou, MD

## 2017-06-19 ENCOUNTER — Encounter

## 2017-06-19 LAB — COMPREHENSIVE METABOLIC PANEL
ALT: 41 U/L — ABNORMAL HIGH (ref 10–40)
AST: 38 U/L — ABNORMAL HIGH (ref 15–37)
Albumin/Globulin Ratio: 1.5 (ref 1.1–2.2)
Albumin: 4.6 g/dL (ref 3.4–5.0)
Alkaline Phosphatase: 133 U/L — ABNORMAL HIGH (ref 40–129)
Anion Gap: 16 (ref 3–16)
BUN: 8 mg/dL (ref 7–20)
CO2: 24 mmol/L (ref 21–32)
Calcium: 10 mg/dL (ref 8.3–10.6)
Chloride: 98 mmol/L — ABNORMAL LOW (ref 99–110)
Creatinine: 0.6 mg/dL (ref 0.6–1.2)
GFR African American: >60 (ref >60)
GFR Non-African American: >60 (ref >60)
Globulin: 3.1 g/dL
Glucose: 105 mg/dL — ABNORMAL HIGH (ref 70–99)
Potassium: 4.1 mmol/L (ref 3.5–5.1)
Sodium: 138 mmol/L (ref 136–145)
Total Bilirubin: <0.2 mg/dL (ref 0.0–1.0)
Total Protein: 7.7 g/dL (ref 6.4–8.2)

## 2017-06-19 LAB — URINE DRUG SCREEN
Amphetamine Screen, Urine: NEGATIVE
Barbiturate Screen, Ur: POSITIVE — AB (ref <200)
Benzodiazepine Screen, Urine: NEGATIVE (ref <200)
Cannabinoid Scrn, Ur: POSITIVE — AB (ref <50)
Cocaine Metabolite Screen, Urine: NEGATIVE (ref <300)
Methadone Screen, Urine: NEGATIVE (ref <300)
Opiate Scrn, Ur: NEGATIVE (ref <300)
Oxycodone Urine: NEGATIVE (ref <100)
PCP Screen, Urine: NEGATIVE (ref <25)
Propoxyphene Scrn, Ur: NEGATIVE (ref <300)
pH, UA: 5

## 2017-06-19 LAB — VITAMIN B12: Vitamin B-12: 217 pg/mL (ref 211–911)

## 2017-06-19 LAB — CBC WITH AUTO DIFFERENTIAL
Basophils %: 0.5 %
Basophils Absolute: 0 10*3/uL (ref 0.0–0.2)
Eosinophils %: 0.9 %
Eosinophils Absolute: 0.1 10*3/uL (ref 0.0–0.6)
Hematocrit: 32.9 % — ABNORMAL LOW (ref 36.0–48.0)
Hemoglobin: 10 g/dL — ABNORMAL LOW (ref 12.0–16.0)
Lymphocytes %: 40.9 %
Lymphocytes Absolute: 2.9 10*3/uL (ref 1.0–5.1)
MCH: 23.3 pg — ABNORMAL LOW (ref 26.0–34.0)
MCHC: 30.3 g/dL — ABNORMAL LOW (ref 31.0–36.0)
MCV: 77 fL — ABNORMAL LOW (ref 80.0–100.0)
MPV: 8.1 fL (ref 5.0–10.5)
Monocytes %: 6.3 %
Monocytes Absolute: 0.4 10*3/uL (ref 0.0–1.3)
Neutrophils %: 51.4 %
Neutrophils Absolute: 3.6 10*3/uL (ref 1.7–7.7)
Platelets: 354 10*3/uL (ref 135–450)
RBC: 4.28 M/uL (ref 4.00–5.20)
RDW: 19.1 % — ABNORMAL HIGH (ref 12.4–15.4)
WBC: 7.1 10*3/uL (ref 4.0–11.0)

## 2017-06-19 LAB — VITAMIN D 25 HYDROXY: Vit D, 25-Hydroxy: 17 ng/mL — ABNORMAL LOW (ref >=30)

## 2017-06-19 LAB — FOLATE: Folate: 6.55 ng/mL (ref 4.78–24.20)

## 2017-06-19 LAB — TSH: TSH: 0.02 u[IU]/mL — ABNORMAL LOW (ref 0.27–4.20)

## 2017-06-19 MED ORDER — HYDROXYZINE HCL 25 MG PO TABS
25 MG | ORAL_TABLET | Freq: Four times a day (QID) | ORAL | 0 refills | Status: DC | PRN
Start: 2017-06-19 — End: 2018-10-27

## 2017-06-19 NOTE — Telephone Encounter (Signed)
Patient was in to see Dr. Garen Grams yesterday and forgot to ask Dr for a prescription for itching all over her body.  Patient said she discussed with Dr and forgot to ask for medication.  Patient does not want a topical she prefers a tablet    Linda [name] [phone]    [facility]  [address]  Ph [phone]  Fax [phone]

## 2017-06-19 NOTE — Telephone Encounter (Signed)
Sent to [facility]

## 2017-06-20 ENCOUNTER — Encounter

## 2017-06-20 LAB — T4, FREE: T4 Free: 1.6 ng/dL (ref 0.9–1.8)

## 2017-06-20 LAB — T3, FREE: T3, Free: 5.5 pg/mL — ABNORMAL HIGH (ref 2.3–4.2)

## 2017-06-20 LAB — IRON AND TIBC
Iron Saturation: 5 % — ABNORMAL LOW (ref 15–50)
Iron: 25 ug/dL — ABNORMAL LOW (ref 37–145)
TIBC: 480 ug/dL — ABNORMAL HIGH (ref 260–445)

## 2017-06-20 LAB — FERRITIN: Ferritin: 10.3 ng/mL — ABNORMAL LOW (ref 15.0–150.0)

## 2017-06-24 ENCOUNTER — Ambulatory Visit: Admit: 2017-06-24 | Discharge: 2017-06-24 | Payer: MEDICARE | Attending: Internal Medicine | Primary: Internal Medicine

## 2017-06-24 DIAGNOSIS — D500 Iron deficiency anemia secondary to blood loss (chronic): Secondary | ICD-10-CM

## 2017-06-24 LAB — POCT URINALYSIS DIPSTICK
Glucose, UA POC: NEGATIVE
Ketones, UA: NEGATIVE
Leukocytes, UA: NEGATIVE
Nitrite, UA: NEGATIVE
Protein, UA POC: 30
Spec Grav, UA: >=1.03
Urobilinogen, UA: 0.2
pH, UA: 5.5

## 2017-06-24 MED ORDER — CYANOCOBALAMIN 1000 MCG/ML IJ SOLN
1000 MCG/ML | Freq: Once | INTRAMUSCULAR | Status: AC
Start: 2017-06-24 — End: 2017-06-24
  Administered 2017-06-24: 18:00:00 1000 ug via INTRAMUSCULAR

## 2017-06-24 MED ORDER — CYANOCOBALAMIN 1000 MCG/ML IJ SOLN
1000 MCG/ML | Freq: Once | INTRAMUSCULAR | 0 refills | Status: DC
Start: 2017-06-24 — End: 2017-08-02

## 2017-06-24 MED ORDER — LANSOPRAZOLE 30 MG PO CPDR
30 MG | ORAL_CAPSULE | Freq: Every day | ORAL | 3 refills | Status: DC
Start: 2017-06-24 — End: 2017-06-24

## 2017-06-24 MED ORDER — B12 FOLATE 800-800 MCG PO CAPS
800-800 MCG | ORAL_CAPSULE | ORAL | 3 refills | Status: AC
Start: 2017-06-24 — End: ?

## 2017-06-24 MED ORDER — LANSOPRAZOLE 30 MG PO CPDR
30 MG | ORAL_CAPSULE | Freq: Every day | ORAL | 3 refills | Status: DC
Start: 2017-06-24 — End: 2017-07-08

## 2017-06-24 NOTE — Progress Notes (Signed)
OUTPATIENT PROGRESS NOTE  Date of Service:  06/24/2017  Address: [facility]  [place] [address]  Dept: [phone]  Loc: [phone]    Subjective:       PatientID: [id number]  Linda [name] is a 71 y.o. female  (Location/Symptom, Timing/Onset, Context/Setting, Quality, Duration, Modifying Factors, Severity)  HPIwith chronic migraine who was just started on shots to prevent migraines.  Still takes advil every day, takes prevacid sporadically.  She has had some black stool this mroning only.  No loose stools.  Feels very weak and tired. Passed out in the kitchen last week.  Also labs show low b12 and lowish folate, Also hyperthyroid with no prior history of thyroid disease.  Does not eat well.    Also has noticed increase sob doe.   Allergies   Allergen Reactions   . Actonel [Risedronate Sodium]      Osteonecrosis jaw documented   . Elavil [Amitriptyline Hcl]      Made her RLS worse     Current Outpatient Prescriptions   Medication Sig Dispense Refill   . hydrOXYzine (ATARAX) 25 MG tablet Take 1 tablet by mouth every 6 hours as needed for Itching 120 tablet 0   . Calcium Carbonate-Vitamin D (CALTRATE 600+D) 600-400 MG-UNIT CHEW Take 1 tablet by mouth 2 times daily. 60 tablet 11   . SUMAtriptan (IMITREX) 25 MG tablet Take 1 tablet by mouth once as needed for Migraine May repeat this dose times one after 2 hours with a maximum of 100 mg per 24 hours which is equal to 4 tablets. 30 tablet 0     No current facility-administered medications for this visit.      Past Medical History:   Diagnosis Date   . Bisphosphonate-associated osteonecrosis of the jaw ([place])    . 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 Colon polyps     adenomatous    . Dyspepsia     nonulcerative   . High cholesterol    . History of depression    . 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 Migraine     recurrent, controlled with  elavil   . Osteoarthritis of lumbar spine     Chunduri   . Postmenopausal    . Thoracic compression fracture ([place])     T10 from a fall     Past Surgical History:   Procedure Laterality Date   . ARTERY BIOPSY Right 12-24-12    Temporal Artery biopsy right    . COLONOSCOPY  [id number]    dr Awanda Mink   3 years   . KYPHOSIS SURGERY      t10   . SHOULDER SURGERY      left humerus fx 3 places   . UPPER GASTROINTESTINAL ENDOSCOPY  08/26/2014    dr Shanon Brow hess:negative     Social History   Substance Use Topics   . Smoking status: Current Some Day Smoker     Packs/day: 0.50     Years: 40.00     Last attempt to quit: 08/06/1997   . Smokeless tobacco: Never Used      Comment: passive exposure husband chain smokes   . Alcohol use 0.0 oz/week      Comment: rarely     Family History   Problem Relation Age of Onset   . Migraines Other    . Diabetes  Other    . Breast Cancer Sister    . Cancer Mother    . Cancer Father           Review of Systems   Gastrointestinal:        Dark stool   Neurological: Positive for light-headedness.   All other systems reviewed and are negative.      Objective:   Physical Exam   Constitutional: She is oriented to person, place, and time. She appears well-developed and well-nourished. No distress.   HENT:   Head: Normocephalic and atraumatic.   Right Ear: External ear normal.   Left Ear: External ear normal.   Nose: Nose normal.   Mouth/Throat: Oropharynx is clear and moist.   Eyes: Pupils are equal, round, and reactive to light. EOM are normal. Right eye exhibits no discharge. Left eye exhibits no discharge.   Neck: Normal range of motion. Neck supple. No JVD present. No tracheal deviation present. Thyromegaly present.   Possible enlarging right thyroid lobe   Cardiovascular: Normal rate, regular rhythm and normal heart sounds.  Exam reveals no gallop.    No murmur heard.  Pulmonary/Chest: Effort normal and breath sounds normal. No stridor. No respiratory distress.   Abdominal: Soft. Bowel sounds are normal. She  exhibits no distension. There is no tenderness.   Heme Pos dark stool   Musculoskeletal: Normal range of motion. She exhibits no edema.   Lymphadenopathy:     She has no cervical adenopathy.   Neurological: She is alert and oriented to person, place, and time. She has normal reflexes.   Skin: Skin is warm and dry. She is not diaphoretic.   Psychiatric: She has a normal mood and affect. Her behavior is normal. Judgment and thought content normal.   Nursing note and vitals reviewed.        Assessment/Plan   Greater than or equal 4 labs/xrays/tests reviewed and plans ordered  There are no diagnoses linked to this encounter.    1.  Iron deficiency anemia with heme pos stool  With microcytic anemia and chronic nsaid use  Plan : call Dr Awanda Mink and have her get in there asap    2. B12 deficiency and low normal folate  Plan Needs to be started on replacement of both  B12 shots  And start oral replacement    3. Hyperthyroid : new issue no prior history  Plan : recheck today  Nuclear scan of thyroid  Follow up on that               Angelica Pou, MD

## 2017-06-24 NOTE — Addendum Note (Signed)
Addended by: Benard Rink on: 06/24/2017 12:52 PM     Modules accepted: Orders

## 2017-06-25 LAB — URINALYSIS WITH MICROSCOPIC
Epithelial Cells, UA: 1 /HPF (ref 0–5)
Glucose, Ur: NEGATIVE mg/dL
Hyaline Casts, UA: 7 /LPF (ref 0–8)
Ketones, Urine: NEGATIVE mg/dL
Leukocyte Esterase, Urine: NEGATIVE
Nitrite, Urine: NEGATIVE
RBC, UA: 10 /HPF — ABNORMAL HIGH (ref 0–4)
Specific Gravity, UA: 1.024 (ref 1.005–1.030)
Urobilinogen, Urine: 0.2 E.U./dL (ref <2.0)
WBC, UA: 2 /HPF (ref 0–5)
pH, UA: 5.5 (ref 5.0–8.0)

## 2017-06-25 NOTE — Telephone Encounter (Signed)
Please call them and ask them to fill it or something like it

## 2017-06-25 NOTE — Telephone Encounter (Signed)
Mailbox is full

## 2017-06-25 NOTE — Telephone Encounter (Signed)
Pt states [facility] needs a phone call regarding med below. Pt states she does not know what she wants and [facility] is claiming they called [place], but we do not have any phone calls from them. They will not fill it without a call from our office    Cobalamine Combinations (B12 FOLATE) 800-800 MCG CAPS 30 capsule 3 06/24/2017     Sig: One a day indefinitely    Sent to pharmacy as: B12 Folate 800-800 MCG Oral Capsule      Pharmacy:  [facility] [place] [place] [address]

## 2017-06-25 NOTE — Telephone Encounter (Signed)
thae need a brand name to order or the generic

## 2017-06-25 NOTE — Telephone Encounter (Signed)
Please just ask Pauleen to buy the U88 and folic acid over the counter  Folate 420mcg a day  And B12 187mcg a day

## 2017-06-26 NOTE — Telephone Encounter (Signed)
Patient notified

## 2017-07-01 ENCOUNTER — Encounter

## 2017-07-01 MED ORDER — GALCANEZUMAB-GNLM 120 MG/ML SC SOAJ
120 MG/ML | PEN_INJECTOR | Freq: Every day | SUBCUTANEOUS | 0 refills | Status: DC
Start: 2017-07-01 — End: 2017-08-02

## 2017-07-01 NOTE — Telephone Encounter (Signed)
PA SUBMITTED VIA [facility] FOR Emgality 120MG /ML SC SOAJ   Key: [id number]   STATUS: PENDING

## 2017-07-02 ENCOUNTER — Inpatient Hospital Stay: Admit: 2017-07-02 | Payer: MEDICARE | Primary: Internal Medicine

## 2017-07-02 ENCOUNTER — Ambulatory Visit: Admit: 2017-07-02 | Discharge: 2017-07-02 | Payer: MEDICARE | Attending: Internal Medicine | Primary: Internal Medicine

## 2017-07-02 DIAGNOSIS — G43701 Chronic migraine without aura, not intractable, with status migrainosus: Secondary | ICD-10-CM

## 2017-07-02 DIAGNOSIS — E0590 Thyrotoxicosis, unspecified without thyrotoxic crisis or storm: Secondary | ICD-10-CM

## 2017-07-02 MED ORDER — PRAMIPEXOLE DIHYDROCHLORIDE 0.25 MG PO TABS
0.25 MG | ORAL_TABLET | Freq: Every evening | ORAL | 3 refills | Status: DC
Start: 2017-07-02 — End: 2017-07-02

## 2017-07-02 MED ORDER — PROMETHAZINE HCL 25 MG/ML IJ SOLN
25 MG/ML | Freq: Four times a day (QID) | INTRAMUSCULAR | Status: DC | PRN
Start: 2017-07-02 — End: 2017-07-08
  Administered 2017-07-02: 20:00:00 25 mg via INTRAMUSCULAR

## 2017-07-02 MED ORDER — I-123 CAPSULE (MICRO CURIE)
Freq: Once | Status: AC
Start: 2017-07-02 — End: 2017-07-02
  Administered 2017-07-02: 15:00:00 285 via ORAL

## 2017-07-02 MED ORDER — PAROXETINE HCL 20 MG PO TABS
20 MG | ORAL_TABLET | Freq: Every day | ORAL | 3 refills | Status: DC
Start: 2017-07-02 — End: 2017-07-02

## 2017-07-02 MED ORDER — SUMATRIPTAN SUCCINATE 6 MG/0.5ML SC SOLN
6 MG/0.5ML | Freq: Once | SUBCUTANEOUS | Status: AC
Start: 2017-07-02 — End: 2017-07-02
  Administered 2017-07-02: 20:00:00 6 mg via SUBCUTANEOUS

## 2017-07-02 MED ORDER — PAROXETINE HCL 20 MG PO TABS
20 MG | ORAL_TABLET | Freq: Every day | ORAL | 3 refills | Status: DC
Start: 2017-07-02 — End: 2017-09-04

## 2017-07-02 MED ORDER — SUMATRIPTAN SUCCINATE 6 MG/0.5ML SC SOLN
6 MG/0.5ML | Freq: Once | SUBCUTANEOUS | 3 refills | Status: DC | PRN
Start: 2017-07-02 — End: 2017-07-02

## 2017-07-02 MED ORDER — KETOROLAC TROMETHAMINE 60 MG/2ML IM SOLN
60 MG/2ML | Freq: Once | INTRAMUSCULAR | Status: AC
Start: 2017-07-02 — End: 2017-07-02
  Administered 2017-07-02: 20:00:00 60 mg via INTRAMUSCULAR

## 2017-07-02 MED ORDER — PRAMIPEXOLE DIHYDROCHLORIDE 0.25 MG PO TABS
0.25 MG | ORAL_TABLET | ORAL | 3 refills | Status: DC
Start: 2017-07-02 — End: 2017-07-08

## 2017-07-02 NOTE — Addendum Note (Signed)
Addended by: Marcello Fennel on: 07/02/2017 02:39 PM     Modules accepted: Orders

## 2017-07-02 NOTE — Progress Notes (Signed)
OUTPATIENT PROGRESS NOTE  Date of Service:  07/02/2017  Address: [facility]  [place] [address]  Dept: [phone]  Loc: [phone]    Subjective:     Chief Complaint   Patient presents with   . Migraine     PatientID: [id number]  Linda [name] is a 71 y.o. female  (Location/Symptom, Timing/Onset, Context/Setting, Quality, Duration, Modifying Factors, Severity)  HPIwith chronic daily migraines without aura who started Emgality last week but continues daily headache just as intensely.  Currently she hurts 3/10 which varies up to 8/10.  Not currently nauseated.  She takes advil and alleve for her headache. amitriptylline did help but she stopped.    Allergies   Allergen Reactions   . Actonel [Risedronate Sodium]      Osteonecrosis jaw documented   . Elavil [Amitriptyline Hcl]      Made her RLS worse     Current Outpatient Prescriptions   Medication Sig Dispense Refill   . Galcanezumab-gnlm (EMGALITY) 120 MG/ML SOAJ Inject 1 pen into the skin daily 1 pen 0   . cyanocobalamin 1000 MCG/ML injection Inject 1 mL into the muscle once for 1 dose 1 mL 0   . Cobalamine Combinations (B12 FOLATE) 800-800 MCG CAPS One a day indefinitely 30 capsule 3   . lansoprazole (PREVACID) 30 MG delayed release capsule TAKE 1 CAPSULE BY MOUTH DAILY 90 capsule 3   . hydrOXYzine (ATARAX) 25 MG tablet Take 1 tablet by mouth every 6 hours as needed for Itching 120 tablet 0   . SUMAtriptan (IMITREX) 25 MG tablet Take 1 tablet by mouth once as needed for Migraine May repeat this dose times one after 2 hours with a maximum of 100 mg per 24 hours which is equal to 4 tablets. 30 tablet 0   . lansoprazole (PREVACID) 30 MG capsule Take 1 capsule by mouth daily. 30 capsule 11   . Calcium Carbonate-Vitamin D (CALTRATE 600+D) 600-400 MG-UNIT CHEW Take 1 tablet by mouth 2 times daily. 60 tablet 11     No current facility-administered medications for this visit.      Past Medical  History:   Diagnosis Date   . 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 Colon polyps     adenomatous    . Dyspepsia     nonulcerative   . High cholesterol    . History of depression    . Iron deficiency anemia     has had gi work up [employer]   . 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 Migraine     recurrent, controlled with elavil   . Osteoarthritis of lumbar spine     [place]   . Postmenopausal    . Thoracic compression fracture ([place])     T10 from a fall     Past Surgical History:   Procedure Laterality Date   . ARTERY BIOPSY Right 12-24-12    Temporal Artery biopsy right    . COLONOSCOPY  [id number]    dr Awanda Mink   3 years   . KYPHOSIS SURGERY      t10   . SHOULDER SURGERY      left humerus fx 3 places   . UPPER GASTROINTESTINAL ENDOSCOPY  08/26/2014    dr Shanon Brow hess:negative     Social History  Substance Use Topics   . Smoking status: Current Some Day Smoker     Packs/day: 0.50     Years: 40.00     Last attempt to quit: 08/06/1997   . Smokeless tobacco: Never Used      Comment: passive exposure husband chain smokes   . Alcohol use 0.0 oz/week      Comment: rarely     Family History   Problem Relation Age of Onset   . Migraines Other    . Diabetes Other    . Breast Cancer Sister    . Cancer Mother    . Cancer Father           Review of Systems    Objective:   Physical Exam    Recent MRI reviewed  Assessment/Plan     There are no diagnoses linked to this encounter.    1.  Status Migraine:  Imitrex and phenergan shots    2. RLS:  Mirapex will start and she is to start the iron             Angelica Pou, MD

## 2017-07-03 ENCOUNTER — Encounter: Attending: Internal Medicine | Primary: Internal Medicine

## 2017-07-03 ENCOUNTER — Inpatient Hospital Stay: Admit: 2017-07-03 | Payer: MEDICARE | Primary: Internal Medicine

## 2017-07-03 NOTE — Telephone Encounter (Signed)
Dr. Garen Grams said proceed. She wants her to have the medication

## 2017-07-03 NOTE — Telephone Encounter (Signed)
Help   We need this

## 2017-07-03 NOTE — Telephone Encounter (Signed)
PA RESPONSE : Emgality 120MG /ML SC SOAJ   HAS BEEN DENIED UNDER MEDICARE PART D.  PLEASE SEE ATTACHED DENIAL LETTER

## 2017-07-04 NOTE — Telephone Encounter (Signed)
Patient missed a call from our office   She is wanting to know the results of her thyroid test from yesterday     [place] [phone]    Please Advise

## 2017-07-04 NOTE — Telephone Encounter (Signed)
Advised patient she has an overactive thyroid and needs to be seen to discuss treatment. Scheduled for tomorrow.

## 2017-07-05 ENCOUNTER — Ambulatory Visit: Admit: 2017-07-05 | Discharge: 2017-07-05 | Payer: MEDICARE | Attending: Internal Medicine | Primary: Internal Medicine

## 2017-07-05 ENCOUNTER — Encounter

## 2017-07-05 DIAGNOSIS — E0500 Thyrotoxicosis with diffuse goiter without thyrotoxic crisis or storm: Secondary | ICD-10-CM

## 2017-07-05 MED ORDER — NADOLOL 20 MG PO TABS
20 MG | ORAL_TABLET | Freq: Every day | ORAL | 3 refills | Status: DC
Start: 2017-07-05 — End: 2017-07-05

## 2017-07-05 MED ORDER — METHIMAZOLE 5 MG PO TABS
5 MG | ORAL_TABLET | Freq: Two times a day (BID) | ORAL | 3 refills | Status: DC
Start: 2017-07-05 — End: 2017-07-05

## 2017-07-05 NOTE — Telephone Encounter (Signed)
Dr. Garen Grams is reviewing chart for additional information and information on med from pharmacy.    Then she will decide if we should precede with auth.

## 2017-07-05 NOTE — Progress Notes (Signed)
OUTPATIENT PROGRESS NOTE  Date of Service:  07/05/2017  Address: [facility]  [place] [address]  Dept: [phone]  Loc: [phone]    Subjective:     Chief Complaint   Patient presents with   . Abnormal Test Results     PatientID: [id number]  Linda [name] is a 71 y.o. female  (Location/Symptom, Timing/Onset, Context/Setting, Quality, Duration, Modifying Factors, Severity)  HPIwith chronic daily headaches and now newly found graves who reports insomnia, RLS, DOE, Mind racing.    Allergies   Allergen Reactions   . Actonel [Risedronate Sodium]      Osteonecrosis jaw documented   . Elavil [Amitriptyline Hcl]      Made her RLS worse     Current Outpatient Prescriptions   Medication Sig Dispense Refill   . PARoxetine (PAXIL) 20 MG tablet TAKE 1 TABLET BY MOUTH DAILY 90 tablet 3   . pramipexole (MIRAPEX) 0.25 MG tablet TAKE 1 TABLET BY MOUTH EVERY EVENING 90 tablet 3   . Galcanezumab-gnlm (EMGALITY) 120 MG/ML SOAJ Inject 1 pen into the skin daily 1 pen 0   . cyanocobalamin 1000 MCG/ML injection Inject 1 mL into the muscle once for 1 dose 1 mL 0   . Cobalamine Combinations (B12 FOLATE) 800-800 MCG CAPS One a day indefinitely 30 capsule 3   . lansoprazole (PREVACID) 30 MG delayed release capsule TAKE 1 CAPSULE BY MOUTH DAILY 90 capsule 3   . hydrOXYzine (ATARAX) 25 MG tablet Take 1 tablet by mouth every 6 hours as needed for Itching 120 tablet 0   . SUMAtriptan (IMITREX) 25 MG tablet Take 1 tablet by mouth once as needed for Migraine May repeat this dose times one after 2 hours with a maximum of 100 mg per 24 hours which is equal to 4 tablets. 30 tablet 0   . lansoprazole (PREVACID) 30 MG capsule Take 1 capsule by mouth daily. 30 capsule 11   . Calcium Carbonate-Vitamin D (CALTRATE 600+D) 600-400 MG-UNIT CHEW Take 1 tablet by mouth 2 times daily. 60 tablet 11     Current Facility-Administered Medications   Medication Dose Route Frequency Provider  Last Rate Last Dose   . promethazine (PHENERGAN) injection 25 mg  25 mg Intramuscular Q6H PRN Angelica Pou, MD   25 mg at 07/02/17 1438     Past Medical History:   Diagnosis Date   . 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 Colon polyps     adenomatous    . Dyspepsia     nonulcerative   . High cholesterol    . History of depression    . Iron deficiency anemia     has had gi work up [employer]   . 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 Migraine     recurrent, controlled with elavil   . Osteoarthritis of lumbar spine     [place]   . Postmenopausal    . Thoracic compression fracture ([place])     T10 from a fall     Past Surgical History:   Procedure Laterality Date   . ARTERY BIOPSY Right 12-24-12    Temporal Artery biopsy right    . COLONOSCOPY  [id number]    dr Awanda Mink   3 years   . KYPHOSIS SURGERY      t10   .  SHOULDER SURGERY      left humerus fx 3 places   . UPPER GASTROINTESTINAL ENDOSCOPY  08/26/2014    dr Shanon Brow hess:negative     Social History   Substance Use Topics   . Smoking status: Current Some Day Smoker     Packs/day: 0.50     Years: 40.00     Last attempt to quit: 08/06/1997   . Smokeless tobacco: Never Used      Comment: passive exposure husband chain smokes   . Alcohol use 0.0 oz/week      Comment: rarely     Family History   Problem Relation Age of Onset   . Migraines Other    . Diabetes Other    . Breast Cancer Sister    . Cancer Mother    . Cancer Father           Review of Systems    Objective:   Physical Exam      Assessment/Plan       1. Hyperthyroid:  Graves new onset, discussed the tx options she wants to try Tapazole and see how it goes    Plan Tapazole 15mg  bid  2. Elevated bp and tachy with stairs  Start corguard 20mg      Angelica Pou, MD

## 2017-07-08 MED ORDER — NADOLOL 20 MG PO TABS
20 MG | ORAL_TABLET | ORAL | 3 refills | Status: DC
Start: 2017-07-08 — End: 2017-08-21

## 2017-07-08 MED ORDER — METHIMAZOLE 5 MG PO TABS
5 MG | ORAL_TABLET | ORAL | 3 refills | Status: DC
Start: 2017-07-08 — End: 2017-07-08

## 2017-07-08 NOTE — Progress Notes (Signed)
[place] time___0800_________        Surgery time__0900__________    Take the following medications with a sip of water:    Do not eat or drink anything after 12:00 midnight prior to your surgery.EXCEPT PREP  This includes water chewing gum, mints and ice chips.   You may brush your teeth and gargle the morning of your surgery, but do not swallow the water      You may be asked to stop blood thinners such as Coumadin, Plavix, Fragmin, Lovenox, etc., or any anti-inflammatories such as:  Aspirin, Ibuprofen, Advil, Naproxen prior to your surgery.    We also ask that you stop any OTC medications such as fish oil, vitamin E, glucosamine, garlic, Multivitamins, COQ 10, etc.    We ask that you do not smoke 24 hours prior to surgery  We ask that you do not  drink any alcoholic beverages 24 hours prior to surgery     You must make arrangements for a responsible adult to take you home after your surgery.    For your safety you will not be allowed to leave alone or drive yourself home.  Your surgery will be cancelled if you do not have a ride home.     Also for your safety, it is strongly suggested that someone stay with you the first 24 hours after your surgery.     A parent or legal guardian must accompany a child scheduled for surgery and plan to stay at the hospital until the child is discharged.    Please do not bring other children with you.    For your comfort, please wear simple loose fitting clothing to the hospital.  Please do not bring valuables.    Do not wear any make-up or nail polish on your fingers or toes      For your safety, please do not wear any jewelry or body piercing's on the day of surgery.   All jewelry must be removed.      If you have dentures, they will be removed before going to operating room.    For your convenience, we will provide you with a container.    If you wear contact lenses or glasses, they will be removed, please bring a case for  them.     If you have a living will and a durable power of attorney for healthcare, please bring in a copy.     As part of our patient safety program to minimize surgical site infections, we ask you to do the following:     Please notify your surgeon if you develop any illness between         now and the  day of your surgery.     This includes a cough, cold, fever, sore throat, nausea,         or vomiting, and diarrhea, etc.    Please notify your surgeon if you experience dizziness, shortness         of breath or blurred vision between now and the time of your surgery.       You may shower the night before surgery or the morning of   your surgery with an antibacterial soap.    You will need to bring a photo ID and insurance card    [facility] has an onsite pharmacy, would you like to utilize our pharmacy     If you will be staying overnight  and use a C-pap machine, please bring   your C-pap to hospital     Our goal is to provide you with excellent care, therefore, visitors will be limited to two(2) in the room at a time so that we may focus on providing this care for you.          Please contact pre-admission testing if you have any further questions.                 [place] phone number:  [phone]  Please note these are generalized instructions for all surgical cases, you may be provided with more specific instructions according to your surgery.

## 2017-07-08 NOTE — Anesthesia Pre-Procedure Evaluation (Signed)
Department of Anesthesiology  Preprocedure Note       Name:  Linda [name]   Age:  71 y.o.  DOB:  [birth year]                                          MRN:  [id number]         Date:  07/08/2017      Surgeon: Juliann Mule):  Orvan Seen, MD    Procedure: COLONOSCOPY DIAGNOSTIC (N/A )    Medications prior to admission:   Prior to Admission medications    Medication Sig Start Date End Date Taking? Authorizing Provider   nadolol (CORGARD) 20 MG tablet TAKE 1 TABLET BY MOUTH DAILY 07/07/17  Yes Angelica Pou, MD   PARoxetine (PAXIL) 20 MG tablet TAKE 1 TABLET BY MOUTH DAILY 07/02/17  Yes Angelica Pou, MD   cyanocobalamin 1000 MCG/ML injection Inject 1 mL into the muscle once for 1 dose 06/24/17 07/08/17 Yes Angelica Pou, MD   Cobalamine Combinations (B12 FOLATE) 800-800 MCG CAPS One a day indefinitely 06/24/17  Yes Angelica Pou, MD   hydrOXYzine (ATARAX) 25 MG tablet Take 1 tablet by mouth every 6 hours as needed for Itching 06/19/17 07/19/17 Yes Angelica Pou, MD   lansoprazole (PREVACID) 30 MG capsule Take 1 capsule by mouth daily. 01/16/10 06/24/18 Yes Angelica Pou, MD   Calcium Carbonate-Vitamin D (CALTRATE 600+D) 600-400 MG-UNIT CHEW Take 1 tablet by mouth 2 times daily. 01/16/10  Yes Angelica Pou, MD   Galcanezumab-gnlm [facility]) 120 MG/ML SOAJ Inject 1 pen into the skin daily 07/01/17   Angelica Pou, MD       Current medications:    No current facility-administered medications for this encounter.      Current Outpatient Prescriptions   Medication Sig Dispense Refill   . nadolol (CORGARD) 20 MG tablet TAKE 1 TABLET BY MOUTH DAILY 90 tablet 3   . PARoxetine (PAXIL) 20 MG tablet TAKE 1 TABLET BY MOUTH DAILY 90 tablet 3   . cyanocobalamin 1000 MCG/ML injection Inject 1 mL into the muscle once for 1 dose 1 mL 0   . Cobalamine Combinations (B12 FOLATE) 800-800 MCG CAPS One a day indefinitely 30 capsule 3   . hydrOXYzine (ATARAX) 25 MG tablet Take 1 tablet by mouth every 6 hours as needed for Itching 120 tablet 0   . lansoprazole (PREVACID)  30 MG capsule Take 1 capsule by mouth daily. 30 capsule 11   . Calcium Carbonate-Vitamin D (CALTRATE 600+D) 600-400 MG-UNIT CHEW Take 1 tablet by mouth 2 times daily. 60 tablet 11   . Galcanezumab-gnlm (EMGALITY) 120 MG/ML SOAJ Inject 1 pen into the skin daily 1 pen 0       Allergies:    Allergies   Allergen Reactions   . Actonel [Risedronate Sodium]      Osteonecrosis jaw documented       Problem List:    Patient Active Problem List   Diagnosis Code   . Hyperlipidemia E78.5   . 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 Menopausal and postmenopausal disorder N95.9   . GERD (gastroesophageal reflux disease) K21.9   . Migraine headache G43.909   . Colon polyps K63.5   . Depression F32.9   . Intractable headache R51   . Hypoxemia R09.02   . Osteoporosis M81.0   . Intractable chronic migraine without aura G43.719   . Medication overuse  headache G44.40       Past Medical History:        Diagnosis Date   . 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 Colon polyps     adenomatous    . Dyspepsia     nonulcerative   . Graves disease    . High cholesterol    . History of depression    . Hypertension    . Iron deficiency anemia     has had gi work up [employer]   . 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 Migraine     recurrent, controlled with elavil   . Osteoarthritis of lumbar spine     [place]   . Postmenopausal    . Thoracic compression fracture ([place])     T10 from a fall       Past Surgical History:        Procedure Laterality Date   . ARTERY BIOPSY Right 12-24-12    Temporal Artery biopsy right    . COLONOSCOPY  [id number]    dr Awanda Mink   3 years   . KYPHOSIS SURGERY      t10   . SHOULDER SURGERY      left humerus fx 3 places   . UPPER GASTROINTESTINAL ENDOSCOPY  08/26/2014    dr Shanon Brow hess:negative       Social History:    Social History   Substance Use Topics   . Smoking status: Current Some Day Smoker     Packs/day: 0.50     Years: 40.00      Last attempt to quit: 08/06/1997   . Smokeless tobacco: Never Used      Comment: passive exposure husband chain smokes   . Alcohol use 0.0 oz/week      Comment: rarely                                Ready to quit: Not Answered  Counseling given: Not Answered      Vital Signs (Current):   Vitals:    07/08/17 1540   Weight: 133 lb (60.3 kg)   Height: 5\' 4"  (1.626 m)                                              BP Readings from Last 3 Encounters:   07/05/17 (!) 150/90   07/02/17 134/66   06/24/17 122/70       NPO Status:                                                                                 BMI:   Wt Readings from Last 3 Encounters:   07/08/17 133 lb (60.3 kg)   07/05/17 133 lb (60.3 kg)   07/02/17 136 lb (61.7 kg)     Body mass index is 22.83 kg/m.    CBC:  Lab Results   Component Value Date    WBC 7.1 06/18/2017    RBC 4.28 06/18/2017    HGB 10.0 06/18/2017    HCT 32.9 06/18/2017    MCV 77.0 06/18/2017    RDW 19.1 06/18/2017    PLT 354 06/18/2017       CMP:   Lab Results   Component Value Date    NA 138 06/18/2017    K 4.1 06/18/2017    CL 98 06/18/2017    CO2 24 06/18/2017    BUN 8 06/18/2017    CREATININE 0.6 06/18/2017    GFRAA >60 06/18/2017    GFRAA >60 05/12/2010    AGRATIO 1.5 06/18/2017    LABGLOM >60 06/18/2017    GLUCOSE 105 06/18/2017    PROT 7.7 06/18/2017    PROT 7.8 05/12/2010    CALCIUM 10.0 06/18/2017    BILITOT <0.2 06/18/2017    ALKPHOS 133 06/18/2017    AST 38 06/18/2017    ALT 41 06/18/2017       POC Tests: No results for input(s): POCGLU, POCNA, POCK, POCCL, POCBUN, POCHEMO, POCHCT in the last 72 hours.    Coags:   Lab Results   Component Value Date    PROTIME 11.7 12/23/2012    INR 1.05 12/23/2012    APTT 34.5 12/23/2012       HCG (If Applicable): No results found for: PREGTESTUR, PREGSERUM, HCG, HCGQUANT     ABGs:   Lab Results   Component Value Date    PHART 7.409 12/23/2012    PO2ART 112.0 12/23/2012    PCO2ART 34.8 12/23/2012    HCO3ART 21.6 12/23/2012    BEART -2.0 12/23/2012     O2SATART 98.4 12/23/2012        Type & Screen (If Applicable):  No results found for: LABABO, [place]    Anesthesia Evaluation  Patient summary reviewed and Nursing notes reviewed no history of anesthetic complications:   Airway: Mallampati: II  TM distance: >3 FB   Neck ROM: full  Mouth opening: > = 3 FB Dental:          Pulmonary:Negative Pulmonary ROS                              Cardiovascular:    (+) hypertension:,                   Neuro/Psych:   (+) headaches:, psychiatric history:            GI/Hepatic/Renal:   (+) GERD:,           Endo/Other:    (+) hyperthyroidism::., .                 Abdominal:           Vascular:                                        Anesthesia Plan      general     ASA 5     (71 year old female presents for colonoscopy.  Plan general anesthesia with ASA standard monitors.  Questions answered.  Patient agreeable with anesthetic plan.  )  Induction: intravenous.      Anesthetic plan and risks discussed with patient.      Plan discussed with CRNA.    Attending anesthesiologist reviewed  and agrees with Pre Eval content              Windy Kalata, MD   07/08/2017

## 2017-07-09 ENCOUNTER — Ambulatory Visit: Admit: 2017-07-09 | Primary: Internal Medicine

## 2017-07-09 ENCOUNTER — Inpatient Hospital Stay: Payer: MEDICARE

## 2017-07-09 MED ORDER — LIDOCAINE HCL (PF) 2 % IJ SOLN
2 | INTRAMUSCULAR | Status: AC
Start: 2017-07-09 — End: 2017-07-09

## 2017-07-09 MED ORDER — METOCLOPRAMIDE HCL 5 MG/ML IJ SOLN
5 MG/ML | Freq: Once | INTRAMUSCULAR | Status: DC | PRN
Start: 2017-07-09 — End: 2017-07-09

## 2017-07-09 MED ORDER — HYDROMORPHONE 0.5MG/0.5ML IJ SOLN
1 MG/ML | Status: DC | PRN
Start: 2017-07-09 — End: 2017-07-09

## 2017-07-09 MED ORDER — HYDRALAZINE HCL 20 MG/ML IJ SOLN
20 MG/ML | INTRAMUSCULAR | Status: DC | PRN
Start: 2017-07-09 — End: 2017-07-09

## 2017-07-09 MED ORDER — DIPHENHYDRAMINE HCL 50 MG/ML IJ SOLN
50 MG/ML | Freq: Once | INTRAMUSCULAR | Status: DC | PRN
Start: 2017-07-09 — End: 2017-07-09

## 2017-07-09 MED ORDER — SODIUM CHLORIDE 0.9 % IV SOLN
0.9 % | INTRAVENOUS | Status: DC
Start: 2017-07-09 — End: 2017-07-09
  Administered 2017-07-09: 14:00:00 via INTRAVENOUS

## 2017-07-09 MED ORDER — NORMAL SALINE FLUSH 0.9 % IV SOLN
0.9 % | Freq: Two times a day (BID) | INTRAVENOUS | Status: DC
Start: 2017-07-09 — End: 2017-07-09

## 2017-07-09 MED ORDER — OXYCODONE HCL 5 MG PO TABS
5 MG | ORAL | Status: DC | PRN
Start: 2017-07-09 — End: 2017-07-09

## 2017-07-09 MED ORDER — PROPOFOL 200 MG/20ML IV EMUL
200 MG/20ML | INTRAVENOUS | Status: DC | PRN
Start: 2017-07-09 — End: 2017-07-09
  Administered 2017-07-09: 14:00:00 40 via INTRAVENOUS
  Administered 2017-07-09: 14:00:00 80 via INTRAVENOUS
  Administered 2017-07-09: 15:00:00 50 via INTRAVENOUS
  Administered 2017-07-09 (×2): 40 via INTRAVENOUS
  Administered 2017-07-09: 14:00:00 50 via INTRAVENOUS

## 2017-07-09 MED ORDER — NORMAL SALINE FLUSH 0.9 % IV SOLN
0.9 % | INTRAVENOUS | Status: DC | PRN
Start: 2017-07-09 — End: 2017-07-09

## 2017-07-09 MED ORDER — LABETALOL HCL 5 MG/ML IV SOLN
5 MG/ML | INTRAVENOUS | Status: DC | PRN
Start: 2017-07-09 — End: 2017-07-09

## 2017-07-09 MED ORDER — LIDOCAINE HCL 2 % IJ SOLN
2 % | INTRAMUSCULAR | Status: DC | PRN
Start: 2017-07-09 — End: 2017-07-09
  Administered 2017-07-09: 14:00:00 40 via INTRAVENOUS
  Administered 2017-07-09 (×3): 20 via INTRAVENOUS

## 2017-07-09 MED ORDER — PROPOFOL 200 MG/20ML IV EMUL
200 | INTRAVENOUS | Status: AC
Start: 2017-07-09 — End: 2017-07-09

## 2017-07-09 MED ORDER — MEPERIDINE HCL 25 MG/ML IJ SOLN
25 MG/ML | INTRAMUSCULAR | Status: DC | PRN
Start: 2017-07-09 — End: 2017-07-09

## 2017-07-09 MED ORDER — MORPHINE SULFATE (PF) 4 MG/ML IV SOLN
4 MG/ML | INTRAVENOUS | Status: DC | PRN
Start: 2017-07-09 — End: 2017-07-09

## 2017-07-09 MED ORDER — PROMETHAZINE HCL 25 MG/ML IJ SOLN
25 MG/ML | Freq: Once | INTRAMUSCULAR | Status: DC | PRN
Start: 2017-07-09 — End: 2017-07-09

## 2017-07-09 MED ORDER — LIDOCAINE HCL (PF) 1 % IJ SOLN
1 % | Freq: Once | INTRAMUSCULAR | Status: DC | PRN
Start: 2017-07-09 — End: 2017-07-09

## 2017-07-09 MED FILL — XYLOCAINE-MPF 2 % IJ SOLN: 2 % | INTRAMUSCULAR | Qty: 5

## 2017-07-09 MED FILL — DIPRIVAN 200 MG/20ML IV EMUL: 200 MG/20ML | INTRAVENOUS | Qty: 20

## 2017-07-09 NOTE — Discharge Instructions (Signed)
[facility] Discharge Instructions  Colonoscopy    NAME:  Linda [name]  DATE OF BIRTH:  12-18-1945  MEDICAL RECORD NUMBER:  [id number]  DATE:  07/08/2017    . After receiving Propofol (Diprivan) for Moderate Sedation:    o Do not drive or operate any machinery until tomorrow  o Do not sign any legal documents or make any critical decisions  o Do not drink alcoholic beverages for 24 hours  o Plan to spend a few hours resting before resuming your normal routine  o Possible side effects are light headedness and sedation    . You may resume your usual diet at home    . Resume all your daily medications    . Call your physician if any of the following occur:    o Severe abdominal distention and/or pain.  (Mild distention or cramping is normal after this procedure; this should improve within an hour or two with passage of air)  o Fever, chills, nausea or vomiting  o You may notice a small amount of blood in your next few bowel movements    . If excessive bleeding occurs:  Call your physician immediately or proceed to the nearest Emergency Room    . Biopsy Obtained:     []    YES      If you have not heard from your physician in one week please call your physician's office for biopsy results, [phone].     []  NO      Recommendations: Repeat colonoscopy in 5 years         For questions or concerns please contact your GI physician's 24 hour call center at [phone].

## 2017-07-09 NOTE — Procedures (Signed)
[facility]  Endoscopy Note    Patient: Linda [name]  DOB: Jan 15, 1946  Acct#: [id number]    Procedure: Colonoscopy with biopsy, polypectomy (snare cautery)    Date:  07/09/2017    Surgeon:  Orvan Seen, MD    Referring Physician:  Garen Grams    Previous Colonoscopy: Yes  Date: 09/28/14  Greater than 3 years? No    Preoperative Diagnosis:  Positive hemoccult    Postoperative Diagnosis:  Colon polyps    Anesthesia:  See anesthesia note    Indications: This is a 71 y.o. year old female who presents today with previous adenomatous polyp.    Procedure:   An informed consent was obtained from the patient after explanation of indications, benefits, possible risks and complications of the procedure.  The patient was then taken to the endoscopy suite, placed in the left lateral decubitus position, and the above IV anesthesia was administered.    A digital rectal examination was performed and revealed negative without mass, lesions or tenderness.      The Olympus CFQ-180-AL video colonoscope was placed in the patient's rectum under digital direction and advanced to the cecum. The cecum was identified by characteristic anatomy and ballottment.  The ileocecal valve was identified.    The preparation was good.    The scope was then withdrawn back through the cecum, ascending, transverse, descending and sigmoid colons.  Carefull circumferential examination of the mucosa in these areas demonstrated a 5 mm polyp in the descending colon that was snared but not retrieved. There was a 1 mm polyp in the ascending colon that was biopsied and removed..   The scope was then withdrawn into the rectum and retroflexed.  The retroflexed view of the anal verge and rectum demonstrates no abnormalities.  The scope was straightened, the colon was decompressed and the scope was withdrawn from the patient.  The patient tolerated the procedure well and was taken to the PACU in good condition.    Estimated Blood Loss:  none    Impression:  Colon  polyps    Recommendations:  Await pathology.  Repeat colonoscopy in 5 years.    Orvan Seen, MD    GI  07/09/2017

## 2017-07-09 NOTE — Anesthesia Post-Procedure Evaluation (Signed)
Department of Anesthesiology  Postprocedure Note    Patient: Linda [name]  MRN: [id number]  Birthdate: 1946/05/19  Date of evaluation: 07/09/2017  Time:  9:43 AM     Procedure Summary     Date:  07/09/17 Room / Location:  [facility] END[facility] / [facility]    Anesthesia Start:  0902 Anesthesia Stop:  0940    Procedure:  COLONOSCOPY POLYPECTOMY HOT BIOPSY (N/A ) Diagnosis:       Rectal bleeding      (RECTAL BLEEDING)    Surgeon:  Orvan Seen, MD Responsible Provider:  Windy Kalata, MD    Anesthesia Type:  general ASA Status:  2          Anesthesia Type: general    Aldrete Phase I: Aldrete Score: 10    Aldrete Phase II: Aldrete Score: 9    Last vitals: Reviewed and per EMR flowsheets.       Anesthesia Post Evaluation    Patient location during evaluation: PACU  Patient participation: complete - patient participated  Level of consciousness: awake and alert  Pain score: 0  Airway patency: patent  Nausea & Vomiting: no nausea and no vomiting  Complications: no  Cardiovascular status: hemodynamically stable  Respiratory status: acceptable  Hydration status: euvolemic

## 2017-07-09 NOTE — H&P (Signed)
[facility]   Pre-operative History and Physical    Patient: Linda [name]  DOB: 1945-11-06  Acct#: [id number]    History Obtained From: electronic medical record    HISTORY OF PRESENT ILLNESS  Procedure:Colonoscopy  Indications:surveillance  Past Medical History:        Diagnosis Date   . 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 Colon polyps     adenomatous    . Dyspepsia     nonulcerative   . Graves disease    . High cholesterol    . History of depression    . Hypertension    . Iron deficiency anemia     has had gi work up [employer]   . 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 Migraine     recurrent, controlled with elavil   . Osteoarthritis of lumbar spine     Chunduri   . Postmenopausal    . Thoracic compression fracture ([place])     T10 from a fall     Past Surgical History:        Procedure Laterality Date   . ARTERY BIOPSY Right 12-24-12    Temporal Artery biopsy right    . COLONOSCOPY  [id number]    dr Awanda Mink   3 years   . KYPHOSIS SURGERY      t10   . SHOULDER SURGERY      left humerus fx 3 places   . UPPER GASTROINTESTINAL ENDOSCOPY  08/26/2014    dr Shanon Brow Yoona Ishii:negative     Medications Prior to Admission:   No current facility-administered medications on file prior to encounter.      Current Outpatient Prescriptions on File Prior to Encounter   Medication Sig Dispense Refill   . nadolol (CORGARD) 20 MG tablet TAKE 1 TABLET BY MOUTH DAILY 90 tablet 3   . PARoxetine (PAXIL) 20 MG tablet TAKE 1 TABLET BY MOUTH DAILY 90 tablet 3   . cyanocobalamin 1000 MCG/ML injection Inject 1 mL into the muscle once for 1 dose 1 mL 0   . Cobalamine Combinations (B12 FOLATE) 800-800 MCG CAPS One a day indefinitely 30 capsule 3   . hydrOXYzine (ATARAX) 25 MG tablet Take 1 tablet by mouth every 6 hours as needed for Itching 120 tablet 0   . lansoprazole (PREVACID) 30 MG capsule Take 1 capsule by mouth daily. 30 capsule 11    . Calcium Carbonate-Vitamin D (CALTRATE 600+D) 600-400 MG-UNIT CHEW Take 1 tablet by mouth 2 times daily. 60 tablet 11   . Galcanezumab-gnlm (EMGALITY) 120 MG/ML SOAJ Inject 1 pen into the skin daily 1 pen 0     Allergies:  Actonel [risedronate sodium]  Social History     Social History   . Marital status: Married     Spouse name: N/A   . Number of children: N/A   . Years of education: N/A     Occupational History   . Not on file.     Social History Main Topics   . Smoking status: Current Some Day Smoker     Packs/day: 0.50     Years: 40.00     Last attempt to quit: 08/06/1997   . Smokeless tobacco: Never Used      Comment: passive exposure husband chain smokes   . Alcohol use 0.0 oz/week      Comment: rarely   .  Drug use: No   . Sexual activity: Not Currently     Other Topics Concern   . Not on file     Social History Narrative   . No narrative on file     Family History   Problem Relation Age of Onset   . Migraines Other    . Diabetes Other    . Breast Cancer Sister    . Cancer Mother    . Cancer Father          PHYSICAL EXAM:      BP (!) 140/64   Pulse 74   Temp 97.9 F (36.6 C) (Temporal)   Resp 18   Ht 5\' 4"  (1.626 m)   Wt 128 lb 8 oz (58.3 kg)   SpO2 100%   BMI 22.06 kg/m  I        Heart:normal    Lungs: normal    Abdomen: normal      ASA Grade:  See anesthesia note      ASSESSMENT AND PLAN:    1.  Procedure options, risks and benefits reviewed with patient and expresses understanding.

## 2017-07-16 ENCOUNTER — Encounter: Attending: Internal Medicine | Primary: Internal Medicine

## 2017-07-23 MED ORDER — DOXEPIN HCL 25 MG PO CAPS
25 MG | ORAL_CAPSULE | Freq: Every evening | ORAL | 3 refills | Status: DC
Start: 2017-07-23 — End: 2017-07-23

## 2017-07-23 MED ORDER — DIVALPROEX SODIUM ER 500 MG PO TB24
500 MG | ORAL_TABLET | ORAL | 2 refills | Status: DC
Start: 2017-07-23 — End: 2017-07-23

## 2017-07-23 MED ORDER — ROPINIROLE HCL 1 MG PO TABS
1 MG | ORAL_TABLET | Freq: Every evening | ORAL | 3 refills | Status: DC
Start: 2017-07-23 — End: 2017-07-23

## 2017-07-23 NOTE — Telephone Encounter (Signed)
Insurance wont approve until she tries timolol and depakote    Will call in timolol   She should come back in 1-2 weeks to see me    Also has itching will try doxepin

## 2017-07-23 NOTE — Telephone Encounter (Signed)
Patient is trying to get approval for her insurance to cover   Injection for her migrainesMigraines are coming back  What progress is office making to get the injection approved  Patient is going to need something else for the migraines   If injection cannot be done soon    [name] [phone]    Please Advise

## 2017-07-24 MED ORDER — DOXEPIN HCL 25 MG PO CAPS
25 MG | ORAL_CAPSULE | ORAL | 3 refills | Status: DC
Start: 2017-07-24 — End: 2017-09-17

## 2017-07-24 MED ORDER — ROPINIROLE HCL 1 MG PO TABS
1 MG | ORAL_TABLET | ORAL | 3 refills | Status: DC
Start: 2017-07-24 — End: 2017-08-28

## 2017-07-24 MED ORDER — DIVALPROEX SODIUM ER 500 MG PO TB24
500 MG | ORAL_TABLET | ORAL | 2 refills | Status: DC
Start: 2017-07-24 — End: 2017-08-21

## 2017-08-02 ENCOUNTER — Ambulatory Visit: Admit: 2017-08-02 | Discharge: 2017-08-02 | Payer: MEDICARE | Attending: Internal Medicine | Primary: Internal Medicine

## 2017-08-02 DIAGNOSIS — R3129 Other microscopic hematuria: Secondary | ICD-10-CM

## 2017-08-02 MED ORDER — POLYSACCHARIDE IRON COMPLEX 391.3 (180 FE) MG PO CAPS
391.3 (180 Fe) MG | ORAL_CAPSULE | ORAL | 5 refills | Status: AC
Start: 2017-08-02 — End: ?

## 2017-08-02 NOTE — Telephone Encounter (Signed)
Dr. Awanda Mink is on vacation until next week

## 2017-08-02 NOTE — Progress Notes (Signed)
OUTPATIENT PROGRESS NOTE  Date of Service:  08/02/2017  Address: [facility]  [place] [address]  Dept: [phone]  Loc: [phone]    Subjective:     Chief Complaint   Patient presents with   . Migraine     anemia and hyperthyroid     PatientID: [id number]  Linda [name] is a 71 y.o. female  (Location/Symptom, Timing/Onset, Context/Setting, Quality, Duration, Modifying Factors, Severity)  HPI38 yo wf with multiple daily headaches, new onset anemia of iron def and hyperthyroidism who is here for follow up.  She has stopped getting headaches since starting depakote 500mg  xr No weight gain or loss.      Did get colonoscopy but iron def not addressed.  Rest of labs were addressed  Allergies   Allergen Reactions   . Actonel [Risedronate Sodium]      Osteonecrosis jaw documented     Current Outpatient Prescriptions   Medication Sig Dispense Refill   . divalproex (DEPAKOTE ER) 500 MG extended release tablet TAKE 1 TABLET BY MOUTH AT NIGHT FOR MIGRAINE PREVENTION IN ADDITION TO NADOLOL 90 tablet 2   . doxepin (SINEQUAN) 25 MG capsule TAKE 1 CAPSULE BY MOUTH EVERY EVENING FOR ITCHING INSTED OF HYDROXYZINE 90 capsule 3   . rOPINIRole (REQUIP) 1 MG tablet TAKE 1 TABLET BY MOUTH EVERY EVENING 90 tablet 3   . nadolol (CORGARD) 20 MG tablet TAKE 1 TABLET BY MOUTH DAILY 90 tablet 3   . PARoxetine (PAXIL) 20 MG tablet TAKE 1 TABLET BY MOUTH DAILY 90 tablet 3   . Cobalamine Combinations (B12 FOLATE) 800-800 MCG CAPS One a day indefinitely 30 capsule 3   . lansoprazole (PREVACID) 30 MG capsule Take 1 capsule by mouth daily. 30 capsule 11   . Calcium Carbonate-Vitamin D (CALTRATE 600+D) 600-400 MG-UNIT CHEW Take 1 tablet by mouth 2 times daily. 60 tablet 11     No current facility-administered medications for this visit.      Past Medical History:   Diagnosis Date   . 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 Celiac artery atherosclerosis  2015    60% proximal stenosis   . Colon polyps     adenomatous    . Dyspepsia     nonulcerative   . Graves disease    . High cholesterol    . History of depression    . Hypertension    . Iron deficiency anemia     has had gi work up [employer]   . 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 Migraine     recurrent, controlled with elavil   . Osteoarthritis of lumbar spine     Chunduri   . Postmenopausal    . Thoracic compression fracture ([place])     T10 from a fall     Past Surgical History:   Procedure Laterality Date   . ARTERY BIOPSY Right 12-24-12    Temporal Artery biopsy right    . COLONOSCOPY  [id number]    dr Awanda Mink   3 years   . COLONOSCOPY N/A 07/09/2017    COLONOSCOPY POLYPECTOMY SNARE performed by Orvan Seen, MD at [place]   . COLONOSCOPY N/A 07/09/2017    COLONOSCOPY WITH BIOPSY performed by Orvan Seen, MD at [facility]   . KYPHOSIS SURGERY      t10   . SHOULDER SURGERY  left humerus fx 3 places   . UPPER GASTROINTESTINAL ENDOSCOPY  08/26/2014    dr Shanon Brow [place]:negative     Social History   Substance Use Topics   . Smoking status: Current Some Day Smoker     Packs/day: 0.50     Years: 40.00     Last attempt to quit: 08/06/1997   . Smokeless tobacco: Never Used      Comment: passive exposure husband chain smokes   . Alcohol use 0.0 oz/week      Comment: rarely     Family History   Problem Relation Age of Onset   . Migraines Other    . Diabetes Other    . Breast Cancer Sister    . Cancer Mother    . Cancer Father           Review of Systems  Dry mouth tolerating meds  Objective:   Physical Exam   Constitutional: She is oriented to person, place, and time. She appears well-developed and well-nourished.   pale   HENT:   Head: Normocephalic and atraumatic.   Cardiovascular: Normal rate, regular rhythm and normal heart sounds.    Pulmonary/Chest: Effort normal and breath sounds normal.   Abdominal: Soft. There is no tenderness.   Musculoskeletal: She exhibits no edema.    Neurological: She is alert and oriented to person, place, and time.         Assessment/Plan       1.  HYperthyroid  Due for recheck on free T4 and T3  Also cbc cmp for adr's  2. Multifactorial anemia:  May be dietary ,eats very little  Plan :  Discuss with Dr Awanda Mink ? EGD and ? Camera  3. Daily migraines finally responsive  Stay on current meds       Angelica Pou, MD

## 2017-08-03 LAB — CBC WITH AUTO DIFFERENTIAL
Basophils %: 1.2 %
Basophils Absolute: 0.1 10*3/uL (ref 0.0–0.2)
Eosinophils %: 3.9 %
Eosinophils Absolute: 0.3 10*3/uL (ref 0.0–0.6)
Hematocrit: 24.6 % — ABNORMAL LOW (ref 36.0–48.0)
Hemoglobin: 7.4 g/dL — ABNORMAL LOW (ref 12.0–16.0)
Lymphocytes %: 25.7 %
Lymphocytes Absolute: 1.9 10*3/uL (ref 1.0–5.1)
MCH: 21.5 pg — ABNORMAL LOW (ref 26.0–34.0)
MCHC: 30.1 g/dL — ABNORMAL LOW (ref 31.0–36.0)
MCV: 71.5 fL — ABNORMAL LOW (ref 80.0–100.0)
MPV: 8.1 fL (ref 5.0–10.5)
Monocytes %: 4.3 %
Monocytes Absolute: 0.3 10*3/uL (ref 0.0–1.3)
Neutrophils %: 64.9 %
Neutrophils Absolute: 4.9 10*3/uL (ref 1.7–7.7)
Platelets: 352 10*3/uL (ref 135–450)
RBC: 3.44 M/uL — ABNORMAL LOW (ref 4.00–5.20)
RDW: 19.7 % — ABNORMAL HIGH (ref 12.4–15.4)
WBC: 7.6 10*3/uL (ref 4.0–11.0)

## 2017-08-03 LAB — URINALYSIS
Bilirubin Urine: NEGATIVE
Blood, Urine: NEGATIVE
Glucose, Ur: NEGATIVE mg/dL
Ketones, Urine: NEGATIVE mg/dL
Leukocyte Esterase, Urine: NEGATIVE
Nitrite, Urine: NEGATIVE
Protein, UA: NEGATIVE mg/dL
Specific Gravity, UA: 1.019 (ref 1.005–1.030)
Urobilinogen, Urine: 0.2 E.U./dL (ref <2.0)
pH, UA: 6 (ref 5.0–8.0)

## 2017-08-03 LAB — COMPREHENSIVE METABOLIC PANEL
ALT: 11 U/L (ref 10–40)
AST: 17 U/L (ref 15–37)
Albumin/Globulin Ratio: 1.2 (ref 1.1–2.2)
Albumin: 4.1 g/dL (ref 3.4–5.0)
Alkaline Phosphatase: 150 U/L — ABNORMAL HIGH (ref 40–129)
Anion Gap: 17 — ABNORMAL HIGH (ref 3–16)
BUN: 8 mg/dL (ref 7–20)
CO2: 23 mmol/L (ref 21–32)
Calcium: 9.2 mg/dL (ref 8.3–10.6)
Chloride: 99 mmol/L (ref 99–110)
Creatinine: 0.8 mg/dL (ref 0.6–1.2)
GFR African American: >60 (ref >60)
GFR Non-African American: >60 (ref >60)
Globulin: 3.3 g/dL
Glucose: 93 mg/dL (ref 70–99)
Potassium: 4.4 mmol/L (ref 3.5–5.1)
Sodium: 139 mmol/L (ref 136–145)
Total Bilirubin: 0.3 mg/dL (ref 0.0–1.0)
Total Protein: 7.4 g/dL (ref 6.4–8.2)

## 2017-08-03 LAB — T3, FREE: T3, Free: 2.1 pg/mL — ABNORMAL LOW (ref 2.3–4.2)

## 2017-08-03 LAB — T4, FREE: T4 Free: 0.6 ng/dL — ABNORMAL LOW (ref 0.9–1.8)

## 2017-08-04 LAB — CULTURE, URINE: Urine Culture, Routine: NO GROWTH

## 2017-08-05 ENCOUNTER — Encounter

## 2017-08-05 LAB — IRON AND TIBC
Iron Saturation: 15 % (ref 15–50)
Iron: 71 ug/dL (ref 37–145)
TIBC: 486 ug/dL — ABNORMAL HIGH (ref 260–445)

## 2017-08-05 MED ORDER — OMEPRAZOLE 40 MG PO CPDR
40 MG | ORAL_CAPSULE | Freq: Every day | ORAL | 3 refills | Status: DC
Start: 2017-08-05 — End: 2017-08-05

## 2017-08-05 MED ORDER — OMEPRAZOLE 40 MG PO CPDR
40 MG | ORAL_CAPSULE | Freq: Every day | ORAL | 3 refills | Status: DC
Start: 2017-08-05 — End: 2017-08-07

## 2017-08-05 NOTE — Telephone Encounter (Signed)
lmom and was given back line number

## 2017-08-05 NOTE — Telephone Encounter (Signed)
Pt returning call about labs      [phone]

## 2017-08-05 NOTE — Telephone Encounter (Signed)
Spoke with patient instructed to go to [facility] for stat labs.

## 2017-08-06 LAB — CBC WITH AUTO DIFFERENTIAL
Basophils %: 0.7 %
Basophils Absolute: 0 10*3/uL (ref 0.0–0.2)
Eosinophils %: 5.3 %
Eosinophils Absolute: 0.3 10*3/uL (ref 0.0–0.6)
Hematocrit: 24.5 % — ABNORMAL LOW (ref 36.0–48.0)
Hemoglobin: 7.6 g/dL — ABNORMAL LOW (ref 12.0–16.0)
Lymphocytes %: 38.7 %
Lymphocytes Absolute: 2.2 10*3/uL (ref 1.0–5.1)
MCH: 21.3 pg — ABNORMAL LOW (ref 26.0–34.0)
MCHC: 30.8 g/dL — ABNORMAL LOW (ref 31.0–36.0)
MCV: 69.3 fL — ABNORMAL LOW (ref 80.0–100.0)
MPV: 7.4 fL (ref 5.0–10.5)
Monocytes %: 6.1 %
Monocytes Absolute: 0.4 10*3/uL (ref 0.0–1.3)
Neutrophils %: 49.2 %
Neutrophils Absolute: 2.8 10*3/uL (ref 1.7–7.7)
Platelets: 351 10*3/uL (ref 135–450)
RBC: 3.54 M/uL — ABNORMAL LOW (ref 4.00–5.20)
RDW: 19.7 % — ABNORMAL HIGH (ref 12.4–15.4)
WBC: 5.8 10*3/uL (ref 4.0–11.0)

## 2017-08-06 LAB — FERRITIN: Ferritin: 4.8 ng/mL — ABNORMAL LOW (ref 15.0–150.0)

## 2017-08-07 LAB — PERIPHERAL BLOOD SMEAR, PATH REVIEW

## 2017-08-07 NOTE — Telephone Encounter (Signed)
Patient notified that infusion will be ordered. Ok to go to the dentist today and

## 2017-08-07 NOTE — Telephone Encounter (Signed)
Pt states she spoke with Dr Garen Grams nurse Monday and was told to come in today by Dr Garen Grams. Per connie dr sax Is reviewing pts info and will call pt back    [phone]

## 2017-08-07 NOTE — Progress Notes (Signed)
[place] time___0930_________        Surgery time_1030___________    Take the following medications with a sip of water:    Do not eat or drink anything after 12:00 midnight prior to your surgery.  This includes water chewing gum, mints and ice chips.   You may brush your teeth and gargle the morning of your surgery, but do not swallow the water      You may be asked to stop blood thinners such as Coumadin, Plavix, Fragmin, Lovenox, etc., or any anti-inflammatories such as:  Aspirin, Ibuprofen, Advil, Naproxen prior to your surgery.    We also ask that you stop any OTC medications such as fish oil, vitamin E, glucosamine, garlic, Multivitamins, COQ 10, etc.    We ask that you do not smoke 24 hours prior to surgery  We ask that you do not  drink any alcoholic beverages 24 hours prior to surgery     You must make arrangements for a responsible adult to take you home after your surgery.    For your safety you will not be allowed to leave alone or drive yourself home.  Your surgery will be cancelled if you do not have a ride home.     Also for your safety, it is strongly suggested that someone stay with you the first 24 hours after your surgery.     A parent or legal guardian must accompany a child scheduled for surgery and plan to stay at the hospital until the child is discharged.    Please do not bring other children with you.    For your comfort, please wear simple loose fitting clothing to the hospital.  Please do not bring valuables.    Do not wear any make-up or nail polish on your fingers or toes      For your safety, please do not wear any jewelry or body piercing's on the day of surgery.   All jewelry must be removed.      If you have dentures, they will be removed before going to operating room.    For your convenience, we will provide you with a container.    If you wear contact lenses or glasses, they will be removed, please bring a case for them.     If  you have a living will and a durable power of attorney for healthcare, please bring in a copy.     As part of our patient safety program to minimize surgical site infections, we ask you to do the following:     Please notify your surgeon if you develop any illness between         now and the  day of your surgery.     This includes a cough, cold, fever, sore throat, nausea,         or vomiting, and diarrhea, etc.    Please notify your surgeon if you experience dizziness, shortness         of breath or blurred vision between now and the time of your surgery.       You may shower the night before surgery or the morning of   your surgery with an antibacterial soap.    You will need to bring a photo ID and insurance card    [facility] has an onsite pharmacy, would you like to utilize our pharmacy     If you will be staying overnight and  use a C-pap machine, please bring   your C-pap to hospital     Our goal is to provide you with excellent care, therefore, visitors will be limited to two(2) in the room at a time so that we may focus on providing this care for you.          Please contact pre-admission testing if you have any further questions.                 [facility] phone number:  [phone]  Please note these are generalized instructions for all surgical cases, you may be provided with more specific instructions according to your surgery.

## 2017-08-08 ENCOUNTER — Inpatient Hospital Stay: Payer: MEDICARE

## 2017-08-08 ENCOUNTER — Ambulatory Visit: Admit: 2017-08-08 | Primary: Internal Medicine

## 2017-08-08 MED ORDER — LABETALOL HCL 5 MG/ML IV SOLN
5 MG/ML | INTRAVENOUS | Status: DC | PRN
Start: 2017-08-08 — End: 2017-08-08

## 2017-08-08 MED ORDER — PROPOFOL 200 MG/20ML IV EMUL
200 MG/20ML | INTRAVENOUS | Status: DC | PRN
Start: 2017-08-08 — End: 2017-08-08
  Administered 2017-08-08: 15:00:00 100 via INTRAVENOUS
  Administered 2017-08-08: 16:00:00 150 via INTRAVENOUS

## 2017-08-08 MED ORDER — PROPOFOL 200 MG/20ML IV EMUL
200 | INTRAVENOUS | Status: AC
Start: 2017-08-08 — End: 2017-08-08

## 2017-08-08 MED ORDER — PROMETHAZINE HCL 25 MG/ML IJ SOLN
25 MG/ML | Freq: Once | INTRAMUSCULAR | Status: DC | PRN
Start: 2017-08-08 — End: 2017-08-08

## 2017-08-08 MED ORDER — LIDOCAINE HCL (PF) 2 % IJ SOLN
2 | INTRAMUSCULAR | Status: AC
Start: 2017-08-08 — End: 2017-08-08

## 2017-08-08 MED ORDER — LIDOCAINE HCL 2 % IJ SOLN
2 % | INTRAMUSCULAR | Status: DC | PRN
Start: 2017-08-08 — End: 2017-08-08
  Administered 2017-08-08: 15:00:00 100 via INTRAVENOUS

## 2017-08-08 MED ORDER — SODIUM CHLORIDE 0.9 % IV SOLN
0.9 % | INTRAVENOUS | Status: DC
Start: 2017-08-08 — End: 2017-08-08
  Administered 2017-08-08: 15:00:00 25 mL/h via INTRAVENOUS

## 2017-08-08 MED ORDER — HYDROMORPHONE 0.5MG/0.5ML IJ SOLN
1 MG/ML | Status: DC | PRN
Start: 2017-08-08 — End: 2017-08-08

## 2017-08-08 MED ORDER — OXYCODONE HCL 5 MG PO TABS
5 MG | ORAL | Status: DC | PRN
Start: 2017-08-08 — End: 2017-08-08

## 2017-08-08 MED ORDER — HYDRALAZINE HCL 20 MG/ML IJ SOLN
20 MG/ML | INTRAMUSCULAR | Status: DC | PRN
Start: 2017-08-08 — End: 2017-08-08

## 2017-08-08 MED ORDER — DIPHENHYDRAMINE HCL 50 MG/ML IJ SOLN
50 MG/ML | Freq: Once | INTRAMUSCULAR | Status: DC | PRN
Start: 2017-08-08 — End: 2017-08-08

## 2017-08-08 MED ORDER — MEPERIDINE HCL 25 MG/ML IJ SOLN
25 MG/ML | INTRAMUSCULAR | Status: DC | PRN
Start: 2017-08-08 — End: 2017-08-08

## 2017-08-08 MED ORDER — MORPHINE SULFATE (PF) 4 MG/ML IV SOLN
4 MG/ML | INTRAVENOUS | Status: DC | PRN
Start: 2017-08-08 — End: 2017-08-08

## 2017-08-08 MED ORDER — METOCLOPRAMIDE HCL 5 MG/ML IJ SOLN
5 MG/ML | Freq: Once | INTRAMUSCULAR | Status: DC | PRN
Start: 2017-08-08 — End: 2017-08-08

## 2017-08-08 MED FILL — DIPRIVAN 200 MG/20ML IV EMUL: 200 MG/20ML | INTRAVENOUS | Qty: 20

## 2017-08-08 MED FILL — XYLOCAINE-MPF 2 % IJ SOLN: 2 % | INTRAMUSCULAR | Qty: 5

## 2017-08-08 NOTE — Discharge Instructions (Signed)
[facility] Discharge Instructions   EGD (Esophagogastroduodenoscopy)    NAME:  Linda [name]  DATE OF BIRTH:  02/21/1946  MEDICAL RECORD NUMBER:  [id number]  DATE:  08/07/2017    . After receiving Propofol (Diprivan) for Moderate Sedation:    o Do not drive or operate any machinery until tomorrow  o Do not sign any legal documents or make any critical decisions  o Do not drink alcoholic beverages for 24 hours  o Plan to spend a few hours resting before resuming your normal routine  o Possible side effects are light headedness and sedation    . You may resume your usual diet at home    . Resume all your daily medications    . Call your physician if any of the following occur:    o If you have any difficulty breathing: CALL 911  o Neck swelling  o Difficulty swallowing  o Excessive pain or abdominal distention  o Fever, chills, nausea or vomiting    . If you are unable to reach your physician or symptoms worsen, proceed to the nearest Emergency Room    . You may use warm salt water gargles, lozenges or Chloraseptic spray as needed for your sore throat.    Biopsy Obtained:No     []    YES      If you have not heard from your physician in one week please call your physician's office for biopsy results, [phone].     [x]  NO    Recommendations: UGI and referral to Dr Dallas Schimke  For questions or concerns please contact your GI physician's 24 hour call center at [phone].

## 2017-08-08 NOTE — H&P (Signed)
[facility]   Pre-operative History and Physical    Patient: Linda [name]  DOB: [birth year]  Acct#: [id number]    History Obtained From: electronic medical record    HISTORY OF PRESENT ILLNESS  Procedure:EGD  Indications:anemia  Past Medical History:        Diagnosis Date   . 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 Colon polyps     adenomatous    . Dyspepsia     nonulcerative   . Graves disease    . High cholesterol    . History of depression    . Hypertension    . Iron deficiency anemia     has had gi work up [employer]   . 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 Migraine     recurrent, controlled with elavil   . Osteoarthritis of lumbar spine     Chunduri   . Postmenopausal    . Thoracic compression fracture ([place])     T10 from a fall     Past Surgical History:        Procedure Laterality Date   . ARTERY BIOPSY Right 12-24-12    Temporal Artery biopsy right    . COLONOSCOPY  [id number]    dr Awanda Mink   3 years   . COLONOSCOPY N/A 07/09/2017    COLONOSCOPY POLYPECTOMY SNARE performed by Orvan Seen, MD at [place]   . COLONOSCOPY N/A 07/09/2017    COLONOSCOPY WITH BIOPSY performed by Orvan Seen, MD at [place]   . KYPHOSIS SURGERY      t10   . SHOULDER SURGERY      left humerus fx 3 places   . UPPER GASTROINTESTINAL ENDOSCOPY  08/26/2014    dr Shanon Brow [place]:negative     Medications Prior to Admission:   No current facility-administered medications on file prior to encounter.      Current Outpatient Prescriptions on File Prior to Encounter   Medication Sig Dispense Refill   . Polysaccharide Iron Complex 391.3 (180 Fe) MG CAPS One a day for six months 30 capsule 5   . divalproex (DEPAKOTE ER) 500 MG extended release tablet TAKE 1 TABLET BY MOUTH AT NIGHT FOR MIGRAINE PREVENTION IN ADDITION TO NADOLOL 90 tablet 2   . doxepin (SINEQUAN) 25 MG capsule TAKE 1 CAPSULE BY MOUTH EVERY EVENING FOR  ITCHING INSTED OF HYDROXYZINE 90 capsule 3   . rOPINIRole (REQUIP) 1 MG tablet TAKE 1 TABLET BY MOUTH EVERY EVENING 90 tablet 3   . nadolol (CORGARD) 20 MG tablet TAKE 1 TABLET BY MOUTH DAILY 90 tablet 3   . PARoxetine (PAXIL) 20 MG tablet TAKE 1 TABLET BY MOUTH DAILY 90 tablet 3   . Cobalamine Combinations (B12 FOLATE) 800-800 MCG CAPS One a day indefinitely 30 capsule 3   . lansoprazole (PREVACID) 30 MG capsule Take 1 capsule by mouth daily. 30 capsule 11   . Calcium Carbonate-Vitamin D (CALTRATE 600+D) 600-400 MG-UNIT CHEW Take 1 tablet by mouth 2 times daily. 60 tablet 11     Allergies:  Actonel [risedronate sodium]  Social History     Social History   . Marital status: Married     Spouse name: N/A   . Number of children: N/A   . Years of education: N/A     Occupational History   . Not  on file.     Social History Main Topics   . Smoking status: Current Some Day Smoker     Packs/day: 0.50     Years: 40.00     Last attempt to quit: 08/06/1997   . Smokeless tobacco: Never Used      Comment: passive exposure husband chain smokes   . Alcohol use 0.0 oz/week      Comment: rarely   . Drug use: No   . Sexual activity: Not Currently     Other Topics Concern   . Not on file     Social History Narrative   . No narrative on file     Family History   Problem Relation Age of Onset   . Migraines Other    . Diabetes Other    . Breast Cancer Sister    . Cancer Mother    . Cancer Father          PHYSICAL EXAM:      BP (!) 128/50   Pulse 94   Temp 97.2 F (36.2 C) (Temporal)   Resp 18   Ht 5\' 4"  (1.626 m)   Wt 134 lb 2 oz (60.8 kg)   SpO2 100%   BMI 23.02 kg/m  I        Heart:normal    Lungs: normal    Abdomen: normal      ASA Grade:  See anesthesia note      ASSESSMENT AND PLAN:    1.  Procedure options, risks and benefits reviewed with patient and expresses understanding.

## 2017-08-08 NOTE — Procedures (Signed)
[facility]  Endoscopy Note    Patient: Linda [name]  DOB: 1946-03-15  Acct#: [id number]    Procedure: Esophagogastroduodenoscopy     Date:  08/08/2017     Surgeon:  Orvan Seen, MD    Referring Physician:  Garen Grams    Preoperative Diagnosis:  anemia    Postoperative Diagnosis:  Large hiatal hernia with Lysbeth Galas ulcers and esophagitis    Anesthesia: see anesthesia note.    Indications: This is a 72 y.o. year old female who presents today with Unexplained iron deficiency anaemia.      Description of Procedure:  Informed consent was obtained from the patient after explanation of indications, benefits and possible risks and complications of the procedure.  The patient was then taken to the endoscopy suite, placed in the left lateral decubitus position and the above IV sedation was administrered.    The Olympus videoendoscope was placed in the patient's mouth and under direct visualization passed into the esophagus.  Visualization of the esophagus demonstrated esophagitis with blood at the EGJ..     The scope was then advanced into the stomach.    Visualization of the gastric body and antrum demonstrated a large hiatal hernia..  A retroflexed exam of the gastric cardia and fundus demonstrated the hernia with Cameron ulcers..  The pylorus was patent and the scope was unable to be introduced into the duodenum due to the large hernia.    The scope was then withdrawn back into the stomach, it was decompressed, and the scope was completely withdrawn.    The patient tolerated the procedure well and was taken to the post anesthesia care unit in good condition.    Estimated Blood loss:  none    Impression: Large hernia with Cameron ulcers and blood at the EGJ.      Recommendations:UGI and Celiac panel and referral to Dr Dallas Schimke.    Orvan Seen, MD  [facility]

## 2017-08-08 NOTE — Anesthesia Post-Procedure Evaluation (Signed)
Department of Anesthesiology  Postprocedure Note    Patient: Linda [name]  MRN: [id number]  Birthdate: [birth year]  Date of evaluation: 08/08/2017  Time:  10:48 AM     Procedure Summary     Date:  08/08/17 Room / Location:  [facility] / [facility]    Anesthesia Start:  1022 Anesthesia Stop:      Procedure:  EGD ESOPHAGOGASTRODUODENOSCOPY (N/A ) Diagnosis:       Anemia, unspecified type      (ANEMIA)    Surgeon:  Orvan Seen, MD Responsible Provider:  Windy Kalata, MD    Anesthesia Type:  general ASA Status:  3          Anesthesia Type: general    Aldrete Phase I: Aldrete Score: 10    Aldrete Phase II: Aldrete Score: 7    Last vitals: Reviewed and per EMR flowsheets.       Anesthesia Post Evaluation    Patient location during evaluation: PACU  Patient participation: complete - patient participated  Level of consciousness: awake and alert  Pain score: 0  Airway patency: patent  Nausea & Vomiting: no nausea and no vomiting  Complications: no  Cardiovascular status: hemodynamically stable  Respiratory status: acceptable  Hydration status: euvolemic

## 2017-08-08 NOTE — Progress Notes (Signed)
Dr. Awanda Mink here to speak with pt. And her husband.  Discharge instructions discussed.  Understanding verbalized.

## 2017-08-08 NOTE — Anesthesia Pre-Procedure Evaluation (Signed)
Department of Anesthesiology  Preprocedure Note       Name:  Linda [name]   Age:  72 y.o.  DOB:  [birth year]                                          MRN:  [id number]         Date:  08/08/2017      Surgeon: Juliann Mule):  Orvan Seen, MD    Procedure: EGD DIAGNOSTIC ONLY (N/A )    Medications prior to admission:   Prior to Admission medications    Medication Sig Start Date End Date Taking? Authorizing Provider   methimazole (TAPAZOLE) 5 MG tablet Take 5 mg by mouth 2 times daily   Yes Historical Provider, MD   Polysaccharide Iron Complex 391.3 (180 Fe) MG CAPS One a day for six months 08/02/17  Yes Angelica Pou, MD   divalproex (DEPAKOTE ER) 500 MG extended release tablet TAKE 1 TABLET BY MOUTH AT NIGHT FOR MIGRAINE PREVENTION IN ADDITION TO NADOLOL 07/23/17  Yes Angelica Pou, MD   doxepin (SINEQUAN) 25 MG capsule TAKE 1 CAPSULE BY MOUTH EVERY EVENING FOR ITCHING INSTED OF HYDROXYZINE 07/23/17  Yes Angelica Pou, MD   rOPINIRole (REQUIP) 1 MG tablet TAKE 1 TABLET BY MOUTH EVERY EVENING 07/23/17  Yes Angelica Pou, MD   nadolol (CORGARD) 20 MG tablet TAKE 1 TABLET BY MOUTH DAILY 07/07/17  Yes Angelica Pou, MD   PARoxetine (PAXIL) 20 MG tablet TAKE 1 TABLET BY MOUTH DAILY 07/02/17  Yes Angelica Pou, MD   Cobalamine Combinations (B12 FOLATE) 800-800 MCG CAPS One a day indefinitely 06/24/17  Yes Angelica Pou, MD   lansoprazole (PREVACID) 30 MG capsule Take 1 capsule by mouth daily. 01/16/10 06/24/18 Yes Angelica Pou, MD   Calcium Carbonate-Vitamin D (CALTRATE 600+D) 600-400 MG-UNIT CHEW Take 1 tablet by mouth 2 times daily. 01/16/10  Yes Angelica Pou, MD       Current medications:    Current Facility-Administered Medications   Medication Dose Route Frequency Provider Last Rate Last Dose   . 0.9 % sodium chloride infusion  25 mL/hr Intravenous Continuous Ashima Dhamija, MD 25 mL/hr at 08/08/17 1006 25 mL/hr at 08/08/17 1006   . HYDROmorphone (DILAUDID) injection 0.25 mg  0.25 mg Intravenous Q5 Min PRN Windy Kalata, MD       . HYDROmorphone  (DILAUDID) injection 0.5 mg  0.5 mg Intravenous Q5 Min PRN Windy Kalata, MD       . morphine (PF) injection 1 mg  1 mg Intravenous Q5 Min PRN Windy Kalata, MD       . HYDROmorphone (DILAUDID) injection 0.5 mg  0.5 mg Intravenous Q5 Min PRN Windy Kalata, MD       . oxyCODONE (ROXICODONE) immediate release tablet 5 mg  5 mg Oral PRN Windy Kalata, MD        Or   . oxyCODONE (ROXICODONE) immediate release tablet 10 mg  10 mg Oral PRN Windy Kalata, MD       . diphenhydrAMINE (BENADRYL) injection 12.5 mg  12.5 mg Intravenous Once PRN Windy Kalata, MD       . metoclopramide (REGLAN) injection 10 mg  10 mg Intravenous Once PRN Windy Kalata, MD       . promethazine (PHENERGAN) injection 6.25 mg  6.25 mg Intravenous Once PRN Elyn Aquas  Chana Bode, MD       . labetalol (NORMODYNE;TRANDATE) injection 5 mg  5 mg Intravenous Q10 Min PRN Windy Kalata, MD       . hydrALAZINE (APRESOLINE) injection 5 mg  5 mg Intravenous Q10 Min PRN Windy Kalata, MD       . meperidine (DEMEROL) injection 12.5 mg  12.5 mg Intravenous Q5 Min PRN Windy Kalata, MD           Allergies:    Allergies   Allergen Reactions   . Actonel [Risedronate Sodium]      Osteonecrosis jaw documented       Problem List:    Patient Active Problem List   Diagnosis Code   . Hyperlipidemia E78.5   . 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 Menopausal and postmenopausal disorder N95.9   . GERD (gastroesophageal reflux disease) K21.9   . Migraine headache G43.909   . Colon polyps K63.5   . Depression F32.9   . Intractable headache R51   . Hypoxemia R09.02   . Osteoporosis M81.0   . Intractable chronic migraine without aura G43.719   . Medication overuse headache G44.40       Past Medical History:        Diagnosis Date   . 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)    . 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 Colon polyps     adenomatous    . Dyspepsia     nonulcerative   . Graves disease    . High cholesterol    . History of  depression    . Hypertension    . Iron deficiency anemia     has had gi work up [employer]   . 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 Migraine     recurrent, controlled with elavil   . Osteoarthritis of lumbar spine     Chunduri   . Postmenopausal    . Thoracic compression fracture ([place])     T10 from a fall       Past Surgical History:        Procedure Laterality Date   . ARTERY BIOPSY Right 12-24-12    Temporal Artery biopsy right    . COLONOSCOPY  [id number]    dr Awanda Mink   3 years   . COLONOSCOPY N/A 07/09/2017    COLONOSCOPY POLYPECTOMY SNARE performed by Orvan Seen, MD at [facility]   . COLONOSCOPY N/A 07/09/2017    COLONOSCOPY WITH BIOPSY performed by Orvan Seen, MD at [facility]   . KYPHOSIS SURGERY      t10   . SHOULDER SURGERY      left humerus fx 3 places   . UPPER GASTROINTESTINAL ENDOSCOPY  08/26/2014    dr Shanon Brow hess:negative       Social History:    Social History   Substance Use Topics   . Smoking status: Current Some Day Smoker     Packs/day: 0.50     Years: 40.00     Last attempt to quit: 08/06/1997   . Smokeless tobacco: Never Used      Comment: passive exposure husband chain smokes   . Alcohol use 0.0 oz/week      Comment: rarely  Ready to quit: Not Answered  Counseling given: Not Answered      Vital Signs (Current):   Vitals:    08/07/17 1306 08/08/17 0958   BP:  (!) 128/50   Pulse:  94   Resp:  18   Temp:  97.2 F (36.2 C)   TempSrc:  Temporal   SpO2:  100%   Weight: 133 lb (60.3 kg) 134 lb 2 oz (60.8 kg)   Height: 5\' 4"  (1.626 m) 5\' 4"  (1.626 m)                                              BP Readings from Last 3 Encounters:   08/08/17 (!) 128/50   08/02/17 118/68   07/09/17 117/62       NPO Status: Time of last liquid consumption: 0800                        Time of last solid consumption: 1930                        Date of last liquid consumption: 08/08/17                        Date of last solid food consumption: 08/07/17    BMI:    Wt Readings from Last 3 Encounters:   08/08/17 134 lb 2 oz (60.8 kg)   08/02/17 133 lb 3.2 oz (60.4 kg)   07/09/17 128 lb 8 oz (58.3 kg)     Body mass index is 23.02 kg/m.    CBC:   Lab Results   Component Value Date    WBC 5.8 08/05/2017    RBC 3.54 08/05/2017    HGB 7.6 08/05/2017    HCT 24.5 08/05/2017    MCV 69.3 08/05/2017    RDW 19.7 08/05/2017    PLT 351 08/05/2017       CMP:   Lab Results   Component Value Date    NA 139 08/02/2017    K 4.4 08/02/2017    CL 99 08/02/2017    CO2 23 08/02/2017    BUN 8 08/02/2017    CREATININE 0.8 08/02/2017    GFRAA >60 08/02/2017    GFRAA >60 05/12/2010    AGRATIO 1.2 08/02/2017    LABGLOM >60 08/02/2017    GLUCOSE 93 08/02/2017    PROT 7.4 08/02/2017    PROT 7.8 05/12/2010    CALCIUM 9.2 08/02/2017    BILITOT 0.3 08/02/2017    ALKPHOS 150 08/02/2017    AST 17 08/02/2017    ALT 11 08/02/2017       POC Tests: No results for input(s): POCGLU, POCNA, POCK, POCCL, POCBUN, POCHEMO, POCHCT in the last 72 hours.    Coags:   Lab Results   Component Value Date    PROTIME 11.7 12/23/2012    INR 1.05 12/23/2012    APTT 34.5 12/23/2012       HCG (If Applicable): No results found for: PREGTESTUR, PREGSERUM, HCG, HCGQUANT     ABGs:   Lab Results   Component Value Date    PHART 7.409 12/23/2012    PO2ART 112.0 12/23/2012    PCO2ART 34.8 12/23/2012    HCO3ART 21.6 12/23/2012    BEART -2.0 12/23/2012  O2SATART 98.4 12/23/2012        Type & Screen (If Applicable):  No results found for: LABABO, [place]    Anesthesia Evaluation  Patient summary reviewed and Nursing notes reviewed no history of anesthetic complications:   Airway: Mallampati: II  TM distance: >3 FB   Neck ROM: full  Mouth opening: > = 3 FB Dental:          Pulmonary:Negative Pulmonary ROS                              Cardiovascular:    (+) hypertension:,                   Neuro/Psych:   (+) headaches: cluster headaches, psychiatric history:            GI/Hepatic/Renal:   (+) GERD:,           Endo/Other:    (+)  hyperthyroidism::., .                 Abdominal:           Vascular:                                        Anesthesia Plan      general     ASA 52     (72 year old female presents for EGD.  Plan general anesthesia with ASA standard monitors.  Questions answered.  Patient agreeable with anesthetic plan.  )  Induction: intravenous.      Anesthetic plan and risks discussed with patient.      Plan discussed with CRNA.    Attending anesthesiologist reviewed and agrees with Pre Eval content              Windy Kalata, MD   08/08/2017

## 2017-08-08 NOTE — Anesthesia Post-Procedure Evaluation (Signed)
Department of Anesthesiology  Postprocedure Note    Patient: CARLEY GLENDENNING  MRN: [id number]  Birthdate: 1946-03-11  Date of evaluation: 08/08/2017  Time:  11:11 AM     Procedure Summary     Date:  08/08/17 Room / Location:  [facility] / [facility]    Anesthesia Start:  1022 Anesthesia Stop:  8546    Procedure:  EGD ESOPHAGOGASTRODUODENOSCOPY (N/A ) Diagnosis:       Anemia, unspecified type      (ANEMIA)    Surgeon:  Orvan Seen, MD Responsible Provider:  Windy Kalata, MD    Anesthesia Type:  general ASA Status:  3          Anesthesia Type: general    Aldrete Phase I: Aldrete Score: 10    Aldrete Phase II: Aldrete Score: 10    Last vitals: Reviewed and per EMR flowsheets.       Anesthesia Post Evaluation    Patient location during evaluation: PACU  Patient participation: complete - patient participated  Level of consciousness: awake and alert  Pain score: 0  Airway patency: patent  Nausea & Vomiting: no nausea and no vomiting  Complications: no  Cardiovascular status: hemodynamically stable  Respiratory status: acceptable  Hydration status: euvolemic

## 2017-08-09 ENCOUNTER — Encounter

## 2017-08-09 ENCOUNTER — Inpatient Hospital Stay: Admit: 2017-08-09 | Payer: MEDICARE | Primary: Internal Medicine

## 2017-08-09 DIAGNOSIS — D508 Other iron deficiency anemias: Secondary | ICD-10-CM

## 2017-08-09 MED ORDER — BARIUM SULFATE 60 % PO SUSP
60 | ORAL | Status: AC
Start: 2017-08-09 — End: 2017-08-09

## 2017-08-09 MED FILL — LIQUID E-Z-PAQUE 60 % PO SUSP: 60 % | ORAL | Qty: 355

## 2017-08-12 ENCOUNTER — Ambulatory Visit: Admit: 2017-08-12 | Discharge: 2017-08-12 | Payer: MEDICARE | Attending: Surgery | Primary: Internal Medicine

## 2017-08-12 DIAGNOSIS — K219 Gastro-esophageal reflux disease without esophagitis: Secondary | ICD-10-CM

## 2017-08-12 NOTE — Progress Notes (Signed)
New Patient [name]   Linda Kim, MD    [address]  [place], [place] [address]  Phone: [phone]  Fax: [phone]    Linda [name]   Date of Birth:  1945-09-15    Date of Visit:  08/12/2017    No ref. provider found  Angelica Pou, MD    HPI:     Hiatal hernia / GERD: Patient is 72 y.o. female seen at request of Dr. Yolanda Manges.  Patient presents for evaluation of a hiatal hernia. She has had GERD for 30 years, but just recently found out about her hiatal hernia with her hernia workup.  She had an unremarkable colonoscopy, followed by an EGD showing a large hiatal hernia with Cameron's ulcers.  She has no significant issues with eating, bloating, early satiety.  Symptoms do not worsen with lying down.  She has had esophageal dilatation for stricture in the past by Dr. Awanda Mink.  She does not experience a sour taste in the back of her mouth.  She had a Hgb of 7.4 one month ago, which improved to 7.6 with her last check 8 days ago.  She smokes approximately 1 pack per month.    Hx of back surgeries x 2 including anterior exposure.      Pain Score:   0 - No pain    Allergies   Allergen Reactions   . Actonel [Risedronate Sodium]      Osteonecrosis jaw documented     Outpatient Prescriptions Marked as Taking for the 08/12/17 encounter (Office Visit) with Delton Coombes, MD   Medication Sig Dispense Refill   . methimazole (TAPAZOLE) 5 MG tablet Take 5 mg by mouth 2 times daily     . Polysaccharide Iron Complex 391.3 (180 Fe) MG CAPS One a day for six months 30 capsule 5   . divalproex (DEPAKOTE ER) 500 MG extended release tablet TAKE 1 TABLET BY MOUTH AT NIGHT FOR MIGRAINE PREVENTION IN ADDITION TO NADOLOL 90 tablet 2   . doxepin (SINEQUAN) 25 MG capsule TAKE 1 CAPSULE BY MOUTH EVERY EVENING FOR ITCHING INSTED OF HYDROXYZINE 90 capsule 3   . rOPINIRole (REQUIP) 1 MG tablet TAKE 1 TABLET BY MOUTH EVERY EVENING 90 tablet 3   .  nadolol (CORGARD) 20 MG tablet TAKE 1 TABLET BY MOUTH DAILY 90 tablet 3   . PARoxetine (PAXIL) 20 MG tablet TAKE 1 TABLET BY MOUTH DAILY 90 tablet 3   . Cobalamine Combinations (B12 FOLATE) 800-800 MCG CAPS One a day indefinitely 30 capsule 3   . lansoprazole (PREVACID) 30 MG capsule Take 1 capsule by mouth daily. 30 capsule 11   . Calcium Carbonate-Vitamin D (CALTRATE 600+D) 600-400 MG-UNIT CHEW Take 1 tablet by mouth 2 times daily. 60 tablet 11       Past Medical History:   Diagnosis Date   . 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 Colon polyps     adenomatous    . Dyspepsia     nonulcerative   . Graves disease    . High cholesterol    . History of depression    . Hypertension    . Iron deficiency anemia     has had gi work up [employer]   . 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 Migraine     recurrent, controlled with elavil   . Osteoarthritis of lumbar spine     Chunduri   . Other intestinal malabsorption 08/09/2017   . Postmenopausal    . Thoracic compression fracture ([place])     T10 from a fall     Past Surgical History:   Procedure Laterality Date   . ARTERY BIOPSY Right 12-24-12    Temporal Artery biopsy right    . COLONOSCOPY  [id number]    dr Awanda Mink   3 years   . COLONOSCOPY N/A 07/09/2017    COLONOSCOPY POLYPECTOMY SNARE performed by Orvan Seen, MD at [facility]   . COLONOSCOPY N/A 07/09/2017    COLONOSCOPY WITH BIOPSY performed by Orvan Seen, MD at [facility]   . KYPHOSIS SURGERY      t10   . SHOULDER SURGERY      left humerus fx 3 places   . UPPER GASTROINTESTINAL ENDOSCOPY  08/26/2014    dr Shanon Brow hess:negative   . UPPER GASTROINTESTINAL ENDOSCOPY N/A 08/08/2017    EGD ESOPHAGOGASTRODUODENOSCOPY performed by Orvan Seen, MD at [facility]     Family History   Problem Relation Age of Onset   . Migraines Other    .  Diabetes Other    . Breast Cancer Sister    . Cancer Mother    . Cancer Father      Social History     Social History   . Marital status: Married     Spouse name: N/A   . Number of children: N/A   . Years of education: N/A     Occupational History   . Not on file.     Social History Main Topics   . Smoking status: Current Some Day Smoker     Packs/day: 0.50     Years: 40.00     Last attempt to quit: 08/06/1997   . Smokeless tobacco: Never Used      Comment: passive exposure husband chain smokes   . Alcohol use 0.0 oz/week      Comment: rarely   . Drug use: No   . Sexual activity: Not Currently     Other Topics Concern   . Not on file     Social History Narrative   . No narrative on file          Vitals:    08/12/17 1510   BP: (!) 134/57   Pulse: (!) 46   Weight: 132 lb (59.9 kg)     Body mass index is 22.66 kg/m.     Wt Readings from Last 3 Encounters:   08/12/17 132 lb (59.9 kg)   08/08/17 134 lb 2 oz (60.8 kg)   08/02/17 133 lb 3.2 oz (60.4 kg)     BP Readings from Last 3 Encounters:   08/12/17 (!) 134/57   08/08/17 120/78   08/08/17 (!) 100/58          REVIEW OF SYSTEMS:   Pertinent positives are in HPI, otherwise all systems reviewed and negative    PHYSICAL EXAM:    CONSTITUTIONAL:  awake, alert, no apparent distress and normal weight  ENT:  normocephalic, without obvious abnormality  NECK:  supple, symmetrical, trachea midline   LUNGS:  Resp easy and unlabored  CARDIOVASCULAR:  regular rate and rhythm  ABDOMEN:  Lower midline scar well healed, soft,  non-distended, non-tender, voluntary guarding absent, and hernia absent  MUSCULOSKELETAL: No edema  NEUROLOGIC:  Mental Status Exam:  Level of Alertness:   awake  Orientation:  person, place, time        DATA:  No results for input(s): WBC, HGB, HCT, PLT, NA, K, CL, CO2, BUN, CREATININE, MG, PHOS, CALCIUM, PTT, INR, AST, ALT, BILITOT, BILIDIR, NITRU, COLORU, BACTERIA in the last 72 hours.    Invalid input(s): PT, WBCU, RBCU, LEUKOCYTESUA  CBC with Differential:     Lab Results   Component Value Date    WBC 5.8 08/05/2017    RBC 3.54 08/05/2017    HGB 7.6 08/05/2017    HCT 24.5 08/05/2017    PLT 351 08/05/2017    MCV 69.3 08/05/2017    MCH 21.3 08/05/2017    MCHC 30.8 08/05/2017    RDW 19.7 08/05/2017    SEGSPCT 41.6 05/12/2010    LYMPHOPCT 38.7 08/05/2017    MONOPCT 6.1 08/05/2017    EOSPCT 3.5 05/12/2010    BASOPCT 0.7 08/05/2017    MONOSABS 0.4 08/05/2017    LYMPHSABS 2.2 08/05/2017    EOSABS 0.3 08/05/2017    BASOSABS 0.0 08/05/2017    DIFFTYPE Auto-K 05/12/2010     BMP:    Lab Results   Component Value Date    NA 139 08/02/2017    K 4.4 08/02/2017    CL 99 08/02/2017    CO2 23 08/02/2017    BUN 8 08/02/2017    LABALBU 4.1 08/02/2017    CREATININE 0.8 08/02/2017    CALCIUM 9.2 08/02/2017    GFRAA >60 08/02/2017    GFRAA >60 05/12/2010    LABGLOM >60 08/02/2017    GLUCOSE 93 08/02/2017     Hepatic Function Panel:    Lab Results   Component Value Date    ALKPHOS 150 08/02/2017    ALT 11 08/02/2017    AST 17 08/02/2017    PROT 7.4 08/02/2017    PROT 7.8 05/12/2010    BILITOT 0.3 08/02/2017    BILIDIR 0.14 04/28/2010    IBILI 0.4 04/28/2010    LABALBU 4.1 08/02/2017       Imaging: UGI 08/09/17 personally reviewed with patient, showing:  Impression   Large hiatal hernia was demonstrated with mixed features. Best appreciated   on lateral view, the EG junction appears to be above the diaphragm; however,   the junction is well below the upper most portions of intrathoracic stomach.   Clinical correlation recommended.         ASSESSMENT:     Diagnosis Orders   1. Hiatal hernia with gastroesophageal reflux     2. Iron deficiency anemia due to chronic blood loss           PLAN:    The patient's anemia is secondary to her hiatal hernia.  She would benefit from surgical repair to prevent further blood loss.  She is on iron currently.  Hopefully her Hgb will improve to the 10 range prior to surgery to minimize the risk of blood transfusion and cardiac risk.  She will need esophageal  manometry preoperatively, which will hopefully be set up with Dr. Mena Pauls office.  We will plan on performing a laparoscopic esophageal hernia repair with Nissen fundoplication, possible open.  The risks including bleeding, infection, esophageal injury, and recurrence, along with the benefits and alternatives were discussed with the patient and she is willing to proceed with the operation.  Will have her get a pre-operative  H&P from Dr. Garen Grams prior to undergoing surgery.  I discussed smoking cessation with her.      Electronically signed by Delton Coombes, MD on 08/12/2017 at 3:45 PM

## 2017-08-12 NOTE — Patient Instructions (Signed)
Surgeon: [name], MD     Your surgery will be at [facility]  First floor of the [place].  [place], [place] [address]    Date:  Arrival time:  Surgery time:     Same day admission surgery with general anesthesia  Nothing to eat or drink after midnight the night before.  You will be on a 15 lbs weight restriction for 6 weeks after surgery.  No driving until your off the pain medication.      You may be asked to stop blood thinners such as Coumadin, Plavix, Fragmin, Lovenox, etc., or any anti-inflammatories such as:  Aspirin, Ibuprofen, Advil, Naproxen prior to your surgery.    We also ask that you stop any OTC medications such as fish oil, vitamin E, glucosamine, garlic, Multivitamins, COQ 10, etc.    Your time and instructions will be given to you by the surgery scheduler, Tye [place]. She will call you as well as send you a letter with all the details regarding your surgery.    Please make a History and Physical (surgery clearance) by your primary care physician prior to your surgery date. * within 30 days of surgery*     Please contact [facility] if you need to reschedule your surgery or have any questions. Our office phone number is [phone].

## 2017-08-13 NOTE — Telephone Encounter (Signed)
We have an order for [facility] from Dr. Garen Grams and insurance has been verified. Attempted to call patient twice to set up appointment, first time on Fri 08/09/17 and again today 08/13/17. Left a message both times for patient to return call.

## 2017-08-20 ENCOUNTER — Inpatient Hospital Stay: Admit: 2017-08-20 | Payer: MEDICARE | Primary: Internal Medicine

## 2017-08-20 DIAGNOSIS — D500 Iron deficiency anemia secondary to blood loss (chronic): Secondary | ICD-10-CM

## 2017-08-20 MED ORDER — NORMAL SALINE FLUSH 0.9 % IV SOLN
0.9 % | INTRAVENOUS | Status: DC | PRN
Start: 2017-08-20 — End: 2017-08-21

## 2017-08-20 MED ORDER — SODIUM CHLORIDE 0.9 % IV SOLN
0.9 % | Freq: Once | INTRAVENOUS | Status: AC
Start: 2017-08-20 — End: 2017-08-20
  Administered 2017-08-20: 14:00:00 750 mg via INTRAVENOUS

## 2017-08-20 MED FILL — INJECTAFER 750 MG/15ML IV SOLN: 750 MG/15ML | INTRAVENOUS | Qty: 15

## 2017-08-20 NOTE — Discharge Instructions (Signed)
Outpatient Infusion Discharge Instructions  [facility]  [address], [place] [address]  Telephone: [phone]      FAX [phone]    NAME:  Linda [name]  DATE OF BIRTH:  1945/12/05  MEDICAL RECORD NUMBER:  [id number]  DATE:  08/20/2017    Reason for Outpatient Infusion Visit: Injectafer #1 of 2    If you develop any these symptoms please contact you Doctor    . []  Nausea and/or vomiting not relieved with medication   . [x]  Swelling, redness, and/or bleeding at injection or IV site    . [x]  Fever or chills  . [x]  Rash or itching   . [x]  Shortness of breath  . []  Please review After Visit Summary (AVS) information on    . []  Other      Outpatient Infusion Center Information: Should you experience any significant changes in your health or have questions about your care please contact the [facility] at [place] 8:00 am - 4:00 pm.  If you need help outside these hours and cannot wait until we are again available, contact your Primary Care Physician or go to the hospital emergency room.       Electronically signed by Steele Sizer, RN on 08/20/2017 at 9:52 AM

## 2017-08-20 NOTE — Progress Notes (Signed)
Outpatient [facility] VISIT    NAME:  Linda [name]  DATE OF BIRTH:  04/19/1946  MEDICAL RECORD NUMBER:  [id number]  EPISODE DATE:  08/20/2017    Patient arrived to [facility]   []  per wheelchair   [x]  ambulatory     Has the patient had a previous problem with Injectafer or Iron Infusions? No  Any current infection or illness? No   Patient on antibiotics? No   History of Hypertension? No    Is the patient experiencing any:  Fatigue:   []    None  []    Increase over baseline but not altering normal activities  [x]    Moderate of causing difficulty performing some activities  []    Severe or loss of ability to perform some activities  []    Bedridden or disabling     Dizziness or Lightheadedness:   []    None  [x]    No Interference  []    Interferes with functioning but not activities of daily living  []    Interferes with daily activies  []    Bedridden or disabling     Shortness of Breath:   []    None   [x]    Dyspneic on exertion   []    Dyspnea with normal activities  []    Dyspnea at rest    Edema: none Location of edema: nothing    Tachycardia: No    Heart Palpitations: No      Chest Pain: No      BP (!) 149/60   Pulse 50   Temp 97.5 F (36.4 C) (Oral)   Resp 18   SpO2 99%   Patient Vitals for the past 2 hrs:   BP Temp Temp src Pulse Resp SpO2   08/20/17 1030 (!) 149/60 - - 50 18 -   08/20/17 0930 123/79 97.5 F (36.4 C) Oral (!) 45 18 99 %       Current Lab Data:  Hemoglobin/Hematocrit:    Lab Results   Component Value Date    HGB 7.6 08/05/2017    HCT 24.5 08/05/2017     IRON:    Lab Results   Component Value Date    IRON 71 08/05/2017     Iron Saturation:    Lab Results   Component Value Date    LABIRON 15 08/05/2017     TIBC:    Lab Results   Component Value Date    TIBC 486 08/05/2017     FERRITIN:    Lab Results   Component Value Date    FERRITIN 4.8 08/05/2017     Patient has been fatigued, some lightheadedness with getting up from bed, SOB.  Will be having hiatal hernia worked on per Dr. Awanda Mink and is seeing Dr. Garen Grams next week for preop physical. HR is 44 per monitor and Apical pulse is 46 and regular. States Dr. Garen Grams is aware of low HR and is managing her medication.  Instructed patient on actions and potential side effects of Injectafer. Patient given written instructions on Injectafer and iron-rich diet.    Dose Administered: 750 mg  Todays dose is number 1 out of2 ordered for this patient.    Response to treatment:  Well tolerated by patient.    Education:    Indicates understanding    Scheduled to return for next dose of Injectafer on August 29, 2017.     Electronically signed by Steele Sizer,  RN on 08/20/2017 at 10:49 AM

## 2017-08-21 ENCOUNTER — Ambulatory Visit: Admit: 2017-08-21 | Discharge: 2017-08-21 | Payer: MEDICARE | Attending: Internal Medicine | Primary: Internal Medicine

## 2017-08-21 DIAGNOSIS — K449 Diaphragmatic hernia without obstruction or gangrene: Secondary | ICD-10-CM

## 2017-08-21 MED ORDER — NADOLOL 20 MG PO TABS
20 | ORAL_TABLET | ORAL | 3 refills | Status: DC
Start: 2017-08-21 — End: 2017-11-24

## 2017-08-21 MED ORDER — DIVALPROEX SODIUM ER 500 MG PO TB24
500 | ORAL_TABLET | ORAL | 2 refills | Status: DC
Start: 2017-08-21 — End: 2017-11-21

## 2017-08-21 NOTE — Patient Instructions (Signed)
Double depakote 500mg  2 at night  And halve the nadolol 10mg  at night

## 2017-08-21 NOTE — Progress Notes (Signed)
OUTPATIENT PROGRESS NOTE  Date of Service:  08/21/2017  Address: [facility]  [place] [address]  Dept: [phone]  Loc: [phone]    Subjective:     Chief Complaint   Patient presents with   . Pre-op Exam     PatientID: [id number]  Linda [name] is a 72 y.o. female  (Location/Symptom, Timing/Onset, Context/Setting, Quality, Duration, Modifying Factors, Severity)  HPIwith PUD, iron deficiency anemia, and sliding hiatal hernia who is here for preop evaluation prior to surgical repair under general anesthesia by Dr Ephraim Hamburger in early February. She has no known history of CAD.  She has been active without any chest pains or sob.  With the onset of her anemia she has been a little more winded.  She can walk up two flights of stairs without  Difficulty.  She has chronic severe migraines and has been on nadolol and depakote ER for prevention.  She has been in chronic sinus brady from the nadolol.    Allergies   Allergen Reactions   . Actonel [Risedronate Sodium]      Osteonecrosis jaw documented     Current Outpatient Prescriptions   Medication Sig Dispense Refill   . methimazole (TAPAZOLE) 5 MG tablet Take 5 mg by mouth 2 times daily     . Polysaccharide Iron Complex 391.3 (180 Fe) MG CAPS One a day for six months 30 capsule 5   . divalproex (DEPAKOTE ER) 500 MG extended release tablet TAKE 1 TABLET BY MOUTH AT NIGHT FOR MIGRAINE PREVENTION IN ADDITION TO NADOLOL 90 tablet 2   . doxepin (SINEQUAN) 25 MG capsule TAKE 1 CAPSULE BY MOUTH EVERY EVENING FOR ITCHING INSTED OF HYDROXYZINE 90 capsule 3   . rOPINIRole (REQUIP) 1 MG tablet TAKE 1 TABLET BY MOUTH EVERY EVENING 90 tablet 3   . nadolol (CORGARD) 20 MG tablet TAKE 1 TABLET BY MOUTH DAILY 90 tablet 3   . PARoxetine (PAXIL) 20 MG tablet TAKE 1 TABLET BY MOUTH DAILY 90 tablet 3   . Cobalamine Combinations (B12 FOLATE) 800-800 MCG CAPS One a day indefinitely 30 capsule 3   . lansoprazole (PREVACID)  30 MG capsule Take 1 capsule by mouth daily. 30 capsule 11   . Calcium Carbonate-Vitamin D (CALTRATE 600+D) 600-400 MG-UNIT CHEW Take 1 tablet by mouth 2 times daily. 60 tablet 11     No current facility-administered medications for this visit.      Past Medical History:   Diagnosis Date   . 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 Colon polyps     adenomatous    . Dyspepsia     nonulcerative   . Graves disease    . High cholesterol    . History of depression    . Hypertension    . Iron deficiency anemia     has had gi work up [employer]   . 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 Migraine     recurrent, controlled with elavil   . Osteoarthritis of lumbar spine     Chunduri   . Other intestinal malabsorption 08/09/2017   . Postmenopausal    . Thoracic compression fracture ([place])     T10 from a fall     Past Surgical  History:   Procedure Laterality Date   . ARTERY BIOPSY Right 12-24-12    Temporal Artery biopsy right    . COLONOSCOPY  [id number]    dr Awanda Mink   3 years   . COLONOSCOPY N/A 07/09/2017    COLONOSCOPY POLYPECTOMY SNARE performed by Orvan Seen, MD at [place]   . COLONOSCOPY N/A 07/09/2017    COLONOSCOPY WITH BIOPSY performed by Orvan Seen, MD at [place]   . KYPHOSIS SURGERY      t10   . SHOULDER SURGERY      left humerus fx 3 places   . UPPER GASTROINTESTINAL ENDOSCOPY  08/26/2014    dr Shanon Brow hess:negative   . UPPER GASTROINTESTINAL ENDOSCOPY N/A 08/08/2017    EGD ESOPHAGOGASTRODUODENOSCOPY performed by Orvan Seen, MD at [facility] History   Substance Use Topics   . Smoking status: Current Some Day Smoker     Packs/day: 0.50     Years: 40.00     Last attempt to quit: 08/06/1997   . Smokeless tobacco: Never Used      Comment: passive exposure husband chain smokes   . Alcohol use 0.0 oz/week      Comment:  rarely     Family History   Problem Relation Age of Onset   . Migraines Other    . Diabetes Other    . Breast Cancer Sister    . Cancer Mother    . Cancer Father           Review of Systems   Constitutional: Positive for fatigue.   Neurological: Positive for headaches.   All other systems reviewed and are negative.      Objective:   Physical Exam   Constitutional: She is oriented to person, place, and time. She appears well-developed and well-nourished. No distress.   Very pale   HENT:   Head: Normocephalic and atraumatic.   Right Ear: External ear normal.   Left Ear: External ear normal.   Nose: Nose normal.   Mouth/Throat: Oropharynx is clear and moist.   Broken tooth upper right  Multiple caps  Pos gag   Eyes: Pupils are equal, round, and reactive to light. EOM are normal. Right eye exhibits no discharge. Left eye exhibits no discharge.   Neck: Normal range of motion. Neck supple. No JVD present. No tracheal deviation present. Thyromegaly present.   Cardiovascular: Regular rhythm and normal heart sounds.  Exam reveals no gallop.    No murmur heard.  Slow rate regular   Pulmonary/Chest: Effort normal and breath sounds normal. No stridor. No respiratory distress.   Abdominal: Soft. Bowel sounds are normal. She exhibits no distension. There is no tenderness.   Musculoskeletal: Normal range of motion. She exhibits no edema.   Lymphadenopathy:     She has no cervical adenopathy.   Neurological: She is alert and oriented to person, place, and time. She has normal reflexes.   Skin: Skin is warm and dry. She is not diaphoretic.   Psychiatric: She has a normal mood and affect. Her behavior is normal. Judgment and thought content normal.   Nursing note and vitals reviewed.    EKG : sinus brady 39, rSR' pattern unchanged    Assessment/Plan     1. Sliding hiatal hernia  Newly diagnosed   Plan to have nissen fundoplication    2. Pre-op examination  Not quite medically stable at this point, too brady and too anemic  Plan : cut  nadolol in half to 10mg , continue iron infusions, recheck cbc and tsh today, adjust tapazole in case too hypothyroid today      3. Iron deficiency anemia due to chronic blood loss  Ongoing  Continue iron infusions goal hb 10    4. PUD (peptic ulcer disease)  Ongoing cammeron lesions causing iron deficiency   To be addressed with surgery          Spent 45 minutes with over half the time spent on discussing treatment           Angelica Pou, MD

## 2017-08-22 LAB — CBC WITH AUTO DIFFERENTIAL
Basophils %: 0.3 %
Basophils Absolute: 0 10*3/uL (ref 0.0–0.2)
Eosinophils %: 3.5 %
Eosinophils Absolute: 0.2 10*3/uL (ref 0.0–0.6)
Hematocrit: 26 % — ABNORMAL LOW (ref 36.0–48.0)
Hemoglobin: 7.8 g/dL — ABNORMAL LOW (ref 12.0–16.0)
Lymphocytes %: 30.6 %
Lymphocytes Absolute: 1.9 10*3/uL (ref 1.0–5.1)
MCH: 21.2 pg — ABNORMAL LOW (ref 26.0–34.0)
MCHC: 30 g/dL — ABNORMAL LOW (ref 31.0–36.0)
MCV: 70.6 fL — ABNORMAL LOW (ref 80.0–100.0)
MPV: 8 fL (ref 5.0–10.5)
Monocytes %: 5.3 %
Monocytes Absolute: 0.3 10*3/uL (ref 0.0–1.3)
Neutrophils %: 60.3 %
Neutrophils Absolute: 3.8 10*3/uL (ref 1.7–7.7)
Platelets: 355 10*3/uL (ref 135–450)
RBC: 3.69 M/uL — ABNORMAL LOW (ref 4.00–5.20)
RDW: 20.6 % — ABNORMAL HIGH (ref 12.4–15.4)
WBC: 6.3 10*3/uL (ref 4.0–11.0)

## 2017-08-22 LAB — T3, FREE: T3, Free: 2.3 pg/mL (ref 2.3–4.2)

## 2017-08-22 LAB — T4, FREE: T4 Free: 0.5 ng/dL — ABNORMAL LOW (ref 0.9–1.8)

## 2017-08-22 LAB — TSH: TSH: 92.74 u[IU]/mL — ABNORMAL HIGH (ref 0.27–4.20)

## 2017-08-23 NOTE — Telephone Encounter (Signed)
Mailbox full

## 2017-08-23 NOTE — Telephone Encounter (Signed)
Patient having problems with rapidly fluctuating thyroid levels  TSH went from suppressed the high   Free T4 went low  With Tapazole 15 bid which was a dose for more severe hyperthyroidism    Tapazole 5 to 10 mg once a day more appropriate      Advised to follow weekly TSH and free t4 and t3   When free t3 gets high again restart low dose tapazole  Even if tsh still high, that will take longer to come down  If fluctuation too much continues may need radioactive treatment    Consider delaying surgery with hypothyroidism as anesthesia metabolized slower under these circumstances. Will need to discuss with Dr Ephraim Hamburger

## 2017-08-23 NOTE — Telephone Encounter (Signed)
Patient is return call from our office said that her voicemail is full and does not know what it was regarding    [facility] [phone]    Please Advise

## 2017-08-27 ENCOUNTER — Inpatient Hospital Stay: Payer: MEDICARE

## 2017-08-27 MED ORDER — LIDOCAINE HCL 2 % EX GEL
2 % | Freq: Once | CUTANEOUS | Status: AC | PRN
Start: 2017-08-27 — End: 2017-08-27
  Administered 2017-08-27: 15:00:00 10 via TOPICAL

## 2017-08-27 MED ORDER — LIDOCAINE HCL 2 % EX GEL
2 | CUTANEOUS | Status: AC
Start: 2017-08-27 — End: 2017-08-27

## 2017-08-27 MED FILL — GLYDO 2 % EX GEL: 2 % | CUTANEOUS | Qty: 11

## 2017-08-27 NOTE — H&P (Signed)
[facility]   Pre-operative History and Physical    Patient: Linda [name]  DOB: 1945-11-01  Acct#: [id number]    History Obtained From: electronic medical record    HISTORY OF PRESENT ILLNESS  Procedure:esophageal motility study  Indications:pre-op repair of hiatal hernia  Past Medical History:        Diagnosis Date   . 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 Colon polyps     adenomatous    . Dyspepsia     nonulcerative   . Graves disease    . High cholesterol    . History of depression    . Hypertension    . Iron deficiency anemia     has had gi work up [employer]   . 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 Migraine     recurrent, controlled with elavil   . Osteoarthritis of lumbar spine     Chunduri   . Other intestinal malabsorption 08/09/2017   . Postmenopausal    . Thoracic compression fracture ([place])     T10 from a fall     Past Surgical History:        Procedure Laterality Date   . ARTERY BIOPSY Right 12-24-12    Temporal Artery biopsy right    . COLONOSCOPY  [id number]    dr Awanda Mink   3 years   . COLONOSCOPY N/A 07/09/2017    COLONOSCOPY POLYPECTOMY SNARE performed by Orvan Seen, MD at [place]   . COLONOSCOPY N/A 07/09/2017    COLONOSCOPY WITH BIOPSY performed by Orvan Seen, MD at [facility]   . ESOPHAGEAL MOTILITY STUDY N/A 08/27/2017    ESOPHAGEAL MANOMETRY performed by Orvan Seen, MD at [facility]   . KYPHOSIS SURGERY      t10   . SHOULDER SURGERY      left humerus fx 3 places   . UPPER GASTROINTESTINAL ENDOSCOPY  08/26/2014    dr Shanon Brow [place] Null:negative   . UPPER GASTROINTESTINAL ENDOSCOPY N/A 08/08/2017    EGD ESOPHAGOGASTRODUODENOSCOPY performed by Orvan Seen, MD at [facility] [place]     Medications prior to admission:   Prior to Admission medications    Medication Sig Start Date End Date Taking?  Authorizing Provider   divalproex (DEPAKOTE ER) 500 MG extended release tablet TAKE 2 TABLET BY MOUTH AT NIGHT FOR MIGRAINE PREVENTION IN ADDITION TO NADOLOL 08/21/17   Angelica Pou, MD   nadolol (CORGARD) 20 MG tablet TAKE 1/2 TABLET BY MOUTH DAILY 08/21/17   Angelica Pou, MD   methimazole (TAPAZOLE) 5 MG tablet Take 5 mg by mouth 2 times daily    Historical Provider, MD   Polysaccharide Iron Complex 391.3 (180 Fe) MG CAPS One a day for six months 08/02/17   Angelica Pou, MD   doxepin (SINEQUAN) 25 MG capsule TAKE 1 CAPSULE BY MOUTH EVERY EVENING FOR ITCHING INSTED OF HYDROXYZINE 07/23/17   Angelica Pou, MD   rOPINIRole (REQUIP) 1 MG tablet TAKE 1 TABLET BY MOUTH EVERY EVENING 07/23/17   Angelica Pou, MD   PARoxetine (PAXIL) 20 MG tablet TAKE 1 TABLET BY MOUTH DAILY 07/02/17   Angelica Pou, MD   Cobalamine Combinations (B12 FOLATE) 800-800 MCG CAPS One a day indefinitely 06/24/17   Angelica Pou, MD  lansoprazole (PREVACID) 30 MG capsule Take 1 capsule by mouth daily. 01/16/10 06/24/18  Angelica Pou, MD   Calcium Carbonate-Vitamin D (CALTRATE 600+D) 600-400 MG-UNIT CHEW Take 1 tablet by mouth 2 times daily. 01/16/10   Angelica Pou, MD     Allergies:   Edd Fabian [risedronate sodium]    Social History     Social History   . Marital status: Married     Spouse name: N/A   . Number of children: N/A   . Years of education: N/A     Occupational History   . Not on file.     Social History Main Topics   . Smoking status: Current Some Day Smoker     Packs/day: 0.50     Years: 40.00     Last attempt to quit: 08/06/1997   . Smokeless tobacco: Never Used      Comment: passive exposure husband chain smokes   . Alcohol use 0.0 oz/week      Comment: rarely   . Drug use: No   . Sexual activity: Not Currently     Other Topics Concern   . Not on file     Social History Narrative   . No narrative on file     Family History   Problem Relation Age of Onset   . Migraines Other    . Diabetes Other    . Breast Cancer Sister    . Cancer Mother    . Cancer Father           PHYSICAL EXAM:      BP (!) 149/65   Pulse 51   Temp 97 F (36.1 C) (Temporal)   Resp 14   Ht 5\' 4"  (1.626 m)   Wt 126 lb 1.7 oz (57.2 kg)   SpO2 100%   BMI 21.65 kg/m  I        Heart:normal    Lungs: normal    Abdomen: normal      ASSESSMENT AND PLAN:    1.  Procedure options, risks and benefits reviewed with patient and expresses understanding.

## 2017-08-27 NOTE — Procedures (Signed)
[place] [address]                                 PROCEDURE NOTE    PATIENT NAME: Linda [name], Linda A                  DOB:        1945/08/09  MED REC NO:   [id number]                          ROOM:  ACCOUNT NO:   [id number]                           ADMIT DATE: 08/27/2017  PROVIDER:     Yolanda Manges, MD    ESOPHAGEAL MOTILITY STUDY    DATE OF PROCEDURE:  08/28/2017    INDICATION:  Preoperative repair of [name] hiatal hernia.    FINDINGS:  Her lower esophageal sphincter pressure was normal at 25  mmHg.  Total length is 2.9 with [name] median IRP of 6 mmHg and that was  normal.  Her lower esophageal body had [name] normal DCI of 50%.  She had 50%  ineffective swallows but the pattern classification was normal  esophageal motility.  Her impedance was low at 60% for the complete  transit of liquid swallows and 40% of viscous swallows.    My impression is that of [name] normal esophageal motility study with [name]  normal lower esophageal sphincter pressure.  She did have some  ineffective swallows and did have [name] low impedance but it is overall [name]  normal esophageal motility and it is safe to proceed with her hiatal  hernia repair and that we do not see any evidence of achalasia or  diffuse esophageal spasm.        Yolanda Manges, MD    D: 08/28/2017 12:48:15       T: 08/28/2017 19:43:54     [id number]  Job#: [id number]     Doc#: [id number]    CC:  Yolanda Manges, MD

## 2017-08-27 NOTE — Telephone Encounter (Signed)
If addressed please note and sign

## 2017-08-27 NOTE — Progress Notes (Signed)
Procedure teaching given to pt.  Pt verbalized understanding.  Lidocaine inserted into pt. Nares.  Vital signs not monitored during procedure due to no sedation given.  Manometry probe inserted  Into Left nares without resistance.  Probe Depth 39 cm. Pt tolerated well.  Procedure completed without difficulty.  Pt denies any concerns or questions at this time.  Discharge instructions given to pt.  Pt verbalized understanding of discharge instructions. Pt discharged ambulatory.

## 2017-08-28 ENCOUNTER — Encounter

## 2017-08-28 ENCOUNTER — Ambulatory Visit: Admit: 2017-08-28 | Discharge: 2017-08-28 | Payer: MEDICARE | Attending: Internal Medicine | Primary: Internal Medicine

## 2017-08-28 DIAGNOSIS — E0590 Thyrotoxicosis, unspecified without thyrotoxic crisis or storm: Secondary | ICD-10-CM

## 2017-08-28 NOTE — Progress Notes (Signed)
OUTPATIENT PROGRESS NOTE  Date of Service:  08/28/2017  Address: [facility]  [place] [address]  Dept: [phone]  Loc: [phone]    Subjective:     Chief Complaint   Patient presents with   . Pre-op Exam     PatientID: [id number]  Linda [name] is a 72 y.o. female  (Location/Symptom, Timing/Onset, Context/Setting, Quality, Duration, Modifying Factors, Severity)  HPIwith hyperthyroid, iron deficiency anemia and migraine headaches who is here for recheck/  She has gotten one iron infusion.  Goal hb 10.  She feels better in that the migraines have stopped,  Also the pruritis and rLs and the strong urine.  Loosing weight still  Allergies   Allergen Reactions   . Actonel [Risedronate Sodium]      Osteonecrosis jaw documented     Current Outpatient Prescriptions   Medication Sig Dispense Refill   . divalproex (DEPAKOTE ER) 500 MG extended release tablet TAKE 2 TABLET BY MOUTH AT NIGHT FOR MIGRAINE PREVENTION IN ADDITION TO NADOLOL 90 tablet 2   . nadolol (CORGARD) 20 MG tablet TAKE 1/2 TABLET BY MOUTH DAILY 90 tablet 3   . methimazole (TAPAZOLE) 5 MG tablet Take 5 mg by mouth 2 times daily     . Polysaccharide Iron Complex 391.3 (180 Fe) MG CAPS One a day for six months 30 capsule 5   . doxepin (SINEQUAN) 25 MG capsule TAKE 1 CAPSULE BY MOUTH EVERY EVENING FOR ITCHING INSTED OF HYDROXYZINE 90 capsule 3   . rOPINIRole (REQUIP) 1 MG tablet TAKE 1 TABLET BY MOUTH EVERY EVENING 90 tablet 3   . PARoxetine (PAXIL) 20 MG tablet TAKE 1 TABLET BY MOUTH DAILY 90 tablet 3   . Cobalamine Combinations (B12 FOLATE) 800-800 MCG CAPS One a day indefinitely 30 capsule 3   . lansoprazole (PREVACID) 30 MG capsule Take 1 capsule by mouth daily. 30 capsule 11   . Calcium Carbonate-Vitamin D (CALTRATE 600+D) 600-400 MG-UNIT CHEW Take 1 tablet by mouth 2 times daily. 60 tablet 11     No current facility-administered medications for this visit.      Past Medical  History:   Diagnosis Date   . 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 Colon polyps     adenomatous    . Dyspepsia     nonulcerative   . Graves disease    . High cholesterol    . History of depression    . Hypertension    . Iron deficiency anemia     has had gi work up [employer]   . 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 Migraine     recurrent, controlled with elavil   . Osteoarthritis of lumbar spine     Chunduri   . Other intestinal malabsorption 08/09/2017   . Postmenopausal    . Thoracic compression fracture ([place])     T10 from a fall     Past Surgical History:   Procedure Laterality Date   . ARTERY BIOPSY Right 12-24-12    Temporal Artery biopsy right    . COLONOSCOPY  [id number]    dr Awanda Mink   3 years   . COLONOSCOPY N/A 07/09/2017    COLONOSCOPY POLYPECTOMY SNARE performed by Orvan Seen,  MD at [facility]   . COLONOSCOPY N/A 07/09/2017    COLONOSCOPY WITH BIOPSY performed by Orvan Seen, MD at [facility]   . ESOPHAGEAL MOTILITY STUDY N/A 08/27/2017    ESOPHAGEAL MANOMETRY performed by Orvan Seen, MD at [place] [facility]   . KYPHOSIS SURGERY      t10   . SHOULDER SURGERY      left humerus fx 3 places   . UPPER GASTROINTESTINAL ENDOSCOPY  08/26/2014    dr Shanon Brow hess:negative   . UPPER GASTROINTESTINAL ENDOSCOPY N/A 08/08/2017    EGD ESOPHAGOGASTRODUODENOSCOPY performed by Orvan Seen, MD at [facility] History   Substance Use Topics   . Smoking status: Current Some Day Smoker     Packs/day: 0.50     Years: 40.00     Last attempt to quit: 08/06/1997   . Smokeless tobacco: Never Used      Comment: passive exposure husband chain smokes   . Alcohol use 0.0 oz/week      Comment: rarely     Family History   Problem Relation Age of Onset   . Migraines Other    . Diabetes Other    . Breast Cancer Sister    . Cancer Mother     . Cancer Father           Review of Systems   HENT:        Teeth hurt   All other systems reviewed and are negative.      Objective:   Physical Exam   Constitutional: She is oriented to person, place, and time. She appears well-developed and well-nourished. No distress.   HENT:   Head: Normocephalic and atraumatic.   Right Ear: External ear normal.   Left Ear: External ear normal.   Nose: Nose normal.   Mouth/Throat: Oropharynx is clear and moist.   Eyes: Pupils are equal, round, and reactive to light. EOM are normal. Right eye exhibits no discharge. Left eye exhibits no discharge.   Neck: Normal range of motion. Neck supple. No JVD present. No tracheal deviation present. No thyromegaly present.   Cardiovascular: Normal rate, regular rhythm and normal heart sounds.  Exam reveals no gallop.    No murmur heard.  Pulmonary/Chest: Effort normal and breath sounds normal. No stridor. No respiratory distress.   Abdominal: Soft. Bowel sounds are normal. She exhibits no distension. There is no tenderness.   Musculoskeletal: Normal range of motion. She exhibits no edema.   Lymphadenopathy:     She has no cervical adenopathy.   Neurological: She is alert and oriented to person, place, and time. She has normal reflexes.   Skin: Skin is warm and dry. She is not diaphoretic.   Psychiatric: She has a normal mood and affect. Her behavior is normal. Judgment and thought content normal.   Nursing note and vitals reviewed.        Assessment/Plan     1.  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 Iron def anemia due to gi blood loss  Plan : recheck cbc today goal hb 10    3. RLS resolved  requip stopped iron must be better    4. Migraines stopped  Plan : stick on depakote 500mg     Angelica Pou, MD

## 2017-08-29 ENCOUNTER — Inpatient Hospital Stay: Admit: 2017-08-29 | Payer: MEDICARE | Primary: Internal Medicine

## 2017-08-29 DIAGNOSIS — D500 Iron deficiency anemia secondary to blood loss (chronic): Secondary | ICD-10-CM

## 2017-08-29 LAB — CBC WITH AUTO DIFFERENTIAL
Basophils %: 0.2 %
Basophils Absolute: 0 10*3/uL (ref 0.0–0.2)
Eosinophils %: 2.6 %
Eosinophils Absolute: 0.2 10*3/uL (ref 0.0–0.6)
Hematocrit: 30.7 % — ABNORMAL LOW (ref 36.0–48.0)
Hemoglobin: 9.2 g/dL — ABNORMAL LOW (ref 12.0–16.0)
Lymphocytes %: 38.7 %
Lymphocytes Absolute: 2.5 10*3/uL (ref 1.0–5.1)
MCH: 22.3 pg — ABNORMAL LOW (ref 26.0–34.0)
MCHC: 29.8 g/dL — ABNORMAL LOW (ref 31.0–36.0)
MCV: 74.8 fL — ABNORMAL LOW (ref 80.0–100.0)
MPV: 8.1 fL (ref 5.0–10.5)
Monocytes %: 6.5 %
Monocytes Absolute: 0.4 10*3/uL (ref 0.0–1.3)
Neutrophils %: 52 %
Neutrophils Absolute: 3.3 10*3/uL (ref 1.7–7.7)
Platelets: 328 10*3/uL (ref 135–450)
RBC: 4.1 M/uL (ref 4.00–5.20)
RDW: 20.6 % — ABNORMAL HIGH (ref 12.4–15.4)
WBC: 6.4 10*3/uL (ref 4.0–11.0)

## 2017-08-29 LAB — T3, FREE: T3, Free: 3.2 pg/mL (ref 2.3–4.2)

## 2017-08-29 LAB — T4, FREE: T4 Free: 0.7 ng/dL — ABNORMAL LOW (ref 0.9–1.8)

## 2017-08-29 LAB — TSH: TSH: 10.66 u[IU]/mL — ABNORMAL HIGH (ref 0.27–4.20)

## 2017-08-29 MED ORDER — NORMAL SALINE FLUSH 0.9 % IV SOLN
0.9 % | INTRAVENOUS | Status: DC | PRN
Start: 2017-08-29 — End: 2017-08-30

## 2017-08-29 MED ORDER — SODIUM CHLORIDE 0.9 % IV SOLN
0.9 % | Freq: Once | INTRAVENOUS | Status: AC
Start: 2017-08-29 — End: 2017-08-29
  Administered 2017-08-29: 16:00:00 750 mg via INTRAVENOUS

## 2017-08-29 MED FILL — INJECTAFER 750 MG/15ML IV SOLN: 750 MG/15ML | INTRAVENOUS | Qty: 15

## 2017-08-29 NOTE — Discharge Instructions (Addendum)
ferric carboxymaltose  Pronunciation:  FER ik kar BOX ee MAWL tose  Brand:  Injectafer  What is the most important information I should know about ferric carboxymaltose?  You should not use ferric carboxymaltose if you have iron overload disorder, or anemia that is not caused by iron deficiency.  What is ferric carboxymaltose?  Ferric carboxymaltose is an iron replacement product. You normally get iron from the foods you eat. Iron helps your body produce red blood cells that carry oxygen through your blood to tissues and organs.  Ferric carboxymaltose is used in adults to treat iron deficiency anemia (a lack of red blood cells caused by having too little iron in the body). Ferric carboxymaltose is usually given when iron taken orally (by mouth) is not effective.  Ferric carboxymaltose may also be used for purposes not listed in this medication guide.  What should I discuss with my healthcare provider before receiving ferric carboxymaltose?  You should not use ferric carboxymaltose if you are allergic to it, or if you have:   iron overload disorder (hemochromatosis, hemosiderosis); or   anemia that is not caused by iron deficiency.  Tell your doctor if you have ever had:   liver disease; or   high blood pressure.  It is not known whether this medicine will harm an unborn baby. Tell your doctor if you are pregnant or plan to become pregnant.  It may not be safe to breast-feed a baby while you are using this medicine. Ask your doctor about any risks.  How is ferric carboxymaltose given?  Ferric carboxymaltose is given as an infusion into a vein. A healthcare provider will give you this injection.  Ferric carboxymaltose is usually given in two doses, 7 days apart. Your doctor will determine whether you need a repeat course of these two doses.  Tell your caregivers if you feel any burning, pain, or swelling around the IV needle when ferric carboxymaltose is injected.  You will be watched closely for at least 30  minutes to make sure you do not have an allergic reaction.  You may need frequent medical tests. Even if you have no symptoms, tests can help your doctor determine if this medicine is effective.  What happens if I miss a dose?  Call your doctor for instructions if you miss an appointment for your ferric carboxymaltose injection.  What happens if I overdose?  Seek emergency medical attention or call the Poison Help line at 1-800-222-1222.  Overdose symptoms may include weakness, joint problems, and problems with balance or walking.  What should I avoid after receiving ferric carboxymaltose?  Iron is contained in many vitamin or mineral supplements. Taking certain products together can cause you to get too much iron. Avoid taking any vitamin or mineral supplement your doctor has not recommended.  What are the possible side effects of ferric carboxymaltose?  Get emergency medical help if you have signs of an allergic reaction: hives; feeling light-headed; difficult breathing; swelling of your face, lips, tongue, or throat.  Call your doctor at once if you have:   increased blood pressure --severe headache, pounding in your neck or ears, dizziness, nausea;   low levels of phosphorus in your blood --confusion, bone pain, muscle weakness; or   high levels of iron stored in your body --metallic taste in the mouth, bloody or tarry stools, vomiting blood, severe shortness of breath, chest pain, pale skin, blue lips or fingernails, loss of consciousness, or seizure (convulsions).  Common side effects may include:     nausea;   dizziness;   high blood pressure;   flushing (warmth, redness, or tingly feeling); or   low phosphorus levels.  This is not a complete list of side effects and others may occur. Call your doctor for medical advice about side effects. You may report side effects to FDA at 1-800-FDA-1088.  What other drugs will affect ferric carboxymaltose?  Other drugs may affect ferric carboxymaltose, including  prescription and over-the-counter medicines, vitamins, and herbal products. Tell your doctor about all your current medicines and any medicine you start or stop using.  Where can I get more information?  Your pharmacist can provide more information about ferric carboxymaltose.    Remember, keep this and all other medicines out of the reach of children, never share your medicines with others, and use this medication only for the indication prescribed.  Every effort has been made to ensure that the information provided by [facility] is accurate, up-to-date, and complete, but no guarantee is made to that effect. Drug information contained herein may be time sensitive. [employer] information has been compiled for use by healthcare practitioners and consumers in the [place] and therefore [employer] does not warrant that uses outside of the [place] are appropriate, unless specifically indicated otherwise. [employer]'s drug information does not endorse drugs, diagnose patients or recommend therapy. [employer]'s drug information is an Scientist, research (physical sciences) to assist licensed healthcare practitioners in caring for their patients and/or to serve consumers viewing this service as a supplement to, and not a substitute for, the expertise, skill, knowledge and judgment of healthcare practitioners. The absence of a warning for a given drug or drug combination in no way should be construed to indicate that the drug or drug combination is safe, effective or appropriate for any given patient. [employer] does not assume any responsibility for any aspect of healthcare administered with the aid of information [employer] provides. The information contained herein is not intended to cover all possible uses, directions, precautions, warnings, drug interactions, allergic reactions, or adverse effects. If you have questions about the drugs you are taking, check with your doctor, nurse or pharmacist.  Copyright [phone]  [facility] [place]: 2.01. Revision date: 11/27/2016.  Care instructions adapted under license by [facility]. If you have questions about a medical condition or this instruction, always ask your healthcare professional. Arma any warranty or liability for your use of this information.  Outpatient Infusion Discharge Instructions  [facility]  [address], [place] [address]  Telephone: [phone]      FAX [phone]    NAME:  Linda [name]  DATE OF BIRTH:  [birth year]  MEDICAL RECORD NUMBER:  [id number]  DATE:  08/29/2017    Reason for Outpatient Infusion Visit: injectafer    If you develop any these symptoms please contact you Doctor    . [x]  Nausea and/or vomiting not relieved with medication   . [x]  Swelling, redness, and/or bleeding at injection or IV site    . [x]  Fever or chills  . [x]  Rash or itching   . [x]  Shortness of breath  . []  Please review After Visit Summary (AVS) information on    . []  Other      Outpatient Infusion Center Information: Should you experience any significant changes in your health or have questions about your care please contact the [facility] [place] 8:00 am - 4:00 pm.  If you need help outside these hours and cannot wait until we are again available, contact your Primary Care Physician or go to the hospital emergency room.       Electronically signed by Kandee Keen, RN on 08/29/2017 at 11:16 AM

## 2017-08-29 NOTE — Progress Notes (Signed)
Outpatient [place] VISIT    NAME:  Linda [name]  DATE OF BIRTH:  04-18-1946  MEDICAL RECORD NUMBER:  [id number]  EPISODE DATE:  08/29/2017    Patient arrived to [facility]   []  per wheelchair   [x]  ambulatory     Has the patient had a previous problem with Injectafer or Iron Infusions? No  Any current infection or illness? No   Patient on antibiotics? No   History of Hypertension? No    Is the patient experiencing any:  Fatigue:   []    None  []    Increase over baseline but not altering normal activities  [x]    Moderate of causing difficulty performing some activities  []    Severe or loss of ability to perform some activities  []    Bedridden or disabling     Dizziness or Lightheadedness:   []    None  [x]    No Interference  []    Interferes with functioning but not activities of daily living  []    Interferes with daily activies  []    Bedridden or disabling     Shortness of Breath:   []    None   [x]    Dyspneic on exertion   []    Dyspnea with normal activities  []    Dyspnea at rest    Edema: none Location of edema:     Tachycardia: No    Heart Palpitations: No      Chest Pain: No      BP (!) 117/58   Pulse 51   Temp 98 F (36.7 C) (Oral)   Resp 15   SpO2 98%   Patient Vitals for the past 2 hrs:   BP Temp Temp src Pulse Resp SpO2   08/29/17 1204 (!) 117/58 - - 51 15 -   08/29/17 1045 112/61 98 F (36.7 C) Oral (!) 48 16 98 %       Current Lab Data:  Hemoglobin/Hematocrit:    Lab Results   Component Value Date    HGB 9.2 08/28/2017    HCT 30.7 08/28/2017     IRON:    Lab Results   Component Value Date    IRON 71 08/05/2017     Iron Saturation:    Lab Results   Component Value Date    LABIRON 15 08/05/2017     TIBC:    Lab Results   Component Value Date    TIBC 486 08/05/2017     FERRITIN:    Lab Results   Component Value Date    FERRITIN 4.8 08/05/2017       Dose Administered: 750 mg  Todays dose is number 2 out of2 ordered for this  patient.    Response to treatment:  Well tolerated by patient.    Education:    Verbalized understanding    This was patient's last dose     Electronically signed by Kandee Keen, RN on 08/29/2017 at 12:10 PM

## 2017-09-02 ENCOUNTER — Encounter: Attending: Internal Medicine | Primary: Internal Medicine

## 2017-09-05 NOTE — Telephone Encounter (Signed)
Dr. Garen Grams per Infusion form scanned in media only ordered 2 iron infusions. Last infusion was on 1.24.19.    Per Dr. Tamala Julian patient will need a HGB prior to him considering a 3rd infusion.     I called patient and she stated she will wait for Dr. Garen Grams to return and she has an appt on 2.12.19 with Dr. Garen Grams to review. She is putting off the surgery as it is for the next 6-8 weeks in order to care for her husband.

## 2017-09-05 NOTE — Telephone Encounter (Signed)
Patients last Hgb was 9.2 on 08/28/2017. Can we order a 3rd infusion?

## 2017-09-05 NOTE — Telephone Encounter (Signed)
I know nothing about this.  Please clarify.  Did Dr. Garen Grams actually order an iron infusion?

## 2017-09-05 NOTE — Telephone Encounter (Signed)
Patient is calling she just had her second IRON infusion and she states that Dr. Garen Grams told her she wanted her to have 3 IRON infusions and the infusion center told her she would need another order for another infusion    Please Advise   CB# [phone]

## 2017-09-08 MED ORDER — PAROXETINE HCL 20 MG PO TABS
20 MG | ORAL_TABLET | ORAL | 1 refills | Status: DC
Start: 2017-09-08 — End: 2017-11-10

## 2017-09-17 ENCOUNTER — Ambulatory Visit: Admit: 2017-09-17 | Discharge: 2017-09-17 | Payer: MEDICARE | Attending: Internal Medicine | Primary: Internal Medicine

## 2017-09-17 ENCOUNTER — Encounter

## 2017-09-17 DIAGNOSIS — D500 Iron deficiency anemia secondary to blood loss (chronic): Secondary | ICD-10-CM

## 2017-09-17 LAB — CBC WITH AUTO DIFFERENTIAL
Basophils %: 0.8 %
Basophils Absolute: 0 10*3/uL (ref 0.0–0.2)
Eosinophils %: 2.3 %
Eosinophils Absolute: 0.1 10*3/uL (ref 0.0–0.6)
Hematocrit: 35.2 % — ABNORMAL LOW (ref 36.0–48.0)
Hemoglobin: 10.8 g/dL — ABNORMAL LOW (ref 12.0–16.0)
Lymphocytes %: 36.1 %
Lymphocytes Absolute: 2 10*3/uL (ref 1.0–5.1)
MCH: 26.2 pg (ref 26.0–34.0)
MCHC: 30.7 g/dL — ABNORMAL LOW (ref 31.0–36.0)
MCV: 85.5 fL (ref 80.0–100.0)
MPV: 8 fL (ref 5.0–10.5)
Monocytes %: 8.5 %
Monocytes Absolute: 0.5 10*3/uL (ref 0.0–1.3)
Neutrophils %: 52.3 %
Neutrophils Absolute: 2.8 10*3/uL (ref 1.7–7.7)
Platelets: 287 10*3/uL (ref 135–450)
RBC: 4.12 M/uL (ref 4.00–5.20)
RDW: 36.7 % — ABNORMAL HIGH (ref 12.4–15.4)
WBC: 5.4 10*3/uL (ref 4.0–11.0)

## 2017-09-17 LAB — FERRITIN: Ferritin: 531.4 ng/mL — ABNORMAL HIGH (ref 15.0–150.0)

## 2017-09-17 LAB — T4, FREE: T4 Free: 0.8 ng/dL — ABNORMAL LOW (ref 0.9–1.8)

## 2017-09-17 LAB — T3, FREE: T3, Free: 2.6 pg/mL (ref 2.3–4.2)

## 2017-09-17 LAB — TSH: TSH: 9.98 u[IU]/mL — ABNORMAL HIGH (ref 0.27–4.20)

## 2017-09-17 NOTE — Progress Notes (Signed)
OUTPATIENT PROGRESS NOTE  Date of Service:  09/17/2017  Address: [facility]  [place] [address]  Dept: [phone]  Loc: [phone]    Subjective:     Chief Complaint   Patient presents with   . Abnormal Test Results     PatientID: [id number]  Linda [name] is a 72 y.o. female  (Location/Symptom, Timing/Onset, Context/Setting, Quality, Duration, Modifying Factors, Severity)  HPIwith chronic daily migraines, iron deficiency severe, hyperthyroidism who is here for recheck on her labs.  Her surgery has been postponed because her husband fell and broke his shoulder.  She is still on the meds as discussed.    Allergies   Allergen Reactions   . Actonel [Risedronate Sodium]      Osteonecrosis jaw documented     Current Outpatient Prescriptions   Medication Sig Dispense Refill   . PARoxetine (PAXIL) 20 MG tablet TAKE 1 TABLET BY MOUTH EVERY DAY 90 tablet 1   . divalproex (DEPAKOTE ER) 500 MG extended release tablet TAKE 2 TABLET BY MOUTH AT NIGHT FOR MIGRAINE PREVENTION IN ADDITION TO NADOLOL 90 tablet 2   . nadolol (CORGARD) 20 MG tablet TAKE 1/2 TABLET BY MOUTH DAILY 90 tablet 3   . methimazole (TAPAZOLE) 5 MG tablet Take 5 mg by mouth 2 times daily     . Polysaccharide Iron Complex 391.3 (180 Fe) MG CAPS One a day for six months 30 capsule 5   . Cobalamine Combinations (B12 FOLATE) 800-800 MCG CAPS One a day indefinitely 30 capsule 3   . lansoprazole (PREVACID) 30 MG capsule Take 1 capsule by mouth daily. 30 capsule 11   . Calcium Carbonate-Vitamin D (CALTRATE 600+D) 600-400 MG-UNIT CHEW Take 1 tablet by mouth 2 times daily. 60 tablet 11     No current facility-administered medications for this visit.      Past Medical History:   Diagnosis Date   . 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 Colon polyps     adenomatous    . Dyspepsia     nonulcerative   . Graves  disease    . High cholesterol    . History of depression    . Hypertension    . Iron deficiency anemia     has had gi work up [employer]   . 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 Migraine     recurrent, controlled with elavil   . Osteoarthritis of lumbar spine     [place]   . Other intestinal malabsorption 08/09/2017   . Postmenopausal    . Thoracic compression fracture ([facility])     T10 from a fall     Past Surgical History:   Procedure Laterality Date   . ARTERY BIOPSY Right 12-24-12    Temporal Artery biopsy right    . COLONOSCOPY  [id number]    dr Awanda Mink   3 years   . COLONOSCOPY N/A 07/09/2017    COLONOSCOPY POLYPECTOMY SNARE performed by Orvan Seen, MD at [facility]   . COLONOSCOPY N/A 07/09/2017    COLONOSCOPY WITH BIOPSY performed by Orvan Seen, MD at [facility]   . ESOPHAGEAL MOTILITY STUDY N/A 08/27/2017    ESOPHAGEAL MANOMETRY performed by Orvan Seen, MD  at [facility]   . KYPHOSIS SURGERY      t10   . SHOULDER SURGERY      left humerus fx 3 places   . UPPER GASTROINTESTINAL ENDOSCOPY  08/26/2014    dr Shanon Brow hess:negative   . UPPER GASTROINTESTINAL ENDOSCOPY N/A 08/08/2017    EGD ESOPHAGOGASTRODUODENOSCOPY performed by Orvan Seen, MD at [facility] History   Substance Use Topics   . Smoking status: Current Some Day Smoker     Packs/day: 0.50     Years: 40.00     Last attempt to quit: 08/06/1997   . Smokeless tobacco: Never Used      Comment: passive exposure husband chain smokes   . Alcohol use 0.0 oz/week      Comment: rarely     Family History   Problem Relation Age of Onset   . Migraines Other    . Diabetes Other    . Breast Cancer Sister    . Cancer Mother    . Cancer Father           Review of Systems   All other systems reviewed and are negative.      Objective:   Physical Exam   Constitutional: She is oriented to person, place, and time. She appears well-developed and  well-nourished. No distress.   HENT:   Head: Normocephalic and atraumatic.   Right Ear: External ear normal.   Left Ear: External ear normal.   Nose: Nose normal.   Mouth/Throat: Oropharynx is clear and moist.   Eyes: Pupils are equal, round, and reactive to light. EOM are normal. Right eye exhibits no discharge. Left eye exhibits no discharge.   Neck: Normal range of motion. Neck supple. No JVD present. No tracheal deviation present. No thyromegaly present.   Cardiovascular: Normal rate, regular rhythm and normal heart sounds.  Exam reveals no gallop.    No murmur heard.  Pulmonary/Chest: Effort normal and breath sounds normal. No stridor. No respiratory distress.   Abdominal: Soft. Bowel sounds are normal. She exhibits no distension. There is no tenderness.   Musculoskeletal: Normal range of motion. She exhibits no edema.   Lymphadenopathy:     She has no cervical adenopathy.   Neurological: She is alert and oriented to person, place, and time. She has normal reflexes.   Skin: Skin is warm and dry. She is not diaphoretic.   Psychiatric: She has a normal mood and affect. Her behavior is normal. Judgment and thought content normal.   Nursing note and vitals reviewed.        Assessment/Plan     1.  Chronic blood loss iron deficiency anemia  Recheck labs today  Continue iron po    2  Graves hyperthyroidism on one tapazole a day  And pulse better dtrs normal  Plan recheck tft's adjust slowly    3.  Hiatal hernia/cammeron ulcers  Plan :  Stay on protonix               Angelica Pou, MD

## 2017-10-01 ENCOUNTER — Encounter: Payer: MEDICARE | Attending: Internal Medicine | Primary: Internal Medicine

## 2017-10-17 NOTE — Telephone Encounter (Signed)
Notified pt that she will need to have another pre op from Dr. Garen Grams since it has been pass 30 days since her last one. Informed pt to give our office a callback after getting clearance from PCP and we will go ahead and get her scheduled for surgery.

## 2017-10-17 NOTE — Telephone Encounter (Signed)
PT is waiting to hear when her surgery will be

## 2017-10-21 ENCOUNTER — Encounter

## 2017-10-21 ENCOUNTER — Ambulatory Visit: Admit: 2017-10-21 | Discharge: 2017-10-21 | Payer: MEDICARE | Attending: Internal Medicine | Primary: Internal Medicine

## 2017-10-21 DIAGNOSIS — D500 Iron deficiency anemia secondary to blood loss (chronic): Secondary | ICD-10-CM

## 2017-10-21 LAB — CBC
Hematocrit: 35.4 % — ABNORMAL LOW (ref 36.0–48.0)
Hemoglobin: 11.5 g/dL — ABNORMAL LOW (ref 12.0–16.0)
MCH: 30.2 pg (ref 26.0–34.0)
MCHC: 32.4 g/dL (ref 31.0–36.0)
MCV: 93.3 fL (ref 80.0–100.0)
MPV: 7.8 fL (ref 5.0–10.5)
Platelets: 228 10*3/uL (ref 135–450)
RBC: 3.79 M/uL — ABNORMAL LOW (ref 4.00–5.20)
RDW: 28.1 % — ABNORMAL HIGH (ref 12.4–15.4)
WBC: 4.4 10*3/uL (ref 4.0–11.0)

## 2017-10-21 LAB — T4, FREE: T4 Free: 0.6 ng/dL — ABNORMAL LOW (ref 0.9–1.8)

## 2017-10-21 LAB — FERRITIN: Ferritin: 158.1 ng/mL — ABNORMAL HIGH (ref 15.0–150.0)

## 2017-10-21 LAB — T3, FREE: T3, Free: 2.5 pg/mL (ref 2.3–4.2)

## 2017-10-21 LAB — TSH: TSH: 10.57 u[IU]/mL — ABNORMAL HIGH (ref 0.27–4.20)

## 2017-10-21 NOTE — Progress Notes (Signed)
OUTPATIENT PROGRESS NOTE  Date of Service:  10/21/2017  Address: [facility]  [place] [address]  Dept: [phone]  Loc: [phone]    Subjective:     Chief Complaint   Patient presents with   ??? Anemia     PatientID: [id number]  Linda [name] is a 72 y.o. female  (Location/Symptom, Timing/Onset, Context/Setting, Quality, Duration, Modifying Factors, Severity)  HPIwith migraines,graves hyperthyroidism, iron deficiency anemia from Cammeron's ulcers/HH who has decided to put off the surgery for the hiatal hernia for now and to move to [place] where she will pursue it instead.  She has no black or bloody stool, only goes 3 x per week . Meds reviewed only never increased methimazole 5mg  bid.  Gets a little heartburn at night  Allergies   Allergen Reactions   ??? Actonel [Risedronate Sodium]      Osteonecrosis jaw documented     Current Outpatient Medications   Medication Sig Dispense Refill   ??? PARoxetine (PAXIL) 20 MG tablet TAKE 1 TABLET BY MOUTH EVERY DAY 90 tablet 1   ??? divalproex (DEPAKOTE ER) 500 MG extended release tablet TAKE 2 TABLET BY MOUTH AT NIGHT FOR MIGRAINE PREVENTION IN ADDITION TO NADOLOL 90 tablet 2   ??? nadolol (CORGARD) 20 MG tablet TAKE 1/2 TABLET BY MOUTH DAILY 90 tablet 3   ??? methimazole (TAPAZOLE) 5 MG tablet Take 5 mg by mouth 2 times daily     ??? Polysaccharide Iron Complex 391.3 (180 Fe) MG CAPS One a day for six months 30 capsule 5   ??? Cobalamine Combinations (B12 FOLATE) 800-800 MCG CAPS One a day indefinitely 30 capsule 3   ??? lansoprazole (PREVACID) 30 MG capsule Take 1 capsule by mouth daily. 30 capsule 11   ??? Calcium Carbonate-Vitamin D (CALTRATE 600+D) 600-400 MG-UNIT CHEW Take 1 tablet by mouth 2 times daily. 60 tablet 11     No current facility-administered medications for this visit.      Past Medical History:   Diagnosis Date   ??? Bisphosphonate-associated osteonecrosis of the jaw [facility])    ??? Celiac artery  atherosclerosis 2015    60% proximal stenosis   ??? Colon polyps     adenomatous    ??? Dyspepsia     nonulcerative   ??? Graves disease    ??? High cholesterol    ??? History of depression    ??? Hypertension    ??? Iron deficiency anemia     has had gi work up [employer]   ??? 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 Migraine     recurrent, controlled with elavil   ??? Osteoarthritis of lumbar spine     [place]   ??? 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 Postmenopausal    ??? Thoracic compression fracture ([facility])     T10 from a fall     Past Surgical History:   Procedure Laterality Date   ??? ARTERY BIOPSY Right 12-24-12    Temporal Artery biopsy right    ??? COLONOSCOPY  [id number]    dr Awanda Mink   3 years   ??? COLONOSCOPY N/A 07/09/2017    COLONOSCOPY POLYPECTOMY SNARE performed by Orvan Seen, MD at [facility]   ??? COLONOSCOPY N/A 07/09/2017    COLONOSCOPY WITH BIOPSY performed by [facility]en, MD at [facility]  [facility]   ??? ESOPHAGEAL MOTILITY STUDY N/A 08/27/2017    ESOPHAGEAL MANOMETRY performed by Orvan Seen, MD at [facility]   ??? KYPHOSIS SURGERY      t10   ??? SHOULDER SURGERY      left humerus fx 3 places   ??? UPPER GASTROINTESTINAL ENDOSCOPY  08/26/2014    dr Shanon Brow hess:negative   ??? UPPER GASTROINTESTINAL ENDOSCOPY N/A 08/08/2017    EGD ESOPHAGOGASTRODUODENOSCOPY performed by Orvan Seen, MD at [facility] History     Tobacco Use   ??? Smoking status: Current Some Day Smoker     Packs/day: 0.50     Years: 40.00     Pack years: 20.00     Last attempt to quit: 08/06/1997     Years since quitting: 20.2   ??? Smokeless tobacco: Never Used   ??? Tobacco comment: passive exposure husband chain smokes   Substance Use Topics   ??? Alcohol use: Yes     Alcohol/week: 0.0 oz     Comment: rarely   ??? Drug use: No     Family History   Problem Relation Age of Onset   ??? Migraines Other    ??? Diabetes Other    ??? Breast Cancer Sister    ??? Cancer Mother     ??? Cancer Father           Review of Systems    Objective:   Physical Exam  Color ok not pale conjunctiva  Chest ctap  Cardio RRR sys murmur  abdo soft nontender  Ext wo edema    Assessment/Plan     1.  Iron def anemia: blood loss anemia  Check cbc and ferritin    2. Hyperthyroidism:  Should be better   Check   TFT                Angelica Pou, MD

## 2017-10-22 ENCOUNTER — Encounter

## 2017-10-22 NOTE — Telephone Encounter (Signed)
Pt returning calls about labs    [phone]

## 2017-10-22 NOTE — Telephone Encounter (Signed)
LAB RESULTS GIVEN. LAB ORDER PLACED

## 2017-11-11 MED ORDER — PAROXETINE HCL 20 MG PO TABS
20 MG | ORAL_TABLET | ORAL | 1 refills | Status: DC
Start: 2017-11-11 — End: 2017-12-24

## 2017-11-19 ENCOUNTER — Encounter

## 2017-11-20 LAB — TSH: TSH: 4.39 u[IU]/mL — ABNORMAL HIGH (ref 0.27–4.20)

## 2017-11-20 NOTE — Telephone Encounter (Signed)
error 

## 2017-11-21 ENCOUNTER — Emergency Department: Admit: 2017-11-21 | Payer: MEDICARE | Primary: Internal Medicine

## 2017-11-21 ENCOUNTER — Inpatient Hospital Stay: Admit: 2017-11-21 | Payer: MEDICARE | Primary: Internal Medicine

## 2017-11-21 ENCOUNTER — Inpatient Hospital Stay
Admission: EM | Admit: 2017-11-21 | Discharge: 2017-11-24 | Disposition: A | Discharge status: Rehabilitation Facility | Payer: Medicare Other | Source: Other Acute Inpatient Hospital | Admitting: Internal Medicine

## 2017-11-21 DIAGNOSIS — S72144A Nondisplaced intertrochanteric fracture of right femur, initial encounter for closed fracture: Principal | ICD-10-CM

## 2017-11-21 LAB — CBC WITH AUTO DIFFERENTIAL
Basophils %: 0.5 %
Basophils Absolute: 0.1 10*3/uL (ref 0.0–0.2)
Eosinophils %: 0.6 %
Eosinophils Absolute: 0.1 10*3/uL (ref 0.0–0.6)
Hematocrit: 38.5 % (ref 36.0–48.0)
Hemoglobin: 12.6 g/dL (ref 12.0–16.0)
Lymphocytes %: 15.8 %
Lymphocytes Absolute: 1.9 10*3/uL (ref 1.0–5.1)
MCH: 31.9 pg (ref 26.0–34.0)
MCHC: 32.6 g/dL (ref 31.0–36.0)
MCV: 97.7 fL (ref 80.0–100.0)
MPV: 7.5 fL (ref 5.0–10.5)
Monocytes %: 4.4 %
Monocytes Absolute: 0.5 10*3/uL (ref 0.0–1.3)
Neutrophils %: 78.7 %
Neutrophils Absolute: 9.5 10*3/uL — ABNORMAL HIGH (ref 1.7–7.7)
Platelets: 188 10*3/uL (ref 135–450)
RBC Morphology: NORMAL
RBC: 3.95 M/uL — ABNORMAL LOW (ref 4.00–5.20)
RDW: 13.8 % (ref 12.4–15.4)
WBC: 12 10*3/uL — ABNORMAL HIGH (ref 4.0–11.0)

## 2017-11-21 LAB — TSH: TSH: 6.33 u[IU]/mL — ABNORMAL HIGH (ref 0.27–4.20)

## 2017-11-21 LAB — EKG 12-LEAD
Atrial Rate: 74 {beats}/min
P Axis: 58 degrees
P-R Interval: 180 ms
Q-T Interval: 400 ms
QRS Duration: 76 ms
QTc Calculation (Bazett): 444 ms
R Axis: 44 degrees
T Axis: 57 degrees
Ventricular Rate: 74 {beats}/min

## 2017-11-21 LAB — BASIC METABOLIC PANEL W/ REFLEX TO MG FOR LOW K
Anion Gap: 13 (ref 3–16)
BUN: 10 mg/dL (ref 7–20)
CO2: 25 mmol/L (ref 21–32)
Calcium: 9.1 mg/dL (ref 8.3–10.6)
Chloride: 101 mmol/L (ref 99–110)
Creatinine: 0.7 mg/dL (ref 0.6–1.2)
GFR African American: >60 (ref >60)
GFR Non-African American: >60 (ref >60)
Glucose: 130 mg/dL — ABNORMAL HIGH (ref 70–99)
Potassium reflex Magnesium: 3.7 mmol/L (ref 3.5–5.1)
Sodium: 139 mmol/L (ref 136–145)

## 2017-11-21 LAB — APTT: aPTT: 28.3 s (ref 26.0–36.0)

## 2017-11-21 LAB — T4, FREE: T4 Free: 0.9 ng/dL (ref 0.9–1.8)

## 2017-11-21 LAB — T3, FREE: T3, Free: 2.8 pg/mL (ref 2.3–4.2)

## 2017-11-21 LAB — TYPE AND SCREEN
ABO/Rh: O POS
Antibody Screen: NEGATIVE

## 2017-11-21 LAB — PROTIME-INR
INR: 0.98 (ref 0.86–1.14)
Protime: 11.2 s (ref 9.8–13.0)

## 2017-11-21 MED ORDER — CEFAZOLIN 2000 MG IN D5W 100 ML IVPB
Status: DC
Start: 2017-11-21 — End: 2017-11-21

## 2017-11-21 MED ORDER — BUPIVACAINE HCL 0.5 % IJ SOLN
0.5 % | Freq: Once | INTRAMUSCULAR | Status: DC
Start: 2017-11-21 — End: 2017-11-21

## 2017-11-21 MED ORDER — DIVALPROEX SODIUM ER 500 MG PO TB24
500 MG | Freq: Every evening | ORAL | Status: DC
Start: 2017-11-21 — End: 2017-11-24
  Administered 2017-11-22 – 2017-11-24 (×3): 500 mg via ORAL

## 2017-11-21 MED ORDER — MORPHINE SULFATE (PF) 2 MG/ML IV SOLN
2 MG/ML | Freq: Once | INTRAVENOUS | Status: AC
Start: 2017-11-21 — End: 2017-11-21
  Administered 2017-11-21: 05:00:00 4 mg via INTRAVENOUS

## 2017-11-21 MED ORDER — SODIUM CHLORIDE 0.9 % IV BOLUS
0.9 % | Freq: Once | INTRAVENOUS | Status: AC
Start: 2017-11-21 — End: 2017-11-21
  Administered 2017-11-21: 05:00:00 1000 mL via INTRAVENOUS

## 2017-11-21 MED ORDER — CALCIUM CARBONATE-VITAMIN D 500-200 MG-UNIT PO TABS
500-200 MG-UNIT | Freq: Two times a day (BID) | ORAL | Status: DC
Start: 2017-11-21 — End: 2017-11-24
  Administered 2017-11-21 – 2017-11-24 (×4): 1 via ORAL

## 2017-11-21 MED ORDER — DIVALPROEX SODIUM ER 500 MG PO TB24
500 MG | Freq: Every evening | ORAL | Status: DC
Start: 2017-11-21 — End: 2017-11-21

## 2017-11-21 MED ORDER — SODIUM CHLORIDE 0.9 % IV SOLN
0.9 % | INTRAVENOUS | Status: DC
Start: 2017-11-21 — End: 2017-11-22
  Administered 2017-11-21 (×2): via INTRAVENOUS

## 2017-11-21 MED ORDER — HYDROMORPHONE HCL 2 MG PO TABS
2 MG | Freq: Four times a day (QID) | ORAL | Status: DC | PRN
Start: 2017-11-21 — End: 2017-11-22
  Administered 2017-11-21: 17:00:00 2 mg via ORAL

## 2017-11-21 MED ORDER — NADOLOL 40 MG PO TABS
40 MG | Freq: Every evening | ORAL | Status: DC
Start: 2017-11-21 — End: 2017-11-24
  Administered 2017-11-22 – 2017-11-24 (×3): 10 mg via ORAL

## 2017-11-21 MED ORDER — FENTANYL CITRATE (PF) 100 MCG/2ML IJ SOLN
100 MCG/2ML | Freq: Once | INTRAMUSCULAR | Status: AC
Start: 2017-11-21 — End: 2017-11-21
  Administered 2017-11-21: 05:00:00 50 ug via INTRAVENOUS

## 2017-11-21 MED ORDER — HYDROMORPHONE HCL 1 MG/ML IJ SOLN
1 MG/ML | INTRAMUSCULAR | Status: DC | PRN
Start: 2017-11-21 — End: 2017-11-22
  Administered 2017-11-21 – 2017-11-22 (×7): 1 mg via INTRAVENOUS

## 2017-11-21 MED ORDER — PAROXETINE HCL 20 MG PO TABS
20 MG | Freq: Every day | ORAL | Status: DC
Start: 2017-11-21 — End: 2017-11-24
  Administered 2017-11-21 – 2017-11-24 (×4): 20 mg via ORAL

## 2017-11-21 MED ORDER — HYDROMORPHONE HCL 1 MG/ML IJ SOLN
1 MG/ML | Freq: Once | INTRAMUSCULAR | Status: AC
Start: 2017-11-21 — End: 2017-11-21
  Administered 2017-11-21: 06:00:00 1 mg via INTRAVENOUS

## 2017-11-21 MED ORDER — HYDROXYZINE PAMOATE 25 MG PO CAPS
25 MG | ORAL | Status: DC | PRN
Start: 2017-11-21 — End: 2017-11-24
  Administered 2017-11-21 – 2017-11-23 (×2): 25 mg via ORAL

## 2017-11-21 MED ORDER — POLYSACCHARIDE IRON COMPLEX 150 MG PO CAPS
150 MG | Freq: Every day | ORAL | Status: DC
Start: 2017-11-21 — End: 2017-11-24
  Administered 2017-11-21 – 2017-11-24 (×4): 150 via ORAL

## 2017-11-21 MED ORDER — OXYCODONE-ACETAMINOPHEN 5-325 MG PO TABS
5-325 MG | ORAL | Status: DC | PRN
Start: 2017-11-21 — End: 2017-11-21

## 2017-11-21 MED ORDER — OXYCODONE-ACETAMINOPHEN 5-325 MG PO TABS
5-325 MG | ORAL | Status: DC | PRN
Start: 2017-11-21 — End: 2017-11-21
  Administered 2017-11-21: 14:00:00 2 via ORAL

## 2017-11-21 MED ORDER — ONDANSETRON HCL 4 MG/2ML IJ SOLN
4 MG/2ML | Freq: Once | INTRAMUSCULAR | Status: AC
Start: 2017-11-21 — End: 2017-11-21
  Administered 2017-11-21: 05:00:00 4 mg via INTRAVENOUS

## 2017-11-21 MED ORDER — PANTOPRAZOLE SODIUM 40 MG PO TBEC
40 MG | Freq: Every day | ORAL | Status: DC
Start: 2017-11-21 — End: 2017-11-24
  Administered 2017-11-21 – 2017-11-24 (×3): 40 mg via ORAL

## 2017-11-21 MED ORDER — METHIMAZOLE 5 MG PO TABS
5 MG | Freq: Every day | ORAL | Status: DC
Start: 2017-11-21 — End: 2017-11-24
  Administered 2017-11-22 – 2017-11-24 (×3): 5 mg via ORAL

## 2017-11-21 MED ORDER — METHIMAZOLE 5 MG PO TABS
5 MG | Freq: Two times a day (BID) | ORAL | Status: DC
Start: 2017-11-21 — End: 2017-11-21

## 2017-11-21 MED FILL — HYDROMORPHONE HCL 1 MG/ML IJ SOLN: 1 mg/mL | INTRAMUSCULAR | Qty: 1

## 2017-11-21 MED FILL — HYDROXYZINE PAMOATE 25 MG PO CAPS: 25 mg | ORAL | Qty: 1

## 2017-11-21 MED FILL — HYDROMORPHONE HCL 2 MG PO TABS: 2 mg | ORAL | Qty: 1

## 2017-11-21 MED FILL — FENTANYL CITRATE (PF) 100 MCG/2ML IJ SOLN: 100 MCG/2ML | INTRAMUSCULAR | Qty: 2

## 2017-11-21 MED FILL — PAROXETINE HCL 20 MG PO TABS: 20 mg | ORAL | Qty: 1

## 2017-11-21 MED FILL — OYSTER SHELL CALCIUM/D 500-200 MG-UNIT PO TABS: 500-200 MG-UNIT | ORAL | Qty: 1

## 2017-11-21 MED FILL — SENSORCAINE 0.5 % IJ SOLN: 0.5 % | INTRAMUSCULAR | Qty: 50

## 2017-11-21 MED FILL — PANTOPRAZOLE SODIUM 40 MG PO TBEC: 40 mg | ORAL | Qty: 1

## 2017-11-21 MED FILL — OXYCODONE-ACETAMINOPHEN 5-325 MG PO TABS: 5-325 mg | ORAL | Qty: 2

## 2017-11-21 MED FILL — FERREX 150 150 MG PO CAPS: 150 mg | ORAL | Qty: 1

## 2017-11-21 MED FILL — ONDANSETRON HCL 4 MG/2ML IJ SOLN: 4 MG/2ML | INTRAMUSCULAR | Qty: 2

## 2017-11-21 MED FILL — MORPHINE SULFATE 2 MG/ML IJ SOLN: 2 mg/mL | INTRAMUSCULAR | Qty: 2

## 2017-11-21 NOTE — H&P (Signed)
Active Problems:    Nondisplaced intertrochanteric fracture of right femur, init (HCC)    Closed fracture of right wrist    Iron deficiency anemia secondary to blood loss (chronic)    Senile osteoporosis    GERD (gastroesophageal reflux disease)    Graves disease    History and Physical Dictated, #   [id number]    Time > 35 minutes reviewing chart and patient data, examining and interviewing patient, and discussing with nursing staff, family, etc.

## 2017-11-21 NOTE — Progress Notes (Signed)
Pt resting in bed at beginning of shift. She c/o R hip and wrist pain rated 10/10. She stated the IV Dilaudid only helped for a short period. No PO PRN pain medication ordered at this time. MD notified. Pulses palpable, skin warm to touch. She has weak grip strength in R hand and moderate flexion in RLE. Ace wrap and splint in place to RUE. Surgery planned for tomorrow, pt aware. Fall precautions in place, belongings within reach. Will continue to monitor and assess.

## 2017-11-21 NOTE — Consults (Signed)
[facility]  Consult Note    This patient is seen in consultation at the request of Dr Derwood Kaplan    Reason for Consult:  Right wrist pain, right hip pain    CHIEF COMPLAINT:  Right wrist pain, right hip pain    History Obtained From:  patient, electronic medical record    HISTORY OF PRESENT ILLNESS:    The patient is a 72 y.o. female who presents with right hip and right wrist pain. She states she fell down her basemsnt stairs yesterday. She had right hip, right wrist and forehead pain. She came to ER and is noted with right distal radius fx which was reduced in ER, right IT hip fx, forehead contusion. Pain is described in right wrist more than right hip and with the intensity of moderate. Pain is described as aching, throbbing. Discomfort is constant. She is alert and oriented. Pleasant. No other complaints at this time    Past Medical History:        Diagnosis Date   ??? Bisphosphonate-associated osteonecrosis of the jaw [facility])    ??? Celiac artery atherosclerosis 2015    60% proximal stenosis   ??? Colon polyps     adenomatous    ??? Dyspepsia     nonulcerative   ??? Graves disease    ??? High cholesterol    ??? History of depression    ??? Hypertension    ??? 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 Migraine     recurrent, controlled with elavil   ??? Osteoarthritis of lumbar spine     [place]   ??? 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 Postmenopausal    ??? Thoracic compression fracture ([place])     T10 from a fall       Past Surgical History:        Procedure Laterality Date   ??? ARTERY BIOPSY Right 12-24-12    Temporal Artery biopsy right    ??? COLONOSCOPY  [id number]    dr Awanda Mink   3 years   ??? COLONOSCOPY N/A 07/09/2017    COLONOSCOPY POLYPECTOMY SNARE performed by Orvan Seen, MD at [place]   ??? COLONOSCOPY N/A 07/09/2017    COLONOSCOPY WITH BIOPSY performed by Orvan Seen, MD at [place]   ??? ESOPHAGEAL  MOTILITY STUDY N/A 08/27/2017    ESOPHAGEAL MANOMETRY performed by Orvan Seen, MD at [place]   ??? FIXATION KYPHOPLASTY      t10   ??? SHOULDER SURGERY      left humerus fx 3 places   ??? UPPER GASTROINTESTINAL ENDOSCOPY  08/26/2014    dr Shanon Brow hess:negative   ??? UPPER GASTROINTESTINAL ENDOSCOPY N/A 08/08/2017    EGD ESOPHAGOGASTRODUODENOSCOPY performed by Orvan Seen, MD at [place] History     Tobacco Use   ??? Smoking status: Current Some Day Smoker     Packs/day: 0.25     Years: 40.00     Pack years: 10.00     Last attempt to quit: 08/06/1997     Years since quitting: 20.3   ??? Smokeless tobacco: Never Used   ??? Tobacco comment: passive exposure husband chain smokes   Substance Use Topics   ??? Alcohol use: Yes     Alcohol/week: 0.0 oz     Comment: rarely  Family History   Problem Relation Age of Onset   ??? Migraines Other    ??? Diabetes Other    ??? Breast Cancer Sister    ??? Cancer Mother    ??? Cancer Father            Current Medications:   Current Facility-Administered Medications: HYDROmorphone (DILAUDID) injection 1 mg, 1 mg, Intravenous, Q3H PRN  0.9 % sodium chloride infusion, , Intravenous, Continuous  calcium-vitamin D (OSCAL-500) 500-200 MG-UNIT per tablet 1 tablet, 1 tablet, Oral, BID WC  nadolol (CORGARD) tablet 10 mg, 10 mg, Oral, Nightly  pantoprazole (PROTONIX) tablet 40 mg, 40 mg, Oral, QAM AC  PARoxetine (PAXIL) tablet 20 mg, 20 mg, Oral, Daily  iron polysaccharides (NIFEREX) capsule 150 mg, 1 capsule, Oral, Daily  [START ON 11/22/2017] methimazole (TAPAZOLE) tablet 5 mg, 5 mg, Oral, Daily  divalproex (DEPAKOTE ER) extended release tablet 500 mg, 500 mg, Oral, Nightly  oxyCODONE-acetaminophen (PERCOCET) 5-325 MG per tablet 1 tablet, 1 tablet, Oral, Q4H PRN **OR** oxyCODONE-acetaminophen (PERCOCET) 5-325 MG per tablet 2 tablet, 2 tablet, Oral, Q4H PRN  Allergies:  Actonel [risedronate sodium]    REVIEW OF SYSTEMS:    CONSTITUTIONAL:  negative for  fevers, chills and  malaise  MUSCULOSKELETAL:  positive for  myalgias, arthralgias and pain  All other ROS reviewed in chart or with patient or family and are grossly negative.         PHYSICAL EXAM:    VITALS:  BP 124/80    Pulse 91    Temp 98.9 ??F (37.2 ??C) (Oral)    Resp 16    Ht 5\' 4"  (1.626 m)    Wt 149 lb 11.1 oz (67.9 kg)    SpO2 90%    BMI 25.69 kg/m??     MUSCULOSKELETAL:  Right fingers warm and pink, wiggle to command, brisk cap refill noted. Right wrist in volar splint with ACE. right foot NVI. Wiggles toes to command. Pedal pulses are palpable. Right leg shortened compared to left. Tender right groin, nontender right knee or ankle. Nontender left hip knee or ankle. FROM bilateral elbow and shoulder with no pain or limitations.   NEUROLOGIC:   Sensory:    Touch:                     Right Upper Extremity:  normal                   Left Upper Extremity:  normal                  Right Lower Extremity:  normal                  Left Lower Extremity:  normal  Skin warm and dry  Resp deep and easy  Abdomen soft and round  Pulse is with regular rate and rhythm    DATA:    CBC:   Lab Results   Component Value Date    WBC 12.0 11/21/2017    RBC 3.95 11/21/2017    HGB 12.6 11/21/2017    HCT 38.5 11/21/2017    MCV 97.7 11/21/2017    MCH 31.9 11/21/2017    MCHC 32.6 11/21/2017    RDW 13.8 11/21/2017    PLT 188 11/21/2017    MPV 7.5 11/21/2017     WBC:    Lab Results   Component Value Date    WBC 12.0 11/21/2017     PT/INR:  Lab Results   Component Value Date    PROTIME 11.2 11/21/2017    INR 0.98 11/21/2017     PTT:    Lab Results   Component Value Date    APTT 28.3 11/21/2017   [APTT  Radiology Review:    Narrative   EXAMINATION:   3 XRAY VIEWS OF THE RIGHT WRIST   ??   11/21/2017 3:16 am   ??   COMPARISON:   Right wrist radiographs 11/21/2017.   ??   HISTORY:   ORDERING SYSTEM PROVIDED HISTORY: post reduction   TECHNOLOGIST PROVIDED HISTORY:   Reason for exam:->post reduction   Ordering Physician Provided Reason for Exam: post reduction  rt wrist   Type of Exam: Subsequent/Follow-up   ??   FINDINGS:   Three views provided. ??Casting material in place. ??Decreased bone mineral   density. ??Redemonstration of comminuted intra-articular distal radius   fracture which demonstrates mild impaction. ??There is improved posttraumatic   ulnar plus variance which now neutral and alignment. ??Improved dorsal   angulation with the articular surface now on the neutral position. ??Stable   displaced ulnar styloid process tip fracture.   ??   ??   Impression   Improved alignment of the intra-articular distal radial metaphyseal fracture   with reduction of the posttraumatic ulnar plus variance and neutral position   of the radial articular surface.   ??     Narrative   EXAMINATION:   SINGLE XRAY VIEW OF THE PELVIS AND 2 XRAY VIEWS RIGHT HIP   ??   11/21/2017 12:59 am   ??   COMPARISON:   Abdominal radiograph from upper GI series 08/09/2017   ??   HISTORY:   ORDERING SYSTEM PROVIDED HISTORY: fall, pain   TECHNOLOGIST PROVIDED HISTORY:   Reason for exam:->fall, pain   Ordering Physician Provided Reason for Exam: fell pain rt hip   Acuity: Acute   ??   FINDINGS:   Displaced right femoral intertrochanteric fracture. ??Right femoral head is   otherwise seated anatomically within the acetabulum. ??Intact left hip. ??Bony   pelvis is intact as well. ??Lumbosacral fusion hardware appreciated.   ??   ??   Impression   Right femoral intertrochanteric fracture.   ??           IMPRESSION/RECOMMENDATIONS:    Fall  Right wrist pain, distal radius fx, post reduction in ER. Plan ORIF tomorrow in OR per Dr Barnet Pall  Right hip pain, IT fx, plan ORIF Dr Bridgett Larsson tomorrow in [place] ( same time as Dr Barnet Pall, cosurgeon)  NPO after MN  Discussed with Dr Bridgett Larsson this AM  Hx other fx in past was on bisphosphonate but developed osteonecrosis of the jaw. Now on [place]  11/21/2017  10:31 AM                                                 Orthopaedic Surgery Attending Note      I have personally seen and  examined Janin A Andersson.  I have fully participated in the care of this patient. I have reviewed and agree with all pertinent clinical information including history, physical exam, diagnostic testing and the plan by Suezanne Jacquet, CNP unless otherwise noted.       Briefly, Ms. GRETTELL RANSDELL is a 72 y.o. female who presented  to [facility] ER after she sustained a fall onto her right side.  She is RHD dominant.      General: negative  Psychological: negative  Ophthalmic: negative  ENT: negative  Hematological and Lymphatic: negative  Endocrine: negative  Respiratory: negative  Cardiovascular: negative  Gastrointestinal: negative  Genito-Urinary: negative  Musculoskeletal: negative.  See HPI for pertinent positives.  Neurological: negative  Dermatological: negative     BP 124/80    Pulse 91    Temp 98.9 ??F (37.2 ??C) (Oral)    Resp 16    Ht 5\' 4"  (1.626 m)    Wt 149 lb 11.1 oz (67.9 kg)    SpO2 90%    BMI 25.69 kg/m??   Patient is awake, alert, and in no acute distress.  Right arm in short arm splint.  EPL / FPL / Interossei intact.   SILT fingers.  BCR fingers.    RLE shortened and externally rotated.  Right hip tender.  Dorsiflexion / plantarflexion intact bilateral feet.  Sensation intact to light touch bilateral feet.  BLE WWP.      Secondary skeletal survey negative.  No tenderness to palpation in bilateral upper arms, left leg.  There is no pain with passive motion of neck, shoulders, elbows, left knees bilateral ankles.     Right hip xrays: I independently reviewed the images, as well as the radiology report.   Intertrochanteric hip fracture    Right wrist xrays: I independently reviewed the images, as well as the radiology report.  Displaced intra-articular distal radius fracture.      Assessment:  Right intertrochanteric hip fracture.  Right distal radius fracture      Plan:  I discussed the nature of the fracture with the patient, and treatment options including both surgical and non-surgical  treatment, and that my recommendation would be operative treatment of the fracture, given factors including the location, alignment, amount of displacement and comminution of the fracture.      I had an extensive discussion with Ms. Mora Bellman Steig and/or family regarding the natural history, etiology, and long term consequences of her condition.  I have outlined a treatment plan with them and, in my opinion, surgical intervention is indicated at this time.  I have discussed the potential complications (including, but not limited to injury to nerve or blood vessel, infection, bleeding, DVT or PE, stiffness, incomplete pain relief, need for further surgery, and anesthetic complications), limitations, expectations, alternatives, and risks of the surgical procedure.  We also discussed the importance of postoperative rehabilitation.  She has had full opportunity to ask her questions.  I have answered them all to her satisfaction.  I feel that Ms. Azzie Roup understands our discussion today and she will provide written informed consent for the procedure.      Plan for right hip intramedullary nailing by me, and right wrist ORIF by Dr. Barnet Pall tomorrow.    NPO after midnight.    Pain control.    Bedrest.    Patient has been medically cleared.          Otelia Limes. Bridgett Larsson, MD  Orthopaedic Surgery and Sports Medicine  Electronically signed by Arville Lime, MD on 11/21/2017 at 2:53 PM

## 2017-11-21 NOTE — Care Coordination-Inpatient (Signed)
Received referral and reviewed records.  Will follow to see how she progress with therapy and arrange rehab admit if needed.

## 2017-11-21 NOTE — ED Provider Notes (Addendum)
[facility]  eMERGENCY dEPARTMENT eNCOUnter      Pt Name: Linda [name]  MRN: [id number]  Birthdate 10/03/1945  Date of evaluation: 11/21/2017  Provider: Rueben Bash, DO    CHIEF COMPLAINT       Chief Complaint   Patient presents with   . Fall     right wrist pain, righ t hip pain         HISTORY OF PRESENT ILLNESS   (Location/Symptom, Timing/Onset, Context/Setting, Quality, Duration, Modifying Factors, Severity)  Note limiting factors.   Linda [name] is a 72 y.o. female who presents to the emergency department with complaint of a mechanical fall.  Patient reports she was trying to walk down the steps when she fell forward and rolled down 5 steps.  Reports she hit her head on the wall.  Reports headache, neck pain, right hip pain and right wrist pain.  She is not able to get up and ambulate afterwards secondary to pain.  She called the ambulance who brought her here without event.  She denies being on any anticoagulation or blood thinning medications.  Denies any chest pain, abdominal pain, back pain.  Denies any numbness weakness or tingling in her extremities.      Nursing Notes were reviewed.      PAST MEDICAL HISTORY     Past Medical History:   Diagnosis Date   . Bisphosphonate-associated osteonecrosis of the jaw (HCC)    . 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 Colon polyps     adenomatous    . Dyspepsia     nonulcerative   . Graves disease    . High cholesterol    . History of depression    . Hypertension    . 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 Migraine     recurrent, controlled with elavil   . Osteoarthritis of lumbar spine     [place]   . Other intestinal malabsorption 08/09/2017   . Postmenopausal    . Thoracic compression fracture (HCC)     T10 from a fall         SURGICAL HISTORY       Past Surgical History:   Procedure Laterality Date   . ARTERY BIOPSY Right  12-24-12    Temporal Artery biopsy right    . COLONOSCOPY  [id number]    dr Paulina Fusi   3 years   . COLONOSCOPY N/A 07/09/2017    COLONOSCOPY POLYPECTOMY SNARE performed by Rudell Cobb, MD at [facility] ENDOSCOPY   . COLONOSCOPY N/A 07/09/2017    COLONOSCOPY WITH BIOPSY performed by Rudell Cobb, MD at [facility] ENDOSCOPY   . ESOPHAGEAL MOTILITY STUDY N/A 08/27/2017    ESOPHAGEAL MANOMETRY performed by Rudell Cobb, MD at [facility] ENDOSCOPY   . FIXATION KYPHOPLASTY      t10   . SHOULDER SURGERY      left humerus fx 3 places   . UPPER GASTROINTESTINAL ENDOSCOPY  08/26/2014    dr Onalee Hua hess:negative   . UPPER GASTROINTESTINAL ENDOSCOPY N/A 08/08/2017    EGD ESOPHAGOGASTRODUODENOSCOPY performed by Rudell Cobb, MD at [facility] ENDOSCOPY         CURRENT MEDICATIONS       Current Discharge Medication List      CONTINUE these medications which have NOT CHANGED  Details   divalproex (DEPAKOTE ER) 500 MG extended release tablet Take 500 mg by mouth nightly      PARoxetine (PAXIL) 20 MG tablet TAKE 1 TABLET BY MOUTH EVERY DAY  Qty: 30 tablet, Refills: 1      nadolol (CORGARD) 20 MG tablet TAKE 1/2 TABLET BY MOUTH DAILY  Qty: 90 tablet, Refills: 3    Comments: **Patient requests 90 days supply**  Associated Diagnoses: Elevated BP without diagnosis of hypertension      methimazole (TAPAZOLE) 5 MG tablet Take 5 mg by mouth daily       Polysaccharide Iron Complex 391.3 (180 Fe) MG CAPS One a day for six months  Qty: 30 capsule, Refills: 5      Cobalamine Combinations (B12 FOLATE) 800-800 MCG CAPS One a day indefinitely  Qty: 30 capsule, Refills: 3      Calcium Carbonate-Vitamin D (CALTRATE 600+D) 600-400 MG-UNIT CHEW Take 1 tablet by mouth 2 times daily.  Qty: 60 tablet, Refills: 11      omeprazole (PRILOSEC) 40 MG delayed release capsule Take 40 mg by mouth daily  Refills: 3             ALLERGIES     Actonel [risedronate sodium]    FAMILY HISTORY       Family History   Problem Relation Age of Onset   . Migraines Other    . Diabetes Other    .  Breast Cancer Sister    . Cancer Mother    . Cancer Father           SOCIAL HISTORY       Social History     Socioeconomic History   . Marital status: Married     Spouse name: None   . Number of children: 1   . Years of education: None   . Highest education level: None   Occupational History   . Occupation: secretary/bar tender   Social Needs   . Financial resource strain: None   . Food insecurity:     Worry: None     Inability: None   . Transportation needs:     Medical: None     Non-medical: None   Tobacco Use   . Smoking status: Current Some Day Smoker     Packs/day: 0.25     Years: 40.00     Pack years: 10.00     Last attempt to quit: 08/06/1997     Years since quitting: 20.3   . Smokeless tobacco: Never Used   . Tobacco comment: passive exposure husband chain smokes   Substance and Sexual Activity   . Alcohol use: Yes     Alcohol/week: 0.0 oz     Comment: rarely   . Drug use: No   . Sexual activity: Not Currently   Lifestyle   . Physical activity:     Days per week: None     Minutes per session: None   . Stress: None   Relationships   . Social connections:     Talks on phone: None     Gets together: None     Attends religious service: None     Active member of club or organization: None     Attends meetings of clubs or organizations: None     Relationship status: None   . Intimate partner violence:     Fear of current or ex partner: None     Emotionally abused: None     Physically  abused: None     Forced sexual activity: None   Other Topics Concern   . None   Social History Narrative   . None       SCREENINGS    Glasgow Coma Scale  Eye Opening: Spontaneous  Best Verbal Response: Oriented  Best Motor Response: Obeys commands  Glasgow Coma Scale Score: 15          Review of Systems  Constitutional:  Denies fever, chills  Eyes: denies eye problems  HEENT: Right sided head pain, denies sore throat or ear pain  Respiratory: denies cough or shortness of breath  Cardiovascular: denies chest pain, palpitations  GI:  denies abdominal pain, nausea, vomiting, or diarrhea  Musculoskeletal: Right wrist and right hip pain  Skin: denies rash  Neurologic: denies focal weakness or sensory changes    Except as noted above the remainder of the review of systems was reviewed and negative.       PHYSICAL EXAM    (up to 7 for level 4, 8 or more for level 5)     ED Triage Vitals [11/21/17 0040]   BP Temp Temp Source Pulse Resp SpO2 Height Weight   (!) 94/47 98 F (36.7 C) Oral 65 21 99 % 5\' 4"  (1.626 m) 176 lb 5.9 oz (80 kg)       Constitutional: well-developed, well-nourished, no acute distress, nontoxic appearance  HEENT: Small hematoma noted right parietal region, normocephalic, atraumatic, oropharynx moist, nares patent  Neck: normal range of motion, no tenderness, trachea midline, no stridor  Eyes: PERRLA, EOMI, conjunctiva normal  Respiratory: normal breath sounds, non labored breathing pattern  Cardiovascular: normal heart rate, normal rhythm  GI: bowel sounds normal, soft, nontender, nondistended, no pulsatile masses  GU: deferred  Musculoskeletal: Deformity and right wrist noted, tender to palpation, 2+ radial pulses bilaterally, 5 out of 5 grip strength bilaterally, normal sensation bilateral upper extremities, full range of motion of the right elbow, right hip with tenderness to palpation, no obvious deformity, really slightly shortened however she is bent at the knee, full range of motion in the right knee, 2+ DP and PT pulses bilaterally, normal sensation in bilateral lower extremities, intact distal pulses, no clubbing, cyanosis, or edema.  Good range of motion  Back: No midline tenderness to palpation, no step-off, no deformity  Integument: warm, dry, no erythema, no rash, < 2 second cap refill  Neurologic: alert and oriented 3, no focal deficits appreciated    DIAGNOSTIC RESULTS         RADIOLOGY:   Interpretation per the Radiologist below, if available at the time of this note:    XR WRIST RIGHT (MIN 3 VIEWS)   Final Result    Improved alignment of the intra-articular distal radial metaphyseal fracture   with reduction of the posttraumatic ulnar plus variance and neutral position   of the radial articular surface.         CT CERVICAL SPINE WO CONTRAST   Final Result   No acute abnormality of the cervical spine.      Cervical spondylosis with facet arthrosis.      Interlobular septal thickening at the included lung apices suggests mild   edema.         CT Head WO Contrast   Final Result   Right posterior scalp contusion.  No underlying displaced calvarial fracture   or acute intracranial abnormality.      Age-related cerebral atrophy and chronic microvascular ischemic disease.  XR HIP 2-3 VW W PELVIS RIGHT   Final Result   Right femoral intertrochanteric fracture.         XR CHEST 1 VW   Final Result   No rib fracture identified.  No acute cardiopulmonary findings.      Large gas distended hiatal hernia, increased from prior exam.         XR WRIST RIGHT (MIN 3 VIEWS)   Final Result   Osteopenia with distal radial and ulnar fractures, as described above.               LABS:  Labs Reviewed   CBC WITH AUTO DIFFERENTIAL - Abnormal; Notable for the following components:       Result Value    WBC 12.0 (*)     RBC 3.95 (*)     Neutrophils # 9.5 (*)     All other components within normal limits    Narrative:     Performed at:  [facility]  [address] [place], [place] [address]   Phone [phone]   BASIC METABOLIC PANEL W/ REFLEX TO MG FOR LOW K - Abnormal; Notable for the following components:    Glucose 130 (*)     All other components within normal limits    Narrative:     Performed at:  [facility]  [address] [place], [place] [address]   Phone [phone]   TSH WITHOUT REFLEX - Abnormal; Notable for the following components:    TSH 6.33 (*)     All other components within normal limits    Narrative:     Performed at:  [facility]  [address] [place], [place] [address]   Phone [phone]   PROTIME-INR    Narrative:     Performed at:  [facility]  [address] [place], [place] [address]   Phone [phone]   APTT    Narrative:     Performed at:  [facility]  [address] [place], [place] [address]   Phone [phone]   T4, FREE    Narrative:     Performed at:  [facility]  [address] [place], [place] [address]   Phone [phone]   T3, FREE    Narrative:     Performed at:  [facility]  [address] [place], [place] [address]   Phone [phone]   TYPE AND SCREEN    Narrative:     Performed at:  [facility]  [address] [place], [place] [address]   Phone [phone]       All other labs were within normal range or not returned as of this dictation.    EMERGENCY DEPARTMENT COURSE and DIFFERENTIAL DIAGNOSIS/MDM:   Vitals:    Vitals:    11/21/17 0230 11/21/17 0315 11/21/17 0346 11/21/17 0819   BP: (!) 145/67 (!) 114/54 137/85 124/80   Pulse: 74 68 81 91   Resp: 20 17 18 16    Temp:  98 F (36.7 C) 98.6 F (37 C) 98.9 F (37.2 C)   TempSrc:  Oral Oral Oral   SpO2: 94%   90%   Weight:   149 lb 11.1 oz (67.9 kg)  Height:   5\' 4"  (1.626 m)          MDM  72 year old female presents to the complaint of right hip and right wrist pain.  Also with a right-sided head hematoma.  Vital signs show mildly low blood pressure.  Physical exam as above.  Patient appears in obvious pain.  She has obvious deformity in the right wrist as well as pain to palpation in the right hip.  Thoracic spine and lumbar spine, chest and abdomen clear clinically.  We'll get a CT head, CT C-spine, right hip and right pelvis x-ray.  She is neurovascularly intact in all extremities.    Patient's workup shows white count of 12,000.  Hemoglobin stable.  BMP within  normal limits.  EKG without ischemic changes or arrhythmia.  CT head shows small hematoma but no intracranial abnormality.  CT cervical spine without acute pathology.  X-ray right hip shows intertrochanteric hip fracture and x-ray right wrist shows radial ulnar fracture.    Discussed results with patient.  We'll admit and place orthopedics on consult.    Discussed with Dr. Vivia Budge for Dr. Eliseo Squires. Will admit to Dr. Pernell Dupre.  Will Pl., Doctor Imogene Burn on consult for orthopedic surgery.  Hematoma block was performed on the right wrist and a postreduction film was ordered.  A sugar tong splint was placed and patient has Refill less than 2 seconds and normal sensation distally.    Post reduction film shows improved alignment. Will admit to floor for further ortho evaluation.    Ortho Injury  Date/Time: 11/21/2017 5:40 PM  Performed by: Rueben Bash, DO  Authorized by: Gus Rankin, MD   Consent: Verbal consent obtained.  Risks and benefits: risks, benefits and alternatives were discussed  Consent given by: patient  Patient understanding: patient states understanding of the procedure being performed  Test results: test results available and properly labeled  Site marked: the operative site was marked  Imaging studies: imaging studies available  Required items: required blood products, implants, devices, and special equipment available  Patient identity confirmed: verbally with patient and arm band  Injury location: wrist  Location details: right wrist  Injury type: fracture  Fracture type: distal radius  Pre-procedure neurovascular assessment: neurovascularly intact  Pre-procedure distal perfusion: normal  Pre-procedure neurological function: normal  Pre-procedure range of motion: reduced  Anesthesia: hematoma block    Anesthesia:  Local anesthesia used: yes  Local Anesthetic: bupivacaine 0.5% without epinephrine  Anesthetic total: 6 mL    Sedation:  Patient sedated: no    Manipulation performed: yes  Skeletal traction  used: yes  Reduction successful: yes  X-ray confirmed reduction: yes  Immobilization: splint  Splint type: sugar tong  Supplies used: Ortho-Glass  Post-procedure neurovascular assessment: post-procedure neurovascularly intact  Post-procedure distal perfusion: normal  Post-procedure neurological function: normal  Post-procedure range of motion: unchanged  Patient tolerance: Patient tolerated the procedure well with no immediate complications              CONSULTS:  IP CONSULT TO ORTHOPEDIC SURGERY  IP CONSULT TO PHYSICAL MEDICINE REHAB      FINAL IMPRESSION      1. Closed intertrochanteric fracture of hip, right, initial encounter (HCC)    2. Fracture of radial shaft with ulna, closed, right, initial encounter    3. Fall down stairs, initial encounter          DISPOSITION/PLAN   DISPOSITION Admitted 11/21/2017 03:02:55 AM      PATIENT REFERRED TO:  Jasmine December  Eliseo Squires, MD  [address]  [place] [address]  [phone]            DISCHARGE MEDICATIONS:  Current Discharge Medication List             (Please note that portions of this note were completed with a voice recognition program.  Efforts were made to edit the dictations but occasionally words are mis-transcribed.)    Rueben Bash, DO (electronically signed)  Attending Emergency Physician      Rueben Bash, DO  11/21/17 7253       Rueben Bash, DO  11/21/17 1743

## 2017-11-21 NOTE — Telephone Encounter (Signed)
noted 

## 2017-11-21 NOTE — H&P (Signed)
[place] [address]                              HISTORY AND PHYSICAL    PATIENT NAME: Linda Linda [name], Linda A                  DOB:        08-09-1945  MED REC NO:   [id number]                          ROOM:       3124  ACCOUNT NO:   [id number]                           ADMIT DATE: 11/21/2017  PROVIDER:     Jaclynn Guarneri, MD    REASON FOR ADMISSION:  Right hip fracture and right wrist fracture.    HISTORY OF PRESENT ILLNESS:  This is Linda [name] 72 year old white female who was  at home in her usual state of health walking down the stairs yesterday  when she lost her balance and fell breaking her right hip and her right  wrist.  The patient denied any loss of consciousness.  There was no  syncope or presyncope.  She just lost her balance in Linda [name] mechanical fall.   Leading up to this, she has had no chest pain or shortness of breath.   No PND or orthopnea.  No nausea, vomiting, or diarrhea.  She has had  occasional palpitations due to her recent diagnosis of Graves disease.   She has had some iron-deficiency anemia which caused shortness of breath  several months ago, but now that that has improved, she no longer has  exertional dyspnea the way she did.  Again, no PND or orthopnea.  No  lower extremity edema.    REVIEW OF SYSTEMS:  Otherwise negative.    PAST MEDICAL HISTORY:  Significant for osteoporosis.  She has had Linda [name]  thoracic compression fracture in the past from the fall.  She is  postmenopausal.  She has osteoarthritis of her lumbar spine.  She has Linda [name]  history of chronic recurrent migraine headaches.  She had Linda [name] benign mass  of her parotid gland.  She had Linda [name] history of iron-deficiency anemia  earlier this year and Linda [name] GI workup was performed by Dr. Awanda Mink which was  unremarkable.  She has history of hypertension and depression,  hyperlipidemia, Graves disease, colon polyps.  She has Linda [name] history of Linda [name]  celiac artery atherosclerotic stenosis  that was 60% diagnosed in 2015,  and interestingly enough, she had bisphosphonate-associated  osteonecrosis of the jaw.    PAST SURGICAL HISTORY:  Significant for temporal artery biopsy in 2014,  recently upper GI endoscopy and colonoscopy with polypectomy in 07/2017  and 08/2017.  Recently, she had an esophageal motility study in 08/2017  due to hiatal hernia.  She had Linda [name] kyphoplasty of T10 in the past and she  had shoulder surgery from Linda [name] humerus fracture in the past as well.    ALLERGIES:  The patient is allergic only to ACTONEL which caused  osteonecrosis of the jaw.    CURRENT MEDICATIONS:  Medications at home:  The patient is on Prilosec  40 mg daily, Paxil 20 mg daily, Depakote 1000 mg  nightly, Corgard 10 mg  daily, methimazole 5 mg twice Linda [name] day, iron capsules once daily, Y60 and  folic acid capsules daily, and calcium and vitamin D twice daily.    SOCIAL HISTORY:  She is married.  She smokes 5 to 10 cigarettes each  day.  She drinks very little alcohol.  She has one daughter.  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    FAMILY HISTORY:  Reviewed and noncontributory.    PHYSICAL EXAMINATION:  VITAL SIGNS:  The patient is afebrile, blood pressure 137/85, pulse 81,  respiratory rate 18, oxygen saturation is 94% on room air.  GENERAL:  This is an elderly white female lying in bed.  She is in no  acute distress.  HEENT:  Pupils are equal, round, and reactive to light.  Extraocular  movements are intact.  Mucous membranes are moist.  Tongue and uvula are  midline.  NECK:  Without lymphadenopathy.  There is no carotid bruit.  Carotid  upstroke is normal.  Thyroid is without goiter or nodule.  CARDIOVASCULAR:  Regular rate and rhythm without murmur, rub, or gallop.  PMI is nondisplaced.  PULMONARY:  Clear, equal breath sounds bilaterally without wheezes,  rhonchi, or crackles.  ABDOMEN:  Bowel sounds are positive.  The abdomen is soft,  nontender,  nondistended.  There is no hepatosplenomegaly noted.  EXTREMITIES:  No clubbing, cyanosis, or edema.  Pulses are 2+ x3  extremities.  The right wrist pulse cannot be assessed due to her  splint.  She has Linda [name] splint on the right wrist and her right hip obviously  is tender to any manipulation or movement.  SKIN:  No rashes or lesions.  LYMPH:  No supraclavicular or cervical adenopathy is noted.  NEUROLOGIC:  She is alert and oriented x3.  Cranial nerves II through  XII are grossly intact.  She moves all extremities without difficulty.    DIAGNOSTIC STUDIES:  In the ER, her basic metabolic panel was  unremarkable except for Linda [name] sugar of 130 which was nonfasting.  White  count was 12, hemoglobin 12.6, platelet count 188.    Right wrist x-ray shows distal radius and ulnar fractures.    Chest x-ray showed Linda [name] large hiatal hernia but no other fractures noted.   She had Linda [name] right femoral intertrochanteric hip fracture.    CT of the head showed Linda [name] scalp contusion and age-related changes but no  other abnormality.    Cervical spine x-ray was negative.    EKG showed sinus rhythm.    ASSESSMENT:  1.  Right hip fracture.  2.  Right wrist fracture.  3.  History of iron-deficiency anemia.  4.  Osteoporosis.  5.  Gastroesophageal reflux disease with hiatal hernia.  6.  Graves disease.    The patient is Linda [name] low risk for an intermediate risk procedure.  She is  able to proceed with the planned surgical intervention without delay.   She has Linda [name] history of Graves disease and her most recent TSH was  performed two days ago.  It was just outside the therapeutic range of  4.39.  Last month, her free T4 was slightly low and free T3 was normal.   This will be repeated today.  Additionally, she will continue on her  home medication regimen for her acid reflux and her Graves disease at  the moment.  Again, clinically, she is euthyroid despite subtle  abnormalities of the thyroid function  testing.  Again, she can proceed  to surgery without  delay.        Jaclynn Guarneri, MD    D: 11/21/2017 8:23:24       T: 11/21/2017 8:26:29     [id number]  Job#: [id number]     Doc#: [id number]    CC:

## 2017-11-21 NOTE — Progress Notes (Signed)
Full consult note to follow    Xrays reviewed. Right intertrochanteric hip fracture.  Right displaced intra-articular distal radius fracture.    Linda [name] is a 72 y.o. female who presented to [facility] ER after she sustained a fall.  She was found to have the above fractures.  She was subsequently admitted for further treatment    Plan:  --Recommend operative fixation of both right hip and wrist fractures.  Will plan for tomorrow AM for both based on availability of OR.  Dr. Barnet Pall will fix wrist and I will fix hip.   --Bedrest  --Pain control  --NWB right leg and wrist  --NPO after midnight    --Medical evaluation and clearance.        Electronically signed by Arville Lime, MD on 11/21/2017 at 6:57 AM

## 2017-11-21 NOTE — Care Coordination-Inpatient (Addendum)
INITIAL CASE MANAGEMENT ASSESSMENT    Reviewed chart, met with patient to assess possible discharge needs. Explained Case Management role/services.     Living Situation: Pt states she lives at home with her husband.    ADLs: Pt states she was independent prior to her admittance into the hospital.     DME: None    PT/OT Recs: Pending     Active Services: Pt states she is not active with any outside agencies.      Transportation: Pt states her husband will transport upon d/c.    PCP: Angelica Pou, MD    PLAN/COMMENTS: Pt plans to d/c home with support from her husband.    Sw spoke with patient about ARU.    Pt expressed concerns with the level of intensity but will consider it. Pt states she has been to [facility] in [place] in the past and she loved it there. She hopes that can be an option.     Sw spoke with pt about Home Care.    Pt states she is truly not interested in having home care since she like her home privacy.     Sw spoke with pt about SNF placement.    Pt is not opposed to SNF option but she states she would rather discuss these decisions after surgery and she has been seen by PT and OT.     Sw presented pt with [facility] and SNF list.     Sw will follow.     SW/CM provided contact information for patient or family to call with any questions.  SW/CM will follow and assist as needed.    Electronically signed by Estrellita Ludwig on 11/21/2017 at 3:24 PM  Electronically signed by Marion Downer on 11/21/2017 at 3:34 PM

## 2017-11-21 NOTE — ED Notes (Signed)
Bed: B-09  Expected date: 11/21/17  Expected time: 12:25 AM  Means of arrival: [facility]  Comments:  80F hip pain/wrist deforminty     Tilden Fossa [place], RN  11/21/17 [phone]

## 2017-11-21 NOTE — Progress Notes (Signed)
Was notified by ortho that surgery will be tomorrow vs today. Pt notified and diet order placed.

## 2017-11-21 NOTE — Progress Notes (Signed)
Pharmacy Medication Reconciliation Note     List of medications patient is currently taking is complete.    Allergies:  Actonel [risedronate sodium]    Source of information:   1. Patient interview   2. MD office visits    Notes regarding home medications:   1. Changed Depakote ER 500mg  to 1 QHS  2. Changed Tapazole to 5mg  daily  3. Changed Lansoprazole to Omeprazole    Denies taking any other OTC or herbal medications    Regan Lemming, RPh 11/21/2017 9:52 AM

## 2017-11-21 NOTE — Progress Notes (Signed)
Pt admitted to room 3124 From ED via stretcher accompanied by spouse, A/O x 4, oriented to room and call light, call light in reach. Purewick catheter applied d/t limited mobility d/t pain, Iv fluids 0.9 ns infusing at 50 ml /hr to iv site left forearm, site wnl.  Bed in low locked position, call light in reach. Husband at bedside.

## 2017-11-21 NOTE — Progress Notes (Signed)
Received call from MD that they may proceed with surgery today. Pt has not eaten anything today, but has had approximately 120 mL cola earlier this morning. Informed consent reviewed and signed by pt. Awaiting further instructions.

## 2017-11-21 NOTE — Plan of Care (Signed)
Problem: Falls - Risk of:  Goal: Will remain free from falls  Description  Will remain free from falls  Outcome: Ongoing  Note:   Pt free from injury or falls at this time, fall precautions in place, bed in low position, side rail up x2, Morse Fall Risk: High (45 and higher), bed alarm on, reoriented to room and call light, reminded not to get up without assistance, call light in reach, will continue to monitor. Pt verbalizes understanding of fall risk procedures.    Goal: Absence of physical injury  Description  Absence of physical injury  Outcome: Ongoing  Note:   Pt has not incurred any type of physical injury this shift.     Problem: Pain:  Goal: Pain level will decrease  Description  Pain level will decrease  Outcome: Ongoing  Note:   Pt c/o R hip and wrist pain rated 10/10. PRN Dilaudid administered with only temporary relief. Order obtained for PRN Percocet which was administered. Will monitor for effectiveness.  Goal: Control of acute pain  Description  Control of acute pain  Outcome: Ongoing  Note:   Pt c/o R hip and wrist pain rated 10/10. PRN Dilaudid administered with only temporary relief. Order obtained for PRN Percocet which was administered. Will monitor for effectiveness.  Goal: Control of chronic pain  Description  Control of chronic pain  Outcome: Ongoing  Note:   Pt has had no c/o chronic pain issues this shift.     Problem: Risk for Impaired Skin Integrity  Goal: Tissue integrity - skin and mucous membranes  Description  Structural intactness and normal physiological function of skin and  mucous membranes.  Outcome: Ongoing  Note:   No areas of skin breakdown noted. Will monitor for changes.

## 2017-11-21 NOTE — Progress Notes (Signed)
Pt resting in supine position for majority of shift. RLE elevated on pillow to relieve pressure on thigh. Pt has ongoing c/o pain in R hip and R wrist rated 9-10/10. PRN Dilaudid IV and PO administered regularly with minimal relief. She also c/o itching and requested medication. MD contacted and order obtained. Pt aware of NPO status at [facility] for surgery planned for early tomorrow morning. Will continue to monitor.

## 2017-11-21 NOTE — Telephone Encounter (Signed)
New consult   New intertrochanteric fx of hip and radial fx of ulnar shaft.  Room 3124  Nurse Olivia Mackie [phone]  Ref DR Erskine Speed  CC/MEG  Per [place] gave Dr Barnet Pall already taken care of this.  This is done

## 2017-11-21 NOTE — ED Notes (Signed)
Fall risk screening completed.  Fall risk bracelet applied to patient.  Non-skid socks provided and placed on patient.  .  Based on score, a bed alarm was applied   The call light is within the patient's reach, and instructions for use were provided.  The bed is in the lowest position with wheels locked.  The patient has been advised to notify staff, using the call light, if there is a need to get up or use restroom.  The patient verbalized understanding of safety precautions and how to contact staff for assistance.        Kem Boroughs, RN  11/21/17 [phone]

## 2017-11-21 NOTE — ED Notes (Signed)
Pt to ED after a fall at home while walkning down her steps to the basement.  Pt states she fell down 5 steps to her basement floor.  Pt is having pain in right hip, right wrist and head.  Pt states she tripped going down the stairs.  Pt pain is 10/10.      Kem Boroughs, RN  11/21/17 [phone]

## 2017-11-22 ENCOUNTER — Inpatient Hospital Stay: Admit: 2017-11-22 | Payer: MEDICARE | Primary: Internal Medicine

## 2017-11-22 LAB — CBC WITH AUTO DIFFERENTIAL
Basophils %: 0.3 %
Basophils Absolute: 0 10*3/uL (ref 0.0–0.2)
Eosinophils %: 1 %
Eosinophils Absolute: 0.1 10*3/uL (ref 0.0–0.6)
Hematocrit: 34.4 % — ABNORMAL LOW (ref 36.0–48.0)
Hemoglobin: 11.7 g/dL — ABNORMAL LOW (ref 12.0–16.0)
Lymphocytes %: 24.9 %
Lymphocytes Absolute: 1.8 10*3/uL (ref 1.0–5.1)
MCH: 33.1 pg (ref 26.0–34.0)
MCHC: 34 g/dL (ref 31.0–36.0)
MCV: 97.2 fL (ref 80.0–100.0)
MPV: 7.6 fL (ref 5.0–10.5)
Monocytes %: 7.1 %
Monocytes Absolute: 0.5 10*3/uL (ref 0.0–1.3)
Neutrophils %: 66.7 %
Neutrophils Absolute: 4.9 10*3/uL (ref 1.7–7.7)
Platelets: 148 10*3/uL (ref 135–450)
RBC: 3.54 M/uL — ABNORMAL LOW (ref 4.00–5.20)
RDW: 13.7 % (ref 12.4–15.4)
WBC: 7.3 10*3/uL (ref 4.0–11.0)

## 2017-11-22 LAB — BASIC METABOLIC PANEL
Anion Gap: 10 (ref 3–16)
BUN: 6 mg/dL — ABNORMAL LOW (ref 7–20)
CO2: 26 mmol/L (ref 21–32)
Calcium: 8.8 mg/dL (ref 8.3–10.6)
Chloride: 103 mmol/L (ref 99–110)
Creatinine: <0.5 mg/dL — ABNORMAL LOW (ref 0.6–1.2)
GFR African American: >60 (ref >60)
GFR Non-African American: >60 (ref >60)
Glucose: 118 mg/dL — ABNORMAL HIGH (ref 70–99)
Potassium: 3.9 mmol/L (ref 3.5–5.1)
Sodium: 139 mmol/L (ref 136–145)

## 2017-11-22 MED ORDER — DEXAMETHASONE SODIUM PHOSPHATE 20 MG/5ML IJ SOLN
20 MG/5ML | INTRAMUSCULAR | Status: DC | PRN
Start: 2017-11-22 — End: 2017-11-22
  Administered 2017-11-22: 12:00:00 8 via INTRAVENOUS

## 2017-11-22 MED ORDER — ONDANSETRON HCL 4 MG/2ML IJ SOLN
4 MG/2ML | Freq: Four times a day (QID) | INTRAMUSCULAR | Status: DC | PRN
Start: 2017-11-22 — End: 2017-11-24

## 2017-11-22 MED ORDER — ONDANSETRON HCL 4 MG/2ML IJ SOLN
4 MG/2ML | INTRAMUSCULAR | Status: DC | PRN
Start: 2017-11-22 — End: 2017-11-22
  Administered 2017-11-22: 13:00:00 4 via INTRAVENOUS

## 2017-11-22 MED ORDER — HYDROMORPHONE HCL 2 MG/ML IJ SOLN
2 MG/ML | INTRAMUSCULAR | Status: DC | PRN
Start: 2017-11-22 — End: 2017-11-22
  Administered 2017-11-22: 13:00:00 1 via INTRAVENOUS

## 2017-11-22 MED ORDER — OXYCODONE-ACETAMINOPHEN 5-325 MG PO TABS
5-325 MG | ORAL | Status: DC | PRN
Start: 2017-11-22 — End: 2017-11-24
  Administered 2017-11-23 – 2017-11-24 (×5): 2 via ORAL

## 2017-11-22 MED ORDER — FENTANYL CITRATE (PF) 100 MCG/2ML IJ SOLN
100 MCG/2ML | INTRAMUSCULAR | Status: DC | PRN
Start: 2017-11-22 — End: 2017-11-22

## 2017-11-22 MED ORDER — ONDANSETRON HCL 4 MG/2ML IJ SOLN
4 | INTRAMUSCULAR | Status: AC
Start: 2017-11-22 — End: 2017-11-22

## 2017-11-22 MED ORDER — MORPHINE SULFATE (PF) 2 MG/ML IV SOLN
2 MG/ML | INTRAVENOUS | Status: DC | PRN
Start: 2017-11-22 — End: 2017-11-24
  Administered 2017-11-22: 20:00:00 2 mg via INTRAVENOUS

## 2017-11-22 MED ORDER — HYDROMORPHONE 0.5MG/0.5ML IJ SOLN
1 MG/ML | Status: DC | PRN
Start: 2017-11-22 — End: 2017-11-22

## 2017-11-22 MED ORDER — CEFAZOLIN 2000 MG IN D5W 100 ML IVPB
Status: AC
Start: 2017-11-22 — End: 2017-11-22
  Administered 2017-11-22: 12:00:00 2 g via INTRAVENOUS

## 2017-11-22 MED ORDER — DOCUSATE SODIUM 100 MG PO CAPS
100 MG | Freq: Two times a day (BID) | ORAL | Status: DC
Start: 2017-11-22 — End: 2017-11-24
  Administered 2017-11-23 – 2017-11-24 (×4): 100 mg via ORAL

## 2017-11-22 MED ORDER — ROCURONIUM BROMIDE 50 MG/5ML IV SOLN
50 | INTRAVENOUS | Status: AC
Start: 2017-11-22 — End: 2017-11-22

## 2017-11-22 MED ORDER — DEXAMETHASONE SODIUM PHOSPHATE 20 MG/5ML IJ SOLN
20 | INTRAMUSCULAR | Status: AC
Start: 2017-11-22 — End: 2017-11-22

## 2017-11-22 MED ORDER — CEFAZOLIN 2000 MG IN D5W 100 ML IVPB
Freq: Three times a day (TID) | Status: AC
Start: 2017-11-22 — End: 2017-11-23
  Administered 2017-11-22 – 2017-11-23 (×2): 2 g via INTRAVENOUS

## 2017-11-22 MED ORDER — SODIUM CHLORIDE 0.9 % IV SOLN
0.9 % | INTRAVENOUS | Status: DC
Start: 2017-11-22 — End: 2017-11-24
  Administered 2017-11-22 – 2017-11-23 (×2): via INTRAVENOUS

## 2017-11-22 MED ORDER — PROPOFOL 200 MG/20ML IV EMUL
200 | INTRAVENOUS | Status: AC
Start: 2017-11-22 — End: 2017-11-22

## 2017-11-22 MED ORDER — ROCURONIUM BROMIDE 50 MG/5ML IV SOLN
50 MG/5ML | INTRAVENOUS | Status: DC | PRN
Start: 2017-11-22 — End: 2017-11-22
  Administered 2017-11-22: 12:00:00 45 via INTRAVENOUS
  Administered 2017-11-22: 12:00:00 5 via INTRAVENOUS

## 2017-11-22 MED ORDER — OXYCODONE-ACETAMINOPHEN 5-325 MG PO TABS
5-325 MG | ORAL | Status: DC | PRN
Start: 2017-11-22 — End: 2017-11-22

## 2017-11-22 MED ORDER — SODIUM CHLORIDE 0.9 % IV SOLN
0.9 % | INTRAVENOUS | Status: DC
Start: 2017-11-22 — End: 2017-11-22
  Administered 2017-11-22: 12:00:00 via INTRAVENOUS

## 2017-11-22 MED ORDER — SUGAMMADEX SODIUM 200 MG/2ML IV SOLN
200 | INTRAVENOUS | Status: AC
Start: 2017-11-22 — End: 2017-11-22

## 2017-11-22 MED ORDER — SUCCINYLCHOLINE CHLORIDE 200 MG/10ML IV SOSY
200 MG/10ML | INTRAVENOUS | Status: DC | PRN
Start: 2017-11-22 — End: 2017-11-22
  Administered 2017-11-22: 12:00:00 120 via INTRAVENOUS

## 2017-11-22 MED ORDER — FENTANYL CITRATE (PF) 100 MCG/2ML IJ SOLN
100 MCG/2ML | INTRAMUSCULAR | Status: DC | PRN
Start: 2017-11-22 — End: 2017-11-22
  Administered 2017-11-22: 12:00:00 100 via INTRAVENOUS

## 2017-11-22 MED ORDER — MORPHINE SULFATE (PF) 4 MG/ML IV SOLN
4 MG/ML | INTRAVENOUS | Status: DC | PRN
Start: 2017-11-22 — End: 2017-11-24
  Administered 2017-11-24: 02:00:00 4 mg via INTRAVENOUS

## 2017-11-22 MED ORDER — FENTANYL CITRATE (PF) 100 MCG/2ML IJ SOLN
100 | INTRAMUSCULAR | Status: AC
Start: 2017-11-22 — End: 2017-11-22

## 2017-11-22 MED ORDER — CEFAZOLIN SODIUM 1 G IJ SOLR
1 | INTRAMUSCULAR | Status: AC
Start: 2017-11-22 — End: 2017-11-22

## 2017-11-22 MED ORDER — NORMAL SALINE FLUSH 0.9 % IV SOLN
0.9 % | Freq: Two times a day (BID) | INTRAVENOUS | Status: DC
Start: 2017-11-22 — End: 2017-11-22

## 2017-11-22 MED ORDER — LIDOCAINE HCL (PF) 2 % IJ SOLN
2 % | INTRAMUSCULAR | Status: DC | PRN
Start: 2017-11-22 — End: 2017-11-22
  Administered 2017-11-22: 12:00:00 80 via INTRAVENOUS

## 2017-11-22 MED ORDER — NORMAL SALINE FLUSH 0.9 % IV SOLN
0.9 % | Freq: Two times a day (BID) | INTRAVENOUS | Status: DC
Start: 2017-11-22 — End: 2017-11-24
  Administered 2017-11-23 – 2017-11-24 (×3): 10 mL via INTRAVENOUS

## 2017-11-22 MED ORDER — KETOROLAC TROMETHAMINE 30 MG/ML IJ SOLN
30 | INTRAMUSCULAR | Status: AC
Start: 2017-11-22 — End: 2017-11-22

## 2017-11-22 MED ORDER — NORMAL SALINE FLUSH 0.9 % IV SOLN
0.9 % | INTRAVENOUS | Status: DC | PRN
Start: 2017-11-22 — End: 2017-11-22

## 2017-11-22 MED ORDER — KETOROLAC TROMETHAMINE 30 MG/ML IJ SOLN
30 MG/ML | INTRAMUSCULAR | Status: DC | PRN
Start: 2017-11-22 — End: 2017-11-22
  Administered 2017-11-22: 13:00:00 15 via INTRAVENOUS

## 2017-11-22 MED ORDER — BUPIVACAINE HCL (PF) 0.5 % IJ SOLN
0.5 | INTRAMUSCULAR | Status: AC
Start: 2017-11-22 — End: 2017-11-22

## 2017-11-22 MED ORDER — ENOXAPARIN SODIUM 40 MG/0.4ML SC SOLN
40 MG/0.4ML | Freq: Every day | SUBCUTANEOUS | Status: DC
Start: 2017-11-22 — End: 2017-11-24
  Administered 2017-11-23 – 2017-11-24 (×2): 40 mg via SUBCUTANEOUS

## 2017-11-22 MED ORDER — OXYCODONE-ACETAMINOPHEN 5-325 MG PO TABS
5-325 MG | ORAL | Status: DC | PRN
Start: 2017-11-22 — End: 2017-11-24
  Administered 2017-11-23: 01:00:00 1 via ORAL

## 2017-11-22 MED ORDER — FAMOTIDINE 20 MG/2ML IV SOLN
20 MG/2ML | Freq: Once | INTRAVENOUS | Status: DC
Start: 2017-11-22 — End: 2017-11-22

## 2017-11-22 MED ORDER — PHENYLEPHRINE HCL 10 MG/ML SOLN (MIXTURES ONLY)
10 | INTRAVENOUS | Status: DC | PRN
Start: 2017-11-22 — End: 2017-11-22
  Administered 2017-11-22 (×2): 300 via INTRAVENOUS

## 2017-11-22 MED ORDER — PROPOFOL 200 MG/20ML IV EMUL
200 MG/20ML | INTRAVENOUS | Status: DC | PRN
Start: 2017-11-22 — End: 2017-11-22
  Administered 2017-11-22: 12:00:00 120 via INTRAVENOUS

## 2017-11-22 MED ORDER — BUPIVACAINE HCL (PF) 0.5 % IJ SOLN
0.5 % | Freq: Once | INTRAMUSCULAR | Status: AC | PRN
Start: 2017-11-22 — End: 2017-11-22
  Administered 2017-11-22: 14:00:00 10 via INTRADERMAL

## 2017-11-22 MED ORDER — SUGAMMADEX SODIUM 200 MG/2ML IV SOLN
200 MG/2ML | INTRAVENOUS | Status: DC | PRN
Start: 2017-11-22 — End: 2017-11-22
  Administered 2017-11-22: 14:00:00 130 via INTRAVENOUS

## 2017-11-22 MED ORDER — BUPIVACAINE HCL (PF) 0.5 % IJ SOLN
0.5 % | Freq: Once | INTRAMUSCULAR | Status: AC | PRN
Start: 2017-11-22 — End: 2017-11-22
  Administered 2017-11-22: 13:00:00 6 via INTRADERMAL

## 2017-11-22 MED ORDER — ONDANSETRON HCL 4 MG/2ML IJ SOLN
4 MG/2ML | Freq: Once | INTRAMUSCULAR | Status: DC | PRN
Start: 2017-11-22 — End: 2017-11-22

## 2017-11-22 MED ORDER — NORMAL SALINE FLUSH 0.9 % IV SOLN
0.9 % | INTRAVENOUS | Status: DC | PRN
Start: 2017-11-22 — End: 2017-11-24

## 2017-11-22 MED ORDER — LIDOCAINE HCL (PF) 2 % IJ SOLN
2 | INTRAMUSCULAR | Status: AC
Start: 2017-11-22 — End: 2017-11-22

## 2017-11-22 MED ORDER — SUCCINYLCHOLINE CHLORIDE 200 MG/10ML IV SOSY
200 | INTRAVENOUS | Status: AC
Start: 2017-11-22 — End: 2017-11-22

## 2017-11-22 MED FILL — DIVALPROEX SODIUM ER 500 MG PO TB24: 500 mg | ORAL | Qty: 1

## 2017-11-22 MED FILL — HYDROMORPHONE HCL 1 MG/ML IJ SOLN: 1 mg/mL | INTRAMUSCULAR | Qty: 1

## 2017-11-22 MED FILL — OYSTER SHELL CALCIUM/D 500-200 MG-UNIT PO TABS: 500-200 MG-UNIT | ORAL | Qty: 1

## 2017-11-22 MED FILL — BUPIVACAINE HCL (PF) 0.5 % IJ SOLN: 0.5 % | INTRAMUSCULAR | Qty: 30

## 2017-11-22 MED FILL — ONDANSETRON HCL 4 MG/2ML IJ SOLN: 4 MG/2ML | INTRAMUSCULAR | Qty: 2

## 2017-11-22 MED FILL — NADOLOL 40 MG PO TABS: 40 mg | ORAL | Qty: 1

## 2017-11-22 MED FILL — BRIDION 200 MG/2ML IV SOLN: 200 MG/2ML | INTRAVENOUS | Qty: 0.7

## 2017-11-22 MED FILL — ROCURONIUM BROMIDE 50 MG/5ML IV SOLN: 50 MG/5ML | INTRAVENOUS | Qty: 5

## 2017-11-22 MED FILL — DEXAMETHASONE SODIUM PHOSPHATE 20 MG/5ML IJ SOLN: 20 MG/5ML | INTRAMUSCULAR | Qty: 5

## 2017-11-22 MED FILL — FERREX 150 150 MG PO CAPS: 150 mg | ORAL | Qty: 1

## 2017-11-22 MED FILL — CEFAZOLIN 2000 MG IN D5W 100 ML IVPB: Qty: 2

## 2017-11-22 MED FILL — BRIDION 200 MG/2ML IV SOLN: 200 MG/2ML | INTRAVENOUS | Qty: 2

## 2017-11-22 MED FILL — MORPHINE SULFATE 2 MG/ML IJ SOLN: 2 mg/mL | INTRAMUSCULAR | Qty: 1

## 2017-11-22 MED FILL — FENTANYL CITRATE (PF) 100 MCG/2ML IJ SOLN: 100 MCG/2ML | INTRAMUSCULAR | Qty: 2

## 2017-11-22 MED FILL — CEFAZOLIN SODIUM 1 G IJ SOLR: 1 g | INTRAMUSCULAR | Qty: 2000

## 2017-11-22 MED FILL — METHIMAZOLE 5 MG PO TABS: 5 mg | ORAL | Qty: 1

## 2017-11-22 MED FILL — XYLOCAINE-MPF 2 % IJ SOLN: 2 % | INTRAMUSCULAR | Qty: 5

## 2017-11-22 MED FILL — SUCCINYLCHOLINE CHLORIDE 200 MG/10ML IV SOSY: 200 MG/10ML | INTRAVENOUS | Qty: 10

## 2017-11-22 MED FILL — PAROXETINE HCL 20 MG PO TABS: 20 mg | ORAL | Qty: 1

## 2017-11-22 MED FILL — DIPRIVAN 200 MG/20ML IV EMUL: 200 MG/20ML | INTRAVENOUS | Qty: 20

## 2017-11-22 MED FILL — KETOROLAC TROMETHAMINE 30 MG/ML IJ SOLN: 30 mg/mL | INTRAMUSCULAR | Qty: 1

## 2017-11-22 NOTE — Care Coordination-Inpatient (Signed)
Sw spoke with patient and daughter present in room regarding rehab options. Patient is interested in acute rehab here at [facility] made referral to [facility] on acute rehab.    Electronically signed by Marion Downer on 11/22/2017 at 2:59 PM

## 2017-11-22 NOTE — Progress Notes (Signed)
Pt was returned from OR per bed husband is at bedside pt denies pain she is on 2 liters of oxygen per nasal cannula pt is drowsy is rolled all the way over on her left side has her knees bent on both legs.

## 2017-11-22 NOTE — Op Note (Signed)
[name] [address]                                OPERATIVE REPORT    PATIENT NAME: Linda [name], Linda A                  DOB:        01/02/1946  MED REC NO:   [id number]                          ROOM:       3124  ACCOUNT NO:   [id number]                           ADMIT DATE: 11/21/2017  PROVIDER:     Dionisio David, MD      DATE OF PROCEDURE:  11/22/2017    PREOPERATIVE DIAGNOSIS:  Right distal radius three-part intra-articular  displaced fracture.    POSTOPERATIVE DIAGNOSIS:  Right distal radius three-part intra-articular  displaced fracture.    OPERATION PERFORMED:  Open treatment of right distal radius three-part  intra-articular fracture with open reduction and internal fixation.    SURGEON:  Linda David, MD    ASSISTANT:  Linda [name], Surgical Assistant.    ANESTHESIA:  General anesthesia.    ESTIMATED BLOOD LOSS:  Minimal.    COMPLICATIONS:  None.    TOURNIQUET:  Right upper arm, 250 mmHg.    IMPLANTS USED:  Stryker titanium intermediate volar locking plate with [name]  total of seven distal 2.7 locking screws and two proximal screws.    INDICATIONS:  This is [name] 72 year old white female, right-hand dominant,  who sustained [name] fall with right wrist and hip injury.  She was found to  have [name] right hip intertrochanteric displaced fracture as well as [name] right  distal radius intra-articular three-part displaced fracture.  All risks,  benefits, and alternatives were discussed with the patient.  She agreed  to proceed with the surgical treatment.    OPERATIVE PROCEDURE:  The patient's right wrist was marked.  She also  had right hip marked by Dr. Bridgett [name].  She was then brought to the operating  room and underwent general anesthesia.  [name] well-padded tourniquet was  placed, right upper arm.  The right upper extremity was then prepped and  draped in regular sterile routine fashion.  [name] time-out was called  confirming the patient's name, site  and procedure.    Esmarch was used for exsanguination and tourniquet was inflated to 250  mmHg.  [name] volar approach was performed.  An incision was made over the  FCR tendon.  The tendon sheath was opened.  At this point, we incised  the pronator quadratus muscle with cautery.  We exposed the fracture.   It was found to be [name] three-part intra-articular fracture.  We carefully  manipulated the fracture.  We were able to reduce it anatomically.    While maintaining the reduction, we used [name] styloid process K-wire for  provisional fixation.  While maintaining the reduction, we put the plate  in appropriate position.  We put one screw in the oblong hole and then  we fine-tuned the position of the plate.  We then reduced the fracture  to the plate and locked it with  total of seven distal 2.7 locking  screws.  We added one more locking screw in the shaft.  Overall, we were  very satisfied with the anatomic reduction and position of all the  screws, confirmed with mini C-arm in two planes.    At this point, we let the tourniquet down and hemostasis was secured.   We irrigated the incision copiously with normal saline.  We then closed  the subcu with 3-0 Vicryl and skin with 4-0 Monocryl, and Steri-Strips  were then applied.    Dressing was then applied in the form of Xeroform, 4x4, sterile Webril,  and [name] volar splint was applied.    The patient tolerated the procedure well.    At this point, the patient is already on the fracture table and the  second part of the procedure, which will be [name] gamma nail right femur, will be  dictated by Dr. Bridgett [name].        Linda David, MD    D: 11/22/2017 9:36:02       T: 11/22/2017 11:23:45     [id number]  Job#: [id number]     Doc#: [id number]    CC:  Linda Pou, MD

## 2017-11-22 NOTE — Progress Notes (Signed)
Physical Therapy    Facility/Department: [facility]  Initial Assessment    NAME: Linda [name]  DOB: [birth year]  MRN: [id number]    Date of Service: 11/22/2017   -anticipate will manage up to rolling walker with platform attachment on right   -should manage high frequency of sessions for direct discharge to home with family assist and home PT    Discharge Recommendations:  Linda [name] scored a 9/24 on the AM-PAC short mobility form. Current research shows that an AM-PAC score of 17 or less is typically not associated with a discharge to the patient's home setting. Based on the patient's AM-PAC score and their current functional mobility deficits, it is recommended that the patient have 5-7 sessions per week of Physical Therapy at d/c to increase the patient's independence.    Assessment   Body structures, Functions, Activity limitations: Decreased functional mobility ;Decreased strength;Decreased ADL status;Decreased balance  Prognosis: Good  Decision Making: Medium Complexity  REQUIRES PT FOLLOW UP: Yes  Activity Tolerance  Activity Tolerance: Patient Tolerated treatment well       Patient Diagnosis(es): The primary encounter diagnosis was Closed intertrochanteric fracture of hip, right, initial encounter (HCC). Diagnoses of Fracture of radial shaft with ulna, closed, right, initial encounter and Fall down stairs, initial encounter were also pertinent to this visit.     has a past medical history of Bisphosphonate-associated osteonecrosis of the jaw [facility]), Celiac artery atherosclerosis, Colon polyps, Dyspepsia, Graves disease, High cholesterol, History of depression, Hypertension, Iron deficiency anemia secondary to blood loss (chronic), Mass of parotid gland, Migraine, Osteoarthritis of lumbar spine, Other intestinal malabsorption, Postmenopausal, and Thoracic compression fracture (HCC).   has a past surgical history that includes shoulder surgery; Artery Biopsy (Right, 12-24-12); Upper  gastrointestinal endoscopy (08/26/2014); Colonoscopy ([id number]); Colonoscopy (N/A, 07/09/2017); Colonoscopy (N/A, 07/09/2017); Upper gastrointestinal endoscopy (N/A, 08/08/2017); esophageal motility study (N/A, 08/27/2017); Fixation Kyphoplasty; Wrist fracture surgery (Right, 11/22/2017); and hip surgery (Right, 11/22/2017).    Restrictions  Restrictions/Precautions  Restrictions/Precautions: Fall Risk  Position Activity Restriction  Other position/activity restrictions: RLE WBAT, NWBing RUE  Vision/Hearing  Vision: Impaired  Vision Exceptions: Wears glasses for reading  Hearing: Within functional limits     Subjective  General  Patient assessed for rehabilitation services?: Yes  Additional Pertinent Hx: here due to fall with right hip fracture and right wrist fracture       Response To Previous Treatment: Not applicable  Family / Caregiver Present: Yes(daughter arrived near conclusion of session)  Follows Commands: Within Functional Limits  Subjective  Subjective: sleeping at arrival and agreeable to PT OT assessments and OOB to the recliner - alert oriented   Pain Screening  Patient Currently in Pain: (no pain at rest )  Orientation  Orientation  Overall Orientation Status: Within Functional Limits  Social/Functional History  Social/Functional History  Lives With: Spouse  Type of Home: House  Home Layout: Laundry in basement, Two level, Able to Live on Main level with bedroom/bathroom, Performs ADL's on one level(Pt does not have to go to second floor )  Home Access: Stairs to enter without rails  Entrance Stairs - Number of Steps: 1 or none in garage   Bathroom Shower/Tub: Medical sales representative: Standard  Home Equipment: Crutches  ADL Assistance: Independent  Homemaking Assistance: Independent  Ambulation Assistance: Independent  Transfer Assistance: Independent  Active Driver: Yes  Objective  ROM and strength non-surgical limbs grossly wfl - good strength at right shoulder   Motor Control  Gross Motor?:  WFL  Sensation  Overall Sensation Status: WFL  Bed mobility  Supine to Sit: Moderate assistance  Transfers  Sit to Stand: Minimal Assistance;2 Person Assistance(in sara stedy with second assist to help assure safety to right wrist)  Stand to sit: Minimal Assistance;2 Person Assistance  Ambulation  Ambulation?: No(will get up to a platform walker tomorrow)  Stairs/Curb  Stairs?: No     Balance  Comments: able to sit midline at EOB with SBA for safety    -midline in static stance in the sara stedy  Exercises  Ankle Pumps: 30 per hour in easy pace  Comments: encouraged practice with the spirometer as instructed by RT     Plan   Plan  Times per week: qd sat then likely to discharge  Current Treatment Recommendations: Building services engineer, Teacher, early years/pre, Investment banker, operational, Modalities, Positioning, Patient/Caregiver Education & Training, ADL/Self-care Training  Safety Devices  Type of devices: All fall risk precautions in place, Call light within reach, Chair alarm in place, Left in chair, Nurse notified(Tracy)    AM-PAC Score  AM-PAC Inpatient Mobility Raw Score : 9  AM-PAC Inpatient T-Scale Score : 30.55  Mobility Inpatient CMS 0-100% Score: 81.38  Mobility Inpatient CMS G-Code Modifier : CM    Goals  Short term goals  Time Frame for Short term goals: 1-2 days   Short term goal 1: bed mobility at mod assist   Short term goal 2: transfers at min/mod assist to platform walker  Short term goal 3: ambulation at mod assist wbat LEs and nwb right wrist for 20 feet  Patient Goals   Patient goals : get home ASAP       Therapy Time   Individual Concurrent Group Co-treatment   Time In           Time Out 1510         Minutes                   Linda [name], PT

## 2017-11-22 NOTE — Progress Notes (Signed)
[facility] Note      Chief Complaint: I am ready for surgery    Subjective:    Patient was seen this morning in her room, ready to leave for surgery.  No new problems overnight.  Pain is reasonably controlled.  She is anxious to get the fractures repaired.    No chest pain or shortness breath. No cough or sputum. No nausea, vomiting, diarrhea. No abdominal pain. No dysuria.  The remainder of the review of systems is negative.     PMH, PSH, FH/SH reviewed and unchanged as documented in the H&P personally documented at admission 11/21/17    Medication list reviewed    Objective:    BP 136/69    Pulse 65    Temp 98.7 ??F (37.1 ??C) (Oral)    Resp 18    Ht 5\' 4"  (1.626 m)    Wt 149 lb 11.1 oz (67.9 kg)    SpO2 93%    BMI 25.69 kg/m??   Temp  Avg: 98.8 ??F (37.1 ??C)  Min: 98.3 ??F (36.8 ??C)  Max: 99.2 ??F (37.3 ??C)    RRR  Chest-chest clear throughout, respirations easy  Abd-sounds positive, soft, nontender, nondistended  Ext- no edema    The Following Labs Were Reviewed Today:    Recent Results (from the past 24 hour(s))   Basic Metabolic Panel    Collection Time: 11/22/17  5:42 AM   Result Value Ref Range    Sodium 139 136 - 145 mmol/L    Potassium 3.9 3.5 - 5.1 mmol/L    Chloride 103 99 - 110 mmol/L    CO2 26 21 - 32 mmol/L    Anion Gap 10 3 - 16    Glucose 118 (H) 70 - 99 mg/dL    BUN 6 (L) 7 - 20 mg/dL    CREATININE <0.5 (L) 0.6 - 1.2 mg/dL    GFR Non-African American >60 >60    GFR African American >60 >60    Calcium 8.8 8.3 - 10.6 mg/dL   CBC Auto Differential    Collection Time: 11/22/17  5:42 AM   Result Value Ref Range    WBC 7.3 4.0 - 11.0 K/uL    RBC 3.54 (L) 4.00 - 5.20 M/uL    Hemoglobin 11.7 (L) 12.0 - 16.0 g/dL    Hematocrit 34.4 (L) 36.0 - 48.0 %    MCV 97.2 80.0 - 100.0 fL    MCH 33.1 26.0 - 34.0 pg    MCHC 34.0 31.0 - 36.0 g/dL    RDW 13.7 12.4 - 15.4 %    Platelets 148 135 - 450 K/uL    MPV 7.6 5.0 - 10.5 fL    Neutrophils % 66.7 %    Lymphocytes % 24.9 %    Monocytes % 7.1 %    Eosinophils %  1.0 %    Basophils % 0.3 %    Neutrophils # 4.9 1.7 - 7.7 K/uL    Lymphocytes # 1.8 1.0 - 5.1 K/uL    Monocytes # 0.5 0.0 - 1.3 K/uL    Eosinophils # 0.1 0.0 - 0.6 K/uL    Basophils # 0.0 0.0 - 0.2 K/uL       ASSESSMENT/PLAN:      Principal Problem:    Closed intertrochanteric fracture of hip, right/Closed fracture of right wrist- OR today.  Postoperatively will need rehabilitation evaluation.  Active Problems:    Iron deficiency anemia secondary to blood loss (chronic)-CBC okay. 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    GERD (gastroesophageal reflux disease)-stable    Graves disease-TSH still high, Tapazole reduced to 5 mg once daily. Free T4 and free T3 normal.    Daxtyn Rottenberg Tomasita Crumble, MD, FACP  7:40 AM  11/22/2017

## 2017-11-22 NOTE — Progress Notes (Signed)
Pt awake and alert. Denies c/o pain at this time.

## 2017-11-22 NOTE — Anesthesia Post-Procedure Evaluation (Signed)
Department of Anesthesiology  Postprocedure Note    Patient: Linda [name]  MRN: [id number]  Birthdate: [birth year]  Date of evaluation: 11/22/2017  Time:  11:05 AM     Procedure Summary     Date:  11/22/17 Room / Location:  [facility] OR 03 / [facility]    Anesthesia Start:  1941 Anesthesia Stop:  0958    Procedures:       RIGHT HIP GAMMA NAILING (Right )      OPEN REDUCTION INTERNAL FIXATION RIGHT RADIUS WITH C-ARM (Right ) Diagnosis:  (RIGHT HIP FRACTURE)    Surgeon:  Arville Lime, MD; Vevelyn Francois, MD Responsible Provider:  Stann Mainland, MD    Anesthesia Type:  general ASA Status:  3          Anesthesia Type: general    Aldrete Phase I: Aldrete Score: 8    Aldrete Phase II:      Last vitals: Reviewed and per EMR flowsheets.       Anesthesia Post Evaluation    Patient location during evaluation: PACU  Patient participation: complete - patient participated  Level of consciousness: awake and alert  Airway patency: patent  Nausea & Vomiting: no nausea and no vomiting  Complications: no  Cardiovascular status: hemodynamically stable  Respiratory status: acceptable  Hydration status: stable

## 2017-11-22 NOTE — Anesthesia Pre-Procedure Evaluation (Signed)
Department of Anesthesiology  Preprocedure Note       Name:  Linda [name]   Age:  72 y.o.  DOB:  1945/12/16                                          MRN:  [id number]         Date:  11/22/2017      Surgeon: Juliann Mule):  Arville Lime, MD  Vevelyn Francois, MD    Procedure: RIGHT HIP GAMMA NAILING (Right )  OPEN REDUCTION INTERNAL FIXATION RIGHT RADIUS WITH C-ARM (Right )    Medications prior to admission:   Prior to Admission medications    Medication Sig Start Date End Date Taking? Authorizing Provider   divalproex (DEPAKOTE ER) 500 MG extended release tablet Take 500 mg by mouth nightly   Yes Historical Provider, MD   PARoxetine (PAXIL) 20 MG tablet TAKE 1 TABLET BY MOUTH EVERY DAY 11/10/17  Yes Annye Asa, MD   nadolol (CORGARD) 20 MG tablet TAKE 1/2 TABLET BY MOUTH DAILY 08/21/17  Yes Angelica Pou, MD   methimazole (TAPAZOLE) 5 MG tablet Take 5 mg by mouth daily    Yes Historical Provider, MD   Polysaccharide Iron Complex 391.3 (180 Fe) MG CAPS One a day for six months 08/02/17  Yes Angelica Pou, MD   Cobalamine Combinations (B12 FOLATE) 800-800 MCG CAPS One a day indefinitely 06/24/17  Yes Angelica Pou, MD   Calcium Carbonate-Vitamin D (CALTRATE 600+D) 600-400 MG-UNIT CHEW Take 1 tablet by mouth 2 times daily. 01/16/10  Yes Angelica Pou, MD   omeprazole (PRILOSEC) 40 MG delayed release capsule Take 40 mg by mouth daily 09/30/17   Historical Provider, MD       Current medications:    Current Facility-Administered Medications   Medication Dose Route Frequency Provider Last Rate Last Dose   ??? ceFAZolin (ANCEF) 2 g in dextrose 5 % 100 mL IVPB  2 g Intravenous On Call to [place], APRN - CNP       ??? 0.9 % sodium chloride infusion   Intravenous Continuous Ashok Cordia, MD       ??? sodium chloride flush 0.9 % injection 10 mL  10 mL Intravenous 2 times per day Ashok Cordia, MD       ??? sodium chloride flush 0.9 % injection 10 mL  10 mL Intravenous PRN Ashok Cordia, MD        ??? famotidine (PEPCID) injection 20 mg  20 mg Intravenous Once Ashok Cordia, MD       ??? HYDROmorphone (DILAUDID) injection 1 mg  1 mg Intravenous Q3H PRN Joellyn Quails, MD   1 mg at 11/22/17 0538   ??? 0.9 % sodium chloride infusion   Intravenous Continuous Joellyn Quails, MD 50 mL/hr at 11/21/17 1753     ??? calcium-vitamin D (OSCAL-500) 500-200 MG-UNIT per tablet 1 tablet  1 tablet Oral BID WC Annye Asa, MD   1 tablet at 11/21/17 1721   ??? nadolol (CORGARD) tablet 10 mg  10 mg Oral Nightly Annye Asa, MD   10 mg at 11/21/17 2200   ??? pantoprazole (PROTONIX) tablet 40 mg  40 mg Oral QAM AC Annye Asa, MD   40 mg at 11/21/17 0954   ??? PARoxetine (PAXIL) tablet 20 mg  20 mg  Oral Daily Annye Asa, MD   20 mg at 11/21/17 7616   ??? iron polysaccharides (NIFEREX) capsule 150 mg  1 capsule Oral Daily Annye Asa, MD   150 mg at 11/21/17 0737   ??? methimazole (TAPAZOLE) tablet 5 mg  5 mg Oral Daily Annye Asa, MD       ??? divalproex (DEPAKOTE ER) extended release tablet 500 mg  500 mg Oral Nightly Annye Asa, MD   500 mg at 11/21/17 2200   ??? HYDROmorphone (DILAUDID) tablet 2 mg  2 mg Oral Q6H PRN Wynelle Bourgeois, APRN - CNP   2 mg at 11/21/17 1231   ??? hydrOXYzine (VISTARIL) capsule 25 mg  25 mg Oral Q4H PRN Annye Asa, MD   25 mg at 11/21/17 1721       Allergies:    Allergies   Allergen Reactions   ??? Actonel [Risedronate Sodium]      Osteonecrosis jaw documented       Problem List:    Patient Active Problem List   Diagnosis Code   ??? Hyperlipidemia E78.5   ??? 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 Menopausal and postmenopausal disorder N95.9   ??? GERD (gastroesophageal reflux disease) K21.9   ??? Migraine headache G43.909   ??? Colon polyps K63.5   ??? Depression F32.9   ??? Intractable headache R51   ??? Hypoxemia R09.02   ??? Osteoporosis M81.0   ??? Intractable chronic migraine without aura G43.719   ??? Medication overuse headache G44.40    ??? 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 Graves disease E05.00   ??? Closed intertrochanteric fracture of hip, right, initial encounter ([place]) S72.141A   ??? Closed fracture of right wrist S62.101A       Past Medical History:        Diagnosis Date   ??? 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 Celiac artery atherosclerosis 2015    60% proximal stenosis   ??? Colon polyps     adenomatous    ??? Dyspepsia     nonulcerative   ??? Graves disease    ??? High cholesterol    ??? History of depression    ??? Hypertension    ??? 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 Migraine     recurrent, controlled with elavil   ??? Osteoarthritis of lumbar spine     [place]   ??? 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 Postmenopausal    ??? Thoracic compression fracture ([place])     T10 from a fall       Past Surgical History:        Procedure Laterality Date   ??? ARTERY BIOPSY Right 12-24-12    Temporal Artery biopsy right    ??? COLONOSCOPY  [id number]    dr Awanda Mink   3 years   ??? COLONOSCOPY N/A 07/09/2017    COLONOSCOPY POLYPECTOMY SNARE performed by Orvan Seen, MD at [place]   ??? COLONOSCOPY N/A 07/09/2017    COLONOSCOPY WITH BIOPSY performed by Orvan Seen, MD at [place]   ??? ESOPHAGEAL MOTILITY STUDY N/A 08/27/2017    ESOPHAGEAL MANOMETRY performed by Orvan Seen, MD at [place]   ??? FIXATION KYPHOPLASTY      t10   ???  SHOULDER SURGERY      left humerus fx 3 places   ??? UPPER GASTROINTESTINAL ENDOSCOPY  08/26/2014    dr Shanon Brow hess:negative   ??? UPPER GASTROINTESTINAL ENDOSCOPY N/A 08/08/2017    EGD ESOPHAGOGASTRODUODENOSCOPY performed by Orvan Seen, MD at [place] History:    Social History     Tobacco Use   ??? Smoking status: Current Some Day Smoker     Packs/day: 0.25     Years: 40.00     Pack years: 10.00     Last attempt to quit:  08/06/1997     Years since quitting: 20.3   ??? Smokeless tobacco: Never Used   ??? Tobacco comment: passive exposure husband chain smokes   Substance Use Topics   ??? Alcohol use: Yes     Alcohol/week: 0.0 oz     Comment: rarely                                Ready to quit: No  Counseling given: Yes  Comment: passive exposure husband chain smokes      Vital Signs (Current):   Vitals:    11/21/17 0819 11/21/17 2034 11/22/17 0530 11/22/17 0714   BP: 124/80 (!) 148/114 138/84 136/69   Pulse: 91 71 69 65   Resp: 16 15 18 18    Temp: 98.9 ??F (37.2 ??C) 99.2 ??F (37.3 ??C) 98.3 ??F (36.8 ??C) 98.7 ??F (37.1 ??C)   TempSrc: Oral Oral Oral Oral   SpO2: 90% 90%  93%   Weight:       Height:                                                  BP Readings from Last 3 Encounters:   11/22/17 136/69   10/21/17 136/82   09/17/17 110/78       NPO Status:    >8hrs                                                Date of last liquid consumption: 11/20/17                        Date of last solid food consumption: 11/20/17    BMI:   Wt Readings from Last 3 Encounters:   11/21/17 149 lb 11.1 oz (67.9 kg)   10/21/17 143 lb (64.9 kg)   09/17/17 130 lb 8 oz (59.2 kg)     Body mass index is 25.69 kg/m??.    CBC:   Lab Results   Component Value Date    WBC 7.3 11/22/2017    RBC 3.54 11/22/2017    HGB 11.7 11/22/2017    HCT 34.4 11/22/2017    MCV 97.2 11/22/2017    RDW 13.7 11/22/2017    PLT 148 11/22/2017       CMP:   Lab Results   Component Value Date    NA 139 11/22/2017    K 3.9 11/22/2017    K 3.7 11/21/2017    CL 103 11/22/2017    CO2 26 11/22/2017  BUN 6 11/22/2017    CREATININE <0.5 11/22/2017    GFRAA >60 11/22/2017    GFRAA >60 05/12/2010    AGRATIO 1.2 08/02/2017    LABGLOM >60 11/22/2017    GLUCOSE 118 11/22/2017    PROT 7.4 08/02/2017    PROT 7.8 05/12/2010    CALCIUM 8.8 11/22/2017    BILITOT 0.3 08/02/2017    ALKPHOS 150 08/02/2017    AST 17 08/02/2017    ALT 11 08/02/2017       POC Tests: No results for input(s): POCGLU, POCNA, POCK, POCCL,  POCBUN, POCHEMO, POCHCT in the last 72 hours.    Coags:   Lab Results   Component Value Date    PROTIME 11.2 11/21/2017    INR 0.98 11/21/2017    APTT 28.3 11/21/2017       HCG (If Applicable): No results found for: PREGTESTUR, PREGSERUM, HCG, HCGQUANT     ABGs:   Lab Results   Component Value Date    PHART 7.409 12/23/2012    PO2ART 112.0 12/23/2012    PCO2ART 34.8 12/23/2012    HCO3ART 21.6 12/23/2012    BEART -2.0 12/23/2012    O2SATART 98.4 12/23/2012        Type & Screen (If Applicable):  No results found for: LABABO, [place]    Anesthesia Evaluation  Patient summary reviewed no history of anesthetic complications:   Airway: Mallampati: II  TM distance: >3 FB   Neck ROM: full  Mouth opening: > = 3 FB Dental:      Comment: Missing teeth    Pulmonary: breath sounds clear to auscultation      (-) COPD, asthma, shortness of breath, recent URI and sleep apnea                           Cardiovascular:    (+) hypertension:, hyperlipidemia    (-) valvular problems/murmurs, past MI, CAD, CABG/stent, dysrhythmias,  angina,  CHF and murmur    ECG reviewed  Rhythm: regular  Rate: normal           Beta Blocker:  Dose within 24 Hrs         Neuro/Psych:   (+) headaches: migraine headaches, psychiatric history:   (-) seizures, neuromuscular disease, TIA and CVA           GI/Hepatic/Renal:   (+) GERD: poorly controlled,      (-) PUD, hepatitis, liver disease and no renal disease       Endo/Other:    (+) hyperthyroidism, blood dyscrasia::., .    (-) diabetes mellitus, hypothyroidism               Abdominal:           Vascular:                                      Anesthesia Plan      general     ASA 3       Induction: intravenous.    MIPS: Postoperative opioids intended and Prophylactic antiemetics administered.  Anesthetic plan and risks discussed with patient.                  This pre-anesthesia assessment may be used as a history and physical.    DOS STAFF ADDENDUM:    Pt seen and examined, chart reviewed (including  anesthesia,  drug and allergy history).  No interval changes to history and physical examination.  Anesthetic plan, risks, benefits, alternatives, and personnel involved discussed with patient.  Patient verbalized an understanding and agrees to proceed.      Stann Mainland, MD  November 22, 2017  7:34 AM    Stann Mainland, MD   11/22/2017

## 2017-11-22 NOTE — Plan of Care (Signed)
Will continue to monitor pt for improvement/worsening of condition during this hospital admission.

## 2017-11-22 NOTE — Care Coordination-Inpatient (Signed)
Following for acute rehab.  Will follow to see how she does post op with therapy and will arrange rehab admit if needed.

## 2017-11-22 NOTE — Progress Notes (Signed)
Pt sleeping, awakens to name.  Unable to answer questions at this time.

## 2017-11-22 NOTE — H&P (Signed)
Preoperative H&P Update    The patient's History and Physical in the medical record from 11/21/17 was reviewed by me today.  I reviewed the HPI, medications, allergies, reason for surgery, diagnosis and treatment plan and there has been no change.    The patient was evaluated by me today. Physical exam findings for this update include:    BP 138/84   Pulse 69   Temp 98.3 F (36.8 C) (Oral)   Resp 18   Ht 5\' 4"  (1.626 m)   Wt 149 lb 11.1 oz (67.9 kg)   SpO2 90%   BMI 25.69 kg/m   Airway is intact  Chest: breathing comfortably  Heart: regular rate  Findings on exam of the body region where surgery is to be performed include: see last office and/or consult note      Electronically signed by Richardson Chiquito, MD on 11/22/2017 at 6:55 AM

## 2017-11-22 NOTE — Brief Op Note (Signed)
Brief Postoperative Note  ______________________________________________________________    Patient: Linda [name]  Date of Birth: [birth year]  MRN: [id number]  Date of Procedure: 11/22/2017    Pre-Op Diagnosis: RIGHT INTERTROCHANTERIC HIP FRACTURE.  RIGHT DISTAL RADIUS FRACTURE    Post-Op Diagnosis: Same       Procedure(s):  RIGHT HIP GAMMA NAILING   OPEN REDUCTION INTERNAL FIXATION RIGHT RADIUS WITH C-ARM    Anesthesia: General    Surgeon(s):  Sameh Revonda Humphrey, MD  Richardson Chiquito, MD    Assistant: Rollen Sox    Estimated Blood Loss (mL): 50    Complications: None    Specimens:   * No specimens in log *    Implants:  Implant Name Type Inv. Item Serial No. Manufacturer Lot No. LRB No. Used   PLATE VOLAR DR INTRMED RT 10HL SHRT Screw/Plate/Nail/Rod PLATE VOLAR DR INTRMED RT 10HL SHRT  STRYKER: ORTHOPAEDICS  Right 1   SCREW LOCKING 2.7QQV95GL Screw/Plate/Nail/Rod SCREW LOCKING 2.8VFI43PI  STRYKER: ORTHOPAEDICS  Right 1   SCREW LOCKING 2.9JJO84ZY Screw/Plate/Nail/Rod SCREW LOCKING 2.6AYT01SW  STRYKER: ORTHOPAEDICS  Right 1   SCREW LOCKING 2.1UXN23FT Screw/Plate/Nail/Rod SCREW LOCKING 2.7DUK02RK  STRYKER: ORTHOPAEDICS  Right 4   SCREW LOCKING 2.7MMX20MM Screw/Plate/Nail/Rod SCREW LOCKING 2.7MMX20MM  STRYKER: ORTHOPAEDICS  Right 2   SCREW T8 BONE 2.2HCW23JS Screw/Plate/Nail/Rod SCREW T8 BONE 2.2GBT51VO  STRYKER: ORTHOPAEDICS  Right 1   NAIL GAM 3 TI RT 125DEG 10X360MM ST - [id number] Screw/Plate/Nail/Rod NAIL GAM 3 TI RT 125DEG 10X360MM ST [id number] S STRYKER: ORTHOPAEDICS [id number] Right 1   SCREW LAG GAM 3 TI ST 10.5X95MM - [id number] S Screw/Plate/Nail/Rod SCREW LAG GAM 3 TI ST 10.5X95MM [id number] S STRYKER: ORTHOPAEDICS [id number] Right 1   SCREW LK FTHRD ST T2 5X35MM - [id number] S Screw/Plate/Nail/Rod SCREW LK FTHRD ST T2 5X35MM [id number] S STRYKER: ORTHOPAEDICS [id number] Right 1         Drains:   External Urinary Catheter (Active)           Richardson Chiquito, MD  Date: 11/22/2017  Time: 9:30 AM

## 2017-11-22 NOTE — Progress Notes (Signed)
Occupational Therapy   Occupational Therapy Initial Assessment  Date: 11/22/2017   Patient Name: Linda [name]  MRN: [id number]     DOB: Feb 04, 1946    Date of Service: 11/22/2017    Assessment: Pt is 72 y.o. F who presents s/p mechanical fall down steps at home resulting in R intertrochanteric femur fracture and R distal radius fracture. Pt is WBAT on RLE and NWBing on RUE. PTA pt lives with husband in two story home, able to live on main floor with 1 STE. Pt reports independence in self-care, functional mobility, and homemaking responsibilities. Currently, pt presents with ROM/strength in [facility] for self-care with R wrist in splint NWBing. Pt completed bed mobility with mod Ax1 and sit <> stand transfers with min Ax2. Will trial ambulation as pt can tolerate. Anticipate pt to require mod A for ADL needs. Pt is unsafe to return home at this time and would benefit from continued high frequency therapy to increase mobility, safety and independence with self-care. Pt is motivated and hopes to get home as fast as she can.     Discharge Recommendations:  5-7 sessions per week     Kayelee Herbig Rahming scored a 15/24 on the AM-PAC ADL Inpatient form. Current research shows that an AM-PAC score of 17 or less is typically not associated with a discharge to the patient's home setting. Based on the patient???s AM-PAC score and their current ADL deficits, it is recommended that the patient have 5-7 sessions per week of Occupational Therapy at d/c to increase the patient???s independence.      Assessment   Performance deficits / Impairments: Decreased functional mobility ;Decreased strength;Decreased endurance;Decreased ADL status;Decreased balance  Assessment: Pt is 72 y.o. F who presents s/p mechanical fall down steps at home resulting in R intertrochanteric femur fracture and R distal radius fracture. Pt is WBAT on RLE and NWBing on RUE. PTA pt lives with husband in two story home, able to live on main floor with 1 STE. Pt  reports independence in self-care, functional mobility, and homemaking responsibilities. Currently, pt presents with ROM/strength in [facility] for self-care with R wrist in splint NWBing. Pt completed bed mobility with mod Ax1 and sit <> stand transfers with min Ax2. Will trial ambulation as pt can tolerate. Anticipate pt to require mod A for ADL needs. Pt is unsafe to return home at this time and would benefit from continued high frequency therapy to increase mobility, safety and independence with self-care. Pt is motivated and hopes to get home as fast as she can.   Treatment Diagnosis: impaired func mob, transfers, and ADL status   Prognosis: Good  Decision Making: Medium Complexity  History: PMH: Iron deficiency anemia, thoracic compression fx, depression  Exam: ADLs, transfers, bed mob  Assistance / Modification: min Ax2 with stedy, min/mod A for ADLs   Patient Education: Role of OT, POC, safety   REQUIRES OT FOLLOW UP: Yes  Activity Tolerance  Activity Tolerance: Patient limited by fatigue;Patient limited by pain           Patient Diagnosis(es): The primary encounter diagnosis was Closed intertrochanteric fracture of hip, right, initial encounter ([place]). Diagnoses of Fracture of radial shaft with ulna, closed, right, initial encounter and Fall down stairs, initial encounter were also pertinent to this visit.     has a past medical history of Bisphosphonate-associated osteonecrosis of the jaw [facility]), Celiac artery atherosclerosis, Colon polyps, Dyspepsia, Graves disease, High cholesterol, History of depression, Hypertension, Iron deficiency anemia secondary to  blood loss (chronic), Mass of parotid gland, Migraine, Osteoarthritis of lumbar spine, Other intestinal malabsorption, Postmenopausal, and Thoracic compression fracture ([place]).   has a past surgical history that includes shoulder surgery; Artery Biopsy (Right, 12-24-12); Upper gastrointestinal endoscopy (08/26/2014); Colonoscopy ([id number]); Colonoscopy (N/A,  07/09/2017); Colonoscopy (N/A, 07/09/2017); Upper gastrointestinal endoscopy (N/A, 08/08/2017); esophageal motility study (N/A, 08/27/2017); Fixation Kyphoplasty; Wrist fracture surgery (Right, 11/22/2017); and hip surgery (Right, 11/22/2017).    Treatment Diagnosis: impaired func mob, transfers, and ADL status       Restrictions  Restrictions/Precautions  Restrictions/Precautions: Fall Risk  Position Activity Restriction  Other position/activity restrictions: RLE WBAT, NWBing RUE    Subjective   General  Chart Reviewed: Yes  Patient assessed for rehabilitation services?: Yes  Additional Pertinent Hx: Pt is 72 y.o. F who presents s/p mechanical fall down steps at home resulting in R intertrochanteric femur fracture and R distal radius fracture. Pt is WBAT on RLE and NWBing on RUE. PMH: Iron deficiency anemia, thoracic compression fx, depression  Family / Caregiver Present: Yes(daughter at end of session)  Referring Practitioner: Bryson Corona, MD  Diagnosis: nondisplaced intertrochanteric fx of R femur  Subjective  Subjective: Pt met bedside, awoke to name. Pt agreeable for therapy evaluation and OOB activity.      Social/Functional History  Social/Functional History  Lives With: Spouse  Type of Home: House  Home Layout: Laundry in basement, Two level, Able to Live on Main level with bedroom/bathroom, Performs ADL's on one level(Pt does not have to go to second floor )  Home Access: Stairs to enter without rails  Entrance Stairs - Number of Steps: 1 or none in garage   Bathroom Shower/Tub: Administrator, Civil Service: Standard  Home Equipment: Crutches  ADL Assistance: Independent  Homemaking Assistance: Independent  Ambulation Assistance: Independent  Transfer Assistance: Teacher, English as a foreign language: Yes       Objective   Vision: Impaired  Vision Exceptions: Wears glasses for reading  Hearing: Within functional limits      Orientation  Overall Orientation Status: Within Functional Limits     Balance  Sitting Balance:  Stand by assistance  Standing Balance: Contact guard assistance  Standing Balance  Time: ~30 seconds   Activity: Transfers    Functional Mobility  Functional Mobility Comments: Deferred ambulation at this time d/t pain, used sara stedy for functional mobility at this time     ADL  Additional Comments: PTA pt reports independence in self-care. Anticipate pt to require mod A for ADL needs at this time.      Tone RUE  RUE Tone: Normotonic  Tone LUE  LUE Tone: Normotonic  Coordination  Movements Are Fluid And Coordinated: Yes  Quality of Movement Other  Comment: R wrist in splint      Bed mobility  Supine to Sit: Moderate assistance  Sit to Supine: Unable to assess(Up in chair at end of session )     Transfers  Sit to stand: 2 Person assistance  Stand to sit: 2 Person assistance  Transfer Comments: Min Ax2 for sit <> stand in sara stedy from EOB and sit to recliner chair, verbal cueing for nonuse of RUE     Cognition  Overall Cognitive Status: WFL        Sensation  Overall Sensation Status: WFL        LUE AROM (degrees)  LUE AROM : WFL  Left Hand AROM (degrees)  Left Hand AROM: WFL  RUE AROM (degrees)  RUE AROM : [employer]  Right Hand AROM (degrees)  Right Hand General AROM: Wrist in splint, can wiggle fingers     LUE Strength  Gross LUE Strength: WFL  RUE Strength  RUE Strength Comment: Proximal UE WFL, did not assess distal strength           Plan   Plan  Times per week: 3-5  Times per day: Daily  Current Treatment Recommendations: Strengthening, Therapist, nutritional, Engineer, production, Hotel manager, Proofreader, Barrister's clerk, Education, Visual merchandiser, Child psychotherapist, Self-Care / ADL    AM-PAC Score    Cullen A Tarte scored a 15/24 on the AM-PAC ADL Inpatient form. Current research shows that an AM-PAC score of 17 or less is typically not associated with a discharge to the patient's home setting. Based on the patient???s AM-PAC score and their current ADL  deficits, it is recommended that the patient have 5-7 sessions per week of Occupational Therapy at d/c to increase the patient???s independence.     AM-PAC Inpatient Daily Activity Raw Score: 15  AM-PAC Inpatient ADL T-Scale Score : 34.69  ADL Inpatient CMS 0-100% Score: 56.46  ADL Inpatient CMS G-Code Modifier : CK    Goals  Short term goals  Time Frame for Short term goals: prior to d/c  Short term goal 1: Pt will complete functional mobility and transfers with CGA  Short term goal 2: Pt will complete bathing with min A  Short term goal 3: Pt will complete dressing with min A  Short term goal 4: Pt will complete toileing with min A  Short term goal 5: Pt will complete grooming with setup   Patient Goals   Patient goals : "I want to get the best therapy to get home"       Therapy Time   Individual Concurrent Group Co-treatment   Time In       1420   Time Out       1500   Minutes       40   Timed Code Treatment Minutes: 25 Minutes(15 min eval)     If pt is discharged prior to next OT session, this note will serve as the discharge summary.    Caryl Pina Rodd, [employer]

## 2017-11-22 NOTE — Progress Notes (Signed)
To OR per bed pt. Awake and accompanied by her husband.

## 2017-11-22 NOTE — Op Note (Signed)
DATE OF SURGERY:  11/22/17    PREOPERATIVE DIAGNOSIS: Right intertrochanteric hip fracture.    POSTOPERATIVE DIAGNOSIS: Right intertrochanteric hip fracture.    PROCEDURE: Right hip intramedullary nailing.    ASSISTANT: Rollen Sox    ANESTHESIA: General.    ESTIMATED BLOOD LOSS: 50 mL.    COMPLICATIONS: None.    DISPOSITION: To PACU in good condition.    INDICATIONS FOR PROCEDURE: The patient is a 72 y.o. female  who sustained a fall onto the right side. The patient was brought to the [facility] ER and was discovered to have a right intertrochanteric hip fracture and a right distal radius fracture  The patient was subsequently admitted to the hospital for definitive treatment.  An orthopedic consultation was obtained. I recommended operative fixation of the hip and her distal radius. The patient and /or family was explained the risks, benefits, complications, alternatives of the procedure and they subsequently provided written informed consent for the procedure.      DESCRIPTION OF PROCEDURE: The patient was seen in the preoperative holding  area. The right hip was marked. The patient was seen by the Anesthesia service. The patient was then brought to the operating room.  The patient was induced under general anesthesia on the bed.  The patient was given  prophylactic preoperative IV antibiotics. The patient was then transferred onto the fracture table in the supine position. Care was taken to ensure that all  bony prominences were well padded. Dr. Su Hoff then performed the ORIF of her right distal radius.  Please see his operative note for that portion of the procedure.    After Dr. Su Hoff completed his procedure, the drapes were then taken down.   The nonoperative leg was placed in the well leg holder. The operative leg was placed in a traction boot.  We then performed a closed  reduction on the fracture table with fluoroscopy. Once we had anatomic  reduction, we then sterilely prepped and draped the  operative hip in the usual  fashion. A time-out was taken where the patient, the operative extremity and  the operative procedure were once again verified. We then made an  approximately 3 cm incision just superior to the tip of the greater  trochanter along the lateral aspect of the hip. Sharp dissection was carried  down through the skin. Blunt dissection was carried down to the tip of the  greater trochanter. We then inserted the curved awl. The awl was placed at  the tip of the greater trochanter and position was verified via fluoroscopy.   The awl was then inserted into the intramedullary canal. The ball-tip  guidewire was then passed down into the intramedullary canal to the level of  the superior pole of the patella. We then removed the awl. We then measured  the length of our wire using the measuring device. This measured for a 360 mm  nail. We then inserted the opening reamer.   We then inserted a Stryker 10 x 125 degree x gamma nail.   The nail was inserted until adequate positioning was verified via fluoroscopy.   We then inserted the drill sleeve along the lateral aspect of the thigh. An approximately 1.5 cm incision was made. Blunt dissection was carried down to the lateral aspect of the femur. The drill sleeve was then placed along the lateral aspect of the femur. The guide  pin was then placed through the femoral neck into the head after confirming  position on fluoroscopy. We  then used a step drill to drill for a lag screw.  We measured for a 95mm lag screw. We then inserted the lag screw until it  was fully seated and near the subchondral bone.  We then inserted the set screw.  The lag screw was not allowed to slide.  We then removed the lag screw insertion handle.   We then attached the distal aiming guide onto the insertion handle. Fluoroscopy was used to verify position of the drill sleeve.  A small incision was made along the lateral distal femur.  We then drilled through the femur and  measured the length for our distal locking screw.  A 35mm screw was inserted.  We then took final fluoroscopy pictures. The wounds were then copiously irrigated with normal saline solution. The subcutaneous tissue was closed with 2-0 Vicryl suture in a simple inverted interrupted fashion. The skin was then closed with staples. Xeroform gauze was then applied. Marcaine 0.5% was injected for local anesthesia. A 4 x 4 gauze, ABD pads and tape were then applied.  The patient was then awakened from general anesthesia and transferred onto  her hospital bed and transported to the PACU for recovery. The patient  tolerated the procedure well and without any complications.    PLANS: The patient will be recovered in the PACU and then be readmitted to  the floor. The patient is Weight bearing as tolerated on the operative leg. The patient will have PT and OT consult. The patient will have 24 hours of prophylactic IV antibiotics. The patient will have DVT prophylaxis.        Electronically signed by Richardson Chiquito, MD on 11/22/2017 at 9:34 AM

## 2017-11-22 NOTE — Progress Notes (Signed)
Pt arrived to PACU from OR. Pt sleeping on 4l/nc.  Right arm with ace wrap and splint noted.  Right hip dressings x 2 with small amt serosang drainage noted. +cap refill.  +pulses noted to all extremities.  VSS.

## 2017-11-23 LAB — CBC
Hematocrit: 24.5 % — ABNORMAL LOW (ref 36.0–48.0)
Hemoglobin: 8.2 g/dL — ABNORMAL LOW (ref 12.0–16.0)
MCH: 32.7 pg (ref 26.0–34.0)
MCHC: 33.3 g/dL (ref 31.0–36.0)
MCV: 98.2 fL (ref 80.0–100.0)
MPV: 7.6 fL (ref 5.0–10.5)
Platelets: 115 10*3/uL — ABNORMAL LOW (ref 135–450)
RBC: 2.5 M/uL — ABNORMAL LOW (ref 4.00–5.20)
RDW: 13.8 % (ref 12.4–15.4)
WBC: 7.4 10*3/uL (ref 4.0–11.0)

## 2017-11-23 LAB — BASIC METABOLIC PANEL
Anion Gap: 10 (ref 3–16)
BUN: 7 mg/dL (ref 7–20)
CO2: 27 mmol/L (ref 21–32)
Calcium: 8.2 mg/dL — ABNORMAL LOW (ref 8.3–10.6)
Chloride: 105 mmol/L (ref 99–110)
Creatinine: 0.5 mg/dL — ABNORMAL LOW (ref 0.6–1.2)
GFR African American: >60 (ref >60)
GFR Non-African American: >60 (ref >60)
Glucose: 105 mg/dL — ABNORMAL HIGH (ref 70–99)
Potassium: 3.9 mmol/L (ref 3.5–5.1)
Sodium: 142 mmol/L (ref 136–145)

## 2017-11-23 MED ORDER — ASPIRIN EC 325 MG PO TBEC
325 MG | ORAL_TABLET | Freq: Every day | ORAL | 0 refills | Status: DC
Start: 2017-11-23 — End: 2017-11-28

## 2017-11-23 MED ORDER — ENOXAPARIN SODIUM 40 MG/0.4ML SC SOLN
40 MG/0.4ML | INJECTION | Freq: Every day | SUBCUTANEOUS | 3 refills | Status: DC
Start: 2017-11-23 — End: 2017-11-28

## 2017-11-23 MED FILL — CEFAZOLIN 2000 MG IN D5W 100 ML IVPB: Qty: 2

## 2017-11-23 MED FILL — NADOLOL 40 MG PO TABS: 40 mg | ORAL | Qty: 1

## 2017-11-23 MED FILL — HYDROXYZINE PAMOATE 25 MG PO CAPS: 25 mg | ORAL | Qty: 1

## 2017-11-23 MED FILL — OXYCODONE-ACETAMINOPHEN 5-325 MG PO TABS: 5-325 mg | ORAL | Qty: 2

## 2017-11-23 MED FILL — FERREX 150 150 MG PO CAPS: 150 mg | ORAL | Qty: 1

## 2017-11-23 MED FILL — LOVENOX 40 MG/0.4ML SC SOLN: 40 MG/0.4ML | SUBCUTANEOUS | Qty: 0.4

## 2017-11-23 MED FILL — CALCIUM-VITAMIN D 500-200 MG-UNIT PO TABS: 500-200 MG-UNIT | ORAL | Qty: 1

## 2017-11-23 MED FILL — DOK 100 MG PO CAPS: 100 mg | ORAL | Qty: 1

## 2017-11-23 MED FILL — PAROXETINE HCL 20 MG PO TABS: 20 mg | ORAL | Qty: 1

## 2017-11-23 MED FILL — METHIMAZOLE 5 MG PO TABS: 5 mg | ORAL | Qty: 1

## 2017-11-23 MED FILL — OYSTER SHELL CALCIUM/D 500-200 MG-UNIT PO TABS: 500-200 MG-UNIT | ORAL | Qty: 1

## 2017-11-23 MED FILL — OXYCODONE-ACETAMINOPHEN 5-325 MG PO TABS: 5-325 mg | ORAL | Qty: 1

## 2017-11-23 MED FILL — DIVALPROEX SODIUM ER 500 MG PO TB24: 500 mg | ORAL | Qty: 1

## 2017-11-23 MED FILL — PANTOPRAZOLE SODIUM 40 MG PO TBEC: 40 mg | ORAL | Qty: 1

## 2017-11-23 NOTE — Discharge Instructions (Signed)
[facility] Continuity of Care Form    Patient Name:  Linda [name]  DOB: Dec 24, 1945    MRN:  [id number]    Admit date:  11/21/2017  Discharge date:  ***    Code Status Order: Full Code  Advance Directives: {YES OR NO:20022}    Admitting Physician: Linda Quails, MD  PCP: Linda Pou, MD    Discharging Nurse: [facility] Unit/Room#: K3W-3124/3124-01  Discharging Unit Phone Number: ***    Emergency Contact:        Past Surgical History:  Past Surgical History:   Procedure Laterality Date   ??? ARTERY BIOPSY Right 12-24-12    Temporal Artery biopsy right    ??? COLONOSCOPY  [id number]    Linda Awanda Mink   3 years   ??? COLONOSCOPY N/A 07/09/2017    COLONOSCOPY POLYPECTOMY SNARE performed by Linda Seen, MD at [place]   ??? COLONOSCOPY N/A 07/09/2017    COLONOSCOPY WITH BIOPSY performed by Linda Seen, MD at [place]   ??? ESOPHAGEAL MOTILITY STUDY N/A 08/27/2017    ESOPHAGEAL MANOMETRY performed by Linda Seen, MD at [place]   ??? FIXATION KYPHOPLASTY      t10   ??? HIP SURGERY Right 11/22/2017    right hip gamma nailing   ??? SHOULDER SURGERY      left humerus fx 3 places   ??? UPPER GASTROINTESTINAL ENDOSCOPY  08/26/2014    Linda Linda [name]:negative   ??? UPPER GASTROINTESTINAL ENDOSCOPY N/A 08/08/2017    EGD ESOPHAGOGASTRODUODENOSCOPY performed by Linda Seen, MD at [place]   ??? WRIST FRACTURE SURGERY Right 11/22/2017       Immunization History:   Immunization History   Administered Date(s) Administered   ??? Influenza Virus Vaccine 05/12/2012   ??? Influenza, High Dose (Fluzone 65 yrs and older) 07/11/2016, 06/19/2017   ??? Pneumococcal 13-valent Conjugate (Prevnar13) 09/07/2014   ??? Pneumococcal Polysaccharide (Pneumovax23) 12/25/2012   ??? Tdap (Boostrix, Adacel) 03/31/2012       Active Problems:  Principal Problem:    Closed intertrochanteric fracture of hip, right, initial encounter ([place])  Active Problems:    Senile osteoporosis    GERD (gastroesophageal reflux disease)    Iron deficiency anemia  secondary to blood loss (chronic)    Graves disease    Closed fracture of right wrist    Closed fracture of right distal radius  Resolved Problems:    * No resolved hospital problems. *      Isolation/Infection:       Nurse Assessment:  Last Vital Signs:BP 120/61    Pulse 58    Temp 97.7 ??F (36.5 ??C) (Oral)    Resp 18    Ht 5\' 4"  (1.626 m)    Wt 147 lb 4.3 oz (66.8 kg)    SpO2 94%    BMI 25.28 kg/m??   Last documented pain score (0-10 scale): Pain Level: 0  Last Weight:   Wt Readings from Last 1 Encounters:   11/23/17 147 lb 4.3 oz (66.8 kg)     Mental Status:  {IP PT MENTAL STATUS:20030}     IV Access:  [place] COC IV ACCESS:304088262}    Nursing Mobility/ADLs:  Walking   {CHP DME VZDG:387564332}  Transfer  {CHP DME RJJO:841660630}  Bathing  {CHP DME ZSWF:093235573}  Dressing  {CHP DME UKGU:542706237}  Toileting  {CHP DME SEGB:151761607}  Feeding  {CHP DME PXTG:626948546}  Med Admin  {CHP DME EVOJ:500938182}  Med  Delivery   Linda [name] COC MED Delivery:304088264}    Wound Care Documentation and Therapy:  Right XNA:TFTDDU dressing daily and cleanse wound with rubbing alcohol Apply dry dressing and tegaderm. Can leave wound to air on POD#7 if no drainage  If staples are in place they will need to be removed on or by POD#14. If questions about getting them removed please call Linda [name] assistant, Linda [name],  at office.  [phone]   Right wrist: Keep splint and ACE clean and dry DO NOT remove.      Elimination:  Urinary Catheter: {Urinary Catheter:304088013}   Colostomy/Ileostomy: {YES / CW:23762}  Continence:   ?? Bowel: {YES / GB:15176}  ?? Bladder: {YES / HY:07371}  Date of Last BM: ***    Intake/Output Summary (Last 24 hours)     Intake/Output Summary (Last 24 hours) at 11/23/2017 0859  Last data filed at 11/23/2017 0600  Gross per 24 hour   Intake 2268.75 ml   Output 2400 ml   Net -131.25 ml     Safety Concerns:     [place] COC Safety Concerns:304088272}    Impairments/Disabilities:      Linda [name] COC  Impairments/Disabilities:304088273}    Nutrition Therapy:  Current Nutrition Therapy: Dietary Nutrition Supplements: Standard High Calorie Oral Supplement  DIET GENERAL;  Routes of Feeding: {CHP DME Other Feedings:304088042}  Liquids: {Slp liquid thickness:30034}  Daily Fluid Restriction: {CHP DME Yes amt example:304088041}  Last Modified Barium Swallow with Video (Video Swallowing Test): {Done Not Done GGYI:948546270}    Treatments at the Time of Hospital Discharge:   Respiratory Treatments: ***  Oxygen Therapy:  {Therapy; copd oxygen:17808}  Ventilator:    {MH CC Vent JJKK:938182993}    Lab orders for discharge:        Rehab Therapies: Physical Therapy, Occupational Therapy and nursing care  Weight Bearing Status/Restrictions: NWB right hand, may use platformed walker, WBAT right leg  Other Medical Equipment (for information only, NOT a DME order): rolling walker   Other Treatments: ***    Patient's personal belongings (please select all that are sent with patient):  {CHP DME Belongings:304088044}    RN SIGNATURE:  {Esignature:304088025}    PHYSICIAN SECTION    Prognosis: Good    Condition at Discharge: Stable    Rehab Potential (if transferring to Rehab): Good    Physician Certification: I certify the above orders, information, and transfer of Linda [name] is necessary for the continuing treatment of the diagnosis listed and that he requires {Admit to Appropriate Level of Care:20763} for {GREATER/LESS:304500278} 30 days.     Update Admission H&P: No change in H&P    PHYSICIAN SIGNATURE:  Electronically signed by Linda Bourgeois, APRN - CNP on 11/23/17 at 9:00 AM/   Linda Linda [name]    CASE MANAGEMENT/SOCIAL WORK SECTION    Inpatient Status Date: ***    [facility] Readmission Risk Assessment Score:    Discharging to Facility/ Agency   ?? Name:   ?? Address:  ?? Phone:  ?? Fax:    Dialysis Facility (if applicable)   ?? Name:  ?? Address:  ?? Dialysis Schedule:  ?? Phone:  ?? Fax:    Case Manager/Social Worker signature:  {Esignature:304088025}      Lovenox for 14 days after fracture surgery followed by 14 days of Aspirin 325mg .     Followup Linda. Bridgett [name] in 2 weeks in office     [place] [facility] [place], [place], [place]   [place],   [phone]

## 2017-11-23 NOTE — Progress Notes (Signed)
Patient resting comfortably in bed with eyes closed. VSS. Bed wheels locked and bed in lowest position. Bed alarm on.  Call light in reach. Will continue to monitor.

## 2017-11-23 NOTE — Plan of Care (Signed)
Problem: Falls - Risk of:  Goal: Will remain free from falls  Description  Will remain free from falls  Outcome: Ongoing  Note:   Fall risk assessment completed .Fall precautions in place, bed/ chair alarm on, side rails 2/4 up, call light in reach, educated pt on calling for assistance when needed, room clear of clutter. Pt verbalized understanding.    Goal: Absence of physical injury  Description  Absence of physical injury  Outcome: Ongoing  Note:   Pt is free of injury. No injury noted. Fall precautions in place. Call light within reach. Will monitor.       Problem: Pain:  Goal: Pain level will decrease  Description  Pain level will decrease  Outcome: Ongoing  Note:   Pain/discomfort being managed with PRN analgesics per MD orders. Pt able to express presence and absence of pain and rate pain appropriately using numerical scale.   Goal: Control of acute pain  Description  Control of acute pain  Outcome: Ongoing  Note:   Patient educated on acute pain.  Taught patient to use call light to ask for pain medication.  PRN pain medication given for acute pain.  Will continue to monitor pain per unit protocol.    Goal: Control of chronic pain  Description  Control of chronic pain  Outcome: Ongoing  Note:   Patient educated on chronic pain.  Taught patient to use call light to ask for pain medication.  PRN pain medication given for chronic pain.  Will continue to monitor pain per unit protocol.       Problem: Risk for Impaired Skin Integrity  Goal: Tissue integrity - skin and mucous membranes  Description  Structural intactness and normal physiological function of skin and  mucous membranes.  Outcome: Ongoing  Note:   Skin assessment complete. No new signs of skin breakdown noted. Assistance provided with repositioning while in bed.

## 2017-11-23 NOTE — Progress Notes (Signed)
[facility]  Progress Note        POD # 1, s/p ORIF right distal radius, Gamma nail right femur.    Pt comfortable, no c/o.  Drsg right hip and splint right wrist D/C/I,  Mild pain with right hip ROM, no pain with fingers ROM NVI    CBC:   Lab Results   Component Value Date    WBC 7.4 11/23/2017    RBC 2.50 11/23/2017    HGB 8.2 11/23/2017    HCT 24.5 11/23/2017    MCV 98.2 11/23/2017    MCH 32.7 11/23/2017    MCHC 33.3 11/23/2017    RDW 13.8 11/23/2017    PLT 115 11/23/2017    MPV 7.6 11/23/2017     PT/INR:    Lab Results   Component Value Date    PROTIME 11.2 11/21/2017    INR 0.98 11/21/2017     PTT:    Lab Results   Component Value Date    APTT 28.3 11/21/2017   [APTT    A/P: s/p ORIF right distal radius, Gamma nail right femur.  - stable  - PT/OT, WBAT using platform walker  - Ok to D/C to ECF from ortho standpoint.  - F/U Dr Rosetta Rupnow/Chen in 2 weeks.      Vevelyn Francois , MD 11/23/2017 11:26 AM

## 2017-11-23 NOTE — Progress Notes (Signed)
IM Progress Note          CC:  Right hip and radius fractures.    Interval history:  Linda [name] is S/P right hip pinning and right radius repair.  Overall doing ok.  Mild-moderate hip and wrist pain.  No sob or chest pain.      Review of Systems:  Review of Systems   Constitutional: Positive for activity change. Negative for appetite change.   Respiratory: Negative.    Cardiovascular: Negative.    Gastrointestinal: Negative.    Musculoskeletal:        Right wrist and hip pain.            14 point ros otherwise negative.    Objective:    BP 120/61    Pulse 58    Temp 97.7 ??F (36.5 ??C) (Oral)    Resp 18    Ht 5\' 4"  (1.626 m)    Wt 147 lb 4.3 oz (66.8 kg)    SpO2 94%    BMI 25.28 kg/m??     Intake/Output Summary (Last 24 hours) at 11/23/2017 0917  Last data filed at 11/23/2017 0600  Gross per 24 hour   Intake 2268.75 ml   Output 1800 ml   Net 468.75 ml        Physical Exam   Constitutional: She is oriented to person, place, and time. She appears well-developed and well-nourished. No distress.   Neck: No JVD present.   Cardiovascular: Normal rate, regular rhythm and normal heart sounds.   Pulmonary/Chest: Effort normal. No respiratory distress. She has no rales.   Abdominal: Soft. She exhibits no distension.   Musculoskeletal: She exhibits no edema.   Neurological: She is alert and oriented to person, place, and time.   Skin: Skin is warm and dry.         CBC:   Recent Labs     11/22/17  0542 11/23/17  0744   WBC 7.3 7.4   HGB 11.7* 8.2*   PLT 148 115*     BMP:    Recent Labs     11/22/17  0542 11/23/17  0745   NA 139 142   K 3.9 3.9   CL 103 105   CO2 26 27   BUN 6* 7   CREATININE <0.5* 0.5*   GLUCOSE 118* 105*     Hepatic: No results for input(s): AST, ALT, ALB, BILITOT, ALKPHOS in the last 72 hours.  Troponin: No results for input(s): TROPONINI in the last 72 hours.  BNP: No results for input(s): BNP in the last 72 hours.  Lipids: No results for input(s): CHOL, HDL in the last 72 hours.    Invalid input(s): LDLCALCU  INR:    Recent Labs     11/21/17  0107   INR 0.98        Glucose:    Recent Labs     11/21/17  0107 11/22/17  0542 11/23/17  0745   GLUCOSE 130* 118* 105*            Assessment/Plan:    Active Hospital Problems    Diagnosis Date Noted   ??? Closed fracture of right distal radius [S52.501A]    ??? Closed intertrochanteric fracture of hip, right, initial encounter ([facility]) [S72.141A] 11/21/2017   ??? Closed fracture of right wrist [S62.101A] 11/21/2017   ??? Graves disease [E05.00] - remains on Methimazole. 09/17/2017   ??? Iron deficiency anemia secondary to blood loss (chronic) [D50.0] - on iron. 08/09/2017   ???  GERD (gastroesophageal reflux disease) [K21.9] - continue PPI    ??? Senile osteoporosis [M81.0] - calcium with vit D      Overall Linda [name] is stable 1st POD.  Anticipate transfer to rehab when improved.        Electronically signed by Galen Daft, MD on 11/23/2017 at 9:17 AM

## 2017-11-24 ENCOUNTER — Inpatient Hospital Stay
Admit: 2017-11-24 | Discharge: 2017-11-30 | Disposition: A | Discharge status: Home / Self Care | Payer: MEDICARE | Source: Ambulatory Visit | Attending: Physical Medicine & Rehabilitation | Admitting: Physical Medicine & Rehabilitation

## 2017-11-24 DIAGNOSIS — S72145D Nondisplaced intertrochanteric fracture of left femur, subsequent encounter for closed fracture with routine healing: Secondary | ICD-10-CM

## 2017-11-24 LAB — CBC
Hematocrit: 24.2 % — ABNORMAL LOW (ref 36.0–48.0)
Hemoglobin: 8.1 g/dL — ABNORMAL LOW (ref 12.0–16.0)
MCH: 32.9 pg (ref 26.0–34.0)
MCHC: 33.4 g/dL (ref 31.0–36.0)
MCV: 98.3 fL (ref 80.0–100.0)
MPV: 7.4 fL (ref 5.0–10.5)
Platelets: 135 10*3/uL (ref 135–450)
RBC: 2.46 M/uL — ABNORMAL LOW (ref 4.00–5.20)
RDW: 13.9 % (ref 12.4–15.4)
WBC: 5.6 10*3/uL (ref 4.0–11.0)

## 2017-11-24 MED ORDER — HYDRALAZINE HCL 50 MG PO TABS
50 MG | Freq: Three times a day (TID) | ORAL | Status: DC | PRN
Start: 2017-11-24 — End: 2017-11-30

## 2017-11-24 MED ORDER — PANTOPRAZOLE SODIUM 40 MG PO TBEC
40 MG | ORAL_TABLET | Freq: Every day | ORAL | 3 refills | Status: DC
Start: 2017-11-24 — End: 2018-08-15

## 2017-11-24 MED ORDER — ACETAMINOPHEN 325 MG PO TABS
325 MG | ORAL | Status: DC | PRN
Start: 2017-11-24 — End: 2017-11-30
  Administered 2017-11-27 – 2017-11-29 (×5): 650 mg via ORAL

## 2017-11-24 MED ORDER — OXYCODONE-ACETAMINOPHEN 5-325 MG PO TABS
5-325 MG | ORAL | Status: DC | PRN
Start: 2017-11-24 — End: 2017-11-30
  Administered 2017-11-24 – 2017-11-30 (×10): 2 via ORAL

## 2017-11-24 MED ORDER — ENOXAPARIN SODIUM 40 MG/0.4ML SC SOLN
40 MG/0.4ML | Freq: Every day | SUBCUTANEOUS | Status: DC
Start: 2017-11-24 — End: 2017-11-30
  Administered 2017-11-25 – 2017-11-30 (×6): 40 mg via SUBCUTANEOUS

## 2017-11-24 MED ORDER — DOCUSATE SODIUM 100 MG PO CAPS
100 MG | Freq: Two times a day (BID) | ORAL | Status: DC
Start: 2017-11-24 — End: 2017-11-25
  Administered 2017-11-25 (×2): 100 mg via ORAL

## 2017-11-24 MED ORDER — DIVALPROEX SODIUM ER 500 MG PO TB24
500 MG | ORAL_TABLET | Freq: Every evening | ORAL | 3 refills | Status: DC
Start: 2017-11-24 — End: 2018-10-27

## 2017-11-24 MED ORDER — ONDANSETRON 4 MG PO TBDP
4 MG | Freq: Three times a day (TID) | ORAL | Status: DC | PRN
Start: 2017-11-24 — End: 2017-11-30

## 2017-11-24 MED ORDER — OXYCODONE-ACETAMINOPHEN 5-325 MG PO TABS
5-325 MG | ORAL | Status: DC | PRN
Start: 2017-11-24 — End: 2017-11-30
  Administered 2017-11-27 – 2017-11-30 (×2): 1 via ORAL

## 2017-11-24 MED ORDER — POLYSACCHARIDE IRON COMPLEX 150 MG PO CAPS
150 MG | Freq: Every day | ORAL | Status: DC
Start: 2017-11-24 — End: 2017-11-30
  Administered 2017-11-25 – 2017-11-30 (×6): 150 via ORAL

## 2017-11-24 MED ORDER — CALCIUM CARBONATE-VITAMIN D 500-200 MG-UNIT PO TABS
500-200 MG-UNIT | Freq: Two times a day (BID) | ORAL | Status: DC
Start: 2017-11-24 — End: 2017-11-28
  Administered 2017-11-25 – 2017-11-28 (×7): 1 via ORAL

## 2017-11-24 MED ORDER — NORMAL SALINE FLUSH 0.9 % IV SOLN
0.9 % | Freq: Two times a day (BID) | INTRAVENOUS | Status: DC
Start: 2017-11-24 — End: 2017-11-25
  Administered 2017-11-25 (×2): 10 mL via INTRAVENOUS

## 2017-11-24 MED ORDER — DSS 100 MG PO CAPS
100 MG | ORAL_CAPSULE | Freq: Two times a day (BID) | ORAL | 0 refills | Status: DC
Start: 2017-11-24 — End: 2018-01-22

## 2017-11-24 MED ORDER — NORMAL SALINE FLUSH 0.9 % IV SOLN
0.9 % | INTRAVENOUS | Status: DC | PRN
Start: 2017-11-24 — End: 2017-11-30

## 2017-11-24 MED ORDER — DIVALPROEX SODIUM ER 500 MG PO TB24
500 MG | Freq: Every evening | ORAL | Status: DC
Start: 2017-11-24 — End: 2017-11-30
  Administered 2017-11-25 – 2017-11-30 (×6): 500 mg via ORAL

## 2017-11-24 MED ORDER — OXYCODONE-ACETAMINOPHEN 5-325 MG PO TABS
5-325 MG | ORAL_TABLET | ORAL | 0 refills | Status: DC | PRN
Start: 2017-11-24 — End: 2017-11-28

## 2017-11-24 MED ORDER — TRAZODONE HCL 50 MG PO TABS
50 MG | Freq: Every evening | ORAL | Status: DC | PRN
Start: 2017-11-24 — End: 2017-11-30
  Administered 2017-11-26 – 2017-11-27 (×2): 50 mg via ORAL

## 2017-11-24 MED ORDER — HYDROXYZINE PAMOATE 25 MG PO CAPS
25 MG | ORAL | Status: DC | PRN
Start: 2017-11-24 — End: 2017-11-30

## 2017-11-24 MED ORDER — PANTOPRAZOLE SODIUM 40 MG PO TBEC
40 MG | Freq: Every day | ORAL | Status: DC
Start: 2017-11-24 — End: 2017-11-30
  Administered 2017-11-25 – 2017-11-30 (×6): 40 mg via ORAL

## 2017-11-24 MED ORDER — PAROXETINE HCL 20 MG PO TABS
20 MG | Freq: Every day | ORAL | Status: DC
Start: 2017-11-24 — End: 2017-11-30
  Administered 2017-11-25 – 2017-11-30 (×6): 20 mg via ORAL

## 2017-11-24 MED FILL — PAROXETINE HCL 20 MG PO TABS: 20 mg | ORAL | Qty: 1

## 2017-11-24 MED FILL — LOVENOX 40 MG/0.4ML SC SOLN: 40 MG/0.4ML | SUBCUTANEOUS | Qty: 0.4

## 2017-11-24 MED FILL — DIVALPROEX SODIUM ER 500 MG PO TB24: 500 mg | ORAL | Qty: 1

## 2017-11-24 MED FILL — MORPHINE SULFATE (PF) 4 MG/ML IV SOLN: 4 mg/mL | INTRAVENOUS | Qty: 1

## 2017-11-24 MED FILL — OYSTER SHELL CALCIUM/D 500-200 MG-UNIT PO TABS: 500-200 MG-UNIT | ORAL | Qty: 1

## 2017-11-24 MED FILL — PANTOPRAZOLE SODIUM 40 MG PO TBEC: 40 mg | ORAL | Qty: 1

## 2017-11-24 MED FILL — FERREX 150 150 MG PO CAPS: 150 mg | ORAL | Qty: 1

## 2017-11-24 MED FILL — OXYCODONE-ACETAMINOPHEN 5-325 MG PO TABS: 5-325 mg | ORAL | Qty: 1

## 2017-11-24 MED FILL — OXYCODONE-ACETAMINOPHEN 5-325 MG PO TABS: 5-325 mg | ORAL | Qty: 2

## 2017-11-24 MED FILL — CALCIUM-VITAMIN D 500-200 MG-UNIT PO TABS: 500-200 MG-UNIT | ORAL | Qty: 1

## 2017-11-24 MED FILL — DOK 100 MG PO CAPS: 100 mg | ORAL | Qty: 1

## 2017-11-24 MED FILL — METHIMAZOLE 5 MG PO TABS: 5 mg | ORAL | Qty: 1

## 2017-11-24 MED FILL — NADOLOL 40 MG PO TABS: 40 mg | ORAL | Qty: 1

## 2017-11-24 NOTE — Progress Notes (Signed)
Pt A&O, cooperative with care, call light in reach and is able to make needs known. Pain /discomfort being managed with PRN analgesics per MD orders. Patient able to express presence and absence of pain and rate pain appropriately using numerical scale. Vitals signs are stable.  Intake and output are being recorded, will continue to monitor

## 2017-11-24 NOTE — Progress Notes (Signed)
Pt resting in bed this morning. States mild pain in Right hip. Foley discontinued, waiting for pt to void. Pt BP and SpO2 low this morning. Will transfer to Rehab when vitals stabilize. Pt has no questions or concerns at this time. Will monitor. Electronically signed by Sandie Ano, RN on 11/24/2017 at 10:54 AM

## 2017-11-24 NOTE — Progress Notes (Signed)
4 Eyes Skin Assessment     The patient is being assess for  Admission    I agree that 2 RN's have performed a thorough Head to Toe Skin Assessment on the patient. ALL assessment sites listed below have been assessed.       Areas assessed by both nurses: Baxter Flattery and Fredericka Bottcher  [x]    Head, Face, and Ears (scab above L eyebrow)  [x]    Shoulders, Back, and Chest  [x]    Arms, Elbows, and Hands (scattered bruising)  [x]    Coccyx, Sacrum, and IschIum  [x]    Legs, Feet, and Heels (3 surgical incisions with staples on R thigh and hip)        Does the Patient have Skin Breakdown?  No         Braden Prevention initiated:  Yes    Wound Care Orders initiated:  NA      WOC nurse consulted for Pressure Injury (Stage 3,4, Unstageable, DTI, NWPT, and Complex wounds), New and Established Ostomies:  NA      Nurse 1 eSignature: Electronically signed by Con Memos, RN on 11/24/17 at 5:16 PM    **SHARE this note so that the co-signing nurse is able to place an eSignature**    Nurse 2 eSignature: Electronically signed by Burna Sis, RN on 11/24/17 at 7:20 PM

## 2017-11-24 NOTE — Progress Notes (Signed)
[facility]  Progress Note        POD # 2, s/p ORIF right distal radius, Gamma nail right femur.    Pt comfortable, no c/o.  Drsg right hip and splint right wrist D/C/I,  Mild pain with right hip ROM, no pain with fingers ROM NVI    CBC:   Lab Results   Component Value Date    WBC 5.6 11/24/2017    RBC 2.46 11/24/2017    HGB 8.1 11/24/2017    HCT 24.2 11/24/2017    MCV 98.3 11/24/2017    MCH 32.9 11/24/2017    MCHC 33.4 11/24/2017    RDW 13.9 11/24/2017    PLT 135 11/24/2017    MPV 7.4 11/24/2017     PT/INR:    Lab Results   Component Value Date    PROTIME 11.2 11/21/2017    INR 0.98 11/21/2017     PTT:    Lab Results   Component Value Date    APTT 28.3 11/21/2017   [APTT    A/P: s/p ORIF right distal radius, Gamma nail right femur.  - stable  - PT/OT, WBAT using platform walker  - Ok to D/C to ECF from ortho standpoint.  - F/U Dr Marquetta Weiskopf/Chen in 2 weeks.      Vevelyn Francois , MD 11/24/2017 12:16 PM

## 2017-11-24 NOTE — Plan of Care (Signed)
Problem: Risk for Impaired Skin Integrity  Goal: Tissue integrity - skin and mucous membranes  Description  Structural intactness and normal physiological function of skin and  mucous membranes.  Outcome: Ongoing  Note:   Patient's skin was assessed. No new findings noted. Patient is being educated on importance of turning/repostioning at least every two hours. RN will continue to educate and monitor.       Problem: SAFETY  Goal: STG - Patient uses call light consistently to request assistance with transfers  Outcome: Ongoing  Note:   Fall risk assessment completed. Fall precautions in place. Call light within reach. Pt educated on calling for assistance before getting up. Walkway free of clutter. Will continue to monitor.       Problem: PAIN  Goal: STG - Patient will verbalize an acceptable level of pain  Outcome: Ongoing  Note:   Pt assessed for pain. Pt in pain and assessed with 0-10 pain rating scale. Pt given prescribed analgesic for pain. (See eMar) Pt satisfied with pain relief thus far. Will reassess and continue to monitor.

## 2017-11-24 NOTE — Discharge Summary (Signed)
[facility]           [address] [place], [place] [address]                               DISCHARGE SUMMARY    PATIENT NAME: Linda Linda [name], Linda A                  DOB:        1946/01/13  MED REC NO:   [id number]                          ROOM:       3124  ACCOUNT NO:   [id number]                           ADMIT DATE: 11/21/2017  PROVIDER:     Levin Bacon, MD                  DISCHARGE DATE:  11/24/2017      REASON FOR ADMISSION:  Right hip fracture and right wrist fracture.    HISTORY OF PRESENT ILLNESS:  This is Linda [name] 72 year old white female who was  at home in her usual state of health walking down the stairs yesterday  when she lost her balance and fell breaking her right hip and her right  wrist.  The patient denies any loss of consciousness.  There was no  syncope or presyncope.  She just lost her balance and had Linda [name] mechanical  fall.  Leading up to this, she had no decompensation in her underlying  health issues and she was doing quite well.  Past medical history is  significant for osteoporosis and she had thoracic compression fractures  in the past.  The patient was admitted with Orthopedic consultation for  surgical repair.    HOSPITAL COURSE:  Right wrist fracture and right hip fracture.  The  patient went to the operating room on 11/22/2017 and had an uneventful  repair of her right hip as well as her right wrist.  Please see the  operative notes for details.  Postoperatively, the patient was consulted  on by Dr. Robby Sermon from the Physical Medicine and Rehab Department.  The  patient was discharged to the inpatient rehab unit on 11/24/2017.  Her  condition on discharge was fair and she would continue with inpatient  rehab.  The remainder of the patient's chronic medical conditions were  stable during her admission.  Her hyperthyroidism and her chronic anemia  were monitored and she was stable.  Condition on discharge was fair.   Followup was with Orthopedics,  Physical Medicine and Rehab, and with Dr.  Pernell Dupre in the rehab unit.  Please see the discharge med rec form for  discharge medications.        Levin Bacon, MD    D: 12/25/2017 18:12:59       T: 12/25/2017 18:19:26     [id number]  Job#: [id number]     Doc#: [id number]    CC:

## 2017-11-24 NOTE — Progress Notes (Signed)
IM Progress Note          CC: S/P right hip and wrist fracture    Interval history: Linda [name] is doing well this morning.  More alert than yesterday. Her BP and pulse oximetry was a little low this morning but she looks fine and in no distress at all. Mild hip pain.  No chest pain or sob.      Review of Systems:  Review of Systems   Constitutional: Positive for activity change. Negative for appetite change.   Respiratory: Negative for cough, chest tightness and shortness of breath.    Cardiovascular: Positive for leg swelling. Negative for chest pain and palpitations.   Gastrointestinal: Negative.             14 point ros otherwise negative.    Objective:    BP (!) 83/54    Pulse 62    Temp 97.8 ??F (36.6 ??C) (Oral)    Resp 18    Ht 5\' 4"  (1.626 m)    Wt 148 lb 2.4 oz (67.2 kg)    SpO2 (!) 88%    BMI 25.43 kg/m??     Intake/Output Summary (Last 24 hours) at 11/24/2017 0847  Last data filed at 11/24/2017 0535  Gross per 24 hour   Intake 490 ml   Output 1025 ml   Net -535 ml        Physical Exam   Constitutional: She is oriented to person, place, and time. She appears well-developed and well-nourished. No distress.   Neck: No JVD present.   Cardiovascular: Normal rate, regular rhythm and normal heart sounds.   Pulmonary/Chest: Effort normal and breath sounds normal.   Abdominal: Soft. She exhibits no distension. There is no tenderness.   Musculoskeletal: She exhibits no edema.   Neurological: She is alert and oriented to person, place, and time.   Skin: Skin is warm and dry.         CBC:   Recent Labs     11/23/17  0744 11/24/17  0645   WBC 7.4 5.6   HGB 8.2* 8.1*   PLT 115* 135     BMP:    Recent Labs     11/22/17  0542 11/23/17  0745   NA 139 142   K 3.9 3.9   CL 103 105   CO2 26 27   BUN 6* 7   CREATININE <0.5* 0.5*   GLUCOSE 118* 105*     Hepatic: No results for input(s): AST, ALT, ALB, BILITOT, ALKPHOS in the last 72 hours.  Troponin: No results for input(s): TROPONINI in the last 72 hours.  BNP: No results for  input(s): BNP in the last 72 hours.  Lipids: No results for input(s): CHOL, HDL in the last 72 hours.    Invalid input(s): LDLCALCU  INR: No results for input(s): INR in the last 72 hours.     Glucose:    Recent Labs     11/22/17  0542 11/23/17  0745   GLUCOSE 118* 105*            Assessment/Plan:    Active Hospital Problems    Diagnosis Date Noted   ??? Closed fracture of right distal radius [S52.501A]    ??? Closed intertrochanteric fracture of hip, right, initial encounter (HCC) [S72.141A] 11/21/2017   ??? Closed fracture of right wrist [S62.101A] 11/21/2017   ??? Graves disease [E05.00] - will discontinue Methimazole (it's a small dose) because of increase in TSH. BP low so  will stop Nadolol. Follow-up TSH and free T4 in 1-2 weeks. 09/17/2017   ??? 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 GERD (gastroesophageal reflux disease) [K21.9] - stable; continue Protonix.    ??? 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      Electronically signed by Galen Daft, MD on 11/24/2017 at 8:47 AM

## 2017-11-24 NOTE — Plan of Care (Signed)
Pt assessed for fall risk and fall precautions put into place. Bed in lowest position and wheels locked, call light within reach. Nonskid footwear in place. Patient educated on appropriate method of transfer and to call for assistance. Patient remains free from intentional harm, no signs or symptoms of intention to harm noted. Will continue to monitor patient throughout shift. Pt able to communicate and speak during physical activity. Will continue to monitor patient to ensure communication is possible throughout activity; will decrease activity level if goal is not being met.

## 2017-11-24 NOTE — Progress Notes (Signed)
Pt discharged to IP rehab. Report called to Delice Bison, Charity fundraiser. Transported in hospital bed. Discharge instructions, Rx, and personal belongings given to rehab nursing. Explanation of discharge medications and instructions understood by verbal statement. No questions, comments or concerns at this time. Electronically signed by Sandie Ano, RN on 11/24/2017 at 3:37 PM

## 2017-11-24 NOTE — Consults (Signed)
AMYRA VANTUYL  11/24/2017  [id number]    Rehab Brief Consult:    Patient is able to tolerate 3 hours of therapy 5 days per week and is medically appropriate for rehab at the Acute Rehabilitation Unit. Approved for admission. Orders placed for admission.    Thank you for the consultation.    Trudee Grip, MD 11/24/2017 10:33 AM

## 2017-11-24 NOTE — Progress Notes (Signed)
Checking on patient Q2H for nutrition needs, hygiene needs, comfort measures, mobility, fall risk interventions, and safe environment. All precautions and interventions in place. Educated patient on use of call light and telephone. Patient verbalizes understanding. Call light/telephone in reach.

## 2017-11-24 NOTE — Progress Notes (Signed)
Physical Therapy    Linda [name]  11/24/2017  -plans discharge/transfer to IP Rehab unit here at [facility]  -instructed nursing to send the platform walker along with patient to Rehab unit as will need to use starting tomorrow with PT OT assessments  -meanwhile recommend use of [place] stedy for transfers  Electronically signed by Cyndy Freeze, PT on 11/24/2017 at 11:40 AM

## 2017-11-24 NOTE — Plan of Care (Signed)
Problem: Falls - Risk of:  Goal: Will remain free from falls  Description  Will remain free from falls  11/24/2017 0748 by Sandie Ano, RN  Outcome: Ongoing  Note:   Fall risk assessment completed. Fall precautions in place. Call light within reach. Pt educated on calling for assistance before getting up. Walkway free of clutter. Will continue to monitor.  Electronically signed by Sandie Ano, RN on 11/24/2017 at 7:48 AM       Problem: Falls - Risk of:  Goal: Absence of physical injury  Description  Absence of physical injury  11/24/2017 0748 by Sandie Ano, RN  Outcome: Ongoing  Note:   Pt is free of injury. No injury noted. Fall precautions in place. Call light within reach. Will monitor.  Electronically signed by Sandie Ano, RN on 11/24/2017 at 7:49 AM       Problem: Pain:  Goal: Pain level will decrease  Description  Pain level will decrease  11/24/2017 0748 by Sandie Ano, RN  Outcome: Ongoing  Note:   Pt assessed for pain. Pt denies any pain at this time. Will continue to monitor pt and assess for pain throughout rest of shift.  Electronically signed by Sandie Ano, RN on 11/24/2017 at 7:49 AM      Problem: Risk for Impaired Skin Integrity  Goal: Tissue integrity - skin and mucous membranes  Description  Structural intactness and normal physiological function of skin and  mucous membranes.  11/24/2017 0748 by Sandie Ano, RN  Outcome: Ongoing  Note:   Skin assessment completed. Surgical incisions noted. No skin breakdown noted. Pt reminded to turn and reposition frequently. Will monitor. Electronically signed by Sandie Ano, RN on 11/24/2017 at 7:50 AM

## 2017-11-25 LAB — BASIC METABOLIC PANEL
Anion Gap: 8 (ref 3–16)
BUN: 9 mg/dL (ref 7–20)
CO2: 30 mmol/L (ref 21–32)
Calcium: 8.3 mg/dL (ref 8.3–10.6)
Chloride: 99 mmol/L (ref 99–110)
Creatinine: 0.5 mg/dL — ABNORMAL LOW (ref 0.6–1.2)
GFR African American: >60 (ref >60)
GFR Non-African American: >60 (ref >60)
Glucose: 91 mg/dL (ref 70–99)
Potassium: 3.4 mmol/L — ABNORMAL LOW (ref 3.5–5.1)
Sodium: 137 mmol/L (ref 136–145)

## 2017-11-25 LAB — CBC
Hematocrit: 25.4 % — ABNORMAL LOW (ref 36.0–48.0)
Hemoglobin: 8.5 g/dL — ABNORMAL LOW (ref 12.0–16.0)
MCH: 32.8 pg (ref 26.0–34.0)
MCHC: 33.6 g/dL (ref 31.0–36.0)
MCV: 97.8 fL (ref 80.0–100.0)
MPV: 7.3 fL (ref 5.0–10.5)
Platelets: 158 10*3/uL (ref 135–450)
RBC: 2.6 M/uL — ABNORMAL LOW (ref 4.00–5.20)
RDW: 13.4 % (ref 12.4–15.4)
WBC: 5.5 10*3/uL (ref 4.0–11.0)

## 2017-11-25 MED ORDER — BISACODYL 10 MG RE SUPP
10 MG | Freq: Every day | RECTAL | Status: DC | PRN
Start: 2017-11-25 — End: 2017-11-30

## 2017-11-25 MED ORDER — POLYETHYLENE GLYCOL 3350 17 G PO PACK
17 g | Freq: Every day | ORAL | Status: DC
Start: 2017-11-25 — End: 2017-11-30
  Administered 2017-11-25 – 2017-11-27 (×3): 17 g via ORAL

## 2017-11-25 MED ORDER — SENNA-DOCUSATE SODIUM 8.6-50 MG PO TABS
Freq: Every day | ORAL | Status: DC
Start: 2017-11-25 — End: 2017-11-30
  Administered 2017-11-25 – 2017-11-28 (×4): 2 via ORAL

## 2017-11-25 MED ORDER — POTASSIUM CHLORIDE CRYS ER 20 MEQ PO TBCR
20 MEQ | Freq: Two times a day (BID) | ORAL | Status: DC
Start: 2017-11-25 — End: 2017-11-27
  Administered 2017-11-25 – 2017-11-26 (×4): 20 meq via ORAL

## 2017-11-25 MED ORDER — OXYCODONE HCL ER 10 MG PO T12A
10 MG | Freq: Two times a day (BID) | ORAL | Status: DC
Start: 2017-11-25 — End: 2017-11-26
  Administered 2017-11-25 – 2017-11-26 (×3): 10 mg via ORAL

## 2017-11-25 MED FILL — DOK 100 MG PO CAPS: 100 mg | ORAL | Qty: 1

## 2017-11-25 MED FILL — OXYCONTIN 10 MG PO T12A: 10 mg | ORAL | Qty: 1

## 2017-11-25 MED FILL — CALCIUM-VITAMIN D 500-200 MG-UNIT PO TABS: 500-200 MG-UNIT | ORAL | Qty: 1

## 2017-11-25 MED FILL — POTASSIUM CHLORIDE CRYS ER 20 MEQ PO TBCR: 20 meq | ORAL | Qty: 1

## 2017-11-25 MED FILL — PAROXETINE HCL 20 MG PO TABS: 20 mg | ORAL | Qty: 1

## 2017-11-25 MED FILL — FERREX 150 150 MG PO CAPS: 150 mg | ORAL | Qty: 1

## 2017-11-25 MED FILL — OXYCODONE-ACETAMINOPHEN 5-325 MG PO TABS: 5-325 mg | ORAL | Qty: 2

## 2017-11-25 MED FILL — LOVENOX 40 MG/0.4ML SC SOLN: 40 MG/0.4ML | SUBCUTANEOUS | Qty: 0.4

## 2017-11-25 MED FILL — DOK PLUS 50-8.6 MG PO TABS: 50-8.6 mg | ORAL | Qty: 2

## 2017-11-25 MED FILL — PEG 3350 17 G PO PACK: 17 g | ORAL | Qty: 1

## 2017-11-25 MED FILL — PANTOPRAZOLE SODIUM 40 MG PO TBEC: 40 mg | ORAL | Qty: 1

## 2017-11-25 MED FILL — DIVALPROEX SODIUM ER 500 MG PO TB24: 500 mg | ORAL | Qty: 1

## 2017-11-25 NOTE — Plan of Care (Signed)
Problem: Risk for Impaired Skin Integrity  Goal: Tissue integrity - skin and mucous membranes  Description  Structural intactness and normal physiological function of skin and  mucous membranes.  11/25/2017 0022 by Dannette Barbara, RN  Outcome: Ongoing  Patient able to reposition self in bed.  Encouraged to do so at a minimum of every two hours.  Will continue to monitor.  Patient repositions himself in chair as needed.  Will continue to monitor.   Problem: IP BLADDER/VOIDING  Goal: LTG - patient will complete bladder elimination  11/25/2017 0022 by Dannette Barbara, RN  Outcome: Ongoing     Problem: IP BLADDER/VOIDING  Goal: LTG - patient will achieve acceptable level of continence  11/25/2017 0022 by Dannette Barbara, RN  Outcome: Ongoing     Problem: IP BLADDER/VOIDING  Goal: STG - patient will be able to empty bladder  11/25/2017 0022 by Dannette Barbara, RN  Outcome: Ongoing  Patient voiding without difficulty.  Denies retention.  Will continue to monitor.  Problem: NUTRITION  Description  Altered Nutrition and Hydration  Goal: Patient maintains adequate hydration  11/25/2017 0022 by Dannette Barbara, RN  Outcome: Ongoing  Patient tolerating fluids.  Will continue to monitor.  Problem: SAFETY  Goal: LTG - patient will adhere to hip precautions during ADL's and transfers  11/25/2017 0022 by Dannette Barbara, RN  Outcome: Ongoing     Problem: SAFETY  Goal: LTG - Patient will demonstrate safety requirements appropriate to situation/environment  11/25/2017 0022 by Dannette Barbara, RN  Outcome: Ongoing     Problem: SAFETY  Goal: LTG - patient will utilize safety techniques  11/25/2017 0022 by Dannette Barbara, RN  Outcome: Ongoing     Problem: SAFETY  Goal: STG - patient locks brakes on wheelchair  11/25/2017 0022 by Dannette Barbara, RN  Outcome: Ongoing     Problem: SAFETY  Goal: STG - Patient uses call light consistently to request assistance with transfers  11/25/2017 0022 by Dannette Barbara, RN  Outcome: Ongoing     Problem: SAFETY  Goal: STG -  patient uses gait belt during all transfers  11/25/2017 0022 by Dannette Barbara, RN  Outcome: Ongoing    Pt uses call light appropriately.  While in bed side rails up 3/4.  Call light in reach. Non skid footwear in use Alarm on.  While in Chair, call light in reach, non skid footwear and alarms on.  Will continue to monitor   Problem: SKIN INTEGRITY  Goal: LTG - Patient will be free from infection  11/25/2017 0022 by Dannette Barbara, RN  Outcome: Ongoing     Problem: SKIN INTEGRITY  Goal: LTG - patient will maintain/improve skin integrity through proper skin care techniques  11/25/2017 0022 by Dannette Barbara, RN  Outcome: Ongoing     Problem: SKIN INTEGRITY  Goal: LTG - Patient will demonstrate appropriate pressure relief techniques  11/25/2017 0022 by Dannette Barbara, RN  Outcome: Ongoing     Problem: SKIN INTEGRITY  Goal: LTG - patient will demonstrate appropriate skin care techniques  11/25/2017 0022 by Dannette Barbara, RN  Outcome: Ongoing     Problem: SKIN INTEGRITY  Goal: LTG - Patient will be free from infection  11/25/2017 0022 by Dannette Barbara, RN  Outcome: Ongoing     Problem: SKIN INTEGRITY  Goal: STG - Patient demonstrates skin care/treatment/dressing change  11/25/2017 0022 by Dannette Barbara, RN  Outcome: Ongoing  Problem: SKIN INTEGRITY  Goal: STG - patient will maintain good skin integrity  11/25/2017 0022 by Dannette Barbara, RN  Outcome: Ongoing     Problem: SKIN INTEGRITY  Goal: STG - Patient exhibits signs of wound healing.  11/25/2017 0022 by Dannette Barbara, RN  Outcome: Ongoing     Problem: SKIN INTEGRITY  Goal: STG - patient demonstrates pressure reduction techniques  11/25/2017 0022 by Dannette Barbara, RN  Outcome: Ongoing     Problem: SKIN INTEGRITY  Goal: STG - Patient demonstrates preventative skin care measures  11/25/2017 0022 by Dannette Barbara, RN  Outcome: Ongoing  Patient able to reposition self in bed.  Encouraged to do so at a minimum of every two hours.  Will continue to monitor.  Patient repositions  himself in chair as needed.  Will continue to monitor.   Problem: PAIN  Goal: LTG - Patient will manage pain with the appropriate technique/Intervention  11/25/2017 0022 by Dannette Barbara, RN  Outcome: Ongoing     Problem: PAIN  Goal: LTG - Patient will demonstrate intervention for managing pain  11/25/2017 0022 by Dannette Barbara, RN  Outcome: Ongoing     Problem: PAIN  Goal: STG - Patient will verbalize an acceptable level of pain  11/25/2017 0022 by Dannette Barbara, RN  Outcome: Ongoing     Problem: PAIN  Goal: STG - patient verbalizes a reduction in pain level  11/25/2017 0022 by Dannette Barbara, RN  Outcome: Ongoing  Patient taking percocet prn pain in her right hip and right arm with relief.  Will continue to monitor.    Problem: Daily Care:  Goal: Daily care needs are met  Description  Daily care needs are met  Outcome: Ongoing  Checking on patient Q2h for nutritional needs, hygiene needs, comfort measures, mobility, fall risk interventions and safe environment.  All precautions and interventions in place . Will continue to monitor.

## 2017-11-25 NOTE — Progress Notes (Signed)
Occupational Therapy   Occupational Therapy Initial Assessment and Treatment    Date: 11/25/2017   Patient Name: Linda [name]  MRN: [id number]     DOB: [birth year]    Date of Service: 11/25/2017    Discharge Recommendations:  24 hour supervision or assist  OT Equipment Recommendations  Equipment Needed: Yes  Mobility Devices: ADL Assistive Devices  ADL Assistive Devices: Transfer Tub Bench;Toileting - Raised Toilet Seat with arms;Long-handled Sponge;Sock-Aid Hard;Reacher;Sock-Aid Jobst;Hand-held Shower(continue to assess)    Assessment   Performance deficits / Impairments: Decreased functional mobility ;Decreased strength;Decreased endurance;Decreased ADL status;Decreased balance;Decreased high-level IADLs  Assessment: Pt is 72 y.o. F who presents s/p mechanical fall down steps at home resulting in R intertrochanteric femur fracture and R distal radius fracture. Pt is WBAT on RLE and NWBing on RUE. PTA pt lives with husband in two story home, able to live on main floor with 1 STE. Pt reports independence in self-care, functional mobility, and homemaking responsibilities. Currently pt completes dressing and bathing with Mod/Max assist for LB and complete functional transfers and mobility with MinA/CGA and use of platform walker. Pt will benefit from skilled OT services at this time.   Treatment Diagnosis: impaired func mob, transfers, and ADL status   Prognosis: Good  History: PMH: Iron deficiency anemia, thoracic compression fx, depression  Exam: ADLs, transfers, bed mob  Assistance / Modification: CGA/Min A use of platform walker   Patient Education: Role of OT, POC, safety   REQUIRES OT FOLLOW UP: Yes  Activity Tolerance  Activity Tolerance: Patient Tolerated treatment well  Safety Devices  Safety Devices in place: Yes  Type of devices: Call light within reach;Left in chair;Chair alarm in place;Gait belt           Patient Diagnosis(es): There were no encounter diagnoses.     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   has a past surgical history that includes shoulder surgery; Artery Biopsy (Right, 12-24-12); Upper gastrointestinal endoscopy (08/26/2014); Colonoscopy ([id number]); Colonoscopy (N/A, 07/09/2017); Colonoscopy (N/A, 07/09/2017); Upper gastrointestinal endoscopy (N/A, 08/08/2017); esophageal motility study (N/A, 08/27/2017); Fixation Kyphoplasty; Wrist fracture surgery (Right, 11/22/2017); and hip surgery (Right, 11/22/2017).    Treatment Diagnosis: impaired func mob, transfers, and ADL status       Restrictions  Restrictions/Precautions  Restrictions/Precautions: Fall Risk  Position Activity Restriction  Other position/activity restrictions: RLE WBAT, NWBing RUE    Subjective   General  Chart Reviewed: Yes  Patient assessed for rehabilitation services?: Yes  Additional Pertinent Hx: Pt is 72 y.o. F who presents s/p mechanical fall down steps at home resulting in R intertrochanteric femur fracture and R distal radius fracture. Pt is WBAT on RLE and NWBing on RUE. PMH: Iron deficiency anemia, thoracic compression fx, depression  Family / Caregiver Present: No  Referring Practitioner: Bryson Corona, MD  Diagnosis: nondisplaced intertrochanteric fx of R femur  Subjective  Subjective: Pt agreeable to OT eval. Pt supine in bed upon arrival. Pt reports 5/10 pain in R hip and 0/10 pain in R UE. Pt reports she is unable to bear any weight through R LE due to pain.   Pain Assessment  Pain Assessment: 0-10  Pain Level: 5  Patient's Stated Pain Goal: No pain  Pain Type:  Surgical pain  Pain Location: Hip  Pain Orientation: Right  Pain Descriptors:  Sharp  Pain Frequency: Intermittent  Pain Onset: On-going  Clinical Progression: Not changed  Functional Pain Assessment: Prevents or interferes some active activities and ADLs  Non-Pharmaceutical Pain Intervention(s): Rest;Repositioned  Social/Functional History  Social/Functional History  Lives With: Spouse(71 hour supervision )  Type of Home: House  Home Layout: Laundry in basement, Two level, Able to Live on Main level with bedroom/bathroom, Performs ADL's on one level(Pt does not have to go to second floor )  Home Access: Stairs to enter without rails  Entrance Stairs - Number of Steps: 1 or 2 small steps in garage   Bathroom Shower/Tub: Administrator, Civil Service: Standard  Home Equipment: Crutches  ADL Assistance: Independent  Homemaking Assistance: Independent  Ambulation Assistance: Independent  Transfer Assistance: Independent  Active Driver: Yes  Occupation: Retired       Objective   Vision: Impaired  Vision Exceptions: Wears glasses for reading  Hearing: Within functional limits    Orientation  Overall Orientation Status: Within Functional Limits     Balance  Sitting Balance: Stand by assistance  Standing Balance: Contact guard assistance(platform walker)  Functional Mobility  Functional - Mobility Device: Platform walker  Activity: Other(few steps in bathroom)  Assist Level: Contact guard assistance  Functional Mobility Comments: increased time to complete  Ecologist - Technique: Stand step  Equipment Used: Grab bars  Toilet Transfer: Publishing copy - Transfer From: Other(platform walker)  Shower - Transfer Type: To and From  Shower - Transfer To: Shower seat with back  Shower - Technique: Programmer, systems Transfers: Minimal assistance  ADL  Grooming: Setup(seated in w/c at sink)  UE Bathing: Stand by assistance(seated)  LE Bathing: Moderate assistance(assist for R foot and LE; CGA in stance for backside)  UE Dressing: Stand by assistance(seated)  LE  Dressing: Maximum assistance(assist to thread bilateral LE; CGA in stance for pulling over hips)  Toileting: Contact guard assistance(CGA for balance in stance; pt able to manage briefs)  Tone RUE  RUE Tone: Normotonic  Tone LUE  LUE Tone: Normotonic  Coordination  Movements Are Fluid And Coordinated: Yes  Quality of Movement Other  Comment: R wrist in splint      Bed mobility  Supine to Sit: Minimal assistance(assist for trunk; to left; min cues for sequencing)  Sit to Supine: Unable to assess(up in chair at end of session)  Scooting: Stand by assistance  Comment: increased time to complete  Transfers  Stand Step Transfers: Contact guard assistance(platform walker)  Sit to stand: Minimal assistance(min/CGA)  Stand to sit: Minimal assistance(min/CGA)  Transfer Comments: to/from platform walker; max verbal cues for hand placement     Cognition  Overall Cognitive Status: WFL  Arousal/Alertness: Appropriate responses to stimuli  Following Commands: Follows all commands without difficulty  Attention Span: Appears intact  Memory: Appears intact  Safety Judgement: Good awareness of safety precautions  Insights: Fully aware of deficits        Sensation  Overall Sensation Status: WFL        LUE AROM (degrees)  LUE AROM : WFL(reports limited shoulder ROM due to previous injury but WFL)  Left Hand AROM (degrees)  Left Hand AROM: WFL  LUE Strength  Gross LUE Strength: WFL  L Hand Grasp: 4/5  RUE Strength  RUE Strength Comment: not formally tested due to NWB  Plan   Plan  Times per week: 5/7x  Times per day: Daily  Current Treatment Recommendations: Strengthening, Therapist, nutritional, Engineer, production, Hotel manager, Proofreader, Barrister's clerk, Education, Visual merchandiser, Child psychotherapist, Research officer, political party / ADL      Goals  Short term goals  Time Frame for Short term goals: 4 days  Short term goal 1: Pt will complete functional mobility and transfers with SBA  and use of AD  Short term goal 2: Pt will complete grooming in stance at sink with SBA  Short term goal 3: Pt will complete dressing and bathing SBA and use of AD as needed  Short term goal 4: Pt will complete kitchen mobility with SBA  Short term goal 5: Pt will complete toileting with SBA  Long term goals  Time Frame for Long term goals : 7 days  Long term goal 1: Pt will complete functional mobility and transfers with Mod Ind   Long term goal 2: Pt will complete grooming in stance at sink [place] term goal 3: Pt will complete dressing and bathing Mod Ind   Long term goal 4: Pt will complete toileting Mod Ind  Long term goal 5: Pt will complete simple meal prep Mod Ind  Patient Goals   Patient goals : to return home       Therapy Time   Individual Concurrent Group Co-treatment   Time In 0815         Time Out 0945         Minutes 90         Timed Code Treatment Minutes: 75 Minutes(15 minute eval)       Woodfin Ganja, OTR/L

## 2017-11-25 NOTE — Plan of Care (Signed)
Problem: Risk for Impaired Skin Integrity  Goal: Tissue integrity - skin and mucous membranes  Description  Structural intactness and normal physiological function of skin and  mucous membranes.  11/25/2017 0951 by Eusebio Me, RN  Outcome: Ongoing     Problem: IP BLADDER/VOIDING  Goal: LTG - patient will complete bladder elimination  11/25/2017 0951 by Eusebio Me, RN  Outcome: Ongoing    Goal: LTG - patient will achieve acceptable level of continence  11/25/2017 0951 by Eusebio Me, RN  Outcome: Ongoing    Goal: STG - patient will be able to empty bladder  11/25/2017 0951 by Eusebio Me, RN  Outcome: Ongoing    Problem: NUTRITION  Goal: Patient maintains adequate hydration  11/25/2017 0951 by Eusebio Me, RN  Outcome: Ongoing    Goal: Patient maintains weight  11/25/2017 0951 by Eusebio Me, RN  Outcome: Ongoing    Goal: Patient/Family demonstrates understanding of diet  11/25/2017 0951 by Eusebio Me, RN  Outcome: Ongoing    Goal: Patient/Family independently completes tube feeding  11/25/2017 0951 by Eusebio Me, RN  Outcome: Ongoing    Goal: Patient will have no more than 5 lb weight change during LOS  11/25/2017 0951 by Eusebio Me, RN  Outcome: Ongoing    Goal: Patient will utilize adaptive techniques to administer nutrition  11/25/2017 0951 by Eusebio Me, RN  Outcome: Ongoing    Goal: Patient will verbalize dietary restrictions  11/25/2017 0951 by Eusebio Me, RN  Outcome: Ongoing     Problem: SAFETY  Goal: LTG - patient will adhere to hip precautions during ADL's and transfers  11/25/2017 0951 by Eusebio Me, RN  Outcome: Ongoing    Goal: LTG - Patient will demonstrate safety requirements appropriate to situation/environment  11/25/2017 0951 by Eusebio Me, RN  Outcome: Ongoing    Goal: LTG - patient will utilize safety techniques  11/25/2017 0951 by Eusebio Me, RN  Outcome: Ongoing    Goal: STG - patient locks brakes on wheelchair  11/25/2017 0951 by Eusebio Me, RN  Outcome: Ongoing    Goal: STG - Patient uses call light consistently to request assistance with transfers  11/25/2017 0951 by Eusebio Me, RN  Outcome: Ongoing    Goal: STG - patient uses gait belt during all transfers  11/25/2017 0951 by Eusebio Me, RN  Outcome: Ongoing    Problem: SKIN INTEGRITY  Goal: LTG - Patient will be free from infection  11/25/2017 0951 by Eusebio Me, RN  Outcome: Ongoing    Goal: LTG - patient will maintain/improve skin integrity through proper skin care techniques  11/25/2017 0951 by Eusebio Me, RN  Outcome: Ongoing    Goal: LTG - Patient will demonstrate appropriate pressure relief techniques  11/25/2017 0951 by Eusebio Me, RN  Outcome: Ongoing    Goal: LTG - patient will demonstrate appropriate skin care techniques  11/25/2017 0951 by Eusebio Me, RN  Outcome: Ongoing    Goal: LTG - Patient will be free from infection  11/25/2017 0951 by Eusebio Me, RN  Outcome: Ongoing    Goal: STG - Patient demonstrates skin care/treatment/dressing change  11/25/2017 0951 by Eusebio Me, RN  Outcome: Ongoing    Goal: STG - patient will maintain good skin integrity  11/25/2017 0951 by Eusebio Me, RN  Outcome: Ongoing    Goal: STG - Patient exhibits signs of wound healing.  11/25/2017 0951 by Eusebio Me, RN  Outcome: Ongoing    Goal: STG - patient demonstrates pressure reduction techniques  11/25/2017 0951  by Eusebio Me, RN  Outcome: Ongoing    Goal: STG - Patient demonstrates preventative skin care measures  11/25/2017 0951 by Eusebio Me, RN  Outcome: Ongoing    Problem: PAIN  Goal: LTG - Patient will manage pain with the appropriate technique/Intervention  11/25/2017 0951 by Eusebio Me, RN  Outcome: Ongoing    Goal: LTG - Patient will demonstrate intervention for managing pain  11/25/2017 0951 by Eusebio Me, RN  Outcome: Ongoing  Goal: STG - Patient will reduce or eliminate use of analgesics  11/25/2017 0951 by Eusebio Me,  RN  Outcome: Ongoing    Goal: STG - pain is manageable through therapies  11/25/2017 0951 by Eusebio Me, RN  Outcome: Ongoing    Goal: STG - Patient will verbalize an acceptable level of pain  11/25/2017 0951 by Eusebio Me, RN  Outcome: Ongoing    Goal: STG - patients pain is managed to allow active participation in daily activities  11/25/2017 0951 by Eusebio Me, RN  Outcome: Ongoing    Goal: STG - Patient will increase activity level  11/25/2017 0951 by Eusebio Me, RN  Outcome: Ongoing    Goal: STG - patient verbalizes a reduction in pain level  11/25/2017 0951 by Eusebio Me, RN  Outcome: Ongoing    Problem: Daily Care:  Goal: Daily care needs are met  Description  Daily care needs are met  11/25/2017 0951 by Eusebio Me, RN  Outcome: Ongoing    Problem: Discharge Planning:  Goal: Patients continuum of care needs are met  Description  Patients continuum of care needs are met  11/25/2017 0951 by Eusebio Me, RN  Outcome: Ongoing     Problem: Infection - Surgical Site:  Goal: Will show no infection signs and symptoms  Description  Will show no infection signs and symptoms  11/25/2017 0951 by Eusebio Me, RN  Outcome: Ongoing     Problem: Mobility - Impaired:  Goal: Mobility will improve to maximum level  Description  Mobility will improve to maximum level  11/25/2017 0951 by Eusebio Me, RN  Outcome: Ongoing

## 2017-11-25 NOTE — Progress Notes (Addendum)
Patient is a 72 y/o F admitted to rehab with ORIF right radius, gamma nail right femur.  A/A/O x 4. Transfers with assist of sara stedy. On general diet, tolerating well. Medications taken whole in water .  On Lovenox for DVT prophylaxis.  Right arm wrapped in splint and ACE wrap, RLE hip incisions with mepilex border. On room air. Has been continent of urine and stool. LBM 4/19. Will monitor for safety.

## 2017-11-25 NOTE — H&P (Signed)
Department of Physical Medicine & Rehabilitation  Dr. Wyatt Haste History & Physical      Patient Identification:  Linda [name]  DOB: 1945/09/25  Admit date: 11/24/2017  Dictation date: 11/25/17   Attending provider: Sheryle Spray, MD        Primary care provider: Angelica Pou, MD       Chief Complaint:   Patient Active Problem List   Diagnosis   ??? Hyperlipidemia   ??? Senile osteoporosis   ??? Menopausal and postmenopausal disorder   ??? GERD (gastroesophageal reflux disease)   ??? Migraine headache   ??? Colon polyps   ??? Depression   ??? Intractable headache   ??? Hypoxemia   ??? Osteoporosis   ??? Intractable chronic migraine without aura   ??? Medication overuse headache   ??? Iron deficiency anemia secondary to blood loss (chronic)   ??? Other intestinal malabsorption   ??? Graves disease   ??? Closed intertrochanteric fracture of hip, right, initial encounter ([place])   ??? Closed fracture of right wrist   ??? Closed fracture of right distal radius   ??? Closed fracture of intertrochanteric section of femur, right, sequela       History of Present Illness/Hospital Course:  This is a 72 year old white female who was  at home in her usual state of health walking down the stairs yesterday  when she lost her balance and fell breaking her right hip and her right  wrist.  The patient denied any loss of consciousness.  There was no  syncope or presyncope. She has had  occasional palpitations due to her recent diagnosis of Graves disease.   She has had some iron-deficiency anemia, but now that that has improved.   Patient was taken to surgery by Dr. Bridgett Larsson for an IM nailing of her right intertrochanteric hip fracture,  and ORIF of her right wrist fracture by Dr. Barnet Pall. Postoperatively she was started therapies,   but her progress is slow due to both fractures. She needs more therapy before discharged to home safely.  Patient does live at home with her husband in a two-story house. Her hip pain is not under control, so we'll  change her pain medication at  this time.    Prior Level of Function:  Independent in self care and ADLs prior to admission.      Current Level of Function:  PT:  Transfers  Sit to Stand: Minimal Assistance;2 Person Assistance(in sara stedy with second assist to help assure safety to right wrist)  Stand to sit: Minimal Assistance;2 Person Assistance  Ambulation  Ambulation?: No(will get up to a platform walker tomorrow)    OT:??  Transfers  Sit to stand: 2 Person assistance  Stand to sit: 2 Person assistance  Transfer Comments: Min Ax2 for sit <> stand in sara stedy from EOB and sit to recliner chair, verbal cueing for nonuse of RUE  Cognition  Overall Cognitive Status: WFL         has a past medical history of Bisphosphonate-associated osteonecrosis of the jaw ([place]), Celiac artery atherosclerosis, Colon polyps, Dyspepsia, Graves disease, High cholesterol, History of depression, Hypertension, Iron deficiency anemia secondary to blood loss (chronic), Mass of parotid gland, Migraine, Osteoarthritis of lumbar spine, Other intestinal malabsorption, Postmenopausal, and Thoracic compression fracture ([place]).     reports that she has been smoking.  She has a 10.00 pack-year smoking history. She has never used smokeless tobacco. She reports that she drinks alcohol. She reports that she does not use  drugs.    family history includes Breast Cancer in her sister; Cancer in her father and mother; Diabetes in an other family member; Migraines in an other family member.        REVIEW OF SYSTEMS:   CONSTITUTIONAL: negative for fevers, chills, diaphoresis, activity change, appetite change, fatigue, night sweats and unexpected weight change.  EYES: negative for blurred vision, eye discharge, visual disturbance and icterus.  HEENT: negative for hearing loss, tinnitus, ear drainage, sinus pressure, nasal congestion, epistaxis and snoring.   RESPIRATORY: Negative for hemoptysis, cough, sputum production.   CARDIOVASCULAR: negative for chest pain, palpitations,  exertional chest pressure/discomfort, edema, syncope.   GASTROINTESTINAL: negative for nausea, vomiting, diarrhea, constipation, blood in stool and abdominal pain.+ GERD  GENITOURINARY: negative for frequency, dysuria, urinary incontinence, decreased urine volume, and hematuria.   HEMATOLOGIC/LYMPHATIC: negative for easy bruising, bleeding and lymphadenopathy.   ALLERGIC/IMMUNOLOGIC: negative for recurrent infections, angioedema, anaphylaxis and drug reactions.   ENDOCRINE: negative for weight changes and diabetic symptoms including polyuria, polydipsia and polyphagia.+ Graves' disease, osteoporosis  MUSCULOSKELETAL: negative for pain, joint swelling, decreased range of motion and muscle weakness. + lumbar spine arthritis  NEUROLOGICAL: negative for headaches, slurred speech, unilateral weakness.   PSYCHIATRIC/BEHAVIORAL: negative for hallucinations, behavioral problems, confusion and agitation. + Depression    All pertinent positives are noted in the HPI.    Physical Examination:  Vitals:   Patient Vitals for the past 24 hrs:   BP Temp Temp src Pulse Resp SpO2 Height Weight   11/25/17 0815 103/68 -- -- 53 -- -- -- --   11/25/17 0449 122/66 98 ??F (36.7 ??C) Oral 66 16 91 % -- 157 lb 13.6 oz (71.6 kg)   11/24/17 1615 (!) 100/59 98.4 ??F (36.9 ??C) Oral 58 16 91 % 5\' 4"  (1.626 m) 154 lb (69.9 kg)     Psych: Stable mood, normal judgement, normal affect   Const: No distress  Eyes: Conjunctiva noninjected, no icterus noted; pupils equal, round, and reactive to light.   HENT: Atraumatic, normocephalic; Oral mucosa moist  Neck: Trachea midline, neck supple. No thyromegaly noted.  CV: Regular rate and rhythm, no murmur rub or gallop noted  Resp: Lungs clear to auscultation bilaterally, no rales wheezes or ronchi, no retractions. Respirations unlabored.   GI: Soft, nontender, nondistended. Normal bowel sounds. No palpable masses.   Neuro: Alert, oriented, appropriate. No cranial nerve deficits appreciated. Motor examination  reveals normal strength in 2 limbs diffusely, right arm and leg not tested.   Skin: Normal temperature and turgor  MSK: No joint abnormalities noted.  right wrist splint present.     Ext: No significant edema appreciated. No varicosities.    Lab Results   Component Value Date    WBC 5.5 11/25/2017    HGB 8.5 (L) 11/25/2017    HCT 25.4 (L) 11/25/2017    MCV 97.8 11/25/2017    PLT 158 11/25/2017     Lab Results   Component Value Date    INR 0.98 11/21/2017    PROTIME 11.2 11/21/2017     Lab Results   Component Value Date    CREATININE 0.5 (L) 11/25/2017    BUN 9 11/25/2017    NA 137 11/25/2017    K 3.4 (L) 11/25/2017    CL 99 11/25/2017    CO2 30 11/25/2017     Lab Results   Component Value Date    ALT 11 08/02/2017    AST 17 08/02/2017    ALKPHOS 150 (H) 08/02/2017  BILITOT 0.3 08/02/2017       Xr Wrist Right (2 Views)    Result Date: 11/22/2017  EXAMINATION: SPOT FLUOROSCOPIC IMAGES 11/22/2017 9:34 am TECHNIQUE: Fluoroscopy was provided by the radiology department for procedure. Radiologist was not present during examination. FLUOROSCOPY DOSE AND TYPE OR TIME AND EXPOSURES: 32 seconds, 2 images COMPARISON: 11/21/2017 radiograph HISTORY: Intraprocedural imaging. FINDINGS: 2 spot images of the right wrist were obtained. Fluoroscopic assistance provided for ORIF.     Intraprocedural fluoroscopic spot images as above.  See separate procedure report for more information.     Xr Wrist Right (min 3 Views)    Result Date: 11/21/2017  EXAMINATION: 3 XRAY VIEWS OF THE RIGHT WRIST 11/21/2017 3:16 am COMPARISON: Right wrist radiographs 11/21/2017. HISTORY: ORDERING SYSTEM PROVIDED HISTORY: post reduction TECHNOLOGIST PROVIDED HISTORY: Reason for exam:->post reduction Ordering Physician Provided Reason for Exam: post reduction rt wrist Type of Exam: Subsequent/Follow-up FINDINGS: Three views provided.  Casting material in place.  Decreased bone mineral density.  Redemonstration of comminuted intra-articular distal radius fracture  which demonstrates mild impaction.  There is improved posttraumatic ulnar plus variance which now neutral and alignment.  Improved dorsal angulation with the articular surface now on the neutral position.  Stable displaced ulnar styloid process tip fracture.     Improved alignment of the intra-articular distal radial metaphyseal fracture with reduction of the posttraumatic ulnar plus variance and neutral position of the radial articular surface.     Xr Wrist Right (min 3 Views)    Result Date: 11/21/2017  EXAMINATION: XRAY VIEWS OF THE RIGHT WRIST 11/21/2017 12:59 am COMPARISON: None. HISTORY: ORDERING SYSTEM PROVIDED HISTORY: pain, deformity TECHNOLOGIST PROVIDED HISTORY: Reason for exam:->pain, deformity Ordering Physician Provided Reason for Exam: fell pain rt wrist Acuity: Acute FINDINGS: Bones are diffusely demineralized. Comminuted displaced dorsally angulated distal radial metaphyseal fracture with intra-articular extension to the radiocarpal joint. Mildly displaced distal ulnar and ulnar styloid fractures. Carpal rows are maintained.  Intact scaphoid and scapholunate interval. Diffuse soft tissue swelling about the wrist.  Osteoarthrosis about the 1st CMC joint.     Osteopenia with distal radial and ulnar fractures, as described above.     Xr Femur Right (min 2 Views)    Result Date: 11/22/2017  EXAMINATION: 4 FLUOROSCOPIC VIEWS OF THE RIGHT FEMUR 11/22/2017 9:34 am COMPARISON: None. HISTORY: ORDERING SYSTEM PROVIDED HISTORY: im nailing TECHNOLOGIST PROVIDED HISTORY: Reason for exam:->im nailing Fluoroscopy time 1 minute 18 seconds 18.7 mGy cumulative air Kerma 4 images FINDINGS: Intramedullary rod was placed across an intertrochanteric fracture.  Screws in the neck and distal shaft were placed.  Adjacent intraoperative gas is noted.  Alignment is normal.     Successful placement of intramedullary nail, reducing intertrochanteric fracture.  Please refer to procedure notes for additional details.     Ct Head Wo  Contrast    Result Date: 11/21/2017  EXAMINATION: CT OF THE HEAD WITHOUT CONTRAST  11/21/2017 12:58 am TECHNIQUE: CT of the head was performed without the administration of intravenous contrast. Dose modulation, iterative reconstruction, and/or weight based adjustment of the mA/kV was utilized to reduce the radiation dose to as low as reasonably achievable. COMPARISON: CT head 11/17/2015 HISTORY: ORDERING SYSTEM PROVIDED HISTORY: fall TECHNOLOGIST PROVIDED HISTORY: Has a "code stroke" or "stroke alert" been called?->No Ordering Physician Provided Reason for Exam: fell r/o head injury Acuity: Acute FINDINGS: BRAIN/VENTRICLES: There is no acute intracranial hemorrhage, mass effect or midline shift.  No abnormal extra-axial fluid collection.  The gray-white differentiation is maintained without evidence of  an acute infarct.  There is no evidence of hydrocephalus. Stable mild diffuse cerebral volume loss with proportionate prominence of the ventricles and sulci.  Mild patchy low attenuation in the subcortical, periventricular and deep white matter likely reflects sequela of chronic microvascular ischemia. ORBITS: The visualized portion of the orbits demonstrate no acute abnormality. SINUSES: Left maxillary sinus mucosal thickening.  Otherwise clear paranasal sinuses and mastoid air cells. SOFT TISSUES/SKULL:  Right posterior scalp contusion.  No underlying displaced calvarial or skull base fracture.     Right posterior scalp contusion.  No underlying displaced calvarial fracture or acute intracranial abnormality. Age-related cerebral atrophy and chronic microvascular ischemic disease.     Ct Cervical Spine Wo Contrast    Result Date: 11/21/2017  EXAMINATION: CT OF THE CERVICAL SPINE WITHOUT CONTRAST 11/21/2017 12:58 am TECHNIQUE: CT of the cervical spine was performed without the administration of intravenous contrast. Multiplanar reformatted images are provided for review. Dose modulation, iterative reconstruction, and/or  weight based adjustment of the mA/kV was utilized to reduce the radiation dose to as low as reasonably achievable. COMPARISON: None. HISTORY: ORDERING SYSTEM PROVIDED HISTORY: fall, pain TECHNOLOGIST PROVIDED HISTORY: Ordering Physician Provided Reason for Exam: fell r/o neck injury Acuity: Acute Relevant Medical/Surgical History: hx graves disease FINDINGS: BONES/ALIGNMENT: There is no evidence of an acute cervical spine fracture. There is normal alignment of the cervical spine. DEGENERATIVE CHANGES: Cervical spondylosis and facet arthrosis.  Mild canal narrowing at C5-C6.  Severe foraminal narrowing from C4-C6 on the right and C4-C7 the left. SOFT TISSUES: No prevertebral soft tissue swelling.  Mild smooth interlobular septal thickening along the included lung apices which may indicate mild edema.     No acute abnormality of the cervical spine. Cervical spondylosis with facet arthrosis. Interlobular septal thickening at the included lung apices suggests mild edema.     Xr Chest 1 Vw    Result Date: 11/21/2017  EXAMINATION: SINGLE XRAY VIEW OF THE CHEST 11/21/2017 1:23 am COMPARISON: Chest radiographs 07/17/2014; CT chest 07/17/2014 HISTORY: ORDERING SYSTEM PROVIDED HISTORY: fall, R rib pain TECHNOLOGIST PROVIDED HISTORY: Reason for exam:->fall, R rib pain Ordering Physician Provided Reason for Exam: fell rt rib pain Acuity: Acute FINDINGS: No pneumothorax, pleural effusion or airspace consolidation.  Normal heart size and mediastinal contours.  Gas-filled retrocardiac structure compatible with hiatal hernia, increased from prior exam.  No displaced rib fracture or any other acute osseous abnormality.  Lumbar fusion hardware partially imaged.     No rib fracture identified.  No acute cardiopulmonary findings. Large gas distended hiatal hernia, increased from prior exam.     Xr Hip 2-3 Vw W Pelvis Right    Result Date: 11/21/2017  EXAMINATION: SINGLE XRAY VIEW OF THE PELVIS AND 2 XRAY VIEWS RIGHT HIP 11/21/2017 12:59 am  COMPARISON: Abdominal radiograph from upper GI series 08/09/2017 HISTORY: ORDERING SYSTEM PROVIDED HISTORY: fall, pain TECHNOLOGIST PROVIDED HISTORY: Reason for exam:->fall, pain Ordering Physician Provided Reason for Exam: fell pain rt hip Acuity: Acute FINDINGS: Displaced right femoral intertrochanteric fracture.  Right femoral head is otherwise seated anatomically within the acetabulum.  Intact left hip.  Bony pelvis is intact as well.  Lumbosacral fusion hardware appreciated.     Right femoral intertrochanteric fracture.         The above labs and diagnostic studies have been reviewed by myself upon admission to inpatient rehabilitation.        Barriers to Discharge: Patient  has a past medical history of Bisphosphonate-associated osteonecrosis of the jaw [facility]), Celiac artery atherosclerosis,  Colon polyps, Dyspepsia, Graves disease, High cholesterol, History of depression, Hypertension, Iron deficiency anemia secondary to blood loss (chronic), Mass of parotid gland, Migraine, Osteoarthritis of lumbar spine, Other intestinal malabsorption, Postmenopausal, and Thoracic compression fracture ([place]). Pt lives at home with her husband.    POST ADMISSION PHYSICIAN EVALUATION  The patient has agreed to being admitted to our comprehensive inpatient  rehabilitation facility consisting of at least 180 minutes of therapy a day,  5 out of 7 days a week.    The patient/family has a good understanding of our discharge process. The  patient has potential to make improvement and is in need of at least two of  the following multidisciplinary therapies including but not limited to  physical, occupational, respiratory, and speech, nutritional services, wound care, and prosthetics and orthotics. Given the patients complex condition  and risk of further medical complications, rehabilitation services cannot be  safely provided at a lower level of care such as a skilled nursing facility.  I have compared the patients medical and  functional status at the time of the  preadmission screening and the same on this date, and there are no significant changes.    By signing this document, I acknowledge that I have personally performed a  full physical examination on this patient within 24 hours of admission to  this inpatient rehabilitation facility and have determined the patient to be  able to tolerate the above course of treatment at an intensive level for a  reasonable period of time. I will be completing a detailed individualized  Plan of Care for this patient by day four of the patients stay based upon the  Preadmission Screen, this Post-Admission Evaluation, and the therapy  evaluations.     Assessment and Plan:    right intertrochanteric hip fracture- repaired 4/19 with IM nailing by Dr. Bridgett Larsson, weightbearing as tolerated right leg    Right wrist fracture- repaired 4/19 with ORIF by Dr. Barnet Pall, no weightbearing right wrist.     Depression -Paxil    GERD-protonix    Graves disease    osteoporosis  - Os-Cal    osteoarthritis of her lumbar spine.     Bowels: Schedule Miralax + Senna S. Follow bowel movements. Enema or suppository if needed.     Bladder: Check PVR x 3.  [place] if PVR > 247ml or if any volume is > 500 ml.     Pain: Percocet is ordered prn.       Barth Kirks, MD, 11/25/2017, 10:22 AM

## 2017-11-25 NOTE — Progress Notes (Signed)
[facility] Note      Chief Complaint: My hip still hurts a lot    Subjective:    Events of the weekend reviewed.  Wrist and hip surgery were uneventful, but she continues to have a fair amount of hip pain.  She is constipated, but no other problems since surgery.  Eating and drinking well.  Rehabilitation to get started today.    No chest pain or shortness breath. No cough or sputum. No nausea, vomiting, diarrhea. No abdominal pain. No dysuria.  The remainder of the review of systems is negative.     PMH, PSH, FH/SH reviewed and unchanged as documented in the H&P personally documented at admission 11/21/17    Medication list reviewed    Objective:    BP 103/68    Pulse 53    Temp 98 ??F (36.7 ??C) (Oral)    Resp 16    Ht 5\' 4"  (1.626 m)    Wt 157 lb 13.6 oz (71.6 kg)    SpO2 91%    BMI 27.09 kg/m??   Temp  Avg: 98.2 ??F (36.8 ??C)  Min: 98 ??F (36.7 ??C)  Max: 98.4 ??F (36.9 ??C)    RRR  Chest-chest clear throughout, respirations easy  Abd-sounds positive, soft, nontender, nondistended  Ext- no edema, right wrist in splint.    The Following Labs Were Reviewed Today:    Recent Results (from the past 24 hour(s))   CBC    Collection Time: 11/25/17  7:57 AM   Result Value Ref Range    WBC 5.5 4.0 - 11.0 K/uL    RBC 2.60 (L) 4.00 - 5.20 M/uL    Hemoglobin 8.5 (L) 12.0 - 16.0 g/dL    Hematocrit 25.4 (L) 36.0 - 48.0 %    MCV 97.8 80.0 - 100.0 fL    MCH 32.8 26.0 - 34.0 pg    MCHC 33.6 31.0 - 36.0 g/dL    RDW 13.4 12.4 - 15.4 %    Platelets 158 135 - 450 K/uL    MPV 7.3 5.0 - 10.5 fL       ASSESSMENT/PLAN:      Principal Problem:    Closed intertrochanteric fracture of hip, right/Closed fracture of right wrist- post op day #3. Stable and starting rehabilitation. Monitor pain. Change to Senokot-S for constipation.  Active Problems:    Iron deficiency anemia secondary to blood loss (chronic)-CBC okay. Continue to monitor.    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    GERD  (gastroesophageal reflux disease)-stable    Graves disease- Tapazole stopped over the weekend. Continue to monitor thyroid function.    Hypokalemia-supplemented this morning.  Follow.    Kimba Lottes Tomasita Crumble, MD, FACP  8:37 AM  11/25/2017

## 2017-11-25 NOTE — Progress Notes (Addendum)
PHYSICAL THERAPY  Progress Note   Second Session - [place]    Patient Name: Linda [name]  Medical Record Number: [id number]           Chart Reviewed: Yes   Restrictions/Precautions: Fall Risk Other position/activity restrictions: RLE WBAT, NWBing RUE   Additional Pertinent Hx: fall on stairs resulted in an intertrochanteric femur fx R and distal radial fx. patient had surgery 4/19 with s/p gamma nail and ORIF R radius with C- arm. Patient is WBAT R LE. PMH -Grave's disease, HTN, migraines, S/P LB surgery, L humeral fx with surgery        Subjective: Patient reports pain in RLE improved after receiving pain medication.  She is agreeable to [place].    Objective    Pt required Mod A to move supine > sit, then stood from bed to platform walker with Min A, cues to avoid bearing weight through RUE, assist to correct posterior lean.  Pt then able to ambulate 120' with platform walker, slow speed, partial step-through pattern, no increase in pain, no LOB.  Pt then took seated rest.  She practiced car transfer, requiring cues for hand placement, and Min A to sit, then move RLE into car.  Pt then able to move RLE from car with Min A, then required Mod A to stand to platform walker.  Pt pivoted to wc with Min A for walker control.  PT and OT returned pt to her room.  She completed stand-pivot transfer from wc to bed with platform walker support, Min A for walker control.  She required Mod-Max A to move sit > supine, then was positioned for comfort with RUE propped on pillow, alarm in place, call light in reach.     Assessment: Patient able to ambulate increased distance with platform walker support, then practiced car transfer.  She required frequent cueing to avoid using RUE during transfers, but did well to participate throughout.  She would benefit from continued therapy to improve her strength, endurance, safety awareness, and independence performing all mobility tasks.      Safety Device - Type of devices:  [x]   All fall  risk precautions in place []  Bed alarm in place  []  Call light within reach []  Chair alarm in place []  Positioning belt [x]  Gait belt []  Patient at risk for falls []  Left in bed []  Left in chair []  Telesitter in use []  Sitter present []  Nurse notified []   None      Therapy Time   Individual Co-treatment   Time In   1440   Time Out   1515   Minutes   35       Electronically signed by Jorja Loa, PT [id number] on 11/25/2017 at 3:51 PM

## 2017-11-25 NOTE — Progress Notes (Signed)
Occupational Therapy  OCCUPATIONAL THERAPY  Progress Note   Second Session    Patient Name: Linda [name]  Medical Record Number: [id number]         General  Chart Reviewed: Yes  Patient assessed for rehabilitation services?: Yes  Additional Pertinent Hx: Pt is 72 y.o. F who presents s/p mechanical fall down steps at home resulting in R intertrochanteric femur fracture and R distal radius fracture. Pt is WBAT on RLE and NWBing on RUE. PMH: Iron deficiency anemia, thoracic compression fx, depression  Family / Caregiver Present: No  Referring Practitioner: Bryson Corona, MD  Diagnosis: nondisplaced intertrochanteric fx of R femur     Restrictions/Precautions  Restrictions/Precautions: Fall Risk        Position Activity Restriction  Other position/activity restrictions: RLE WBAT, NWBing RUE    Subjective: Patient descibe pain 7/10 on R lateral femur. Patient agreeable to therapy.    Objective:    Assessment: Pt seen for OT/PT co-tx for bed mobility, ambulation and transfers using platform RW. She needed mod A for supine<sit & scooting on EOB. She reports pelvic and R LE pain. Pt required mod VCs how to position her R UE during sit<>stand transitions and then to place onto platform. She was able to ambulate x 120' w/ min A of 1 and CGA of another for w/c follow. She completed car transfer after demo, needed lifting A to R LE in & out of car and mod A of 1, CGA of another to help position her R UE during sit<>stand transitions. She tends to keep feet too close together and requires A to guide & sequence RW throughout transfers. She was returned to her room and completed w/c<bed w/ min A of 2, gave VCs for sequencing & hand placement. She required lift A to B LEs and to scoot to [facility] w/ mod/max A. Pt is cooperative and seems to have better pain control. Cont w/ POC, tx time: 35 min Erenest Blank, OTR/L [phone]    Safety Device - Type of devices:  []   All fall risk precautions in place []  Bed alarm in place  []  Call  light within reach []  Chair alarm in place []  Positioning belt []  Gait belt []  Patient at risk for falls []  Left in bed []  Left in chair []  Telesitter in use []  Sitter present []  Nurse notified []   None      Therapy Time   Individual Co-treatment   Time In   1440   Time Out   1515   Minutes   35     Electronically signed by Viona Gilmore, OT on 11/25/2017 at 3:28 PM

## 2017-11-25 NOTE — Care Coordination-Inpatient (Signed)
[facility] Transitions Interview     11/25/2017    Patient: Linda [name] Patient DOB: 11-20-1945   MRN: [id number]  Reason for Admission:   RARS: Readmission Risk Score: 12         Spoke with:  Linda [name] (patient)      Readmission Risk  Patient Active Problem List   Diagnosis   ??? Hyperlipidemia   ??? Senile osteoporosis   ??? Menopausal and postmenopausal disorder   ??? GERD (gastroesophageal reflux disease)   ??? Migraine headache   ??? Colon polyps   ??? Depression   ??? Intractable headache   ??? Hypoxemia   ??? Osteoporosis   ??? Intractable chronic migraine without aura   ??? Medication overuse headache   ??? Iron deficiency anemia secondary to blood loss (chronic)   ??? Other intestinal malabsorption   ??? Graves disease   ??? Closed intertrochanteric fracture of hip, right, initial encounter ([place])   ??? Closed fracture of right wrist   ??? Closed fracture of right distal radius   ??? Closed fracture of intertrochanteric section of femur, right, sequela       Inpatient Assessment  Care Transitions Summary    Care Transitions Inpatient Review  Medication Review  Are you able to afford your medications?:  Yes  How often do you have difficulty taking your medications?:  I always take them as prescribed.  Housing Review  Who do you live with?:  Partner/Spouse/SO  Are you an active caregiver in your home?:  No  Social Support  Durable Medical Equipment  Patient DME:  Crutches  Functional Review  Ability to seek help/take action for Emergent/Urgent situations i.e. fire, crime, inclement weather or health crisis.:  Independent  Ability handle personal hygiene needs (bathing/dressing/grooming):  Independent  Ability to manage medications:  Independent  Ability to prepare food:  Independent  Ability to maintain home (clean home, laundry):  Independent  Ability to drive and/or has transportation:  Independent  Ability to do shopping:  Independent  Ability to manage finances:  Independent  Is patient able to live independently?:   Yes  Hearing and Vision  Visual Impairment:  Reading glasses  Hearing Impairment:  None  Care Transitions Interventions         Follow Up: Spoke with Linda [name]. She states she came to the hospital after a fall at home. Linda [name] was going down the steps - she fell - broke her right hip and wrist. Linda [name] required assistance up and was transported via EMS. Linda [name] lives with her husband. They reside in a 2 story home with a basement. There are 2 steps to enter the dwelling with no handrail. Linda [name] denies any additional falls in the past 12 months. She wears reading glasses - denies any hearing issues. Linda [name] states she has crutches at home. Currently Linda [name] is able to obtain her daily meds without difficulty. She manages them on her own and takes them directly from the bottles. Prior to this incident Linda [name] has been an active driver. She has been independent with all adl's. The laundry is in the basement - she states her husband will be doing that in the future. Linda [name]'s PCP is Dr.Sax. She went from the acute setting to inpatient rehab unit. Received nh predict tool. Discussed nh predict tool with SW. Discussed nh predict tool with patient. Provided Linda [name] with a copy of the nh predict tool & CTC contact info.  Future Appointments   Date Time Provider [place]   12/24/2017  1:00 PM Angelica Pou, MD [facility]       Health Maintenance  Health Maintenance Due   Topic Date Due   ??? Breast cancer screen  09/08/2015       Mariana Arn, RN

## 2017-11-25 NOTE — Progress Notes (Signed)
Patient is alert and oriented.  Cooperative with care.  Patient able to express her needs including pain management. Vitals stable, denies dizziness and dyspnea.  I and O's as charted.  All safety precautions in place.  Will continue to monitor.

## 2017-11-25 NOTE — Plan of Care (Signed)
ARU PATIENT TREATMENT PLAN  [facility] [place]  [address]  [phone]    Linda [name]    DOB: [birth year]  Acct #: [id number]  MRN: [id number]   PHYSICIAN:  Sheryle Spray, MD    Rehabilitation Diagnosis:    right intertrochanteric hip fracture- repaired 4/19 with IM nailing by Dr. Bridgett Larsson, weightbearing as tolerated right leg  ??  Right wrist fracture- repaired 4/19 with ORIF by Dr. Barnet Pall, no weightbearing right wrist.   ??  Depression -Paxil  ??  GERD-protonix  ??  Graves disease  ??  osteoporosis ??- [place]  ??  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   ??  Bowels: Schedule Miralax + Senna S. Follow bowel movements. Enema or suppository if needed.   ??  Bladder: Check PVR x 3.  [place] if PVR > 252ml or if any volume is > 500 ml.   ??  Pain: Percocet is ordered prn.         ADMIT DATE:11/24/2017    Patient Goals:   To return home.  Admitting Impairments: Decreased functional mobility ;Decreased strength;Decreased endurance;Decreased ADL status;Decreased balance;Decreased high-level IADLs  Barriers: NWB right wrist, decreased strength, decreased endurnace, pain, medical co-morbidities   Participation: prior to admission patient was independent and enjoyed spending time with grandchildren      CARE PLAN     NURSING:  Linda [name] while on this unit will:     [x]  Be continent of bowel and bladder     [x]  Have an adequate number of bowel movements  [x]  Urinate with no urinary retention >322ml in bladder  []  Complete bladder protocol with foley removal  [x]  Maintain O2 SATs at 92%  [x]  Have pain managed while on ARU       []  Be pain free by discharge   [x]  Have no skin breakdown while on ARU  []  Have improved skin integrity via wound measurements  [x]  Have no signs/symptoms of infection at the incision site  [x]  Be free from injury during hospitalization   [x]  Complete education with patient/family with understanding demonstrated for:  [x]  Adjustment   [x]  Other:non weight  bearing left upper extremity, smoking cessation.   Nursing interventions may include bowel/bladder training, education for medical assistive devices, medication education, O2 saturation management, energy conservation, stress management techniques, fall prevention, alarms protocol, seating and positioning, skin/wound care, pressure relief instruction,dressing changes,  infection protection, DVT prophylaxis, and/or assistance with in room safety with transfers to bed, toilet, wheelchair, shower as well as bathroom activities and hygiene.     Patient/caregiver education for:   [x]  Disease/sustained injury/management      [x]  Medication Use   [x]  Surgical intervention   [x]  Safety   [x]  Body mechanics and or joint protection   [x]  Health maintenance         PHYSICAL THERAPY:  Goals:                  Short term goals  Time Frame for Short term goals: 7 days  Short term goal 1: bed mobility at SBA  Short term goal 2: transfers at stand by assist to platform walker  Short term goal 3: ambulation with R platform wheeled walker 150' with SBA  Short term goal 4: ascend/descend curb step with min/CGA            Long term goals  Time Frame for Long term goals : 10 days  Long term goal 1: bed mobility at MI  Long term goal 2: transfers at MI with platform walker  Long term goal 3: ambulation with R platform wheeled walker 150' with MI  Long term goal 4: ascend/descend curb step with CGA  Long term goal 5: ascend /descend 4 steps with CGA and assist of another  These goals were reviewed with this patient at the time of assessment and Linda [name] is in agreement.     Plan of Care: Pt to be seen 90   mins per day for 5-6 day/week 1 week - 10 days.                   Current Treatment Recommendations: Strengthening, ROM, Hotel manager, Therapist, nutritional, Training and development officer, Psychologist, clinical, Engineer, production, Stair training, Pain Management, Home Exercise Program, Child psychotherapist,  Human resources officer, Personnel officer, Proofreader, Barrister's clerk, Education, Visual merchandiser      OCCUPATIONAL THERAPY:  Goals:             Short term goals  Time Frame for Short term goals: 4 days  Short term goal 1: Pt will complete functional mobility and transfers with SBA and use of AD  Short term goal 2: Pt will complete grooming in stance at sink with SBA  Short term goal 3: Pt will complete dressing and bathing SBA and use of AD as needed  Short term goal 4: Pt will complete kitchen mobility with SBA  Short term goal 5: Pt will complete toileting with SBA :  Long term goals  Time Frame for Long term goals : 7 days  Long term goal 1: Pt will complete functional mobility and transfers with Mod Ind   Long term goal 2: Pt will complete grooming in stance at sink [place] term goal 3: Pt will complete dressing and bathing Mod Ind   Long term goal 4: Pt will complete toileting Mod Ind  Long term goal 5: Pt will complete simple meal prep Mod Ind :    These goals were reviewed with this patient at the time of assessment and Linda [name] is in agreement    Plan of Care:  Pt to be seen 90   mins per day for 5 day/week 1 week.    Patient Education: Role of OT, POC, safety       SPEECH THERAPY: Goals will be left blank if speech is not following this patient.  Goals:                                                                       CASE MANAGEMENT:  Goals:   Assist patient/family with discharge planning, patient/family counseling,   and coordination with insurance during ARU stay.      Admission Period/Goal FIM SCORES  FIM Admit/Goal Score   Eating/Swallowing 5/7   Grooming 5/6   Bathing 3/6   Upper Body Dressing 5/7   Lower Body Dressing 2/6   Toileting 2/6   Bladder LoA, Accidents = FIM 5/7   Bowel  LoA, Accidents = FIM 6/6   Bed/Chair Transfer 1/6   Toilet Transfer 1/6   Tub/Shower Transfer  2/6   Locomotion:  Ambulation (  Walk) / Wheelchair:  W = walk , w/c = wheelchair  Distance:   1= 0-49  ft , 2= 50-149 ft, 3= 150+ ft Distance: 1   Level of Assist: 1   Mode: w   FIM: 1/6      Stairs 0/2   Comprehension 5/6   Expression 5/6   Social Interaction 5/6   Problem Solving 4/6   Memory 5/6        Linda [name] will be seen a minimum of 3 hours of therapy per day, a minimum of 5 out of 7 days per week.    []  In this rare instance due to the nature of this patient's medical involvement, this patient will be seen 15 hours per week (900 minutes within a 7 day period).    Treatments may include therapeutic exercises, gait training, neuromuscular re-ed, transfer training, community reintegration, bed mobility, w/c mobility and training, self care, home mgmt, cognitive training, energy conservation,dysphagia tx, speech/language/communication therapy, group therapy, and patient/family education. In addition, dietician/nutritionist may monitor calorie count as well as intake and collaboratively work with SLP on dietary upgrades.  Neuropsychology/Psychology may evaluate and provide necessary support.    Medical issues being managed closely and that require 24 hour availability of a physician:   []  Swallowing Precautions  []  Bowel/Bladder Fx  []  Weight bearing precautions   [x]  Wound Care    [x]  Pain Mgmt   [x]  Infection Protection   [x]  DVT Prophylaxis   [x]  Fall Precautions  [x]  Fluid/Electrolyte/Nutrition Balance   []  Voice Protection   []  Respiratory  []  Other:    Medical Prognosis: []  Good  [x]  Fair    []  Guarded   Total expected IRF days 7-10  Anticipated discharge destination:    []  Home Independently   [x]  Home Modified Independent  []  Home with supervision    [] SNF     []  Other                                           Physician anticipated functional outcomes:  Improve gait, transfers, self care to modified independent to allow safe return home     IPOC brief synthesis: This is a 72 year old white female who was  at home in her usual state of health walking down the stairs yesterday  when she lost her  balance and fell breaking her right hip and her right  wrist. ??The patient denied any loss of consciousness. ??There was no  syncope or presyncope. She has had  occasional palpitations due to her recent diagnosis of Graves disease.   She has had some iron-deficiency anemia, but now that that has improved.   Patient was taken to surgery by Dr. Bridgett Larsson for an IM nailing of her right intertrochanteric hip fracture,  and ORIF of her right wrist fracture by Dr. Barnet Pall. Postoperatively she was started therapies,   but her progress is slow due to both fractures. She needs more therapy before discharged to home safely.  Patient does live at home with her husband in a two-story house. Her hip pain is not under control, so we'll  change her pain medication at this time.      I have reviewed this initial plan of care and agree with its contents:    Title   Name    Date    Time    Physician: Dr.  Heis, 11/26/17, 8 AM    Case Mgmt:  Mardella Layman, [place]     Case Management   [phone]    11/26/2017  4:16 PM      OT: Woodfin Ganja, OTR/L    [id number] signed by Deatra James PT [id number] on 11/25/2017 at 4:45 PM    RN: Tilden Fossa RN, CRRN 11/25/2017 11:28 am    ST:    Program Director: Warnell Forester  11/27/2017  1547    Other:

## 2017-11-25 NOTE — Progress Notes (Signed)
Physical Therapy    Facility/Department: [facility]  Initial Assessment    NAME: NAIESHA HARVICK  DOB: 1945/12/08  MRN: [id number]    Date of Service: 11/25/2017    Discharge Recommendations:  Continue to assess pending progress   PT Equipment Recommendations  Equipment Needed: Yes  Mobility Devices: Lyda Perone: Platform Right;Rolling    Assessment   Body structures, Functions, Activity limitations: Decreased functional mobility ;Decreased strength;Decreased ADL status;Decreased balance;Decreased endurance;Decreased ROM;Increased Pain  Assessment: Patient is a 72 y/o female with S/P gamma nail of R femur and ORIF R radius with c-arm on 11/22/17. Patient s/p fall on stairs and was independent with no device prior to fall. Patient at this requires minimal assist for transfers using a R platform wheeled walker, ambulates 30' with firm CGA with limited WB through R LE and bed mobility with minimal assist for R LE. Patient limited by pain , weakness and decreased endurance. patient will benefit from ARU to improve overall independence with mobility.  Prognosis: Good  Decision Making: Medium Complexity  History: PMH -Grave's disease, HTN, migraines, S/P LB surgery, L humeral fx with surgery  Exam: ROM, strength, balance, mobility   Clinical Presentation: evolving  Patient Education: rehab expectations and goals, importance of exercises  REQUIRES PT FOLLOW UP: Yes  Activity Tolerance  Activity Tolerance: Patient limited by endurance;Patient Tolerated treatment well       Patient Diagnosis(es): There were no encounter diagnoses.     has a past medical history of Bisphosphonate-associated osteonecrosis of the jaw [facility]), Celiac artery atherosclerosis, Colon polyps, Dyspepsia, Graves disease, High cholesterol, History of depression, Hypertension, Iron deficiency anemia secondary to blood loss (chronic), Mass of parotid gland, Migraine, Osteoarthritis of lumbar spine, Other intestinal malabsorption, Postmenopausal,  and Thoracic compression fracture (HCC).   has a past surgical history that includes shoulder surgery; Artery Biopsy (Right, 12-24-12); Upper gastrointestinal endoscopy (08/26/2014); Colonoscopy ([id number]); Colonoscopy (N/A, 07/09/2017); Colonoscopy (N/A, 07/09/2017); Upper gastrointestinal endoscopy (N/A, 08/08/2017); esophageal motility study (N/A, 08/27/2017); Fixation Kyphoplasty; Wrist fracture surgery (Right, 11/22/2017); and hip surgery (Right, 11/22/2017).    Restrictions  Restrictions/Precautions  Restrictions/Precautions: Fall Risk  Position Activity Restriction  Other position/activity restrictions: RLE WBAT, NWBing RUE  Vision/Hearing  Vision: Impaired  Vision Exceptions: Wears glasses for reading  Hearing: Within functional limits     Subjective  General  Chart Reviewed: Yes  Patient assessed for rehabilitation services?: Yes  Additional Pertinent Hx: fall on stairs resulted in an intertrochanteric femur fx R and distal radial fx. patient had surgery 4/19 with s/p gamma nail and ORIF R radius with C- arm. Patient is WBAT R LE. PMH -Grave's disease, HTN, migraines, S/P LB surgery, L humeral fx with surgery  Response To Previous Treatment: Not applicable  Family / Caregiver Present: No  Referral Date : 11/24/17  Diagnosis: R Inter trochanteric femur fx, distal radial fx, s/p gamma nail , ORIF R radius with C arm  Follows Commands: Within Functional Limits  Subjective  Subjective: Patient descibe pain 7/10 on R lateral femur. Patient agreeable to therapy.          Orientation  Orientation  Overall Orientation Status: Within Functional Limits  Social/Functional History  Social/Functional History  Lives With: Spouse(24 hour supervision )  Type of Home: House  Home Layout: Laundry in basement, Two level, Able to Live on Main level with bedroom/bathroom, Performs ADL's on one level(Pt does not have to go to second floor )  Home Access: Stairs to enter without rails  Entrance Stairs - Number of Steps: 1 or 2 small  steps in garage   Bathroom Shower/Tub: Medical sales representative: Standard  Home Equipment: Crutches  ADL Assistance: Independent  Homemaking Assistance: Independent  Ambulation Assistance: Independent  Transfer Assistance: Independent  Active Driver: Yes  Occupation: Retired  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  Leisure & Hobbies: visit grandchildren    Objective     Observation/Palpation  Observation: soft cast R forarm, IV L arm    AROM RLE (degrees)  RLE AROM: Exceptions  RLE General AROM: Knee extension lacks 20 degrees, flexion 90, Hip flexion supine AROM 50 degrees, abduction 5 degrees, ankle WFL  AROM LLE (degrees)  LLE AROM : WFL  Strength RLE  Strength RLE: Exception  Comment: hip flexion 3-/5, hip abduction 2-/5, knee extension 3+/5 with limited extension, knee flexion 3+/5, ankle WFL  Strength LLE  Strength LLE: WFL  Tone RLE  RLE Tone: Normotonic  Tone LLE  LLE Tone: Normotonic  Copy  Gross Motor?: WFL  Sensation  Overall Sensation Status: WFL  Bed mobility  Rolling to Left: Stand by assistance  Rolling to Right: Unable to assess(too much pain)  Supine to Sit: Minimal assistance;Contact guard assistance(from L side)  Sit to Supine: Minimal assistance(aissiste with LE)  Scooting: Stand by assistance  Comment: increased time to complete  Transfers  Sit to Stand: Minimal Assistance;Contact guard assistance(with R platform wheeled walker)  Stand to sit: Contact guard assistance(cues for hand placement and to extend R knee)  Comment: car transfers refused due to pain  Ambulation  Ambulation?: Yes  Ambulation 1  Surface: level tile  Device: Platform Walker right  Assistance: Contact guard assistance  Quality of Gait: Patient with antalgic gait, decreased WB R LE, step to gait pattern with slow cadence  Distance: 15', 30'  Comments: c/o of lightheadedness at end of distance  Stairs/Curb  Stairs?: No(not safe as yet)     Balance  Posture: Good  Sitting - Static: Good  Sitting - Dynamic: Good  Standing  - Static: Fair;+  Standing - Dynamic: Fair  Comments: with R platform wheeled walker  Exercises  Quad Sets: 10  Heelslides: 10 with assist   Gluteal Sets: 10  Hip Abduction: adductor sets x 10, abduction x 10 with assist  Knee Short Arc Quad: 20  Ankle Pumps: 20  Comments: encouraged to perform deep breathing exercises  Other exercises  Other exercises?: No     Plan   Plan  Times per week: 5-6x   Times per day: Twice a day  Plan weeks: 7-10 days  Current Treatment Recommendations: Strengthening, ROM, Location manager, Building services engineer, Teacher, early years/pre, Archivist, Teaching laboratory technician, Stair training, Pain Management, Home Exercise Program, Equities trader, Glass blower/designer, Investment banker, operational, Patent attorney, Mining engineer, Education, & procurement  Safety Devices  Type of devices: All fall risk precautions in place, Gait belt, Left in bed, Call light within reach, Bed alarm in place, Nurse notified  Restraints  Initially in place: No  Goals  Short term goals  Time Frame for Short term goals: 7 days  Short term goal 1: bed mobility at SBA  Short term goal 2: transfers at stand by assist to platform walker  Short term goal 3: ambulation with R platform wheeled walker 150' with SBA  Short term goal 4: ascend/descend curb step with min/CGA  Long term goals  Time Frame for Long term goals : 10 days  Long term goal 1: bed  mobility at MI  Long term goal 2: transfers at MI with platform walker  Long term goal 3: ambulation with R platform wheeled walker 150' with MI  Long term goal 4: ascend/descend curb step with CGA  Long term goal 5: ascend /descend 4 steps with CGA and assist of another  Patient Goals   Patient goals : to go home and be able to help huband with cooking ect       Therapy Time   Individual Concurrent Group Co-treatment   Time In 1030         Time Out 1200         Minutes 90         Timed Code Treatment Minutes: 75 Minutes       Donneta Romberg, PT #  [phone]

## 2017-11-25 NOTE — Progress Notes (Signed)
Nutrition Assessment (Low Risk)    Type and Reason for Visit: Initial, Consult    Nutrition Recommendations:   Monitor meal and supplement intake  Change supplement from bid to daily per pt request  Monitor weight, labs, bowels     Nutrition Assessment:  Patient assessed for nutritional risk.  Deemed to be at low risk at this time.  Will continue to monitor for changes in status. Pt appears adequately nourished.  She does not have the best habits at home, but has no intention of changing that Pt was agreable to vanilla Ensure Enlive to aid with healing.     Malnutrition Assessment:  ?? Malnutrition Status: No malnutrition    Nutrition Risk Level  ??? Risk Level: Low    Nutrition Diagnosis:   ?? Problem: Increased nutrient needs  ?? Etiology: Insufficient energy/nutrient consumption   ??? Signs and symptoms: Presence of wounds    Nutrition Intervention:  Food and/or Delivery: Continue current diet, Continue current ONS  Nutrition Education/Counseling/Coordination of Care:  Continued Inpatient Monitoring      Electronically signed by Ernest Pine, RD, LD on 11/25/17 at 5:53 PM    Contact Number: [phone]

## 2017-11-26 MED ORDER — OXYCODONE HCL ER 10 MG PO T12A
10 MG | Freq: Two times a day (BID) | ORAL | Status: DC
Start: 2017-11-26 — End: 2017-11-30
  Administered 2017-11-27 – 2017-11-30 (×8): 20 mg via ORAL

## 2017-11-26 MED FILL — CALCIUM-VITAMIN D 500-200 MG-UNIT PO TABS: 500-200 MG-UNIT | ORAL | Qty: 1

## 2017-11-26 MED FILL — LOVENOX 40 MG/0.4ML SC SOLN: 40 MG/0.4ML | SUBCUTANEOUS | Qty: 0.4

## 2017-11-26 MED FILL — OXYCODONE-ACETAMINOPHEN 5-325 MG PO TABS: 5-325 mg | ORAL | Qty: 2

## 2017-11-26 MED FILL — OXYCONTIN 10 MG PO T12A: 10 mg | ORAL | Qty: 1

## 2017-11-26 MED FILL — DIVALPROEX SODIUM ER 500 MG PO TB24: 500 mg | ORAL | Qty: 1

## 2017-11-26 MED FILL — POTASSIUM CHLORIDE CRYS ER 20 MEQ PO TBCR: 20 meq | ORAL | Qty: 1

## 2017-11-26 MED FILL — FERREX 150 150 MG PO CAPS: 150 mg | ORAL | Qty: 1

## 2017-11-26 MED FILL — PAROXETINE HCL 20 MG PO TABS: 20 mg | ORAL | Qty: 1

## 2017-11-26 MED FILL — DOK PLUS 50-8.6 MG PO TABS: 50-8.6 mg | ORAL | Qty: 2

## 2017-11-26 MED FILL — PEG 3350 17 G PO PACK: 17 g | ORAL | Qty: 1

## 2017-11-26 MED FILL — PANTOPRAZOLE SODIUM 40 MG PO TBEC: 40 mg | ORAL | Qty: 1

## 2017-11-26 MED FILL — TRAZODONE HCL 50 MG PO TABS: 50 mg | ORAL | Qty: 1

## 2017-11-26 NOTE — Progress Notes (Signed)
Physical Therapy  Facility/Department: [facility]  Daily Treatment Note  NAME: Linda [name]  DOB: 09/06/1945  MRN: [id number]    Date of Service: 11/26/2017    Discharge Recommendations:  Continue to assess pending progress   PT Equipment Recommendations  Equipment Needed: Yes  Walker: Platform Right;Rolling    Patient Diagnosis(es): There were no encounter diagnoses.     has a past medical history of Bisphosphonate-associated osteonecrosis of the jaw [facility]), Celiac artery atherosclerosis, Colon polyps, Dyspepsia, Graves disease, High cholesterol, History of depression, Hypertension, Iron deficiency anemia secondary to blood loss (chronic), Mass of parotid gland, Migraine, Osteoarthritis of lumbar spine, Other intestinal malabsorption, Postmenopausal, and Thoracic compression fracture (HCC).   has a past surgical history that includes shoulder surgery; Artery Biopsy (Right, 12-24-12); Upper gastrointestinal endoscopy (08/26/2014); Colonoscopy ([id number]); Colonoscopy (N/A, 07/09/2017); Colonoscopy (N/A, 07/09/2017); Upper gastrointestinal endoscopy (N/A, 08/08/2017); esophageal motility study (N/A, 08/27/2017); Fixation Kyphoplasty; Wrist fracture surgery (Right, 11/22/2017); hip surgery (Right, 11/22/2017); Hip fracture surgery (Right, 11/22/2017); and ORIF DISTAL RADIUS FRACTURE (Right, 11/22/2017).    Restrictions  Restrictions/Precautions  Restrictions/Precautions: Fall Risk  Position Activity Restriction  Other position/activity restrictions: RLE WBAT, NWBing RUE  Subjective   General  Chart Reviewed: Yes  Additional Pertinent Hx: fall on stairs resulted in an intertrochanteric femur fx R and distal radial fx. patient had surgery 4/19 with s/p gamma nail and ORIF R radius with C- arm. Patient is WBAT R LE. PMH -Grave's disease, HTN, migraines, S/P LB surgery, L humeral fx with surgery  Response To Previous Treatment: Patient with no complaints from previous session.  Family / Caregiver Present:  No  Subjective  Subjective: Patient descibe pain 6/10 on R lateral femur. Patient agreeable to therapy.          Orientation  Orientation  Overall Orientation Status: Within Functional Limits  Cognition      Objective   Bed mobility  Bridging: Stand by assistance  Rolling to Left: Stand by assistance  Supine to Sit: Stand by assistance  Sit to Supine: Stand by assistance  Scooting: Stand by assistance  Transfers  Sit to Stand: Stand by assistance(cues for hand placement)  Stand to sit: Stand by assistance(cues for hand placement)  Bed to Chair: Stand by assistance  Ambulation  Ambulation?: Yes  Ambulation 1  Surface: level tile  Device: Platform Walker right  Assistance: Contact guard assistance;Stand by assistance  Quality of Gait: Patient with antalgic gait, decreased WB R LE, step to gait pattern with slow cadence  Distance: 150', short distances in gym  Comments: 8 min to complete longer distances  Stairs/Curb  Stairs?: Yes  Stairs  # Steps : 1  Curbs: 6"  Device: Rolling walker(R platform wheeled walker)  Assistance: Minimal assistance  Comment: assist for wheeled walker with platform        Exercises  Quad Sets: 12  Heelslides: 12  Gluteal Sets: 12  Hip Abduction: adductor sets x 12, abduction x 10 with assist  Knee Long Arc Quad: 12  Knee Short Arc Quad: 15  Ankle Pumps: 20  Comments: encouraged to perform deep breathing exercises  Other exercises  Other exercises?: No        Assessment   Body structures, Functions, Activity limitations: Decreased functional mobility ;Decreased strength;Decreased ADL status;Decreased balance;Decreased endurance;Decreased ROM;Increased Pain  Assessment: Patient is a 72 y/o female with S/P gamma nail of R femur and ORIF R radius with c-arm on 11/22/17. Patient s/p fall on stairs and was independent with  no device prior to fall. Patient has already met 3/4 STG, apppears pain control improved as well as confidence. Patient transfer with SBA with cues for hand placement, ambulate  with R platform wheeled walker 150' with SBA/CGA( took 8 minutes to complete), curb step with AD with min/CGA and SBA with bed mobility with pain but able to complete. . Patient limited by pain , weakness and decreased endurance. patient will benefit from ARU to improve overall independence with mobility.Patient would like D/C by Saturday, 4/27, Plan to discuss with OT.   Prognosis: Good  Patient Education: safety with transfers and gait, curb, importance of exercises  REQUIRES PT FOLLOW UP: Yes  Activity Tolerance  Activity Tolerance: Patient Tolerated treatment well;Patient limited by pain       Goals  Short term goals  Time Frame for Short term goals: 7 days  Short term goal 1: bed mobility at SBA- met 4/23  Short term goal 2: transfers at stand by assist to platform walker- met 4/23  Short term goal 3: ambulation with R platform wheeled walker 150' with SBA  Short term goal 4: ascend/descend curb step with min/CGA- met 4/23  Long term goals  Time Frame for Long term goals : 10 days  Long term goal 1: bed mobility at MI  Long term goal 2: transfers at MI with platform walker  Long term goal 3: ambulation with R platform wheeled walker 150' with MI  Long term goal 4: ascend/descend curb step with CGA  Long term goal 5: ascend /descend 4 steps with CGA and assist of another  Patient Goals   Patient goals : to go home and be able to help huband with cooking ect    Plan    Plan  Times per week: 5-6x   Times per day: Twice a day  Plan weeks: 7-10 days  Current Treatment Recommendations: Strengthening, ROM, Location manager, Building services engineer, Teacher, early years/pre, Archivist, Teaching laboratory technician, Stair training, Pain Management, Home Exercise Program, Equities trader, Glass blower/designer, Investment banker, operational, Patent attorney, Mining engineer, Education, & procurement  Safety Devices  Type of devices: All fall risk precautions in place, Gait belt(transporter to take patient  back to room)  Restraints  Initially in place: No     Therapy Time   Individual Concurrent Group Co-treatment   Time In 0900         Time Out 0945         Minutes 45         Timed Code Treatment Minutes: 45 Minutes       Donneta Romberg, PT # [phone]

## 2017-11-26 NOTE — Progress Notes (Signed)
PHYSICAL THERAPY  Progress Note   Second Session    Patient Name: Linda [name]  Medical Record Number: [id number]           Chart Reviewed: Yes   Restrictions/Precautions: Fall Risk Other position/activity restrictions: RLE WBAT, NWBing RUE   Additional Pertinent Hx: fall on stairs resulted in an intertrochanteric femur fx R and distal radial fx. patient had surgery 4/19 with s/p gamma nail and ORIF R radius with C- arm. Patient is WBAT R LE. PMH -Grave's disease, HTN, migraines, S/P LB surgery, L humeral fx with surgery        Subjective  Patient descibe pain 4/10 on R lateral femur - "as good as it has felt." Feels that pain is well-controlled with changes Dr. Wyatt Haste made to medications.  Patient agreeable to therapy.    Objective  Discussed need to take pain medication regularly to control the pain - last took pain medications about 5 hours prior to therapy, but able to wait for more until she returns to her room after this therapy session.     Sit to stand with SBA/CGA, took several rocking attempts as patient did not scoot to front edge of chair prior.  Ambulated 150' with wheeled walker with right platform with SBA on level surfaces with threshold to exit/enter therapy department - took about 4-5 minutes.     Sit to/from supine with pillows to patient's left side with SBA, struggled and needed multiple attempts with supine/left side to sit at edge of bed.  Supine exercises for bilateral LEs: ankle pumps, glut sets, quad sets, hip add sets, hip abd with CGA/min A initially with R LE but eventually with SBA with decreasing amplitude with fatigue, heel slides, TKEs with slight cogwheel movement with concentric and eccentric contractions on B LEs but R>L x 15 reps each LE and SLRs with min A for RLE  x 15 reps. Occasional verbal and tactile cues for correct techniques throughout. Seated LAQs x 15 reps each LE.     Transferred mat to wheelchair with wheeled walker and SBA.       Assessment: Patient is a 72 y/o  female with S/P gamma nail of R femur and ORIF R radius with c-arm on 11/22/17. Patient s/p fall on stairs and was independent with no device prior to fall. Patient has already met 3/4 STG, apppears pain control improved as well as confidence. Patient transfer with SBA with cues for hand placement, ambulate with R platform wheeled walker 150' with SBA/CGA( took 8 minutes to complete), curb step with AD with min/CGA and SBA with bed mobility with pain but able to complete. . Patient limited by pain , weakness and decreased endurance. patient will benefit from ARU to improve overall independence with mobility.Patient would like D/C by Saturday, 4/27, Plan to discuss with OT.       Safety Device - Type of devices:  '[x]'$   All fall risk precautions in place '[]'$  Bed alarm in place  '[]'$  Call light within reach '[]'$  Chair alarm in place '[]'$  Positioning belt '[x]'$  Gait belt '[]'$  Patient at risk for falls '[]'$  Left in bed '[]'$  Left in chair '[]'$  Telesitter in use '[]'$  Sitter present '[]'$  Nurse notified '[]'$   None      Therapy Time   Individual Co-treatment   Time In 1345    Time Out 1430    Minutes 45        Electronically signed by Mancel Bale, PT [phone] on 11/26/2017 at  2:07 PM

## 2017-11-26 NOTE — Progress Notes (Signed)
Department of Physical Medicine & Rehabilitation  Dr. Wyatt Haste Progress Note    Patient Identification:  Linda [name]  [id number]  DOB: Oct 25, 1945  Admit date: 11/24/2017      Diagnosis:   Patient Active Problem List   Diagnosis   ??? Hyperlipidemia   ??? Senile osteoporosis   ??? Menopausal and postmenopausal disorder   ??? GERD (gastroesophageal reflux disease)   ??? Migraine headache   ??? Colon polyps   ??? Depression   ??? Intractable headache   ??? Hypoxemia   ??? Osteoporosis   ??? Intractable chronic migraine without aura   ??? Medication overuse headache   ??? Iron deficiency anemia secondary to blood loss (chronic)   ??? Other intestinal malabsorption   ??? Graves disease   ??? Closed intertrochanteric fracture of hip, right, initial encounter ([facility])   ??? Closed fracture of right wrist   ??? Closed fracture of right distal radius   ??? Closed fracture of intertrochanteric section of femur, right, sequela           Subjective:  Patient seen this morning. She was able sleep well last night, but still is feeling pain constantly in the right hip area. We'll increase her pain medication again. She is working in therapies on her transfers and lower body ADLs. Labs yesterday reveal low potassium level, now she is on potassium replacement therapy. Patient will be discussed in rehabilitation conference tomorrow.    BP (!) 117/59    Pulse 60    Temp 98.2 ??F (36.8 ??C) (Oral)    Resp 16    Ht 5\' 4"  (1.626 m)    Wt 154 lb 5.2 oz (70 kg)    SpO2 94%    BMI 26.49 kg/m??     Last 24 hour lab  No results found for this or any previous visit (from the past 24 hour(s)).    Therapy progress:  PT  Position Activity Restriction  Other position/activity restrictions: RLE WBAT, NWBing RUE  Objective     Sit to Stand: Minimal Assistance, Contact guard assistance(with R platform wheeled walker)  Stand to sit: Contact guard assistance(cues for hand placement and to extend R knee)  Device: Platform Walker right  Assistance: Contact guard assistance  Distance: 15',  30'  OT  PT Equipment Recommendations  Equipment Needed: Yes  Mobility Devices: Lawyer: Platform Right, Financial planner - Technique: Stand step  Equipment Used: Grab bars                  ??  Assessment and Plan:  ??  right intertrochanteric hip fracture- repaired 4/19 with IM nailing by Dr. Bridgett Larsson, weightbearing as tolerated right leg  ??  Right wrist fracture- repaired 4/19 with ORIF by Dr. Barnet Pall, no weightbearing right wrist.   ??  Depression -Paxil  ??  GERD-protonix  ??  Graves disease  ??  osteoporosis ??- [place]  ??  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   ??  Bowels: Schedule Miralax + Senna S. Follow bowel movements. Enema or suppository if needed.   ??  Bladder: Check PVR x 3.  [place] if PVR > 266ml or if any volume is > 500 ml.   ??  Pain: Percocet is ordered prn.   ??    Barth Kirks, MD, 11/26/2017, 8:34 AM

## 2017-11-26 NOTE — Plan of Care (Signed)
Problem: Risk for Impaired Skin Integrity  Goal: Tissue integrity - skin and mucous membranes  Description  Structural intactness and normal physiological function of skin and  mucous membranes.  Outcome: Ongoing     Problem: IP BLADDER/VOIDING  Goal: LTG - patient will complete bladder elimination  Outcome: Ongoing  Goal: LTG - patient will achieve acceptable level of continence  Outcome: Ongoing  Goal: STG - patient will be able to empty bladder  Outcome: Ongoing     Problem: SAFETY  Goal: LTG - patient will adhere to hip precautions during ADL's and transfers  Outcome: Ongoing  Goal: LTG - Patient will demonstrate safety requirements appropriate to situation/environment  Outcome: Ongoing  Goal: LTG - patient will utilize safety techniques  Outcome: Ongoing     Problem: SKIN INTEGRITY  Goal: LTG - Patient will be free from infection  Outcome: Ongoing     Problem: PAIN  Goal: LTG - Patient will manage pain with the appropriate technique/Intervention  Outcome: Ongoing  Goal: LTG - Patient will demonstrate intervention for managing pain  Outcome: Ongoing  Goal: STG - Patient will reduce or eliminate use of analgesics  Outcome: Ongoing  Goal: STG - pain is manageable through therapies  Outcome: Ongoing     Problem: Discharge Planning:  Goal: Patients continuum of care needs are met  Description  Patients continuum of care needs are met  Outcome: Ongoing     Problem: Infection - Surgical Site:  Goal: Will show no infection signs and symptoms  Description  Will show no infection signs and symptoms  Outcome: Ongoing     Problem: Mobility - Impaired:  Goal: Mobility will improve to maximum level  Description  Mobility will improve to maximum level  Outcome: Ongoing

## 2017-11-26 NOTE — Plan of Care (Signed)
Problem: Risk for Impaired Skin Integrity  Goal: Tissue integrity - skin and mucous membranes  Description  Structural intactness and normal physiological function of skin and  mucous membranes.  11/26/2017 1102 by Milderd Meager, RN  Note:   Pt is at risk for impaired skin integrity. Assess skin every shift and prn. Turn every 2 hours. Keep heels off bed. Keep skin clean and dry. Surgical incision to right hip.  11/26/2017 0124 by Enis Gash, RN  Outcome: Ongoing     Problem: IP BLADDER/VOIDING  Goal: LTG - patient will complete bladder elimination  11/26/2017 1102 by Milderd Meager, RN  Note:   Pt voiding without difficulty.  11/26/2017 0124 by Enis Gash, RN  Outcome: Ongoing  Goal: LTG - patient will achieve acceptable level of continence  11/26/2017 0124 by Enis Gash, RN  Outcome: Ongoing  Goal: STG - patient will be able to empty bladder  11/26/2017 0124 by Enis Gash, RN  Outcome: Ongoing     Problem: SAFETY  Goal: LTG - patient will adhere to hip precautions during ADL's and transfers  11/26/2017 0124 by Enis Gash, RN  Outcome: Ongoing  Goal: LTG - Patient will demonstrate safety requirements appropriate to situation/environment  11/26/2017 0124 by Enis Gash, RN  Outcome: Ongoing  Goal: LTG - patient will utilize safety techniques  11/26/2017 0124 by Enis Gash, RN  Outcome: Ongoing     Problem: SKIN INTEGRITY  Goal: LTG - Patient will be free from infection  11/26/2017 0124 by Enis Gash, RN  Outcome: Ongoing     Problem: PAIN  Goal: LTG - Patient will manage pain with the appropriate technique/Intervention  11/26/2017 1102 by Milderd Meager, RN  Note:   Assess pts level of pain. Medicate as ordered for pain. Evaluate effectiveness of pain med given. Notify physician if pain not controlled. Alternative pain reliever   11/26/2017 0124 by Enis Gash, RN  Outcome: Ongoing  Goal: LTG - Patient will demonstrate intervention for managing pain  11/26/2017 0124 by Enis Gash, RN  Outcome:  Ongoing  Goal: STG - Patient will reduce or eliminate use of analgesics  11/26/2017 0124 by Enis Gash, RN  Outcome: Ongoing  Goal: STG - pain is manageable through therapies  11/26/2017 0124 by Enis Gash, RN  Outcome: Ongoing     Problem: Discharge Planning:  Goal: Patients continuum of care needs are met  Description  Patients continuum of care needs are met  11/26/2017 0124 by Enis Gash, RN  Outcome: Ongoing     Problem: Infection - Surgical Site:  Goal: Will show no infection signs and symptoms  Description  Will show no infection signs and symptoms  11/26/2017 0124 by Enis Gash, RN  Outcome: Ongoing     Problem: Mobility - Impaired:  Goal: Mobility will improve to maximum level  Description  Mobility will improve to maximum level  11/26/2017 0124 by Enis Gash, RN  Outcome: Ongoing

## 2017-11-26 NOTE — Progress Notes (Addendum)
Occupational Therapy  Facility/Department: [facility]  Daily Treatment Note  NAME: Linda [name]  DOB: 09/06/1945  MRN: [id number]    Date of Service: 11/26/2017    Discharge Recommendations:  Home with assist PRN  OT Equipment Recommendations  ADL Assistive Devices: Transfer Tub Bench;Long-handled Sponge;Sock-Aid Hard;Reacher;Hand-held Shower;Toilet Safety Frame;Long-handled Fish farm manager deficits / Impairments: Decreased functional mobility ;Decreased strength;Decreased endurance;Decreased ADL status;Decreased balance;Decreased high-level IADLs  Assessment: Patient is progressing in her tolerance of pain and ability to ambulate with R platform walker.  She maintains her NWB on RUE well and is tolerating WBing on RLE despite complaints of pain up to "7/10".  She lives with her husband who is able to support her at home and provide driving, complete laundry and cooking.  Patient required mod to max A with LB bathing and dressing on initial eval.  Will cont OT POC until d/c with her husband assist by the end of the week likely.  Treatment Diagnosis: impaired func mob, transfers, and ADL status   Patient Education: DME for home  REQUIRES OT FOLLOW UP: Yes  Activity Tolerance  Activity Tolerance: Patient Tolerated treatment well  Safety Devices  Safety Devices in place: Yes  Type of devices: Call light within reach;Left in chair;Chair alarm in place;Gait belt       PM Session:  Seen in dept after pick up in room.  Stated pain improved to "4/10" in her leg only, no pain in RUE.  Min A for transfer to w/c.  Patient stated she felt very fatigued but agreed to participate.  Taken out to [employer] and stood with CGA at railing, no LOB.  Completed 5# free weights with LUE x 2 x 10 reps.  Introduced hip kit DME and able to don Left sock with sock aid with min cues/directions, reacher and long sponge provided. Sent with PT for session after OT.    Patient Diagnosis(es): There were no encounter  diagnoses.      has a past medical history of Bisphosphonate-associated osteonecrosis of the jaw [facility] Celiac artery atherosclerosis, Colon polyps, Dyspepsia, Graves disease, High cholesterol, History of depression, Hypertension, Iron deficiency anemia secondary to blood loss (chronic), Mass of parotid gland, Migraine, Osteoarthritis of lumbar spine, Other intestinal malabsorption, Postmenopausal, and Thoracic compression fracture (HCC).   has a past surgical history that includes shoulder surgery; Artery Biopsy (Right, 12-24-12); Upper gastrointestinal endoscopy (08/26/2014); Colonoscopy ([id number]); Colonoscopy (N/A, 07/09/2017); Colonoscopy (N/A, 07/09/2017); Upper gastrointestinal endoscopy (N/A, 08/08/2017); esophageal motility study (N/A, 08/27/2017); Fixation Kyphoplasty; Wrist fracture surgery (Right, 11/22/2017); hip surgery (Right, 11/22/2017); Hip fracture surgery (Right, 11/22/2017); and ORIF DISTAL RADIUS FRACTURE (Right, 11/22/2017).    Restrictions  Restrictions/Precautions  Restrictions/Precautions: Fall Risk  Position Activity Restriction  Other position/activity restrictions: RLE WBAT, NWBing RUE  Subjective   General  Chart Reviewed: Yes  Patient assessed for rehabilitation services?: Yes  Additional Pertinent Hx: Pt is 72 y.o. F who presents s/p mechanical fall down steps at home resulting in R intertrochanteric femur fracture and R distal radius fracture. Pt is WBAT on RLE and NWBing on RUE. PMH: Iron deficiency anemia, thoracic compression fx, depression  Family / Caregiver Present: No  Referring Practitioner: Dr. Robby Sermon  Diagnosis: nondisplaced intertrochanteric fx of R femur  Subjective  Subjective: Seen in dept, agreed to OT, no complaints  Pain Assessment  Pain Assessment: 0-10  Pain Level: 5  Pain Type: Surgical pain  Vital Signs  Patient Currently in Pain: Yes  Objective             Balance  Sitting Balance: Stand by assistance  Standing Balance: Contact guard assistance  Toilet  Transfers  Toilet - Technique: Ambulating  Equipment Used: Grab bars(toilet safety frame)  Toilet Transfer: Public affairs consultant - Transfer From: [employer] RUN)  Shower - Transfer Type: To and From  Shower - Transfer To: Advertising account planner - Technique: Merchandiser, retail Transfers: Minimal assistance(Min A with RLE over side of tub)     Transfers  Stand Step Transfers: Contact guard assistance  Sit to stand: Contact guard assistance  Stand to sit: Contact guard assistance  Transfer Comments: to/from platform walker; min cues for hand placement (mostly to not leave RUE on platform when sitting)               Plan   Plan  Times per week: 5-6x  Times per day: Twice a day  Plan weeks: 1 week  Current Treatment Recommendations: Strengthening, Building services engineer, Teaching laboratory technician, Location manager, Patent attorney, Mining engineer, Education, Sports administrator, Equities trader, Engineer, petroleum / ADL, Home Management Training       Goals  Short term goals  Time Frame for Short term goals: 4 days  Short term goal 1: Pt will complete functional mobility and transfers with SBA and use of AD  Short term goal 2: Pt will complete grooming in stance at sink with SBA  Short term goal 3: Pt will complete dressing and bathing SBA and use of AD as needed  Short term goal 4: Pt will complete kitchen mobility with SBA  Short term goal 5: Pt will complete toileting with SBA  Long term goals  Time Frame for Long term goals : 7 days  Long term goal 1: Pt will complete functional mobility and transfers with Mod Ind   Long term goal 2: Pt will complete grooming in stance at sink Mod Ind  Long term goal 3: Pt will complete dressing and bathing Mod Ind   Long term goal 4: Pt will complete toileting Mod Ind  Long term goal 5: Pt will complete simple meal prep Mod Ind  Patient Goals   Patient goals : to return home       Therapy Time   Individual Concurrent Group  Co-treatment   Time In 0950         Time Out 1035         Minutes 45         Timed Code Treatment Minutes: 45 Minutes    Therapy Time     Individual Co-treatment   Time In 1300    Time Out 1345    Minutes 45          [place], [place]

## 2017-11-26 NOTE — Other (Signed)
[facility]  Inpatient Rehabilitation  Weekly Team Conference Note    Date: 11/27/2017  Patient Name: Linda [name]        MRN: [id number]    DOB: 02/19/1946  (72 y.o.)  Gender: female   Diagnosis: R Inter trochanteric femur fx, distal radial fx, s/p gamma nail , ORIF R radius with C arm    CASE MANAGEMENT  Assessment:  Goal is home.      PHYSICAL THERAPY      Bed mobility  Bridging: Stand by assistance  Rolling to Left: Stand by assistance  Rolling to Right: Unable to assess(too much pain)  Supine to Sit: Stand by assistance  Sit to Supine: Stand by assistance  Scooting: Stand by assistance  Comment: increased time to complete    Transfers:  Sit to Stand: Stand by assistance(cues for hand placement)  Stand to sit: Stand by assistance(cues for hand placement)  Bed to Chair: Stand by assistance  Comment: car transfers refused due to pain    Ambulation 1  Surface: level tile  Device: Platform Walker right  Assistance: Contact guard assistance, Stand by assistance  Quality of Gait: Patient with antalgic gait, decreased WB R LE, step to gait pattern with slow cadence  Distance: 150', short distances in gym  Comments: 8 min to complete longer distances    Stairs  # Steps : 1  Curbs: 6"  Device: Rolling walker(R platform wheeled walker)  Assistance: Minimal assistance  Comment: assist for wheeled walker with platform           FIMS:  Bed, Chair, Wheel Chair: 5 - Requires setup/supervision/cues  Walk: 4 - Contact Guard/Minimal Assistance Requires up to Contact Guard or Minimal Assistance to walk at least 150 feet  Distance Walked: 150  Stairs: 1- Total Assistance perfoms less than 25% of the effort, or requirs the assistance of two people, or goes up and down fewer than 4 stairs    PT Equipment Recommendations  Equipment Needed: Yes  Mobility Devices: Lawyer: Platform Right, Rolling    Assessment: Patient is a 72 y/o female with S/P gamma nail of R femur and ORIF R radius with c-arm on 11/22/17.  Patient s/p fall on stairs and was independent with no device prior to fall. Patient has already met 3/4 STG, apppears pain control improved as well as confidence. Patient transfer with SBA with cues for hand placement, ambulate with R platform wheeled walker 150' with SBA/CGA( took 8 minutes to complete), curb step with AD with min/CGA and SBA with bed mobility with pain but able to complete. . Patient limited by pain , weakness and decreased endurance. patient will benefit from ARU to improve overall independence with mobility.Patient would like D/C by Saturday, 4/27, Plan to discuss with OT.       SPEECH THERAPY (intentionally left blank if not actively being seen by this service):      OCCUPATIONAL THERAPY    ADL  Grooming: Setup(seated in w/c at sink)  UE Bathing: Stand by assistance(seated)  LE Bathing: Moderate assistance(assist for R foot and LE; CGA in stance for backside)  UE Dressing: Stand by assistance(seated)  LE Dressing: Maximum assistance(assist to thread bilateral LE; CGA in stance for pulling over hips)  Toileting: Contact guard assistance(CGA for balance in stance; pt able to manage briefs)    Bed mobility  Bridging: Stand by assistance  Rolling to Left: Stand by assistance  Rolling to Right: Unable to assess(too much pain)  Supine to Sit: Stand by assistance  Sit to Supine: Stand by assistance  Scooting: Stand by assistance  Comment: increased time to complete    Functional Transfers:  Ecologist - Technique: Ambulating  Equipment Used: Nurse, learning disability frame)  Financial trader - Transfer From: [employer] RUN)  Shower - Transfer Type: To and From  Shower - Transfer To: Education officer, museum - Technique: Programmer, systems Transfers: Minimal assistance(Min A with RLE over side of tub)       FIMS:  Eating: 5 - Feeds self with setup/supervision/cues and/or requires only setup/supervision/cues to perform tube  feedings  Grooming: 5 - Requires setup/cues to do all tasks  Bathing: 3 - Able to bathe 5-7 areas  Dressing-Upper: 5 - Requires setup/supervision/cues and/or requires assist with presthesis/brace only  Dressing-Lower: 2 - Requires assist with 4-5 parts of dressing  Toileting: 2 - Able to perform 1 task only (e.g. hygiene)  Toilet Transfer: 2 - Requires 50-74% assist getting off toilet  Shower Transfer: 2 - Maximal assistance, pt. expends 25%-49% effort         Assessment: Patient is progressing in her tolerance of pain and ability to ambulate with R platform walker.  She maintains her NWB on RUE well and is tolerating WBing on RLE despite complaints of pain up to "7/10".  She lives with her husband who is able to support her at home and provide driving, complete laundry and cooking.  Patient required mod to max A with LB bathing and dressing on initial eval.  Will cont OT POC until d/c with her husband assist by Sat, recommend home OT. DME: likely to need TTB, RW, toilet safety frame        NUTRITION  Weight: 154 lb 5.2 oz (70 kg) / Body mass index is 26.49 kg/m??.  Diet Order: DIET GENERAL;  Dietary Nutrition Supplements: Standard High Calorie Oral Supplement  PO Meals Eaten (%): 1 - 25%    NURSING  Bladder 6, bowel 6, monitor and maintain skin integrity, monitor incisions  Family Education: Patient education: hip safety, non weight bearing to right upper extremity, incision care, care of splint/ace, skin care/prevention, medications, pain control, safety/fall prevention.      MEDICAL  Repeat labs today reveal normal potassium level at this time.  ??    TEAM SUMMARY AND DISCHARGE PLAN  Estimated Length of Stay: DC 11/30/17  Destination: home with home care   ?? Anticipated Services at Discharge:    '[x]'  OT  '[x]'  PT   '[]'  SLP    '[x]'  RN   '[]'  Home Health aide '[]'  SW  Community Resources: _______________________________  Equipment recommendations:  '[]'  Hospital bed '[x]'  Tub bench  '[]'  Shower chair '[]'  Hand held shower  '[]'  Raised  toilet seat '[x]'  Toilet safety frame '[]'  Bedside commode   '[]'  W/C: _____  '[x]'  Rolling Walker '[]'  Standard walker '[]'  Gait belt '[]'  cane: _________  '[]'  Sliding board '[]'  Alternate seating/furniture '[]'  O2 '[]'  Hip Kit: _______  '[]'  Life Line '[]'  Other: ______  Factors facilitating achievement of predicted outcomes:motivated, cooperative   Barriers to the achievement of predicted outcomes/Interventions:   NWB right wrist- compensatory strategies and adaptive equipment for gait, transfers, self care       Interdisciplinary Individualized Plan of Care Review:    ?? Continue Current Plan of Care: Yes    ?? Modifications:_____________________________    Special Needs in  the Upcoming Week :    '[]'  Family/Caregiver Education  '[]'  Home visit  '[]' Therapeutic Pass   '[]'  Consults:_______    '[]'  Other;_______    Patient Rehab Team Goals for the Upcoming Week:  1.  Modified independent with gait, transfers, self care   2.  Patient goals : to go home and be able to help huband with cooking ect      Team Members Present at Conference:  Physician:  Dr. Wyatt Haste   Case Manager: Mardella Layman, LSW   Occupational Therapist: Lindajo Royal, OTR/L  Physical Therapist: Kathrin Ruddy, [place]  Speech Therapist:   Nurse: Tilden Fossa RN, CRRN  Dietician:  Havery Moros, RD, LD   Program Director Manya Silvas, MPT      I led this team conference and I approve the established interdisciplinary plan of care as documented within the medical record of Marlaya A Holstad.    MD: Dr. Wyatt Haste

## 2017-11-26 NOTE — Care Coordination-Inpatient (Signed)
LSW reviewed chart.  LSW met with patient to introduce social services role, initiate discussion regarding DC planning and to inform of weekly review on progress during Team Conference.  She prefers to be called Linda [name].                               SOCIAL WORK ASSESSMENT      GOAL:   To return home with spouse.      HOME SITUATION:  Patient and spouse live in a house with 2 steps entry.   It is a one floor set up.   She and spouse are retired.   They are planning an move to [place] in the near future and pt is boxing items up when she took a fall.     She was totally independent prior to admit.  She is active with Dr Garen Grams for PCP ;needs.    She was independent for all household chores.  She is an active driver.        PRIOR LEVEL OF FUNCTIONING:       PERSONAL CARE:    independent                                                                       DRIVES:  yes                                                                     FINANCES:   independent                                                                   MEALS:  independent            GROCERY SHOPS:  independnent      DME CURRENTLY AT HOME:    crutches    CURRENT HOME CARE/SERVICES:  LSW informed her of possible post acute services such as home care or outpatient services.  She is agreeable to what MD  Suggests.      PREFERRED HOME CARE:   To be determined.   LSW provided a list of home care agencies.      TEAM CONFERENCE DAY:  Wednesdays.  LSW provided information regarding weekly review of her progress during Team Conference.  Team will review progress, DME recommendations and DC  Date.    LSW informed patient of after visit phone calls from [place] on days 2, 7, and 60 for assessment of needs.    LSW informed patient of recommendation for PCP visit within 7 days post discharge.    LSW informed patient of preferred pick up time on date of discharge which is between 10 - 12 noon.    [place], [place]  Case Management   [phone]    11/26/2017  4:23  PM

## 2017-11-27 LAB — POTASSIUM: Potassium: 4.4 mmol/L (ref 3.5–5.1)

## 2017-11-27 MED ORDER — POTASSIUM CHLORIDE CRYS ER 20 MEQ PO TBCR
20 MEQ | Freq: Every day | ORAL | Status: DC
Start: 2017-11-27 — End: 2017-11-30
  Administered 2017-11-28 – 2017-11-30 (×3): 20 meq via ORAL

## 2017-11-27 MED ORDER — BISACODYL 5 MG PO TBEC
5 MG | Freq: Once | ORAL | Status: AC
Start: 2017-11-27 — End: 2017-11-27
  Administered 2017-11-27: 14:00:00 5 mg via ORAL

## 2017-11-27 MED FILL — PEG 3350 17 G PO PACK: 17 g | ORAL | Qty: 1

## 2017-11-27 MED FILL — ACETAMINOPHEN 325 MG PO TABS: 325 mg | ORAL | Qty: 2

## 2017-11-27 MED FILL — DIVALPROEX SODIUM ER 500 MG PO TB24: 500 mg | ORAL | Qty: 1

## 2017-11-27 MED FILL — CALCIUM-VITAMIN D 500-200 MG-UNIT PO TABS: 500-200 MG-UNIT | ORAL | Qty: 1

## 2017-11-27 MED FILL — BISACODYL EC 5 MG PO TBEC: 5 mg | ORAL | Qty: 1

## 2017-11-27 MED FILL — OXYCODONE-ACETAMINOPHEN 5-325 MG PO TABS: 5-325 mg | ORAL | Qty: 2

## 2017-11-27 MED FILL — OXYCONTIN 10 MG PO T12A: 10 mg | ORAL | Qty: 2

## 2017-11-27 MED FILL — PANTOPRAZOLE SODIUM 40 MG PO TBEC: 40 mg | ORAL | Qty: 1

## 2017-11-27 MED FILL — OXYCODONE-ACETAMINOPHEN 5-325 MG PO TABS: 5-325 mg | ORAL | Qty: 1

## 2017-11-27 MED FILL — DOK PLUS 50-8.6 MG PO TABS: 50-8.6 mg | ORAL | Qty: 2

## 2017-11-27 MED FILL — PAROXETINE HCL 20 MG PO TABS: 20 mg | ORAL | Qty: 1

## 2017-11-27 MED FILL — FERREX 150 150 MG PO CAPS: 150 mg | ORAL | Qty: 1

## 2017-11-27 MED FILL — TRAZODONE HCL 50 MG PO TABS: 50 mg | ORAL | Qty: 1

## 2017-11-27 MED FILL — LOVENOX 40 MG/0.4ML SC SOLN: 40 MG/0.4ML | SUBCUTANEOUS | Qty: 0.4

## 2017-11-27 NOTE — Care Coordination-Inpatient (Signed)
SOCIAL WORK DISCHARGE SUMMARY:      DISCHARGE DATE:                 Saturday, 11-30-2017      DISCHARGE PLACE:                Home                HOME CARE AGENCY:            [facility]             PHONE NUMBER          [phone]             FAX NUMBER                 [phone]      TRANSPORTATION:                spouse             TIME:                                 10-12 noon      PREFERRED PHARMACY:     [facility]             NUMBER:                            MD orders:   SN/PT/OT    DME:    Right Platform wh walker    [place], [place]     Case Management   [phone]    11/27/2017  3:59 PM

## 2017-11-27 NOTE — Discharge Instructions (Addendum)
Continuity of Care Form    Patient Name: Linda [name]   DOB:  06-16-1946  MRN:  [id number]    Admit date:  11/24/2017  Discharge date:  11-30-2017    Code Status Order: Full Code   Advance Directives:     Admitting Physician:  Sheryle Spray, MD  PCP: Angelica Pou, MD    Discharging Nurse: [facility] Unit/Room#: K3N-3259/3259-01  Discharging Unit Phone Number: ***    Emergency Contact:   Extended Emergency Contact Information  Primary Emergency Contact: Kenni, Newton  Address: [address]           [place] [address] [place] Phone: [phone]  Mobile Phone: [phone]  Relation: Spouse  Secondary Emergency Contact: Huth,Paulette   [place] Phone: [phone]  Relation: Other    Past Surgical History:  Past Surgical History:   Procedure Laterality Date   ??? ARTERY BIOPSY Right 12-24-12    Temporal Artery biopsy right    ??? COLONOSCOPY  [id number]    dr Awanda Mink   3 years   ??? COLONOSCOPY N/A 07/09/2017    COLONOSCOPY POLYPECTOMY SNARE performed by Orvan Seen, MD at [place]   ??? COLONOSCOPY N/A 07/09/2017    COLONOSCOPY WITH BIOPSY performed by Orvan Seen, MD at [place]   ??? ESOPHAGEAL MOTILITY STUDY N/A 08/27/2017    ESOPHAGEAL MANOMETRY performed by Orvan Seen, MD at [place]   ??? FIXATION KYPHOPLASTY      t10   ??? HIP FRACTURE SURGERY Right 11/22/2017    RIGHT HIP GAMMA NAILING performed by Arville Lime, MD at [place]   ??? HIP SURGERY Right 11/22/2017    right hip gamma nailing   ??? ORIF DISTAL RADIUS FRACTURE Right 11/22/2017    OPEN REDUCTION INTERNAL FIXATION RIGHT RADIUS WITH C-ARM performed by Vevelyn Francois, MD at [place]   ??? SHOULDER SURGERY      left humerus fx 3 places   ??? UPPER GASTROINTESTINAL ENDOSCOPY  08/26/2014    dr Shanon Brow hess:negative   ??? UPPER GASTROINTESTINAL ENDOSCOPY N/A 08/08/2017    EGD ESOPHAGOGASTRODUODENOSCOPY performed by Orvan Seen, MD at [place]   ??? WRIST FRACTURE SURGERY Right  11/22/2017       Immunization History:   Immunization History   Administered Date(s) Administered   ??? Influenza Virus Vaccine 05/12/2012   ??? Influenza, High Dose (Fluzone 65 yrs and older) 07/11/2016, 06/19/2017   ??? Pneumococcal 13-valent Conjugate (Prevnar13) 09/07/2014   ??? Pneumococcal Polysaccharide (Pneumovax23) 12/25/2012   ??? Tdap (Boostrix, Adacel) 03/31/2012       Active Problems:  Patient Active Problem List   Diagnosis Code   ??? Hyperlipidemia E78.5   ??? 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 Menopausal and postmenopausal disorder N95.9   ??? GERD (gastroesophageal reflux disease) K21.9   ??? Migraine headache G43.909   ??? Colon polyps K63.5   ??? Depression F32.9   ??? Intractable headache R51   ??? Hypoxemia R09.02   ??? Osteoporosis M81.0   ??? Intractable chronic migraine without aura G43.719   ??? Medication overuse headache G44.40   ??? 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 Graves disease E05.00   ??? Closed intertrochanteric fracture of hip, right, initial encounter ([place]) S72.141A   ??? Closed fracture of right wrist S62.101A   ??? Closed  fracture of right distal radius S52.501A   ??? Closed fracture of intertrochanteric section of femur, right, sequela S72.141S       Isolation/Infection:   Isolation          No Isolation            Nurse Assessment:  Last Vital Signs: BP 111/70    Pulse 64    Temp 98.1 ??F (36.7 ??C) (Oral)    Resp 16    Ht 5\' 4"  (1.626 m)    Wt 154 lb 12.2 oz (70.2 kg)    SpO2 97%    BMI 26.57 kg/m??     Last documented pain score (0-10 scale): Pain Level: 3  Last Weight: 70.2kgs  Wt Readings from Last 1 Encounters:   11/27/17 154 lb 12.2 oz (70.2 kg)     Mental Status:  oriented, alert, coherent, logical, thought processes intact and able to concentrate and follow conversation    IV Access:  - None    Nursing Mobility/ADLs:  Walking   Independent Modified  Transfer  Independent Modified  Bathing  Independent Modified  Dressing  Independent  Modified  Toileting  Independent Modified  Feeding  [place] Delivery   whole    Wound Care Documentation and Therapy:        Elimination:  Continence:   ?? Bowel: Yes  ?? Bladder: Yes  Urinary Catheter: None   Colostomy/Ileostomy/Ileal Conduit: No       Date of Last BM: 11/28/17      Intake/Output Summary (Last 24 hours) at 11/27/2017 1557  Last data filed at 11/27/2017 1007  Gross per 24 hour   Intake 270 ml   Output --   Net 270 ml     I/O last 3 completed shifts:  In: 270 [P.O.:270]  Out: -     Safety Concerns:     None    Impairments/Disabilities:      None    Nutrition Therapy:  Current Nutrition Therapy:   - Oral Diet:  General    Routes of Feeding: Oral  Liquids: Thin Liquids  Daily Fluid Restriction: no  Last Modified Barium Swallow with Video (Video Swallowing Test): not done    Treatments at the Time of Hospital Discharge:   Respiratory Treatments: none  Oxygen Therapy:  is not on home oxygen therapy.  Ventilator:    - No ventilator support    Rehab Therapies: Physical Therapy and Orthotics/Prosthetics, Visiting Nurse  Weight Bearing Status/Restrictions: NWB RUE  Other Medical Equipment (for information only, NOT a DME order):  walker  Other Treatments:     Patient's personal belongings (please select all that are sent with patient):  Dentures Upper, Glasses    RN SIGNATURE:  {Esignature:[id number]}    CASE MANAGEMENT/SOCIAL WORK SECTION    Inpatient Status Date: 11-24-2017  Readmission Risk Assessment Score:  Readmission Risk              Risk of Unplanned Readmission:        13           Discharging to Facility/ Agency   ?? Name:    [facility]  ?? Address:  ?? Phone:   [phone]  ?? Fax:        [phone]      Case Manager/Social Worker signature: Mardella Layman, [place]     Case Management   [phone]    11/27/2017  3:57 PM  PHYSICIAN SECTION    Prognosis: Good    Condition at Discharge: Stable    Rehab Potential (if transferring to Rehab): Good    Recommended  Labs or Other Treatments After Discharge: PT,OT,VN    Physician Certification: I certify the above information and transfer of Inola A Scarpulla  is necessary for the continuing treatment of the diagnosis listed and that she requires Home Care for less 30 days.     Update Admission H&P: No change in H&P    PHYSICIAN SIGNATURE:  Electronically signed by Barth Kirks, MD on 11/28/17 at 1:10 PM

## 2017-11-27 NOTE — Plan of Care (Signed)
Problem: Risk for Impaired Skin Integrity  Goal: Tissue integrity - skin and mucous membranes  Description  Structural intactness and normal physiological function of skin and  mucous membranes.  Outcome: Ongoing  Note:   Pt is at risk for impaired skin integrity. Assess skin every shift and prn. Turn every 2 hours. Keep heels off bed. Keep skin clean and dry. Hip surgical incision with dressing.     Problem: PAIN  Goal: STG - pain is manageable through therapies  Outcome: Ongoing  Note:   Assess pts level of pain. Medicate as ordered for pain. Evaluate effectiveness of pain med given. Notify physician if pain not controlled. Alternative pain reliever rest and repositioning.     Problem: Infection - Surgical Site:  Goal: Will show no infection signs and symptoms  Description  Will show no infection signs and symptoms  Outcome: Ongoing  Note:   Surgical incisions to right hip.

## 2017-11-27 NOTE — Progress Notes (Signed)
Department of Physical Medicine & Rehabilitation  Dr. Wyatt Haste Progress Note    Patient Identification:  Linda [name]  [id number]  DOB: 1946-05-13  Admit date: 11/24/2017      Diagnosis:   Patient Active Problem List   Diagnosis   ??? Hyperlipidemia   ??? Senile osteoporosis   ??? Menopausal and postmenopausal disorder   ??? GERD (gastroesophageal reflux disease)   ??? Migraine headache   ??? Colon polyps   ??? Depression   ??? Intractable headache   ??? Hypoxemia   ??? Osteoporosis   ??? Intractable chronic migraine without aura   ??? Medication overuse headache   ??? Iron deficiency anemia secondary to blood loss (chronic)   ??? Other intestinal malabsorption   ??? Graves disease   ??? Closed intertrochanteric fracture of hip, right, initial encounter ([place])   ??? Closed fracture of right wrist   ??? Closed fracture of right distal radius   ??? Closed fracture of intertrochanteric section of femur, right, sequela           Subjective:  Pt seen this AM. Patient will be discussed this AM in [place] with entire Rehab Team of PT, OT, Speech, Dietary, Nursing, and Social Service about the progress made in Rehab Unit therapies, and when patient will be ready for discharge. We think patient will be ready for discharge to??home??this Saturday with home therapies and equipment. Patient will need a rolling platform walker and transfer tub bench at home at discharge. Repeat labs today reveal normal potassium level at this time.      BP 111/70    Pulse 64    Temp 98.1 ??F (36.7 ??C) (Oral)    Resp 16    Ht 5\' 4"  (1.626 m)    Wt 154 lb 12.2 oz (70.2 kg)    SpO2 94%    BMI 26.57 kg/m??     Last 24 hour lab  No results found for this or any previous visit (from the past 24 hour(s)).    Therapy progress:  PT  Position Activity Restriction  Other position/activity restrictions: RLE WBAT, NWBing RUE  Objective     Sit to Stand: Stand by assistance(cues for hand placement)  Stand to sit: Stand by assistance(cues for hand placement)  Bed to Chair: Stand by  assistance  Device: Platform Walker right  Assistance: Catering manager assistance, Stand by assistance  Distance: 150', short distances in gym  OT  PT Equipment Recommendations  Equipment Needed: Yes  Mobility Devices: Lawyer: Platform Right, Financial planner - Technique: Ambulating  Equipment Used: Grab bars(toilet safety frame)                  ??  Assessment and Plan:  ??  right intertrochanteric hip fracture- repaired 4/19 with IM nailing by Dr. Bridgett Larsson, weightbearing as tolerated right leg  ??  Right wrist fracture- repaired 4/19 with ORIF by Dr. Barnet Pall, no weightbearing right wrist.   ??  Depression -Paxil  ??  GERD-protonix  ??  Graves disease  ??  osteoporosis ??- [place]  ??  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   ??  Bowels: Schedule Miralax + Senna S. Follow bowel movements. Enema or suppository if needed.   ??  Bladder: Check PVR x 3.  [place] if PVR > 248ml or if any volume is > 500 ml.   ??  Pain: Percocet is ordered prn.   ??    Barth Kirks, MD, 11/27/2017, 7:39 AM

## 2017-11-27 NOTE — Care Coordination-Inpatient (Addendum)
[facility] received referral from SW for:  WHEELED WALKER with RIGHT PLATFORM ATTACHMENT    11/28/17 - Per OT, patient will need a Cashion. This is covered by patient's insurance.     Will need MD Orders for RW, Platform, TSF.    Will verify patient's insurance and follow up with patient to deliver the ordered item(s) prior to discharge.    Thank you for the referral.  Electronically signed by Blanche East on 11/27/2017 at 4:22 PM  Cell ph# [phone]

## 2017-11-27 NOTE — Progress Notes (Addendum)
Patient is a 72 y/o female admitted to rehab with fractured right hip with repair.  A/A/O x 4. Transfers with assist of walker and gait belt. On general diet, tolerating well. Medications taken whole in water.  On *lovenox for DVT prophylaxis.   Skin intact with surgical incisions to right leg.  On room air . Has been continent of urine and stool.Will monitor for safety. Dressing change to right hip top mepilex incision well approximated no complaints.

## 2017-11-27 NOTE — Progress Notes (Addendum)
Physical Therapy  Facility/Department: [facility]  Daily Treatment Note- AM and PM  NAME: Linda [name]  DOB: Apr 02, 1946  MRN: [id number]    Date of Service: 11/27/2017    Discharge Recommendations:  Continue to assess pending progress   PT Equipment Recommendations  Equipment Needed: Yes  Walker: Platform Right;Rolling    Patient Diagnosis(es): There were no encounter diagnoses.     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   has a past surgical history that includes shoulder surgery; Artery Biopsy (Right, 12-24-12); Upper gastrointestinal endoscopy (08/26/2014); Colonoscopy ([id number]); Colonoscopy (N/A, 07/09/2017); Colonoscopy (N/A, 07/09/2017); Upper gastrointestinal endoscopy (N/A, 08/08/2017); esophageal motility study (N/A, 08/27/2017); Fixation Kyphoplasty; Wrist fracture surgery (Right, 11/22/2017); hip surgery (Right, 11/22/2017); Hip fracture surgery (Right, 11/22/2017); and ORIF DISTAL RADIUS FRACTURE (Right, 11/22/2017).    Restrictions  Restrictions/Precautions  Restrictions/Precautions: Fall Risk  Position Activity Restriction  Other position/activity restrictions: RLE WBAT, NWBing RUE  Subjective   General  Chart Reviewed: Yes  Additional Pertinent Hx: fall on stairs resulted in an intertrochanteric femur fx R and distal radial fx. patient had surgery 4/19 with s/p gamma nail and ORIF R radius with C- arm. Patient is WBAT R LE. PMH -Grave's disease, HTN, migraines, S/P LB surgery, L humeral fx with surgery  Response To Previous Treatment: Patient with no complaints from previous session.  Family / Caregiver Present:  No  Subjective  Subjective: Patient descibe pain 5/10 on R lateral femur. Patient agreeable to therapy.          Orientation  Orientation  Overall Orientation Status: Within Functional Limits  Cognition      Objective      Transfers  Sit to Stand: Stand by assistance(cues for hand placement)  Stand to sit: Stand by assistance  Ambulation  Ambulation?: Yes  Ambulation 1  Surface: level tile  Device: Platform Walker right  Assistance: Stand by assistance  Quality of Gait: Patient with antalgic gait, decreased WB R LE, step to gait pattern with slow cadence  Distance: 220', short distances in gym  Stairs/Curb  Stairs?: Yes  Stairs  # Steps : 1  Curbs: 6"  Device: Rolling walker(R platform wheeled walker)  Assistance: Minimal assistance;Contact guard assistance  Comment: assist for wheeled walker with platform, cues to sequence        Exercises  Gluteal Sets: 12  Hip Flexion: 20 assist with R LE  Hip Abduction: adductor sets x 12,   Knee Long Arc Quad: 15 B LE  Ankle Pumps: 20        Second session-  S/ pain level 3/10 R hip, agreeable to therapy.  O/ Sit to stand with SBA   Patient ambulated with R platform wheeled walker WBAT R LE with SBA, 180', short distance in gym  Patient sit to supine with SBA towards L side of bed.  Performed supine exercises -   GS, QS, Adductor sets x 15, HS with orange tube x 15, hip abd x 10 with orange tube, SAQ x 15, SLR with minimal assist x 5, hip and knee flex/extension x 15  Supine to sit with SBA  Patient given written HEP.  Patient to room via transporter  Assessment   Body structures, Functions, Activity  limitations: Decreased functional mobility ;Decreased strength;Decreased ADL status;Decreased balance;Decreased endurance;Decreased ROM;Increased Pain  Assessment: Patient is a 72 y/o female with S/P gamma nail of R femur and ORIF R radius with c-arm on 11/22/17. Patient s/p fall on stairs and was independent with no device prior to fall. Patient has already met 4/4 STG, apppears  pain control improved as well as confidence. Patient transfer with SBA with cues for hand placement, ambulate with R platform wheeled walker 220' with SBA, curb step with AD with min/CGA  .  Patient limited by pain , weakness and decreased endurance. patient will benefit from ARU to improve overall independence with mobility.Patient would like D/C by Saturday, 4/27, Plan to discuss with OT.   Prognosis: Good  Patient Education: safety with transfers and gait, curb, importance of exercises  REQUIRES PT FOLLOW UP: Yes  Activity Tolerance  Activity Tolerance: Patient Tolerated treatment well;Patient limited by pain     Goals  Short term goals  Time Frame for Short term goals: 7 days  Short term goal 1: bed mobility at SBA- met 4/23  Short term goal 2: transfers at stand by assist to platform walker- met 4/23  Short term goal 3: ambulation with R platform wheeled walker 150' with SBA- met 4/24  Short term goal 4: ascend/descend curb step with min/CGA- met 4/23  Long term goals  Time Frame for Long term goals : 10 days  Long term goal 1: bed mobility at MI  Long term goal 2: transfers at MI with platform walker  Long term goal 3: ambulation with R platform wheeled walker 150' with MI  Long term goal 4: ascend/descend curb step with CGA  Long term goal 5: ascend /descend 4 steps with CGA and assist of another  Patient Goals   Patient goals : to go home and be able to help huband with cooking ect    Plan    Plan  Times per week: 5-6x   Times per day: Twice a day  Plan weeks: 7-10 days  Current Treatment Recommendations: Strengthening, ROM, Location manager, Building services engineer, Teacher, early years/pre, Archivist, Teaching laboratory technician, Stair training, Pain Management, Home Exercise Program, Equities trader, Glass blower/designer, Investment banker, operational, Patent attorney, Mining engineer, Education, & procurement  Safety Devices  Type of devices: All fall risk precautions in place, Gait  belt(transporter to take patient back to room)  Restraints  Initially in place: No     Therapy Time   Individual Concurrent Group Co-treatment   Time In 0815         Time Out 0900         Minutes 45         Timed Code Treatment Minutes: 45 Minutes    Second Session Therapy Time     Individual Co-treatment   Time In 1300    Time Out 1345    Minutes 45         Donneta Romberg, PT # [phone]
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
## 2017-11-27 NOTE — Progress Notes (Signed)
[facility] Note      Chief Complaint: My hip still hurts a lot    Subjective:    Still with a lot of hip pain.  Her pain regimen is not helping her just yet.  It sounds like she's only been taking one Percocet at a time as needed with her OxyContin.  Bowels have not moved in several days.  No other new problems.  Despite the pain, she feels she is getting better.    No chest pain or shortness breath. No cough or sputum. No nausea, vomiting, diarrhea. No abdominal pain. No dysuria.  The remainder of the review of systems is negative.     PMH, PSH, FH/SH reviewed and unchanged as documented in the H&P personally documented at admission 11/21/17    Medication list reviewed    Objective:    BP 111/70    Pulse 64    Temp 98.1 ??F (36.7 ??C) (Oral)    Resp 16    Ht 5\' 4"  (1.626 m)    Wt 154 lb 12.2 oz (70.2 kg)    SpO2 97%    BMI 26.57 kg/m??   Temp  Avg: 98.3 ??F (36.8 ??C)  Min: 98.1 ??F (36.7 ??C)  Max: 98.5 ??F (36.9 ??C)    Exam essentially unchanged:  RRR  Chest-chest clear throughout, respirations easy  Abd-sounds positive, soft, nontender, nondistended  Ext- no edema, right wrist in splint.    The Following Labs Were Reviewed Today:    No results found for this or any previous visit (from the past 24 hour(s)).    ASSESSMENT/PLAN:      Principal Problem:    Closed intertrochanteric fracture of hip, right/Closed fracture of right wrist- continue rehabilitation. Add Dulcolax today for constipation.  Active Problems:    Iron deficiency anemia secondary to blood loss (chronic)-repeat CBC pending for tomorrow.    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    GERD (gastroesophageal reflux disease)-stable    Graves disease- Tapazole stopped over the weekend. Continue to monitor thyroid function.    Hypokalemia-repeat potassium level today. BMP pending for tomorrow.    Mohid Furuya Tomasita Crumble, MD, FACP  8:31 AM  11/27/2017

## 2017-11-27 NOTE — Care Coordination-Inpatient (Signed)
Team Conference held today.  Team reviewed progress and goals.  TEam reports she continues with NWB on right arm.  DME recommendations:  R platform [place] walker, ttb, tsf.  Team recommends SN/PT/OT for home care orders.  Team recommends DC to home on Saturday 11-30-2017.  LSW met with patient to review.  She is very happy with this plan.  She wants to use [facility] for home orders.  Her spouse will pick her up between 10 - 12 noone.  She states her spouse will pick up the TTB and TSF but does want [place] to order wh walker Right Platform.  She agrees with this plan.  Referral made to [facility] for Right Platform wh walker.  [place], [place]     Case Management   [phone]    11/27/2017  3:55 PM

## 2017-11-28 LAB — BASIC METABOLIC PANEL
Anion Gap: 8 (ref 3–16)
BUN: 10 mg/dL (ref 7–20)
CO2: 31 mmol/L (ref 21–32)
Calcium: 8.8 mg/dL (ref 8.3–10.6)
Chloride: 99 mmol/L (ref 99–110)
Creatinine: 0.5 mg/dL — ABNORMAL LOW (ref 0.6–1.2)
GFR African American: >60 (ref >60)
GFR Non-African American: >60 (ref >60)
Glucose: 98 mg/dL (ref 70–99)
Potassium: 3.8 mmol/L (ref 3.5–5.1)
Sodium: 138 mmol/L (ref 136–145)

## 2017-11-28 LAB — CBC
Hematocrit: 25.6 % — ABNORMAL LOW (ref 36.0–48.0)
Hemoglobin: 8.6 g/dL — ABNORMAL LOW (ref 12.0–16.0)
MCH: 33.2 pg (ref 26.0–34.0)
MCHC: 33.5 g/dL (ref 31.0–36.0)
MCV: 99.3 fL (ref 80.0–100.0)
MPV: 7.3 fL (ref 5.0–10.5)
Platelets: 214 10*3/uL (ref 135–450)
RBC: 2.58 M/uL — ABNORMAL LOW (ref 4.00–5.20)
RDW: 13.9 % (ref 12.4–15.4)
WBC: 4 10*3/uL (ref 4.0–11.0)

## 2017-11-28 MED ORDER — ASPIRIN EC 325 MG PO TBEC
325 MG | ORAL_TABLET | Freq: Every day | ORAL | 0 refills | Status: DC
Start: 2017-11-28 — End: 2017-12-24

## 2017-11-28 MED ORDER — CALCIUM-VITAMIN D 500-200 MG-UNIT PO TABS
500-200 MG-UNIT | Freq: Two times a day (BID) | ORAL | Status: DC
Start: 2017-11-28 — End: 2017-11-30
  Administered 2017-11-28 – 2017-11-30 (×4): 1 via ORAL

## 2017-11-28 MED ORDER — OXYCODONE-ACETAMINOPHEN 5-325 MG PO TABS
5-325 MG | ORAL_TABLET | ORAL | 0 refills | Status: AC | PRN
Start: 2017-11-28 — End: 2017-12-12

## 2017-11-28 MED FILL — OXYCONTIN 10 MG PO T12A: 10 mg | ORAL | Qty: 2

## 2017-11-28 MED FILL — ACETAMINOPHEN 325 MG PO TABS: 325 mg | ORAL | Qty: 2

## 2017-11-28 MED FILL — PAROXETINE HCL 20 MG PO TABS: 20 mg | ORAL | Qty: 1

## 2017-11-28 MED FILL — PEG 3350 17 G PO PACK: 17 g | ORAL | Qty: 1

## 2017-11-28 MED FILL — POTASSIUM CHLORIDE CRYS ER 20 MEQ PO TBCR: 20 meq | ORAL | Qty: 1

## 2017-11-28 MED FILL — LOVENOX 40 MG/0.4ML SC SOLN: 40 MG/0.4ML | SUBCUTANEOUS | Qty: 0.4

## 2017-11-28 MED FILL — PANTOPRAZOLE SODIUM 40 MG PO TBEC: 40 mg | ORAL | Qty: 1

## 2017-11-28 MED FILL — CALCIUM-VITAMIN D 500-200 MG-UNIT PO TABS: 500-200 MG-UNIT | ORAL | Qty: 1

## 2017-11-28 MED FILL — FERREX 150 150 MG PO CAPS: 150 mg | ORAL | Qty: 1

## 2017-11-28 MED FILL — OYSTER SHELL CALCIUM/D 500-200 MG-UNIT PO TABS: 500-200 MG-UNIT | ORAL | Qty: 1

## 2017-11-28 MED FILL — DOK PLUS 50-8.6 MG PO TABS: 50-8.6 mg | ORAL | Qty: 2

## 2017-11-28 MED FILL — DIVALPROEX SODIUM ER 500 MG PO TB24: 500 mg | ORAL | Qty: 1

## 2017-11-28 NOTE — Progress Notes (Signed)
Patient is a 72 y/o female admitted to rehab with femur fracture and distal radius fracture with right hip gamma nailing and pinning. A/A/O x4. Transfers with assist of a platform walker. On a general diet, tolerating well. Medications taken whole with thin liquids. On lovenox for DVT prophylaxis. Pt has 3 surgical incisions approximated with staples covered with Mepilex dressings. On room air. Has been continent of urine and stool. LBM 11/26/2017. Will monitor for safety. Electronically signed by Jesusita Oka, RN on 11/28/2017 at 3:32 PM

## 2017-11-28 NOTE — Progress Notes (Signed)
Occupational Therapy  OCCUPATIONAL THERAPY  Progress Note   Second Session    Patient Name: Linda [name]  Medical Record Number: [id number]         General  Chart Reviewed: Yes  Patient assessed for rehabilitation services?: Yes  Additional Pertinent Hx: Pt is 72 y.o. F who presents s/p mechanical fall down steps at home resulting in R intertrochanteric femur fracture and R distal radius fracture. Pt is WBAT on RLE and NWBing on RUE. PMH: Iron deficiency anemia, thoracic compression fx, depression  Family / Caregiver Present: No  Referring Practitioner: Dr. Wyatt Haste  Diagnosis: nondisplaced intertrochanteric fx of R femur     Restrictions/Precautions  Restrictions/Precautions: Fall Risk        Position Activity Restriction  Other position/activity restrictions: RLE WBAT, NWBing RUE    Subjective: Patient descibe pain 3/10 on R lateral femur, improved pain control. Patient agreeable to therapy.    Objective: agreeable for household t/f, car t/f, toileting & bed mobility    Assessment: Pt tolerated session well.  Pt issued information to order tub bench from [place].  Instructed her that if they do not get free shipping to let us know and we will get them pricing from other stores that are local.  She completed car t/f --   she was able to approach car using platform RW, good recall safe hand placement and talking herself thru each step; able to lift each LE in/out, able to exit car using handle w/ L hand. She also completed toilet, w/c and bed transfers w/ MI using platform RW--good recall of safe hand placement and talking herself thru steps of the activity. Pt remains impulsive (premorbid behavior) which makes her transfers a safety concern. Pt completed toilet tasks w/ MI, good recall of safe hand placement, she forgot to manage her clothing but realized her mistake and corrected the error. Pt was able to ambulate over to sink to wash hands and then complete bed mobility w/ MI, bed flat used rail in L hand. Pt  making good gains w/ being IND, Pt making good gains w/ being IND. Cont w/ POC, tx time: 32 min Erenest Blank, OTR/L [phone]         Safety Device - Type of devices:  []   All fall risk precautions in place [x]  Bed alarm in place  [x]  Call light within reach []  Chair alarm in place []  Positioning belt [x]  Gait belt []  Patient at risk for falls [x]  Left in bed []  Left in chair []  Telesitter in use []  Sitter present []  Nurse notified []   None      Therapy Time   Individual Co-treatment   Time In 4332    Time Out 1610    Minutes 55      Electronically signed by Viona Gilmore, OTR/L [phone] on 11/28/2017 at 3:21 PM

## 2017-11-28 NOTE — Progress Notes (Signed)
Occupational Therapy  Facility/Department: [facility]  Daily Treatment Note  NAME: Linda [name]  DOB: 10/06/1945  MRN: [id number]    Date of Service: 11/28/2017    Discharge Recommendations:  Home with assist PRN, S Level 3  OT Equipment Recommendations  Walker: Platform Right;Rolling  ADL Assistive Devices: Transfer Tub Bench;Long-handled Sponge;Sock-Aid Manufacturing engineer;Toilet Safety Frame;Long-handled Conservation officer, historic buildings  Other: Will order transfer bench from [place].  Wants to get toilet safety frame from [facility]    Assessment   Performance deficits / Impairments: Decreased functional mobility ;Decreased strength;Decreased endurance;Decreased ADL status;Decreased balance;Decreased high-level IADLs  Assessment: Pt tolerated session well.  Pt issued information to order tub bench from [facility].  Instructed her that if they do not get free shipping to let us know and we will get them pricing from other stores that are local.  Pt completed transfers with CGA/SBA.  Pt set for discharge on Saturday.  Treatment Diagnosis: impaired func mob, transfers, and ADL status   Prognosis: Good  History: PMH: Iron deficiency anemia, thoracic compression fx, depression  Patient Education: Tub transfer, bed mobility, DME  REQUIRES OT FOLLOW UP: Yes  Activity Tolerance  Activity Tolerance: Patient Tolerated treatment well  Safety Devices  Type of devices: Gait belt         Patient Diagnosis(es): There were no encounter diagnoses.      has a past medical history of Bisphosphonate-associated osteonecrosis of the jaw [facility]), Celiac artery atherosclerosis, Colon polyps, Dyspepsia, Graves disease, High cholesterol, History of depression, Hypertension, Iron deficiency anemia secondary to blood loss (chronic), Mass of parotid gland, Migraine, Osteoarthritis of lumbar spine, Other intestinal malabsorption, Postmenopausal, and Thoracic compression fracture ([place]).   has a past surgical history that includes shoulder surgery;  Artery Biopsy (Right, 12-24-12); Upper gastrointestinal endoscopy (08/26/2014); Colonoscopy ([id number]); Colonoscopy (N/A, 07/09/2017); Colonoscopy (N/A, 07/09/2017); Upper gastrointestinal endoscopy (N/A, 08/08/2017); esophageal motility study (N/A, 08/27/2017); Fixation Kyphoplasty; Wrist fracture surgery (Right, 11/22/2017); hip surgery (Right, 11/22/2017); Hip fracture surgery (Right, 11/22/2017); and ORIF DISTAL RADIUS FRACTURE (Right, 11/22/2017).    Restrictions  Restrictions/Precautions  Restrictions/Precautions: Fall Risk  Position Activity Restriction  Other position/activity restrictions: RLE WBAT, NWBing RUE  Subjective   General  Chart Reviewed: Yes  Patient assessed for rehabilitation services?: Yes  Additional Pertinent Hx: Pt is 72 y.o. F who presents s/p mechanical fall down steps at home resulting in R intertrochanteric femur fracture and R distal radius fracture. Pt is WBAT on RLE and NWBing on RUE. PMH: Iron deficiency anemia, thoracic compression fx, depression  Family / Caregiver Present: No  Referring Practitioner: Dr. Wyatt Haste  Diagnosis: nondisplaced intertrochanteric fx of R femur  Subjective  Subjective: Pt seen in department.  Pt c/o 3/10 pain right upper leg.  Objective          Functional Mobility  Functional - Mobility Device: Platform walker  Activity: Other  Assist Level: Contact guard assistance(CGA/SBA)  Toilet Transfers  Toilet Transfers Comments: Pt would like to purchase toilet safety frames from [facility].  Called Lattie Haw to make referral.  Tub Transfers  Tub - Transfer From: Rolling walker(platform)  Tub - Transfer Type: To and From  Tub - Transfer To: Transfer tub bench  Tub - Technique: Ambulating  Tub Transfers: Contact guard  Tub Transfers Comments: Pt instructed on dry tub transfer.  Issued information for purchasing a tub transfer bench.  Pt states she has [employer] so printed out page for [employer] transfer tub bench to purchase on [place].  Wheelchair Bed  Transfers  Wheelchair/Bed - Technique: Ambulating  Equipment Used: Wheelchair;Bed  Level of Asssistance: Contact guard assistance;Stand by assistance  Bed mobility  Rolling to Left: Stand by assistance  Rolling to Right: Stand by assistance  Supine to Sit: Stand by assistance  Sit to Supine: Stand by assistance           Plan   Plan  Times per week: 5-6x  Times per day: Twice a day  Plan weeks: DC Sat w/ husband + HH services  Specific instructions for Next Treatment: kitchen mobility  Current Treatment Recommendations: Strengthening, Therapist, nutritional, Engineer, production, Hotel manager, Proofreader, Barrister's clerk, Education, Visual merchandiser, Child psychotherapist, Research officer, political party / ADL, Home Management Training  Plan Comment: WED conf    Goals  Short term goals  Time Frame for Short term goals: 4 days  Short term goal 1: Pt will complete functional mobility and transfers with SBA and use of AD  Short term goal 2: Pt will complete grooming in stance at sink with SBA  Short term goal 3: Pt will complete dressing and bathing SBA and use of AD as needed  Short term goal 4: Pt will complete kitchen mobility with SBA  Short term goal 5: Pt will complete toileting with SBA  Long term goals  Time Frame for Long term goals : 7 days  Long term goal 1: Pt will complete functional mobility and transfers with Mod Ind   Long term goal 2: Pt will complete grooming in stance at sink Mod Ind  Long term goal 3: Pt will complete dressing and bathing Mod Ind   Long term goal 4: Pt will complete toileting Mod Ind  Long term goal 5: Pt will complete simple meal prep Mod Ind  Patient Goals   Patient goals : to return home       Therapy Time   Individual Concurrent Group Co-treatment   Time In 0845         Time Out 0930         Minutes [address] [place]

## 2017-11-28 NOTE — Progress Notes (Signed)
Physical Therapy  Facility/Department: [facility]  Daily Treatment Note  NAME: Linda [name]  DOB: 03/11/1946  MRN: [id number]    Date of Service: 11/28/2017    Discharge Recommendations:  Home with assist PRN, Home with Home health PT, S Level 1, Patient would benefit from continued therapy after discharge   PT Equipment Recommendations  Equipment Needed: Yes  Mobility Devices: Loyal Buba: Platform Right;Rolling    Patient Diagnosis(es): There were no encounter diagnoses.     has a past medical history of Bisphosphonate-associated osteonecrosis of the jaw [facility]), Celiac artery atherosclerosis, Colon polyps, Dyspepsia, Graves disease, High cholesterol, History of depression, Hypertension, Iron deficiency anemia secondary to blood loss (chronic), Mass of parotid gland, Migraine, Osteoarthritis of lumbar spine, Other intestinal malabsorption, Postmenopausal, and Thoracic compression fracture ([place]).   has a past surgical history that includes shoulder surgery; Artery Biopsy (Right, 12-24-12); Upper gastrointestinal endoscopy (08/26/2014); Colonoscopy ([id number]); Colonoscopy (N/A, 07/09/2017); Colonoscopy (N/A, 07/09/2017); Upper gastrointestinal endoscopy (N/A, 08/08/2017); esophageal motility study (N/A, 08/27/2017); Fixation Kyphoplasty; Wrist fracture surgery (Right, 11/22/2017); hip surgery (Right, 11/22/2017); Hip fracture surgery (Right, 11/22/2017); and ORIF DISTAL RADIUS FRACTURE (Right, 11/22/2017).    Restrictions  Restrictions/Precautions  Restrictions/Precautions: Fall Risk  Position Activity Restriction  Other position/activity restrictions: RLE WBAT, NWBing RUE     Social/Functional History  Lives With: Spouse(24 hour supervision )  Type of Home: House  Home Layout: Laundry in basement, Two level, Able to Live on Main level with bedroom/bathroom, Performs ADL's on one level(Pt does not have to go to second floor )  Home Access: Stairs to enter without rails  Entrance Stairs - Number of Steps: 1 or 2  small steps in garage   Bathroom Shower/Tub: Administrator, Civil Service: Standard  Home Equipment: Crutches  ADL Assistance: Independent  Homemaking Assistance: Independent  Ambulation Assistance: Independent  Transfer Assistance: Independent  Active Driver: Yes  Occupation: Retired  Type of occupation: Network engineer  Leisure & Hobbies: visit grandchildren    Subjective   General  Chart Reviewed: Yes  Additional Pertinent Hx: fall on stairs resulted in an intertrochanteric femur fx R and distal radial fx. patient had surgery 4/19 with s/p gamma nail and ORIF R radius with C- arm. Patient is WBAT R LE. PMH -Grave's disease, HTN, migraines, S/P LB surgery, L humeral fx with surgery  Response To Previous Treatment: Patient with no complaints from previous session.  Family / Caregiver Present: No  Subjective  Subjective: Patient descibe pain 3/10 on R lateral femur. Patient agreeable to therapy.                Objective   Bed mobility  Supine to Sit: Stand by assistance  Sit to Supine: Stand by assistance  Comment: flat therapy mat, no rail; cues for NWB RUE during sup to sit     Transfers  Sit to Stand: Stand by assistance  Stand to sit: Stand by assistance  Car Transfer: Stand by assistance(cues for hand placement and technique; pt reports having low sports car at home- advised to use cushion to build up seat)  Comment: cues for R hand placement; pt initially began tipping RW by putting R arm on platform prior to standing and then putting pressure through it; then demonstrated proper technique with cuing     Ambulation  Ambulation?: Yes  More Ambulation?: No  Ambulation 1  Surface: level tile  Device: Platform Walker right  Assistance: Stand by assistance  Quality of Gait: reciprocal gait pattern,  slightly antalgic gait, cadence improving, no LOB  Distance: 220'x2 and short distances in therapy gym  Stairs/Curb  Stairs?: Yes  Stairs  Curbs: 6"(one curb step)  Device: Rolling walker(with R platform)  Assistance:  Minimal assistance;Contact guard assistance  Comment: minA for lifting and lowering RW only due to NWB RUE; cues for technique        Exercises  Hamstring Sets: x20 hamstring curls with lime green t band LLE, x20 no resistance RLE  Gluteal Sets: x20 with 5 sec hold  Hip Flexion: x15 alternating; decreased ROM RLE  Hip Abduction: adductor sets x 20; hip ABD bil no resistance x20  Knee Long Arc Quad: x15 alternating  Ankle Pumps: x20 heel raises/toe raises  Comments: ther ex seated in w/c  Other exercises  Other exercises?: Yes  Other exercises 1: NuStep 9 min, BLEs and LUE, resistance level 1, slow pace for increased ROM RLE       Assessment   Body structures, Functions, Activity limitations: Decreased functional mobility ;Decreased strength;Decreased ADL status;Decreased balance;Decreased endurance;Decreased ROM;Increased Pain  Assessment: Patient is a 72 y/o female with S/P gamma nail of R femur and ORIF R radius with c-arm on 11/22/17. Patient s/p fall on stairs and was independent with no device prior to fall.  Pt able to ambulate 220' x2 with R platform walker and SBA and SBA for transfers with intermittent cues for safety and hand placement.  Pt able to ascend/descend curb step with CGA and minA to lift/lower RW due to NWB RUE.  Patient limited by pain, weakness and decreased endurance. patient will benefit from ARU to improve overall independence with mobility.  Plan for D/C Saturday, 4/27.  Prognosis: Good  Patient Education: safety with transfers and gait, curb, NWB RUE  REQUIRES PT FOLLOW UP: Yes  Activity Tolerance  Activity Tolerance: Patient Tolerated treatment well;Patient limited by pain     Goals  Short term goals  Time Frame for Short term goals: 7 days  Short term goal 1: bed mobility at SBA- met 4/23  Short term goal 2: transfers at stand by assist to platform walker- met 4/23  Short term goal 3: ambulation with R platform wheeled walker 150' with SBA- met 4/24  Short term goal 4: ascend/descend  curb step with min/CGA- met 4/23  Long term goals  Time Frame for Long term goals : 10 days  Long term goal 1: bed mobility at MI  Long term goal 2: transfers at MI with platform walker  Long term goal 3: ambulation with R platform wheeled walker 150' with MI  Long term goal 4: ascend/descend curb step with CGA  Long term goal 5: ascend /descend 4 steps with CGA and assist of another  Patient Goals   Patient goals : to go home and be able to help huband with cooking ect    Plan    Plan  Times per week: 5-6x   Times per day: Twice a day  Plan weeks: 7-10 days  Current Treatment Recommendations: Strengthening, ROM, Hotel manager, Therapist, nutritional, Training and development officer, Psychologist, clinical, Engineer, production, Stair training, Pain Management, Home Exercise Program, Child psychotherapist, Human resources officer, Personnel officer, Proofreader, Barrister's clerk, Education, & procurement  Safety Devices  Type of devices: All fall risk precautions in place, Gait belt  Restraints  Initially in place: No     Therapy Time   Individual Concurrent Group Co-treatment   Time In 1030  Time Out 1200         Minutes 90         Timed Code Treatment Minutes: 90 Minutes         Electronically signed by Anette Riedel, PT [phone] on 11/28/2017 at 12:00 PM

## 2017-11-28 NOTE — Plan of Care (Signed)
Problem: PAIN  Goal: LTG - Patient will manage pain with the appropriate technique/Intervention.   Outcome: Ongoing       Problem: PAIN  Goal: STG - Patient will verbalize an acceptable level of pain. Pt verbalized an acceptable level of pain is at a 2.  Outcome: Ongoing     Problem: Pain:  Goal: Pain level will decrease. Patient's pain level has decreased.    Description  Pain level will decrease  Outcome: Ongoing

## 2017-11-28 NOTE — Progress Notes (Signed)
Patient is a 72 y.o female admitted with a fractured right hip with repair. A/Ox4. Transfers with platform walker. Surgical incisions to right leg. On a general diet. Continent of urine and stool.

## 2017-11-28 NOTE — Progress Notes (Signed)
Department of Physical Medicine & Rehabilitation  Dr. Wyatt [name] Progress Note    Patient Identification:  Linda [name]  [id number]  DOB: Dec 30, 1945  Admit date: 11/24/2017      Diagnosis:   Patient Active Problem List   Diagnosis   ??? Hyperlipidemia   ??? Senile osteoporosis   ??? Menopausal and postmenopausal disorder   ??? GERD (gastroesophageal reflux disease)   ??? Migraine headache   ??? Colon polyps   ??? Depression   ??? Intractable headache   ??? Hypoxemia   ??? Osteoporosis   ??? Intractable chronic migraine without aura   ??? Medication overuse headache   ??? Iron deficiency anemia secondary to blood loss (chronic)   ??? Other intestinal malabsorption   ??? Graves disease   ??? Closed intertrochanteric fracture of hip, right, initial encounter ([place])   ??? Closed fracture of right wrist   ??? Closed fracture of right distal radius   ??? Closed fracture of intertrochanteric section of femur, right, sequela           Subjective: Pt seen this AM. Patient was discussed yesterday in [place] with entire Rehab Team, and we think patient will be ready for discharge to??home this Saturday with home therapies and equipment. Patient will need a rolling platform walker and transfer tub bench at home at discharge. Will fill out her COC and meds tomorrow for her discharge Saturday.      BP (!) 136/95    Pulse 83    Temp 97.7 ??F (36.5 ??C) (Oral)    Resp 16    Ht 5\' 4"  (1.626 m)    Wt 145 lb 4.5 oz (65.9 kg)    SpO2 93%    BMI 24.94 kg/m??     Last 24 hour lab  Recent Results (from the past 24 hour(s))   Potassium    Collection Time: 11/27/17  8:42 AM   Result Value Ref Range    Potassium 4.4 3.5 - 5.1 mmol/L       Therapy progress:  PT  Position Activity Restriction  Other position/activity restrictions: RLE WBAT, NWBing RUE  Objective     Sit to Stand: Stand by assistance(cues for hand placement)  Stand to sit: Stand by assistance  Bed to Chair: Stand by assistance  Device: Landscape architect right  Assistance: Stand by assistance  Distance: 220', short  distances in gym  OT  PT Equipment Recommendations  Equipment Needed: Yes  Mobility Devices: Lawyer: Platform Right, Financial planner - Technique: Stand pivot  Equipment Used: Tree surgeon Transfers Comments: min VCs not to use R hand, likes to lean on forearm/elbow                  ??  Assessment and Plan:  ??  right intertrochanteric hip fracture- repaired 4/19 with IM nailing by Dr. Bridgett [name], weightbearing as tolerated right leg  ??  Right wrist fracture- repaired 4/19 with ORIF by Dr. Barnet [name], no weightbearing right wrist.   ??  Depression -Paxil  ??  GERD-protonix  ??  Graves disease  ??  osteoporosis ??- Os-Cal  ??  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   ??  Bowels: Schedule Miralax + Senna S. Follow bowel movements. Enema or suppository if needed.   ??  Bladder: Check PVR x 3.  [place] if PVR > 241ml or if any volume is > 500 ml.   ??  Pain: Percocet is ordered prn.   ??    Linda [name]  Linda Cheeks, MD, 11/28/2017, 7:20 AM

## 2017-11-28 NOTE — Plan of Care (Signed)
Problem: Risk for Impaired Skin Integrity  Goal: Tissue integrity - skin and mucous membranes  Description  Structural intactness and normal physiological function of skin and  mucous membranes.  Outcome: Ongoing  Note:   Pt assessed for skin break down. Skin was warm and dry to touch. Pt able to turn self and regulate head of bed with assistance. Pt reminded to turn or reposition at least every 2 hours to prevent skin breakdown. Will continue to monitor and assess.       Problem: IP BLADDER/VOIDING  Goal: LTG - patient will achieve acceptable level of continence  Outcome: Ongoing  Note:   Pt is continent of urine.      Problem: IP BLADDER/VOIDING  Goal: STG - patient will be able to empty bladder  Outcome: Ongoing  Note:   Pt is able to empty bladder.      Problem: SAFETY  Goal: STG - Patient uses call light consistently to request assistance with transfers  Outcome: Ongoing  Note:   Pt uses call light appropriately.      Problem: SKIN INTEGRITY  Goal: LTG - Patient will be free from infection  Outcome: Ongoing  Note:   Pt is free of signs and symptoms of infection. Incision and dressing are clean, dry and intact. Vital signs stable. Will monitor.       Problem: Pain:  Goal: Pain level will decrease  Description  Pain level will decrease  11/28/2017 1519 by Jesusita Oka, RN  Outcome: Ongoing  Note:   Patient able to express pain and rate pain using pain scale. Medicate as needed per orders. Reassess patient pain level within one hour after oral pain medication/intervention to assure patient has reduced pain sensation and document outcome. Non pharmaceutical interventions as appropriate.

## 2017-11-29 MED FILL — LOVENOX 40 MG/0.4ML SC SOLN: 40 MG/0.4ML | SUBCUTANEOUS | Qty: 0.4

## 2017-11-29 MED FILL — DIVALPROEX SODIUM ER 500 MG PO TB24: 500 mg | ORAL | Qty: 1

## 2017-11-29 MED FILL — OXYCONTIN 10 MG PO T12A: 10 mg | ORAL | Qty: 2

## 2017-11-29 MED FILL — POTASSIUM CHLORIDE CRYS ER 20 MEQ PO TBCR: 20 meq | ORAL | Qty: 1

## 2017-11-29 MED FILL — PEG 3350 17 G PO PACK: 17 g | ORAL | Qty: 1

## 2017-11-29 MED FILL — CALCIUM-VITAMIN D 500-200 MG-UNIT PO TABS: 500-200 MG-UNIT | ORAL | Qty: 1

## 2017-11-29 MED FILL — FERREX 150 150 MG PO CAPS: 150 mg | ORAL | Qty: 1

## 2017-11-29 MED FILL — ACETAMINOPHEN 325 MG PO TABS: 325 mg | ORAL | Qty: 2

## 2017-11-29 MED FILL — PAROXETINE HCL 20 MG PO TABS: 20 mg | ORAL | Qty: 1

## 2017-11-29 MED FILL — PANTOPRAZOLE SODIUM 40 MG PO TBEC: 40 mg | ORAL | Qty: 1

## 2017-11-29 MED FILL — DOK PLUS 50-8.6 MG PO TABS: 50-8.6 mg | ORAL | Qty: 2

## 2017-11-29 NOTE — Progress Notes (Addendum)
Occupational Therapy  OCCUPATIONAL THERAPY  Progress Note   Second Session    Patient Name: Linda [name]  Medical Record Number: [id number]         General  Chart Reviewed: Yes  Patient assessed for rehabilitation services?: Yes  Additional Pertinent Hx: Pt is 72 y.o. F who presents s/p mechanical fall down steps at home resulting in R intertrochanteric femur fracture and R distal radius fracture. Pt is WBAT on RLE and NWBing on RUE. PMH: Iron deficiency anemia, thoracic compression fx, depression  Family / Caregiver Present: No  Referring Practitioner: Dr. Wyatt Haste  Diagnosis: nondisplaced intertrochanteric fx of R femur     Restrictions/Precautions  Restrictions/Precautions: Fall Risk        Position Activity Restriction  Other position/activity restrictions: RLE WBAT, NWBing RUE    Subjective: Patient descibe pain 3/10 on R lateral femur. Patient agreeable to therapy. Feels ready for D/C.    Objective: husband Ronalee Belts present for training. OT adjusted her platform RW to her height & provided tennis balls prior to start of session; educated on purchasing gliders since she has carpet at home. They have ordered TSF and TTB from [employer].    Assessment: Pt tolerated session well, given instruction to husband during t/f. Pt was able to complete toilet tasks + transfer using toilet safety frame on L hand w/ MI. Shown how to apply and adjust it without using tools.  Completed TTB transfer w/ SBA--OT stabilized TTB while pt lifted her LEs in/out of tub. Pt also completed car t/f w/ husband present--she completed w/ MI d/t safety concerns. Discussed providing SBA + set up for sponge bathing & dressing tasks. He is aware to provide her w/ close SBA if she is maneuvering thru kitchen for simple meal prep tasks. Educated on completing ROM from hand to shld (wrist is immobilized in splint). Pt to be d/c with husband tomorrow.     Safety Device - Type of devices:  []   All fall risk precautions in place []  Bed alarm in place  []   Call light within reach []  Chair alarm in place []  Positioning belt [x]  Gait belt []  Patient at risk for falls []  Left in bed []  Left in chair []  Telesitter in use []  Sitter present []  Nurse notified []   None      Therapy Time   Individual Co-treatment   Time In 1345    Time Out 1430    Minutes 45      Electronically signed by Viona Gilmore, OTR/L [phone] on 11/29/2017 at 2:29 PM

## 2017-11-29 NOTE — Progress Notes (Signed)
Department of Physical Medicine & Rehabilitation  Dr. Wyatt Haste Progress Note    Patient Identification:  Linda [name]  [id number]  DOB: 28-Jun-1946  Admit date: 11/24/2017      Diagnosis:   Patient Active Problem List   Diagnosis   ??? Hyperlipidemia   ??? Senile osteoporosis   ??? Menopausal and postmenopausal disorder   ??? GERD (gastroesophageal reflux disease)   ??? Migraine headache   ??? Colon polyps   ??? Depression   ??? Intractable headache   ??? Hypoxemia   ??? Osteoporosis   ??? Intractable chronic migraine without aura   ??? Medication overuse headache   ??? Iron deficiency anemia secondary to blood loss (chronic)   ??? Other intestinal malabsorption   ??? Graves disease   ??? Closed intertrochanteric fracture of hip, right, initial encounter ([place])   ??? Closed fracture of right wrist   ??? Closed fracture of right distal radius   ??? Closed fracture of intertrochanteric section of femur, right, sequela           Subjective: Pt seen this AM. Patient has made big improvements in her therapies over this past week, and her pain is much less in her right hip area. We think patient will be ready for discharge to??home tomorrow with home therapies and equipment. Patient will need a rolling platform walker and transfer tub bench at home at discharge, and these have been ordered for her.. Will fill out her COC and meds today for her discharge Saturday. We'll send her home with Percocet for pain control, and aspirin for the next 2 weeks to thin her blood.  She'll follow-up with Dr. Bridgett Larsson or Barnet Pall, and her family doctor, Dr. Garen Grams. Patient discussed with Dr. Derwood Kaplan today      BP 123/78    Pulse 63    Temp 98 ??F (36.7 ??C) (Oral)    Resp 16    Ht 5\' 4"  (1.626 m)    Wt 141 lb 1.5 oz (64 kg)    SpO2 95%    BMI 24.22 kg/m??     Last 24 hour lab  No results found for this or any previous visit (from the past 24 hour(s)).    Therapy progress:  PT  Position Activity Restriction  Other position/activity restrictions: RLE WBAT, NWBing RUE  Objective     Sit to  Stand: Stand by assistance  Stand to sit: Stand by assistance  Bed to Chair: Stand by assistance  Device: Platform Walker right  Assistance: Stand by assistance  Distance: 220'x2 and short distances in therapy gym  OT  PT Equipment Recommendations  Equipment Needed: Yes  Mobility Devices: Lawyer: Platform Right, Financial planner - Technique: Ambulating  Equipment Used: Tree surgeon Transfers Comments: used platform RW to enter/exit bathrm, good recall of safe hand placement & not to use R UE, GB used on L hand only                  ??  Assessment and Plan: Discharge to home tomorrow with home therapies.  ??  right intertrochanteric hip fracture- repaired 4/19 with IM nailing by Dr. Bridgett Larsson, weightbearing as tolerated right leg  ??  Right wrist fracture- repaired 4/19 with ORIF by Dr. Barnet Pall, no weightbearing right wrist.   ??  Depression -Paxil  ??  GERD-protonix  ??  Graves disease  ??  osteoporosis ??- Os-Cal  ??  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   ??  Bowels: Schedule Miralax +  Senna S. Follow bowel movements. Enema or suppository if needed.   ??  Bladder: Check PVR x 3.  [place] if PVR > 258ml or if any volume is > 500 ml.   ??  Pain: Percocet is ordered prn.   ??    Barth Kirks, MD, 11/29/2017, 7:32 AM

## 2017-11-29 NOTE — Progress Notes (Signed)
Physical Therapy  Discharge Summary    Name:Linda [name]  IEP:[id number]  DOB:1946-03-27     Diagnosis: R Inter trochanteric femur fx, distal radial fx, s/p gamma nail , ORIF R radius with C arm    Restrictions/Precautions  Restrictions/Precautions: Fall Risk           Position Activity Restriction  Other position/activity restrictions: RLE WBAT, NWBing RUE     Goals:                  Short term goals  Time Frame for Short term goals: 7 days  Short term goal 1: bed mobility at SBA- met 4/23  Short term goal 2: transfers at stand by assist to platform walker- met 4/23  Short term goal 3: ambulation with R platform wheeled walker 150' with SBA- met 4/24  Short term goal 4: ascend/descend curb step with min/CGA- met 4/23            Long term goals  Time Frame for Long term goals : 10 days  Long term goal 1: bed mobility at MI- met 4/26  Long term goal 2: transfers at MI with platform walker-met 4/26  Long term goal 3: ambulation with R platform wheeled walker 150' with MI- met 4/26  Long term goal 4: ascend/descend curb step with CGA- met 4/26  Long term goal 5: ascend /descend 4 steps with CGA and assist of another- met 4/26    Pt. Met 4/4 short term goals and 5/5 long term goals.   Assessment: Patient is a 72 y/o female with S/P gamma nail of R femur and ORIF R radius with c-arm on 11/22/17. Patient s/p fall on stairs and was independent with no device prior to fall. Patient has met 4/4 and 5/5 LTG.  Patient okay for room independence.  Pt able to ambulate 57' x2 with R platform walker with MI including transfers with intermittent cues for safety and hand placement.  Patient performed curb step with R platform wheeled walker with firm CGA, 4 steps with rail and CGA of 1. Patient independent with bed mobility. Pt's husband to come in this PM for family education. Patient limited by pain, weakness and decreased endurance. patient will benefit from ARU to improve overall independence with mobility.  Plan for D/C  Saturday, 4/27, with home PT.    Pt. Currently ambulates 440 feet with R platform walker and modified independence  Up/down 4 steps with rail, with contact guard assistance  Up/down curb step with R platform walker with contact guard assistance  Sit to/from stand with modified independence  Bed mobility with independence  Recommend Home PT in order to optimize independence and safety with assistive device.  Pt. Safe to return home with assistance from husband.   Provided pt. with written home exercise program.  Electronically signed by Bradd Burner on 11/29/2017 at 9:35 AM   Therapist was present, directed the patient's care, made skilled judgement, and was responsible for assessment and treatment of the patient.      Electronically signed by Donneta Romberg PT [phone] on 11/29/2017 at 12:40 PM

## 2017-11-29 NOTE — Plan of Care (Signed)
Problem: Risk for Impaired Skin Integrity  Goal: Tissue integrity - skin and mucous membranes  Description  Structural intactness and normal physiological function of skin and  mucous membranes.  11/29/2017 0033 by Enis Gash, RN  Outcome: Ongoing  11/28/2017 1519 by Manuella Ghazi, RN  Outcome: Ongoing  Note:   Pt assessed for skin break down. Skin was warm and dry to touch. Pt able to turn self and regulate head of bed with assistance. Pt reminded to turn or reposition at least every 2 hours to prevent skin breakdown. Will continue to monitor and assess.       Problem: IP BLADDER/VOIDING  Goal: LTG - patient will complete bladder elimination  11/29/2017 0033 by Enis Gash, RN  Outcome: Ongoing  11/28/2017 1519 by Manuella Ghazi, RN  Outcome: Ongoing  Goal: LTG - patient will achieve acceptable level of continence  11/29/2017 0033 by Enis Gash, RN  Outcome: Ongoing  11/28/2017 1519 by Manuella Ghazi, RN  Outcome: Ongoing  Note:   Pt is continent of urine.   Goal: STG - patient will be able to empty bladder  11/29/2017 0033 by Enis Gash, RN  Outcome: Ongoing  11/28/2017 1519 by Manuella Ghazi, RN  Outcome: Ongoing  Note:   Pt is able to empty bladder.      Problem: NUTRITION  Goal: Patient maintains adequate hydration  11/29/2017 0033 by Enis Gash, RN  Outcome: Ongoing  11/28/2017 1519 by Manuella Ghazi, RN  Outcome: Ongoing  Goal: Patient maintains weight  11/29/2017 0033 by Enis Gash, RN  Outcome: Ongoing  11/28/2017 1519 by Manuella Ghazi, RN  Outcome: Ongoing  Goal: Patient/Family demonstrates understanding of diet  11/29/2017 0033 by Enis Gash, RN  Outcome: Ongoing  11/28/2017 1519 by Manuella Ghazi, RN  Outcome: Ongoing  Goal: Patient/Family independently completes tube feeding  11/28/2017 1519 by Manuella Ghazi, RN  Outcome: Ongoing  Goal: Patient will have no more than 5 lb weight change during LOS  11/28/2017 1519 by Manuella Ghazi, RN  Outcome:  Ongoing  Goal: Patient will utilize adaptive techniques to administer nutrition  11/28/2017 1519 by Manuella Ghazi, RN  Outcome: Ongoing  Goal: Patient will verbalize dietary restrictions  11/28/2017 1519 by Manuella Ghazi, RN  Outcome: Ongoing     Problem: SAFETY  Goal: LTG - patient will adhere to hip precautions during ADL's and transfers  11/29/2017 0033 by Enis Gash, RN  Outcome: Ongoing  11/28/2017 1519 by Manuella Ghazi, RN  Outcome: Ongoing  Goal: LTG - Patient will demonstrate safety requirements appropriate to situation/environment  11/29/2017 0033 by Enis Gash, RN  Outcome: Ongoing  11/28/2017 1519 by Manuella Ghazi, RN  Outcome: Ongoing  Goal: LTG - patient will utilize safety techniques  11/29/2017 0033 by Enis Gash, RN  Outcome: Ongoing  11/28/2017 1519 by Manuella Ghazi, RN  Outcome: Ongoing  Goal: STG - patient locks brakes on wheelchair  11/28/2017 1519 by Manuella Ghazi, RN  Outcome: Ongoing  Goal: STG - Patient uses call light consistently to request assistance with transfers  11/28/2017 1519 by Manuella Ghazi, RN  Outcome: Ongoing  Note:   Pt uses call light appropriately.   Goal: STG - patient uses gait belt during all transfers  11/28/2017 1519 by Manuella Ghazi, RN  Outcome: Ongoing     Problem: SKIN INTEGRITY  Goal: LTG - Patient will be free from infection  11/28/2017 1519 by Manuella Ghazi,  RN  Outcome: Ongoing  Note:   Pt is free of signs and symptoms of infection. Incision and dressing are clean, dry and intact. Vital signs stable. Will monitor.    Goal: LTG - patient will maintain/improve skin integrity through proper skin care techniques  11/28/2017 1519 by Manuella Ghazi, RN  Outcome: Ongoing  Goal: LTG - Patient will demonstrate appropriate pressure relief techniques  11/28/2017 1519 by Manuella Ghazi, RN  Outcome: Ongoing  Goal: LTG - patient will demonstrate appropriate skin care techniques  11/28/2017 1519 by Manuella Ghazi,  RN  Outcome: Ongoing  Goal: LTG - Patient will be free from infection  11/28/2017 1519 by Manuella Ghazi, RN  Outcome: Ongoing  Goal: STG - Patient demonstrates skin care/treatment/dressing change  11/28/2017 1519 by Manuella Ghazi, RN  Outcome: Ongoing  Goal: STG - patient will maintain good skin integrity  11/28/2017 1519 by Manuella Ghazi, RN  Outcome: Ongoing  Goal: STG - Patient exhibits signs of wound healing.  11/28/2017 1519 by Manuella Ghazi, RN  Outcome: Ongoing  Goal: STG - patient demonstrates pressure reduction techniques  11/28/2017 1519 by Manuella Ghazi, RN  Outcome: Ongoing  Goal: STG - Patient demonstrates preventative skin care measures  11/28/2017 1519 by Manuella Ghazi, RN  Outcome: Ongoing     Problem: PAIN  Goal: LTG - Patient will manage pain with the appropriate technique/Intervention  11/29/2017 0033 by Enis Gash, RN  Outcome: Ongoing  11/28/2017 1519 by Manuella Ghazi, RN  Outcome: Ongoing  Goal: LTG - Patient will demonstrate intervention for managing pain  11/29/2017 0033 by Enis Gash, RN  Outcome: Ongoing  11/28/2017 1519 by Manuella Ghazi, RN  Outcome: Ongoing  Goal: STG - Patient will reduce or eliminate use of analgesics  11/29/2017 0033 by Enis Gash, RN  Outcome: Ongoing  11/28/2017 1519 by Manuella Ghazi, RN  Outcome: Ongoing  Goal: STG - pain is manageable through therapies  11/29/2017 0033 by Enis Gash, RN  Outcome: Ongoing  11/28/2017 1519 by Manuella Ghazi, RN  Outcome: Ongoing  Goal: STG - Patient will verbalize an acceptable level of pain  11/29/2017 0033 by Enis Gash, RN  Outcome: Ongoing  11/28/2017 1519 by Manuella Ghazi, RN  Outcome: Ongoing  Goal: STG - patients pain is managed to allow active participation in daily activities  11/28/2017 1519 by Manuella Ghazi, RN  Outcome: Ongoing  Goal: STG - Patient will increase activity level  11/28/2017 1519 by Manuella Ghazi, RN  Outcome: Ongoing  Goal: STG - patient  verbalizes a reduction in pain level  11/28/2017 1519 by Manuella Ghazi, RN  Outcome: Ongoing     Problem: Daily Care:  Goal: Daily care needs are met  Description  Daily care needs are met  11/28/2017 1519 by Manuella Ghazi, RN  Outcome: Ongoing     Problem: Discharge Planning:  Goal: Patients continuum of care needs are met  Description  Patients continuum of care needs are met  11/28/2017 1519 by Manuella Ghazi, RN  Outcome: Ongoing     Problem: Infection - Surgical Site:  Goal: Will show no infection signs and symptoms  Description  Will show no infection signs and symptoms  11/29/2017 0033 by Enis Gash, RN  Outcome: Ongoing  11/28/2017 1519 by Manuella Ghazi, RN  Outcome: Ongoing     Problem: Mobility - Impaired:  Goal: Mobility will improve to maximum level  Description  Mobility will improve to maximum level  11/29/2017 0033 by Enis Gash, RN  Outcome: Ongoing  11/28/2017 1519 by Manuella Ghazi, RN  Outcome: Ongoing     Problem: Tobacco Use:  Goal: Inpatient tobacco use cessation counseling participation  Description  Inpatient tobacco use cessation counseling participation  11/28/2017 1519 by Manuella Ghazi, RN  Outcome: Ongoing     Problem: Pain:  Goal: Pain level will decrease  Description  Pain level will decrease  11/28/2017 1519 by Manuella Ghazi, RN  Outcome: Ongoing  Note:   Patient able to express pain and rate pain using pain scale. Medicate as needed per orders. Reassess patient pain level within one hour after oral pain medication/intervention to assure patient has reduced pain sensation and document outcome. Non pharmaceutical interventions as appropriate.   Goal: Control of acute pain  Description  Control of acute pain  11/28/2017 1519 by Manuella Ghazi, RN  Outcome: Ongoing  Goal: Control of chronic pain  Description  Control of chronic pain  11/28/2017 1519 by Manuella Ghazi, RN  Outcome: Ongoing

## 2017-11-29 NOTE — Progress Notes (Signed)
[facility] Note      Chief Complaint: I am going home tomorrow    Subjective:    Patient is sitting up in the chair today.  She looks good.  She states she is feeling well.  She is going home tomorrow and looking forward to this.  No new problems noted.    No chest pain or shortness breath. No cough or sputum. No nausea, vomiting, diarrhea. No abdominal pain. No dysuria.  The remainder of the review of systems is negative.     PMH, PSH, FH/SH reviewed and unchanged as documented in the H&P personally documented at admission 11/21/17    Medication list reviewed    Objective:    BP 123/78    Pulse 63    Temp 98 ??F (36.7 ??C) (Oral)    Resp 16    Ht 5\' 4"  (1.626 m)    Wt 141 lb 1.5 oz (64 kg)    SpO2 95%    BMI 24.22 kg/m??   Temp  Avg: 98.2 ??F (36.8 ??C)  Min: 98 ??F (36.7 ??C)  Max: 98.3 ??F (36.8 ??C)    RRR  Chest-chest clear throughout, respirations easy  Abd-sounds positive, soft, nontender, nondistended  Ext- no edema, right wrist in splint.    The Following Labs Were Reviewed Today:    No results found for this or any previous visit (from the past 24 hour(s)).    ASSESSMENT/PLAN:      Principal Problem:    Closed intertrochanteric fracture of hip, right/Closed fracture of right wrist- continue rehabilitation. Home tomorrow. Doing well  Active Problems:    Iron deficiency anemia secondary to blood loss (chronic)-CBC has been stable. Continue iron supplementation.    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  May need to see osteoporosis specialist for treatment considerations.    GERD (gastroesophageal reflux disease)-stable    Graves disease- Tapazole stopped.  Monitor thyroid function as an outpatient    Hypokalemia-potassium okay. Continue to monitor, follow as an outpatient      Okay for discharge tomorrow. Patient should follow up in our office in the next 2-3 weeks.    Deontae Robson Tomasita Crumble, MD, FACP  7:55 AM  11/29/2017

## 2017-11-29 NOTE — Progress Notes (Signed)
Occupational Therapy  Facility/Department: [facility]  Daily Treatment Note  NAME: Linda [name]  DOB: 06/08/1946  MRN: [id number]    Date of Service: 11/29/2017    Discharge Recommendations:  Home with assist PRN, S Level 3    HOME HEALTH CARE: LEVEL 3 SAFETY    -Initial home health evaluation to occur within 24-48 hours, in patient home    -Home health agency to establish plan of care for patient over 60 day period    -Medication Reconciliation    -PT/OT/Speech evaluations in home within 24-48 hours of discharge; including-DME and home safety    -Frontload therapy 5 days, then 3x a week    -OT to evaluate if patient has [place] needs for personal care    -Social Worker evaluation within 24-48 hours, includes evaluation of resources  and insurance to determine [place], [place], LTC, and Medicaid options    -PCP Visit scheduled within three to seven days of discharge         OT Equipment Recommendations  Walker: Music therapist  ADL Assistive Devices: Transfer Tub Bench;Long-handled Sponge;Sock-Aid Manufacturing engineer;Toilet Safety Frame;Long-handled Conservation officer, historic buildings  Other: Will order transfer bench from [employer].  Wants to get toilet safety frame from [facility]    Assessment   Performance deficits / Impairments: Decreased functional mobility ;Decreased strength;Decreased endurance;Decreased ADL status;Decreased balance;Decreased high-level IADLs  Assessment: Pt tolerated session well and able to complete dressing and bathing at SBA. Pt still requiring CGA for functional mobility/ transfers d/t occ LOB. Pt to be d/c with husband tomorrow. Cont OT Tx POC.  Treatment Diagnosis: impaired func mob, transfers, and ADL status   Patient Education: ADL retraining, transfer safety, AE   REQUIRES OT FOLLOW UP: Yes  Activity Tolerance: Patient Tolerated treatment well  Safety Devices in place: Yes  Type of devices: Gait belt;Chair alarm in place;Call light within reach;Left in chair         Patient  Diagnosis(es): The encounter diagnosis was Closed fracture of intertrochanteric section of femur, right, sequela.      has a past medical history of Bisphosphonate-associated osteonecrosis of the jaw [facility]), Celiac artery atherosclerosis, Colon polyps, Dyspepsia, Graves disease, High cholesterol, History of depression, Hypertension, Iron deficiency anemia secondary to blood loss (chronic), Mass of parotid gland, Migraine, Osteoarthritis of lumbar spine, Other intestinal malabsorption, Postmenopausal, and Thoracic compression fracture ([place]).   has a past surgical history that includes shoulder surgery; Artery Biopsy (Right, 12-24-12); Upper gastrointestinal endoscopy (08/26/2014); Colonoscopy ([id number]); Colonoscopy (N/A, 07/09/2017); Colonoscopy (N/A, 07/09/2017); Upper gastrointestinal endoscopy (N/A, 08/08/2017); esophageal motility study (N/A, 08/27/2017); Fixation Kyphoplasty; Wrist fracture surgery (Right, 11/22/2017); hip surgery (Right, 11/22/2017); Hip fracture surgery (Right, 11/22/2017); and ORIF DISTAL RADIUS FRACTURE (Right, 11/22/2017).    Restrictions  Restrictions/Precautions: Fall Risk  Other position/activity restrictions: RLE WBAT, NWBing RUE    Subjective   Chart Reviewed: Yes  Patient assessed for rehabilitation services?: Yes  Additional Pertinent Hx: Pt is 72 y.o. F who presents s/p mechanical fall down steps at home resulting in R intertrochanteric femur fracture and R distal radius fracture. Pt is WBAT on RLE and NWBing on RUE. PMH: Iron deficiency anemia, thoracic compression fx, depression  Family / Caregiver Present: No  Referring Practitioner: Dr. Wyatt Haste  Diagnosis: nondisplaced intertrochanteric fx of R femur  Subjective: Pt met in bed, agreeable to tx, declines shower, wants to sponge bathe only this date, Pt c/o 3/10 pain in R LE.    Orientation  Overall Orientation Status: Within Normal Limits  Objective    ADL  Grooming: Supervision(washed face and hands sitting in w/c at sink, stood at sink to  brush teeth, shower cap applied and pt combed hair, pt attempted to rinse mouth with soap instead of mouth wash and required cueing to correct)  UE Bathing: Supervision(sat in w/c @ sink, applied glove to R UE to prevent cast/ wrap from getting wet)  LE Bathing: Stand by assistance(use of long handled sponge to wash/dry feet, stood to sink to wash/ dry posterior)  UE Dressing: Supervision(pt seated in w/c to don shirt)  LE Dressing: Stand by assistance(stood to sink to pull pants up)    Balance  Sitting Balance: Supervision  Standing Balance: Contact guard assistance  Standing Balance  Time: 2 minutes   Activity: brushing teeth   Comment: used sink for support     Functional Mobility  Functional - Mobility Device: Platform walker  Activity: To/from bathroom  Assist Level: Contact guard assistance  Functional Mobility Comments: Pt used platform RW from bed to closet for clothing retrievel <> bathroom @ SBA and experienced 1 LOB. Pt required CGA to correct., cues on how to transport clothing     Wheelchair Bed Transfers  Wheelchair/Bed - Technique: Ambulating  Equipment Used: Bed;Wheelchair  Level of Asssistance: Stand by Management consultant Transfers Comments: Pt completed transfer from bed to w/c at SBA with use of platform RW.     Bed mobility  Supine to Sit: Supervision  Scooting: Supervision    Cognition: decreased short term memory, decreased safety awareness, and decreased insight into deficits      Plan   Times per week: 5-6x  Times per day: Twice a day  Plan weeks: DC Sat w/ husband + HH services  Specific instructions for Next Treatment: kitchen mobility  Current Treatment Recommendations: Strengthening, Therapist, nutritional, Engineer, production, Hotel manager, Proofreader, Barrister's clerk, Education, Visual merchandiser, Child psychotherapist, Research officer, political party / ADL, Home Management Training  Plan Comment: WED conf    Goals  Short term goals  Time Frame for Short term  goals: 4 days  Short term goal 1: Pt will complete functional mobility and transfers with SBA and use of AD  Short term goal 2: Pt will complete grooming in stance at sink with SBA MET   Short term goal 3: Pt will complete dressing and bathing SBA and use of AD as needed MET   Short term goal 4: Pt will complete kitchen mobility with SBA  Short term goal 5: Pt will complete toileting with SBA  Long term goals  Time Frame for Long term goals : 7 days  Long term goal 1: Pt will complete functional mobility and transfers with Mod Ind   Long term goal 2: Pt will complete grooming in stance at sink Mod Ind Not met   Long term goal 3: Pt will complete dressing and bathing Mod Ind  Not met   Long term goal 4: Pt will complete toileting Mod Ind  Long term goal 5: Pt will complete simple meal prep Mod Ind  Patient Goals   Patient goals : to return home       Therapy Time   Individual Concurrent Group Co-treatment   Time In 0700         Time Out 0750         Minutes 50         Timed Code Treatment Minutes: [place],  S/OTA    Therapist was present, directed patients care, made skilled judgement, and was responsible for assessment and treatment of the patient.    Alphonzo Dublin, [place]  Dyann Ruddle, OTR/L [phone]

## 2017-11-29 NOTE — Progress Notes (Addendum)
Physical Therapy  Facility/Department: [facility]  Daily Treatment Note  NAME: Linda [name]  DOB: 1946/02/27  MRN: [id number]    Date of Service: 11/29/2017    Discharge Recommendations:  Home with assist PRN, Home with Home health PT, S Level 1, Patient would benefit from continued therapy after discharge   PT Equipment Recommendations  Equipment Needed: Yes  Walker: Platform Right;Rolling    Patient Diagnosis(es): The encounter diagnosis was Closed fracture of intertrochanteric section of femur, right, sequela.     has a past medical history of Bisphosphonate-associated osteonecrosis of the jaw [facility]), Celiac artery atherosclerosis, Colon polyps, Dyspepsia, Graves disease, High cholesterol, History of depression, Hypertension, Iron deficiency anemia secondary to blood loss (chronic), Mass of parotid gland, Migraine, Osteoarthritis of lumbar spine, Other intestinal malabsorption, Postmenopausal, and Thoracic compression fracture ([facility]).   has a past surgical history that includes shoulder surgery; Artery Biopsy (Right, 12-24-12); Upper gastrointestinal endoscopy (08/26/2014); Colonoscopy ([id number]); Colonoscopy (N/A, 07/09/2017); Colonoscopy (N/A, 07/09/2017); Upper gastrointestinal endoscopy (N/A, 08/08/2017); esophageal motility study (N/A, 08/27/2017); Fixation Kyphoplasty; Wrist fracture surgery (Right, 11/22/2017); hip surgery (Right, 11/22/2017); Hip fracture surgery (Right, 11/22/2017); and ORIF DISTAL RADIUS FRACTURE (Right, 11/22/2017).    Restrictions  Restrictions/Precautions  Restrictions/Precautions: Fall Risk  Position Activity Restriction  Other position/activity restrictions: RLE WBAT, NWBing RUE  Subjective   General  Chart Reviewed: Yes  Additional Pertinent Hx: fall on stairs resulted in an intertrochanteric femur fx R and distal radial fx. patient had surgery 4/19 with s/p gamma nail and ORIF R radius with C- arm. Patient is WBAT R LE. PMH -Grave's disease, HTN, migraines, S/P LB surgery, L  humeral fx with surgery  Response To Previous Treatment: Patient with no complaints from previous session.  Family / Caregiver Present: No  Subjective  Subjective: Patient descibe pain 3/10 on R lateral femur. Patient agreeable to therapy. Feels ready for D/C.          Orientation  Orientation  Overall Orientation Status: Within Functional Limits  Cognition      Objective   Bed mobility  Bridging: Independent  Rolling to Left: Independent  Rolling to Right: Independent  Supine to Sit: Independent  Sit to Supine: Independent  Scooting: Independent  Comment: aware of log rolling  Transfers  Sit to Stand: Modified independent  Stand to sit: Modified independent  Bed to Chair: Modified independent  Ambulation  Ambulation?: Yes  More Ambulation?: No  Ambulation 1  Surface: level tile  Device: Platform Walker right  Assistance: Modified Independent  Quality of Gait: reciprocal gait pattern, slightly antalgic gait, cadence improving, no LOB  Distance: 420'  Comments: quicker pace  Stairs/Curb  Stairs?: Yes  Stairs  # Steps : 4  Stairs Height: 6"  Rails: Left ascending(L rail descend, CGA of 1)  Curbs: 6"(one curb step)  Device: Rolling walker(with R platform)  Assistance: Stand by assistance;Contact guard assistance  Comment: Patient able to lift R platform wheeled walker with cues for technique, 4 steps with rail         Exercises  Hip Flexion: 15  Knee Long Arc Quad: x15 alternating   Ankle Pumps: x20 heel raises/toe raises  Comments: ther ex seated in w/c       Second Session-Patient present with husband  S/ Patient reports 3/10 with R LE, no c/o.  O/Practiced curb step with  Platform wheeled walker on R side with SBA/ CGA., patient able to cue husband with sequencing.  Sit to supine MI  Reviewed HEP ,  gave written HEP- GS,QS, adductor sets, HS, hip abduction, SAQ x 20, AP and SLR x 5 with assist  Patient sit to stand MI  Patient ambulated to room with R platform wheeled walker with MI 150', patient 's platform wheeled  walker fitted appropriately  Patient doing well with room independence.        Assessment   Body structures, Functions, Activity limitations: Decreased functional mobility ;Decreased strength;Decreased ADL status;Decreased balance;Decreased endurance;Decreased ROM;Increased Pain  Assessment: Patient is a 72 y/o female with S/P gamma nail of R femur and ORIF R radius with c-arm on 11/22/17. Patient s/p fall on stairs and was independent with no device prior to fall. Patient has met 4/4 and 5/5 LTG.  Patient okay for room independence.  Pt able to ambulate 51' x2 with R platform walker with MI including transfers with intermittent cues for safety and hand placement.  Patient performed curb step with R platform wheeled walker with firm CGA, 4 steps with rail and CGA of 1. Patient independent with bed mobility. Pt's husband to come in this PM for family education. Patient limited by pain, weakness and decreased endurance. patient will benefit from ARU to improve overall independence with mobility.  Plan for D/C Saturday, 4/27, with home PT.  Prognosis: Good  Patient Education: safety with transfers and gait, curb, NWB RUE, steps, HEP  REQUIRES PT FOLLOW UP: Yes  Activity Tolerance  Activity Tolerance: Patient Tolerated treatment well;Patient limited by pain       Goals  Short term goals  Time Frame for Short term goals: 7 days  Short term goal 1: bed mobility at SBA- met 4/23  Short term goal 2: transfers at stand by assist to platform walker- met 4/23  Short term goal 3: ambulation with R platform wheeled walker 150' with SBA- met 4/24  Short term goal 4: ascend/descend curb step with min/CGA- met 4/23  Long term goals  Time Frame for Long term goals : 10 days  Long term goal 1: bed mobility at MI- met 4/26  Long term goal 2: transfers at MI with platform walker-met 4/26  Long term goal 3: ambulation with R platform wheeled walker 150' with MI- met 4/26  Long term goal 4: ascend/descend curb step with CGA- met  4/26  Long term goal 5: ascend /descend 4 steps with CGA and assist of another- met 4/26  Patient Goals   Patient goals : to go home and be able to help huband with cooking ect    Plan    Plan  Times per week: 5-6x   Times per day: Twice a day  Plan weeks: 7-10 days  Current Treatment Recommendations: Strengthening, ROM, Location manager, Building services engineer, Teacher, early years/pre, Archivist, Teaching laboratory technician, Stair training, Pain Management, Home Exercise Program, Equities trader, Glass blower/designer, Investment banker, operational, Patent attorney, Mining engineer, Education, & procurement  Safety Devices  Type of devices: All fall risk precautions in place, Gait belt  Restraints  Initially in place: No     Therapy Time   Individual Concurrent Group Co-treatment   Time In 0815         Time Out 0900         Minutes 45         Timed Code Treatment Minutes: 45 Minutes    Second Session Therapy Time     Individual Co-treatment   Time In 1430    Time Out 1515    Minutes 45  Donneta Romberg, PT # [phone]

## 2017-11-29 NOTE — Care Coordination-Inpatient (Signed)
[facility] rep delivered requested 2-Wheeled Walker with Right Platform Attachment to patient and reviewed insurance coverage and equipment set up with patient.    Patient stated she ordered her TSF & TTB through [employer].    Thank you for the referral.  Electronically signed by Blanche East on 11/29/2017 at 12:32 PM Cell ph# [phone]

## 2017-11-29 NOTE — Plan of Care (Signed)
Problem: Risk for Impaired Skin Integrity  Goal: Tissue integrity - skin and mucous membranes  Description  Structural intactness and normal physiological function of skin and  mucous membranes.  11/29/2017 1333 by Glenetta Borg, RN  Outcome: Ongoing  Note:   Monitor no issues noted, no breakdown noted on arm  11/29/2017 0033 by Shela Nevin, RN  Outcome: Ongoing     Problem: IP BLADDER/VOIDING  Goal: LTG - patient will complete bladder elimination  11/29/2017 1333 by Glenetta Borg, RN  Outcome: Ongoing  Note:   Has room I, no issues note  11/29/2017 0033 by Shela Nevin, RN  Outcome: Ongoing  Goal: LTG - patient will achieve acceptable level of continence  11/29/2017 0033 by Shela Nevin, RN  Outcome: Ongoing  Goal: STG - patient will be able to empty bladder  11/29/2017 0033 by Shela Nevin, RN  Outcome: Ongoing     Problem: NUTRITION  Goal: Patient maintains adequate hydration  11/29/2017 1333 by Glenetta Borg, RN  Outcome: Ongoing  Note:   States does not eat breakfast, cues for intake.  11/29/2017 0033 by Shela Nevin, RN  Outcome: Ongoing  Goal: Patient maintains weight  11/29/2017 0033 by Shela Nevin, RN  Outcome: Ongoing  Goal: Patient/Family demonstrates understanding of diet  11/29/2017 0033 by Shela Nevin, RN  Outcome: Ongoing     Problem: SAFETY  Goal: LTG - patient will adhere to hip precautions during ADL's and transfers  11/29/2017 1333 by Glenetta Borg, RN  Outcome: Ongoing  Note:   Doing well with transfers, has room I.  11/29/2017 0033 by Shela Nevin, RN  Outcome: Ongoing  Goal: LTG - Patient will demonstrate safety requirements appropriate to situation/environment  11/29/2017 0033 by Shela Nevin, RN  Outcome: Ongoing  Goal: LTG - patient will utilize safety techniques  11/29/2017 0033 by Shela Nevin, RN  Outcome: Ongoing     Problem: SKIN INTEGRITY  Goal: LTG - Patient will be free from infection  Outcome: Ongoing  Note:   No issues noted, monitor.     Problem: PAIN  Goal:  LTG - Patient will manage pain with the appropriate technique/Intervention  11/29/2017 1333 by Glenetta Borg, RN  Outcome: Ongoing  Note:   On Oxycontin, reviewed pain control and pt uses ice and rest with relief.  11/29/2017 0033 by Shela Nevin, RN  Outcome: Ongoing  Goal: LTG - Patient will demonstrate intervention for managing pain  11/29/2017 0033 by Shela Nevin, RN  Outcome: Ongoing  Goal: STG - Patient will reduce or eliminate use of analgesics  11/29/2017 0033 by Shela Nevin, RN  Outcome: Ongoing  Goal: STG - pain is manageable through therapies  11/29/2017 0033 by Shela Nevin, RN  Outcome: Ongoing  Goal: STG - Patient will verbalize an acceptable level of pain  11/29/2017 0033 by Shela Nevin, RN  Outcome: Ongoing     Problem: Daily Care:  Goal: Daily care needs are met  Description  Daily care needs are met  Outcome: Ongoing  Note:   Doing well with self care, monitor     Problem: Discharge Planning:  Goal: Patients continuum of care needs are met  Description  Patients continuum of care needs are met  Outcome: Ongoing  Note:   Discharge planned for Saturday     Problem: Infection - Surgical Site:  Goal: Will show no infection signs and symptoms  Description  Will show no infection signs and symptoms  11/29/2017 0033 by Shela Nevin, RN  Outcome: Ongoing     Problem: Mobility - Impaired:  Goal: Mobility will improve to maximum level  Description  Mobility will improve to maximum level  11/29/2017 1333 by Glenetta Borg, RN  Outcome: Ongoing  Note:   Room I with platform walker.  11/29/2017 0033 by Shela Nevin, RN  Outcome: Ongoing     Problem: Tobacco Use:  Goal: Inpatient tobacco use cessation counseling participation  Description  Inpatient tobacco use cessation counseling participation  Outcome: Ongoing  Note:   Reviewed issues with second hand smoke.     Problem: Pain:  Goal: Pain level will decrease  Description  Pain level will decrease  Outcome: Ongoing  Note:   States pain under control,  uses ice and rest with relief

## 2017-11-29 NOTE — Discharge Summary (Signed)
Occupational Therapy  Discharge Summary     Name:Linda [name]  YSA:[id number]  DOB:Nov 30, 1945     Diagnosis: R Inter trochanteric femur fx, distal radial fx, s/p gamma nail , ORIF R radius with C arm    Restrictions/Precautions  Restrictions/Precautions: Fall Risk           Position Activity Restriction  Other position/activity restrictions: RLE WBAT, NWBing RUE     Goals:   Short term goals  Time Frame for Short term goals: 4 days  Short term goal 1: Pt will complete functional mobility and transfers with SBA and use of AD  Short term goal 2: Pt will complete grooming in stance at sink with SBA MET   Short term goal 3: Pt will complete dressing and bathing SBA and use of AD as needed MET   Short term goal 4: Pt will complete kitchen mobility with SBA--goal met  Short term goal 5: Pt will complete toileting with SBA--goal met   Long term goals  Time Frame for Long term goals : 7 days  Long term goal 1: Pt will complete functional mobility and transfers with Mod Ind --goal met, safety concerns  Long term goal 2: Pt will complete grooming in stance at sink Mod Ind Not met   Long term goal 3: Pt will complete dressing and bathing Mod Ind  Not met   Long term goal 4: Pt will complete toileting Mod Ind--goal met 11/28/17  Long term goal 5: Pt will complete simple meal prep Mod Ind    Pt. Met 4/5 short term goals and 2/5 long term goals.     Current Functional Status:   ADL  Feeding: Modified independent   Grooming: Supervision(washed face and hands sitting in w/c at sink, stood at sink to brush teeth, shower cap applied and pt combed hair, pt attempted to rinse mouth with soap instead of mouth wash and required cueing to correct)  UE Bathing: Supervision(sat in w/c @ sink, applied glove to R UE to prevent cast/ wrap from getting wet)  LE Bathing: Stand by assistance(use of long handled sponge to wash/dry feet, stood to sink to wash/ dry posterior)  UE Dressing: Supervision(pt seated in w/c to don shirt)  LE Dressing:  Stand by assistance(stood to sink to pull pants up)  Toileting: Modified independent (able to manage clothing, wipes, good recall not to use R UE)  Additional Comments: offered a shower & washing hair, willing to sponge bathe while seated on toilet & at sink--VCs to remain focused    Bed mobility  Bridging: Independent  Rolling to Left: Independent  Rolling to Right: Independent  Supine to Sit: Independent  Sit to Supine: Independent  Scooting: Independent  Comment: aware of log rolling    Functional Transfers:  Ecologist - Technique: Ambulating  Equipment Used: Doctor, hospital Comments: used platform RW to enter/exit bathrm, good recall of safe hand placement & not to use R UE,  used TSF on L hand only--husband instructed how to apply and adjust, no tools need    Tub Transfers  Tub - Transfer From: Rolling walker  Tub - Transfer Type: To and From  Tub - Transfer To: Transfer tub bench  Tub - Technique: Ambulating  Tub Transfers: Stand by assistance  Tub Transfers Comments: Pt & husband  instructed on dry tub transfer using TTB--OT steadied bench while she lifted her LEs in/out. Plans to purchase from [employer]  Nutritional therapist - Transfer From: [employer])  Shower - Transfer Type: To and From  Shower - Transfer To: Education officer, museum - Technique: Programmer, systems Transfers: Minimal assistance(Min A with RLE over side of tub)    English as a second language teacher - Transfer From: Theme park manager - Technique: Electrical engineer Transfers: Modified Transport planner Transfers: husband instructed how pt performs transfer--used cushion on seat, she was able to approach car using platform RW, good recall safe hand placement and talking herself thru each step; able to lift each LE in/out, able to exit car using handle w/ L hand      Functional Mobility  Functional - Mobility Device: Platform walker  Activity: To/from bathroom  Assist Level: Contact  guard assistance  Functional Mobility Comments: Pt used platform RW from bed to closet <> bathroom @ SBA and experienced 1 LOB. Pt required CGA to correct.     Instrumental ADL's  Instrumental ADLs: Yes  Meal Prep  Meal Prep Level: Walker  Meal Prep Level of Assistance: Stand by assistance  Meal Preparation: pt and husband instructed w/ placing frequently used items within her reach to get a drink or small food items--husband to provide SBA                             UE Function:   WFLs except R wrist which is immobilized in splint, NWB    Assessment:   Assessment: Pt tolerated session well and able to complete dressing and bathing at SBA. Pt still requiring CGA for functional mobility/ transfers d/t occ LOB. Pt to be d/c with husband tomorrow. Cont OT Tx POC.  Prognosis: Good     REQUIRES OT FOLLOW UP: Yes  Discharge Recommendations: Home with assist PRN, S Level 3    Equipment Recommendations:  TTB and TSF to be purchased from [employer]; received platform RW from [place]  Provided Pt with ROM exercises    Electronically signed by Viona Gilmore, OTR/L [phone] on 11/29/2017 at 2:52 PM

## 2017-11-30 MED FILL — OXYCONTIN 10 MG PO T12A: 10 mg | ORAL | Qty: 2

## 2017-11-30 MED FILL — PANTOPRAZOLE SODIUM 40 MG PO TBEC: 40 mg | ORAL | Qty: 1

## 2017-11-30 MED FILL — CALCIUM-VITAMIN D 500-200 MG-UNIT PO TABS: 500-200 MG-UNIT | ORAL | Qty: 1

## 2017-11-30 MED FILL — POTASSIUM CHLORIDE CRYS ER 20 MEQ PO TBCR: 20 meq | ORAL | Qty: 1

## 2017-11-30 MED FILL — LOVENOX 40 MG/0.4ML SC SOLN: 40 MG/0.4ML | SUBCUTANEOUS | Qty: 0.4

## 2017-11-30 MED FILL — PAROXETINE HCL 20 MG PO TABS: 20 mg | ORAL | Qty: 1

## 2017-11-30 MED FILL — DIVALPROEX SODIUM ER 500 MG PO TB24: 500 mg | ORAL | Qty: 1

## 2017-11-30 MED FILL — OXYCODONE-ACETAMINOPHEN 5-325 MG PO TABS: 5-325 mg | ORAL | Qty: 2

## 2017-11-30 MED FILL — FERREX 150 150 MG PO CAPS: 150 mg | ORAL | Qty: 1

## 2017-11-30 NOTE — Progress Notes (Signed)
Pt discharged to home. Transportation here with wheelchair. Accompanied by spouse. Transported in personal vehicle. Discharge instructions, Rx, and personal belongings given to pt. Explanation of discharge medications and instructions understood by verbal statement. No questions, comments or concerns at this time.

## 2017-11-30 NOTE — Progress Notes (Signed)
RN called Dr Robby Sermon regarding the Aspirin 325 mg and when exactly is the patient suppose to start this medication. MD clarified it is to be started on 4/28 when she leaves the hospital. RN will verify with patient that she is to start on 4/28. Patient is to stop the Lovenox and remain on the aspirin for the next two weeks.

## 2017-11-30 NOTE — Discharge Summary (Signed)
Department of Physical Medicine & Rehabilitation  Dr. Wyatt Haste Discharge Summary     Patient Identification:  Linda [name]  DOB: 1945/08/11  Admit date: 11/24/2017  Discharge date: 11/30/17   Attending provider: Barth Kirks, MD        Primary care provider: Angelica Pou, MD     Discharge Diagnoses:   Patient Active Problem List   Diagnosis   ??? Hyperlipidemia   ??? Senile osteoporosis   ??? Menopausal and postmenopausal disorder   ??? GERD (gastroesophageal reflux disease)   ??? Migraine headache   ??? Colon polyps   ??? Depression   ??? Intractable headache   ??? Hypoxemia   ??? Osteoporosis   ??? Intractable chronic migraine without aura   ??? Medication overuse headache   ??? Iron deficiency anemia secondary to blood loss (chronic)   ??? Other intestinal malabsorption   ??? Graves disease   ??? Closed intertrochanteric fracture of hip, right, initial encounter ([place])   ??? Closed fracture of right wrist   ??? Closed fracture of right distal radius   ??? Closed fracture of intertrochanteric section of femur, right, sequela         Discharge Functional Status:    Physical therapy:  Bed Mobility: Scooting: Independent  Transfers: Sit to Stand: Modified independent  Stand to sit: Modified independent  Bed to Chair: Modified independent, Ambulation 1  Surface: level tile  Device: Platform Walker right  Assistance: Modified Independent  Quality of Gait: reciprocal gait pattern, slightly antalgic gait, cadence improving, no LOB  Distance: 420'  Comments: quicker pace, Stairs  # Steps : 4  Stairs Height: 6"  Rails: Left ascending(L rail descend, CGA of 1)  Curbs: 6"(one curb step)  Device: Rolling walker(with R platform)  Assistance: Stand by assistance, Contact guard assistance  Comment: Patient able to lift R platform wheeled walker with cues for technique, 4 steps with rail   Mobility: Bed, Chair, Wheel Chair: 6 - Requires assistive device (slide rail)  Walk: 6 - Modified Independence  Walks at least 150 feet with an ambulatory device, orthosis or  prosthesis OR requires extra amount of time OR there is concern for safety  Distance Walked: 440  Stairs: 2- Maximal Assistance Performs 25-49% of the effort to go up and down 4 to 6 stairs and requires the assistance of one person only, PT Equipment Recommendations  Equipment Needed: Yes  Mobility Devices: Lawyer: Platform Right, Rolling, Assessment: Patient is a 72 y/o female with S/P gamma nail of R femur and ORIF R radius with c-arm on 11/22/17. Patient s/p fall on stairs and was independent with no device prior to fall. Patient has met 4/4 and 5/5 LTG.  Patient okay for room independence.  Pt able to ambulate 72' x2 with R platform walker with MI including transfers with intermittent cues for safety and hand placement.  Patient performed curb step with R platform wheeled walker with firm CGA, 4 steps with rail and CGA of 1. Patient independent with bed mobility. Pt's husband to come in this PM for family education. Patient limited by pain, weakness and decreased endurance. patient will benefit from ARU to improve overall independence with mobility.  Plan for D/C Saturday, 4/27, with home PT.    Occupational therapy: Eating: 6 - Feeds self with adaptive equipment/dentures and/or feeds self with modified diet and/or performs own tube feeding  Grooming: 5 - Requires setup/cues to do all tasks  Bathing: 6 - Able to bathe all 10 areas  with device  Dressing-Upper: 5 - Requires setup/supervision/cues and/or requires assist with presthesis/brace only  Dressing-Lower: 5 - Requires setup/supervision/cues and/or staff applies TEDS/prosthesis/brace only  Toileting: 6 - Requires device (grab bar/walker/etc.)  Toilet Transfer: 6 - Independent with device (grab bar/walker/slide bar)  Shower Transfer: (patient declined shower this date ),  , Assessment: Pt tolerated session well and able to complete dressing and bathing at SBA. Pt still requiring CGA for functional mobility/ transfers d/t occ LOB. Pt to be d/c with  husband tomorrow. Cont OT Tx POC.    Speech therapy:  Comprehension: 6 - Complex ideas 90% or device (hearing aid/glasses)  Expression: 6 - Device used to express complex ideas/needs  Social Interaction: 6 - Patient requires medication for mood and/or effect  Problem Solving: 5 - Patient able to solve simple/routine tasks  Memory: 5 - Patient requires prompting with stress/unfamiliar situations      Inpatient Rehabilitation Course:   Linda [name] is a 72 y.o. female admitted to inpatient rehabilitation on 11/24/2017 for s/p right intertrochanteric hip fracture, and ORIF of her right wrist fracture .  The patient participated in an aggressive multidisciplinary inpatient rehabilitation program involving 3 hours per day, 5 days per week of rehabilitation. This is a 72 year old white female who was at home in her usual state of health walking down the stairs yesterday when she lost her balance and fell breaking her right hip and her right wrist. ??The patient denied any loss of consciousness. ??There was no syncope or presyncope. She has had occasional palpitations due to her recent diagnosis of Graves disease. She has had some iron-deficiency anemia, but now that that has improved. Patient was taken to surgery by Dr. Bridgett Larsson for an IM nailing of her right intertrochanteric hip fracture, and ORIF of her right wrist fracture by Dr. Barnet Pall. Postoperatively she was started therapies, but her progress is slow due to both fractures. She needs more therapy before discharged to home safely.Patient does live at home with her husband in a two-story house. Her hip pain is not under control, so we'll change her pain medication at this time. After admission to the Rehab Unit, she made steady progress in her??therapies as listed above under each therapy section.Her upper and lower??extremity coordination and strength did improve in therapy, and she was starting to improve with her??endurance and functional??status with her NWB of her  right arm as she  participated in her??rehab therapies.??Her hip,wrist pain, labs??and blood pressure were monitored while on the rehabilitation unit. Patient has made big improvements in her therapies over this past week, and her pain is much less in her right hip area. We think patient will be ready for discharge to??home today with home therapies and equipment. Patient will need a rolling platform walker and transfer tub bench at home at discharge, and these have been ordered for her.. Will fill out her COC and meds today for her discharge. We'll send her home with Percocet for pain control, and aspirin for the next 2 weeks to thin her blood.  She'll follow-up with Dr. Bridgett Larsson or Barnet Pall, and her family doctor, Dr. Garen Grams.  ??    Condition at Discharge: Good    Significant Diagnostics:   Lab Results   Component Value Date    NA 138 11/28/2017    K 3.8 11/28/2017    CL 99 11/28/2017    BUN 10 11/28/2017    CREATININE 0.5 (L) 11/28/2017    GLUCOSE 98 11/28/2017    CALCIUM 8.8 11/28/2017  Lab Results   Component Value Date    WBC 4.0 11/28/2017    RBC 2.58 (L) 11/28/2017    HGB 8.6 (L) 11/28/2017    HCT 25.6 (L) 11/28/2017    MCV 99.3 11/28/2017    MCH 33.2 11/28/2017    MCHC 33.5 11/28/2017    RDW 13.9 11/28/2017    PLT 214 11/28/2017    MPV 7.3 11/28/2017     Lab Results   Component Value Date    PROTIME 11.2 11/21/2017    INR 0.98 11/21/2017     Lab Results   Component Value Date    APTT 28.3 11/21/2017     Lab Results   Component Value Date    COLORU YELLOW 08/02/2017    CLARITYU Clear 08/02/2017    GLUCOSEU Negative 08/02/2017    BILIRUBINUR Negative 08/02/2017    KETUA Negative 08/02/2017    SPECGRAV 1.019 08/02/2017    BLOODU Negative 08/02/2017    PHUR 6.0 08/02/2017    PROTEINU Negative 08/02/2017    UROBILINOGEN 0.2 08/02/2017    NITRU Negative 08/02/2017    LEUKOCYTESUR Negative 08/02/2017    LABMICR Not Indicated 08/02/2017    URRFLXCULT Not Indicated 06/10/2016    URINETYPE Not Specified 08/02/2017     Lab  Results   Component Value Date    WBCUA 2 06/24/2017    EPIU 1 06/24/2017    BACTERIA Rare (A) 06/10/2016     Lab Results   Component Value Date    LABURIN No growth at 18-36 hours 08/02/2017       Xr Wrist Right (2 Views)    Result Date: 11/22/2017  EXAMINATION: SPOT FLUOROSCOPIC IMAGES 11/22/2017 9:34 am TECHNIQUE: Fluoroscopy was provided by the radiology department for procedure. Radiologist was not present during examination. FLUOROSCOPY DOSE AND TYPE OR TIME AND EXPOSURES: 32 seconds, 2 images COMPARISON: 11/21/2017 radiograph HISTORY: Intraprocedural imaging. FINDINGS: 2 spot images of the right wrist were obtained. Fluoroscopic assistance provided for ORIF.     Intraprocedural fluoroscopic spot images as above.  See separate procedure report for more information.     Xr Wrist Right (min 3 Views)    Result Date: 11/21/2017  EXAMINATION: 3 XRAY VIEWS OF THE RIGHT WRIST 11/21/2017 3:16 am COMPARISON: Right wrist radiographs 11/21/2017. HISTORY: ORDERING SYSTEM PROVIDED HISTORY: post reduction TECHNOLOGIST PROVIDED HISTORY: Reason for exam:->post reduction Ordering Physician Provided Reason for Exam: post reduction rt wrist Type of Exam: Subsequent/Follow-up FINDINGS: Three views provided.  Casting material in place.  Decreased bone mineral density.  Redemonstration of comminuted intra-articular distal radius fracture which demonstrates mild impaction.  There is improved posttraumatic ulnar plus variance which now neutral and alignment.  Improved dorsal angulation with the articular surface now on the neutral position.  Stable displaced ulnar styloid process tip fracture.     Improved alignment of the intra-articular distal radial metaphyseal fracture with reduction of the posttraumatic ulnar plus variance and neutral position of the radial articular surface.     Xr Wrist Right (min 3 Views)    Result Date: 11/21/2017  EXAMINATION: XRAY VIEWS OF THE RIGHT WRIST 11/21/2017 12:59 am COMPARISON: None. HISTORY: ORDERING  SYSTEM PROVIDED HISTORY: pain, deformity TECHNOLOGIST PROVIDED HISTORY: Reason for exam:->pain, deformity Ordering Physician Provided Reason for Exam: fell pain rt wrist Acuity: Acute FINDINGS: Bones are diffusely demineralized. Comminuted displaced dorsally angulated distal radial metaphyseal fracture with intra-articular extension to the radiocarpal joint. Mildly displaced distal ulnar and ulnar styloid fractures. Carpal rows are maintained.  Intact scaphoid and  scapholunate interval. Diffuse soft tissue swelling about the wrist.  Osteoarthrosis about the [facility] joint.     Osteopenia with distal radial and ulnar fractures, as described above.     Xr Femur Right (min 2 Views)    Result Date: 11/22/2017  EXAMINATION: 4 FLUOROSCOPIC VIEWS OF THE RIGHT FEMUR 11/22/2017 9:34 am COMPARISON: None. HISTORY: ORDERING SYSTEM PROVIDED HISTORY: im nailing TECHNOLOGIST PROVIDED HISTORY: Reason for exam:->im nailing Fluoroscopy time 1 minute 18 seconds 18.7 mGy cumulative air Kerma 4 images FINDINGS: Intramedullary rod was placed across an intertrochanteric fracture.  Screws in the neck and distal shaft were placed.  Adjacent intraoperative gas is noted.  Alignment is normal.     Successful placement of intramedullary nail, reducing intertrochanteric fracture.  Please refer to procedure notes for additional details.     Ct Head Wo Contrast    Result Date: 11/21/2017  EXAMINATION: CT OF THE HEAD WITHOUT CONTRAST  11/21/2017 12:58 am TECHNIQUE: CT of the head was performed without the administration of intravenous contrast. Dose modulation, iterative reconstruction, and/or weight based adjustment of the mA/kV was utilized to reduce the radiation dose to as low as reasonably achievable. COMPARISON: CT head 11/17/2015 HISTORY: ORDERING SYSTEM PROVIDED HISTORY: fall TECHNOLOGIST PROVIDED HISTORY: Has a "code stroke" or "stroke alert" been called?->No Ordering Physician Provided Reason for Exam: fell r/o head injury Acuity: Acute  FINDINGS: BRAIN/VENTRICLES: There is no acute intracranial hemorrhage, mass effect or midline shift.  No abnormal extra-axial fluid collection.  The gray-white differentiation is maintained without evidence of an acute infarct.  There is no evidence of hydrocephalus. Stable mild diffuse cerebral volume loss with proportionate prominence of the ventricles and sulci.  Mild patchy low attenuation in the subcortical, periventricular and deep white matter likely reflects sequela of chronic microvascular ischemia. ORBITS: The visualized portion of the orbits demonstrate no acute abnormality. SINUSES: Left maxillary sinus mucosal thickening.  Otherwise clear paranasal sinuses and mastoid air cells. SOFT TISSUES/SKULL:  Right posterior scalp contusion.  No underlying displaced calvarial or skull base fracture.     Right posterior scalp contusion.  No underlying displaced calvarial fracture or acute intracranial abnormality. Age-related cerebral atrophy and chronic microvascular ischemic disease.     Ct Cervical Spine Wo Contrast    Result Date: 11/21/2017  EXAMINATION: CT OF THE CERVICAL SPINE WITHOUT CONTRAST 11/21/2017 12:58 am TECHNIQUE: CT of the cervical spine was performed without the administration of intravenous contrast. Multiplanar reformatted images are provided for review. Dose modulation, iterative reconstruction, and/or weight based adjustment of the mA/kV was utilized to reduce the radiation dose to as low as reasonably achievable. COMPARISON: None. HISTORY: ORDERING SYSTEM PROVIDED HISTORY: fall, pain TECHNOLOGIST PROVIDED HISTORY: Ordering Physician Provided Reason for Exam: fell r/o neck injury Acuity: Acute Relevant Medical/Surgical History: hx graves disease FINDINGS: BONES/ALIGNMENT: There is no evidence of an acute cervical spine fracture. There is normal alignment of the cervical spine. DEGENERATIVE CHANGES: Cervical spondylosis and facet arthrosis.  Mild canal narrowing at C5-C6.  Severe foraminal  narrowing from C4-C6 on the right and C4-C7 the left. SOFT TISSUES: No prevertebral soft tissue swelling.  Mild smooth interlobular septal thickening along the included lung apices which may indicate mild edema.     No acute abnormality of the cervical spine. Cervical spondylosis with facet arthrosis. Interlobular septal thickening at the included lung apices suggests mild edema.     Xr Chest 1 Vw    Result Date: 11/21/2017  EXAMINATION: SINGLE XRAY VIEW OF THE CHEST 11/21/2017 1:23 am COMPARISON:  Chest radiographs 07/17/2014; CT chest 07/17/2014 HISTORY: ORDERING SYSTEM PROVIDED HISTORY: fall, R rib pain TECHNOLOGIST PROVIDED HISTORY: Reason for exam:->fall, R rib pain Ordering Physician Provided Reason for Exam: fell rt rib pain Acuity: Acute FINDINGS: No pneumothorax, pleural effusion or airspace consolidation.  Normal heart size and mediastinal contours.  Gas-filled retrocardiac structure compatible with hiatal hernia, increased from prior exam.  No displaced rib fracture or any other acute osseous abnormality.  Lumbar fusion hardware partially imaged.     No rib fracture identified.  No acute cardiopulmonary findings. Large gas distended hiatal hernia, increased from prior exam.     Xr Hip 2-3 Vw W Pelvis Right    Result Date: 11/21/2017  EXAMINATION: SINGLE XRAY VIEW OF THE PELVIS AND 2 XRAY VIEWS RIGHT HIP 11/21/2017 12:59 am COMPARISON: Abdominal radiograph from upper GI series 08/09/2017 HISTORY: ORDERING SYSTEM PROVIDED HISTORY: fall, pain TECHNOLOGIST PROVIDED HISTORY: Reason for exam:->fall, pain Ordering Physician Provided Reason for Exam: fell pain rt hip Acuity: Acute FINDINGS: Displaced right femoral intertrochanteric fracture.  Right femoral head is otherwise seated anatomically within the acetabulum.  Intact left hip.  Bony pelvis is intact as well.  Lumbosacral fusion hardware appreciated.     Right femoral intertrochanteric fracture.       Patient Instructions:    Follow-up visits: She'll follow-up  with Dr. Bridgett Larsson or Barnet Pall, and her family doctor, Dr. Garen Grams.    Discharge Medications:     Medication List      CHANGE how you take these medications    aspirin 325 MG EC tablet  Take 1 tablet by mouth daily for 14 days Begin after Lovenox dosing is completed.  Start taking on:  12/09/2017  What changed:  These instructions start on 12/09/2017. If you are unsure what to do until then, ask your doctor or other care provider.     oxyCODONE-acetaminophen 5-325 MG per tablet  Commonly known as:  PERCOCET  Take 2 tablets by mouth every 4 hours as needed for Pain for up to 14 days.  What changed:  how much to take        CONTINUE taking these medications    B12 Folate 800-800 MCG Caps  One a day indefinitely     Calcium Carbonate-Vitamin D 600-400 MG-UNIT Chew  Commonly known as:  CALTRATE 600+D  Take 1 tablet by mouth 2 times daily.     divalproex 500 MG extended release tablet  Commonly known as:  DEPAKOTE ER  Take 1 tablet by mouth nightly     docusate 100 MG Caps  Commonly known as:  COLACE, DULCOLAX  Take 100 mg by mouth 2 times daily     pantoprazole 40 MG tablet  Commonly known as:  PROTONIX  Take 1 tablet by mouth every morning (before breakfast)     PARoxetine 20 MG tablet  Commonly known as:  PAXIL  TAKE 1 TABLET BY MOUTH EVERY DAY     Polysaccharide Iron Complex 391.3 (180 Fe) MG Caps  One a day for six months        STOP taking these medications    enoxaparin 40 MG/0.4ML injection  Commonly known as:  LOVENOX           Where to Get Your Medications      You can get these medications from any pharmacy    Bring a paper prescription for each of these medications  ?? aspirin 325 MG EC tablet  ?? oxyCODONE-acetaminophen 5-325 MG per tablet         ??  I spent over 35 minutes on this discharge encounter between counseling, coordination of care, and medication reconciliation.   ??  ??  To comply with [facility] bylaw R.II.[id number]:   Discharge order placed in advance to facilitate patient???s discharge needs      Barth Kirks, MD,  11/30/2017, 8:05 AM

## 2017-11-30 NOTE — Plan of Care (Signed)
Problem: Risk for Impaired Skin Integrity  Goal: Tissue integrity - skin and mucous membranes  Description  Structural intactness and normal physiological function of skin and  mucous membranes.  11/30/2017 7829 by Westley Foots, RN  Outcome: Ongoing  11/30/2017 0030 by Enis Gash, RN  Outcome: Ongoing     Problem: IP BLADDER/VOIDING  Goal: LTG - patient will complete bladder elimination  11/30/2017 0927 by Westley Foots, RN  Outcome: Ongoing  11/30/2017 0030 by Enis Gash, RN  Outcome: Ongoing  Goal: LTG - patient will achieve acceptable level of continence  11/30/2017 0927 by Westley Foots, RN  Outcome: Ongoing  11/30/2017 0030 by Enis Gash, RN  Outcome: Ongoing  Goal: STG - patient will be able to empty bladder  11/30/2017 0927 by Westley Foots, RN  Outcome: Ongoing  11/30/2017 0030 by Enis Gash, RN  Outcome: Ongoing     Problem: NUTRITION  Goal: Patient maintains adequate hydration  11/30/2017 0927 by Westley Foots, RN  Outcome: Ongoing  11/30/2017 0030 by Enis Gash, RN  Outcome: Ongoing  Goal: Patient maintains weight  Outcome: Ongoing  Goal: Patient/Family demonstrates understanding of diet  Outcome: Ongoing  Goal: Patient/Family independently completes tube feeding  Outcome: Ongoing  Goal: Patient will have no more than 5 lb weight change during LOS  Outcome: Ongoing  Goal: Patient will utilize adaptive techniques to administer nutrition  Outcome: Ongoing  Goal: Patient will verbalize dietary restrictions  Outcome: Ongoing     Problem: SAFETY  Goal: LTG - patient will adhere to hip precautions during ADL's and transfers  11/30/2017 0927 by Westley Foots, RN  Outcome: Ongoing  11/30/2017 0030 by Enis Gash, RN  Outcome: Ongoing  Goal: LTG - Patient will demonstrate safety requirements appropriate to situation/environment  11/30/2017 0927 by Westley Foots, RN  Outcome: Ongoing  11/30/2017 0030 by Enis Gash, RN  Outcome: Ongoing  Goal: LTG - patient will utilize safety techniques  11/30/2017 0927 by Westley Foots, RN  Outcome: Ongoing  11/30/2017 0030 by Enis Gash, RN  Outcome: Ongoing  Goal: STG - patient locks brakes on wheelchair  Outcome: Ongoing  Goal: STG - Patient uses call light consistently to request assistance with transfers  Outcome: Ongoing  Goal: STG - patient uses gait belt during all transfers  Outcome: Ongoing     Problem: SKIN INTEGRITY  Goal: LTG - Patient will be free from infection  Outcome: Ongoing  Goal: LTG - patient will maintain/improve skin integrity through proper skin care techniques  Outcome: Ongoing  Goal: LTG - Patient will demonstrate appropriate pressure relief techniques  Outcome: Ongoing  Goal: LTG - patient will demonstrate appropriate skin care techniques  Outcome: Ongoing  Goal: LTG - Patient will be free from infection  Outcome: Ongoing  Goal: STG - Patient demonstrates skin care/treatment/dressing change  Outcome: Ongoing  Goal: STG - patient will maintain good skin integrity  Outcome: Ongoing  Goal: STG - Patient exhibits signs of wound healing.  Outcome: Ongoing  Goal: STG - patient demonstrates pressure reduction techniques  Outcome: Ongoing  Goal: STG - Patient demonstrates preventative skin care measures  Outcome: Ongoing     Problem: PAIN  Goal: LTG - Patient will manage pain with the appropriate technique/Intervention  11/30/2017 0927 by Westley Foots, RN  Outcome: Ongoing  11/30/2017 0030 by Enis Gash, RN  Outcome: Ongoing  Goal: LTG - Patient will demonstrate intervention for managing pain  11/30/2017 0927 by Westley Foots, RN  Outcome: Ongoing  11/30/2017 0030 by Enis Gash, RN  Outcome: Ongoing  Goal: STG - Patient will reduce or eliminate use of analgesics  11/30/2017 0927 by Westley Foots, RN  Outcome: Ongoing  11/30/2017 0030 by Enis Gash, RN  Outcome: Ongoing  Goal: STG - pain is manageable through therapies  11/30/2017 0927 by Westley Foots, RN  Outcome: Ongoing  11/30/2017 0030 by Enis Gash, RN  Outcome: Ongoing  Goal: STG - Patient will verbalize an  acceptable level of pain  Outcome: Ongoing  Goal: STG - patients pain is managed to allow active participation in daily activities  Outcome: Ongoing  Goal: STG - Patient will increase activity level  Outcome: Ongoing  Goal: STG - patient verbalizes a reduction in pain level  Outcome: Ongoing     Problem: Daily Care:  Goal: Daily care needs are met  Description  Daily care needs are met  11/30/2017 0927 by Westley Foots, RN  Outcome: Ongoing  11/30/2017 0030 by Enis Gash, RN  Outcome: Ongoing     Problem: Discharge Planning:  Goal: Patients continuum of care needs are met  Description  Patients continuum of care needs are met  11/30/2017 0927 by Westley Foots, RN  Outcome: Ongoing  11/30/2017 0030 by Enis Gash, RN  Outcome: Ongoing     Problem: Infection - Surgical Site:  Goal: Will show no infection signs and symptoms  Description  Will show no infection signs and symptoms  11/30/2017 0927 by Westley Foots, RN  Outcome: Ongoing  11/30/2017 0030 by Enis Gash, RN  Outcome: Ongoing     Problem: Mobility - Impaired:  Goal: Mobility will improve to maximum level  Description  Mobility will improve to maximum level  11/30/2017 0927 by Westley Foots, RN  Outcome: Ongoing  11/30/2017 0030 by Enis Gash, RN  Outcome: Ongoing     Problem: Tobacco Use:  Goal: Inpatient tobacco use cessation counseling participation  Description  Inpatient tobacco use cessation counseling participation  Outcome: Ongoing     Problem: Pain:  Goal: Pain level will decrease  Description  Pain level will decrease  Outcome: Ongoing  Goal: Control of acute pain  Description  Control of acute pain  Outcome: Ongoing  Goal: Control of chronic pain  Description  Control of chronic pain  Outcome: Ongoing

## 2017-11-30 NOTE — Plan of Care (Signed)
Problem: Risk for Impaired Skin Integrity  Goal: Tissue integrity - skin and mucous membranes  Description  Structural intactness and normal physiological function of skin and  mucous membranes.  11/30/2017 0030 by Shela Nevin, RN  Outcome: Ongoing  11/29/2017 1333 by Glenetta Borg, RN  Outcome: Ongoing  Note:   Monitor no issues noted, no breakdown noted on arm     Problem: IP BLADDER/VOIDING  Goal: LTG - patient will complete bladder elimination  11/30/2017 0030 by Shela Nevin, RN  Outcome: Ongoing  11/29/2017 1333 by Glenetta Borg, RN  Outcome: Ongoing  Note:   Has room I, no issues note  Goal: LTG - patient will achieve acceptable level of continence  Outcome: Ongoing  Goal: STG - patient will be able to empty bladder  Outcome: Ongoing     Problem: NUTRITION  Goal: Patient maintains adequate hydration  11/30/2017 0030 by Shela Nevin, RN  Outcome: Ongoing  11/29/2017 1333 by Glenetta Borg, RN  Outcome: Ongoing  Note:   States does not eat breakfast, cues for intake.     Problem: SAFETY  Goal: LTG - patient will adhere to hip precautions during ADL's and transfers  11/30/2017 0030 by Shela Nevin, RN  Outcome: Ongoing  11/29/2017 1333 by Glenetta Borg, RN  Outcome: Ongoing  Note:   Doing well with transfers, has room I.  Goal: LTG - Patient will demonstrate safety requirements appropriate to situation/environment  Outcome: Ongoing  Goal: LTG - patient will utilize safety techniques  Outcome: Ongoing     Problem: SKIN INTEGRITY  Goal: LTG - Patient will be free from infection  11/29/2017 1333 by Glenetta Borg, RN  Outcome: Ongoing  Note:   No issues noted, monitor.     Problem: PAIN  Goal: LTG - Patient will manage pain with the appropriate technique/Intervention  11/30/2017 0030 by Shela Nevin, RN  Outcome: Ongoing  11/29/2017 1333 by Glenetta Borg, RN  Outcome: Ongoing  Note:   On Oxycontin, reviewed pain control and pt uses ice and rest with relief.  Goal: LTG - Patient  will demonstrate intervention for managing pain  Outcome: Ongoing  Goal: STG - Patient will reduce or eliminate use of analgesics  Outcome: Ongoing  Goal: STG - pain is manageable through therapies  Outcome: Ongoing     Problem: Daily Care:  Goal: Daily care needs are met  Description  Daily care needs are met  11/30/2017 0030 by Shela Nevin, RN  Outcome: Ongoing  11/29/2017 1333 by Glenetta Borg, RN  Outcome: Ongoing  Note:   Doing well with self care, monitor     Problem: Discharge Planning:  Goal: Patients continuum of care needs are met  Description  Patients continuum of care needs are met  11/30/2017 0030 by Shela Nevin, RN  Outcome: Ongoing  11/29/2017 1333 by Glenetta Borg, RN  Outcome: Ongoing  Note:   Discharge planned for Saturday     Problem: Infection - Surgical Site:  Goal: Will show no infection signs and symptoms  Description  Will show no infection signs and symptoms  Outcome: Ongoing     Problem: Mobility - Impaired:  Goal: Mobility will improve to maximum level  Description  Mobility will improve to maximum level  11/30/2017 0030 by Shela Nevin, RN  Outcome: Ongoing  11/29/2017 1333 by Glenetta Borg, RN  Outcome: Ongoing  Note:   Room I with platform walker.     Problem: Tobacco Use:  Goal:  Inpatient tobacco use cessation counseling participation  Description  Inpatient tobacco use cessation counseling participation  11/29/2017 1333 by Glenetta Borg, RN  Outcome: Ongoing  Note:   Reviewed issues with second hand smoke.     Problem: Pain:  Goal: Pain level will decrease  Description  Pain level will decrease  11/29/2017 1333 by Glenetta Borg, RN  Outcome: Ongoing  Note:   States pain under control, uses ice and rest with relief

## 2017-12-06 NOTE — Telephone Encounter (Signed)
Have not been able to get in with patient to do home health. She is not answering our calls.    Patient can call [facility] at [phone] to schedule if they want home health ask for sherry or barb.

## 2017-12-11 ENCOUNTER — Ambulatory Visit
Admit: 2017-12-11 | Discharge: 2017-12-11 | Payer: MEDICARE | Attending: Orthopaedic Surgery | Primary: Internal Medicine

## 2017-12-11 ENCOUNTER — Ambulatory Visit: Admit: 2017-12-11 | Payer: MEDICARE | Primary: Internal Medicine

## 2017-12-11 DIAGNOSIS — Z9889 Other specified postprocedural states: Secondary | ICD-10-CM

## 2017-12-11 NOTE — Progress Notes (Signed)
DIAGNOSIS:  Right intertrochanteric femur fracture, status post Gamma nail.    DATE OF SURGERY:  11/22/17    HISTORY OF PRESENT ILLNESS: Linda [name] 72 y.o. female  who comes in today for postoperative visit.  The patient rates pain 3/10.  She has been weight bearing as tolerated.  No numbness or tingling sensation. No fever or Chills.  She is at home already.  She is not doing home PT because she states she told them to "go away."  She states she is doing exercises on her own.      PHYSICAL EXAMINATION:    BP 132/70   Pulse (!) 43   Wt 140 lb (63.5 kg)   BMI 24.03 kg/m     Patient is awake, alert, and in no acute distress.  The incisions are healed well, all staples were removed .  No signs of any erythema or drainage.   She has no pain with the active or passive range of motion of the right hip.  She has intact sensation to light touch distally in bilateral legs.  Bilateral legs are warm and well perfused.    IMAGING:  Two views (AP and lateral) right  Hip, and AP pelvis were obtained and reviewed.  Showed anatomic alignment of the intertrochanteric fracture, Gamma nail in good position, no loosening, or hardware failure.        IMPRESSION:    1.  2 1/2 weeks status post right Gamma nail of intertrochanteric femur fracture.  2.  Noncompliance      PLAN:    Will have the patient continue to work on ROM and strengthening with PT.    Weight bearing as tolerated.    The patient will come back for a follow up in 6 weeks.  At that time, we will take Two views right hip, and AP pelvis.  I am also more than happy to see her for follow up of her wrist after she at least has one postop visit with Dr. Su Hoff, so that she does not have to keep coming back to see [place] individually.  If Dr. Su Hoff is ok with that, then we will also obtain wrist xrays at next visit.    As this patient has demonstrated risk factors for osteoporosis, such as age greater than fifty years and evidence of a fracture, I have referred the patient back  to the primary care physician for evaluation for osteoporosis, including consideration for DEXA scanning, if this is felt to be clinically indicated.  The patient is advised to contact the primary care physician to follow-up for further evaluation.

## 2017-12-20 ENCOUNTER — Ambulatory Visit
Admit: 2017-12-20 | Discharge: 2017-12-20 | Payer: MEDICARE | Attending: Orthopaedic Surgery | Primary: Internal Medicine

## 2017-12-20 ENCOUNTER — Ambulatory Visit: Admit: 2017-12-20 | Payer: MEDICARE | Primary: Internal Medicine

## 2017-12-20 DIAGNOSIS — M25531 Pain in right wrist: Secondary | ICD-10-CM

## 2017-12-20 NOTE — Progress Notes (Signed)
DIAGNOSIS:  Right distal radius 3 parts intra articular displaced fracture, status post ORIF.    DATE OF SURGERY:  11/22/2017.    HISTORY OF PRESENT ILLNESS:  Ms. Peri 72 y.o. Caucasian female right handed returns today  for 2 weeks postoperative visit.  The patient denies any significant pain in the right wrist. Rates pain a 2/10 VAS mild, aching, intermittent and are improving. Aggravating factors movement. Alleviating factors rest. No numbness or tingling sensation. No fever or Chills. She  is in a splint.    PHYSICAL EXAMINATION:  The incision is completely healed .  No signs of any erythema or drainage. She  has no pain with the active or passive range of motion of the right wrist, but decrease ROM.  She  has intact sensation, distally, and she  is neurovascularly intact.    IMAGING:  Three views right wrist taken today in the office showed anatomic alignment of right distal radius, plate and screws in good position, no loosening.      IMPRESSION:  2 weeks out from right  Distal radius ORIF and doing very well.    PLAN:  I have told the patient to work on ROM. NWB right arm. A removable forearm brace applied. No heavy impact activities. The patient will come back for a follow up in 6 weeks.  At that time, we will take 3 views of the right wrist. PT if needed then.    As this patient has demonstrated risk factors for osteoporosis, such as age greater than 64 years and evidence of a fracture, I have referred the patient back to the primary care physician for evaluation for osteoporosis, including consideration for DEXA scanning, if this is felt to be clinically indicated.  The patient is advised to contact the primary care physician to follow-up for further evaluation.         Procedures   ??? Hely Weber Titan Wrist Long Forearm Brace     Patient was prescribed a Hely Weber Titan Wrist and Forearm Brace.   The right wrist will require stabilization / immobilization from this semi-rigid / rigid orthosis to  improve their function.  The orthosis will assist in protecting the affected area, provide functional support and facilitate healing.    The patient was educated and fit by a Neurosurgeon with expert knowledge and specialization in brace application while under the direct supervision of the treating physician.  Verbal and written instructions for the use of and application of this item were provided.   They were instructed to contact the office immediately should the brace result in increased pain, decreased sensation, increased swelling or worsening of the condition.     Judithann Sheen, APRN - CNP

## 2017-12-24 ENCOUNTER — Ambulatory Visit: Admit: 2017-12-24 | Discharge: 2017-12-24 | Payer: MEDICARE | Attending: Internal Medicine | Primary: Internal Medicine

## 2017-12-24 ENCOUNTER — Encounter

## 2017-12-24 DIAGNOSIS — D500 Iron deficiency anemia secondary to blood loss (chronic): Secondary | ICD-10-CM

## 2017-12-24 LAB — CBC WITH AUTO DIFFERENTIAL
Basophils %: 0.4 %
Basophils Absolute: 0 10*3/uL (ref 0.0–0.2)
Eosinophils %: 4.6 %
Eosinophils Absolute: 0.3 10*3/uL (ref 0.0–0.6)
Hematocrit: 40.5 % (ref 36.0–48.0)
Hemoglobin: 13.5 g/dL (ref 12.0–16.0)
Lymphocytes %: 41.9 %
Lymphocytes Absolute: 2.6 10*3/uL (ref 1.0–5.1)
MCH: 33.5 pg (ref 26.0–34.0)
MCHC: 33.3 g/dL (ref 31.0–36.0)
MCV: 100.7 fL — ABNORMAL HIGH (ref 80.0–100.0)
MPV: 8.1 fL (ref 5.0–10.5)
Monocytes %: 5.6 %
Monocytes Absolute: 0.3 10*3/uL (ref 0.0–1.3)
Neutrophils %: 47.5 %
Neutrophils Absolute: 2.9 10*3/uL (ref 1.7–7.7)
Platelets: 236 10*3/uL (ref 135–450)
RBC: 4.02 M/uL (ref 4.00–5.20)
RDW: 15.3 % (ref 12.4–15.4)
WBC: 6.1 10*3/uL (ref 4.0–11.0)

## 2017-12-24 LAB — IRON AND TIBC
Iron Saturation: 19 % (ref 15–50)
Iron: 70 ug/dL (ref 37–145)
TIBC: 371 ug/dL (ref 260–445)

## 2017-12-24 MED ORDER — PAROXETINE HCL 40 MG PO TABS
40 MG | ORAL_TABLET | Freq: Every day | ORAL | 3 refills | Status: DC
Start: 2017-12-24 — End: 2018-10-27

## 2017-12-24 MED ORDER — TRAZODONE HCL 50 MG PO TABS
50 MG | ORAL_TABLET | ORAL | 5 refills | Status: DC
Start: 2017-12-24 — End: 2018-06-30

## 2017-12-24 MED ORDER — TRAZODONE HCL 50 MG PO TABS
50 MG | ORAL_TABLET | Freq: Every evening | ORAL | 5 refills | Status: DC | PRN
Start: 2017-12-24 — End: 2017-12-24

## 2017-12-24 NOTE — Progress Notes (Signed)
OUTPATIENT PROGRESS NOTE  Date of Service:  12/24/2017  Address: [facility]  [place] [address]  Dept: [phone]  Loc: [phone]    Subjective:     Chief Complaint   Patient presents with   ??? Follow-Up from Hospital     PatientID: [id number]  Linda [name] is a 72 y.o. female    HPI  Problem List Items Addressed This Visit     Closed fracture of right distal radius healing well    Closed intertrochanteric fracture of hip, right, initial encounter ([place]) healing well but now hurts down into her right knee and has been taking percocet for that as well.  Has had oa in left knee    Depression been feeling very blue not able to do much    Graves disease  Needs an update on labs     Iron deficiency anemia secondary to blood loss (chronic) - Primary is still on iron           Allergies   Allergen Reactions   ??? Actonel [Risedronate Sodium]      Osteonecrosis jaw documented     Current Outpatient Medications   Medication Sig Dispense Refill   ??? aspirin 325 MG EC tablet Take 1 tablet by mouth daily for 14 days Begin after Lovenox dosing is completed. 14 tablet 0   ??? docusate sodium (COLACE, DULCOLAX) 100 MG CAPS Take 100 mg by mouth 2 times daily 60 capsule 0   ??? pantoprazole (PROTONIX) 40 MG tablet Take 1 tablet by mouth every morning (before breakfast) 30 tablet 3   ??? divalproex (DEPAKOTE ER) 500 MG extended release tablet Take 1 tablet by mouth nightly 30 tablet 3   ??? PARoxetine (PAXIL) 20 MG tablet TAKE 1 TABLET BY MOUTH EVERY DAY 30 tablet 1   ??? Polysaccharide Iron Complex 391.3 (180 Fe) MG CAPS One a day for six months 30 capsule 5   ??? Cobalamine Combinations (B12 FOLATE) 800-800 MCG CAPS One a day indefinitely 30 capsule 3   ??? Calcium Carbonate-Vitamin D (CALTRATE 600+D) 600-400 MG-UNIT CHEW Take 1 tablet by mouth 2 times daily. 60 tablet 11     No current facility-administered medications for this visit.      Past Medical History:   Diagnosis  Date   ??? 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 Celiac artery atherosclerosis 2015    60% proximal stenosis   ??? Colon polyps     adenomatous    ??? Dyspepsia     nonulcerative   ??? Graves disease    ??? High cholesterol    ??? History of depression    ??? Hypertension    ??? 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 Migraine     recurrent, controlled with elavil   ??? Osteoarthritis of lumbar spine     [place]   ??? 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 Postmenopausal    ??? Thoracic compression fracture ([place])     T10 from a fall     Past Surgical History:   Procedure Laterality Date   ??? ARTERY BIOPSY Right 12-24-12    Temporal Artery biopsy right    ??? COLONOSCOPY  [id number]    dr Awanda Mink   3 years   ??? COLONOSCOPY N/A 07/09/2017    COLONOSCOPY POLYPECTOMY  SNARE performed by Orvan Seen, MD at [place]   ??? COLONOSCOPY N/A 07/09/2017    COLONOSCOPY WITH BIOPSY performed by Orvan Seen, MD at [place]   ??? ESOPHAGEAL MOTILITY STUDY N/A 08/27/2017    ESOPHAGEAL MANOMETRY performed by Orvan Seen, MD at [place]   ??? FIXATION KYPHOPLASTY      t10   ??? HIP FRACTURE SURGERY Right 11/22/2017    RIGHT HIP GAMMA NAILING performed by Arville Lime, MD at [place]   ??? HIP SURGERY Right 11/22/2017    right hip gamma nailing   ??? ORIF DISTAL RADIUS FRACTURE Right 11/22/2017    OPEN REDUCTION INTERNAL FIXATION RIGHT RADIUS WITH C-ARM performed by Vevelyn Francois, MD at [facility]   ??? SHOULDER SURGERY      left humerus fx 3 places   ??? UPPER GASTROINTESTINAL ENDOSCOPY  08/26/2014    dr Shanon [place] hess:negative   ??? UPPER GASTROINTESTINAL ENDOSCOPY N/A 08/08/2017    EGD ESOPHAGOGASTRODUODENOSCOPY performed by Orvan Seen, MD at [place]   ??? WRIST FRACTURE SURGERY Right 11/22/2017     Social History     Tobacco Use   ??? Smoking status: Current Some Day Smoker     Packs/day: 0.25     Years: 40.00     Pack years: 10.00      Last attempt to quit: 08/06/1997     Years since quitting: 20.3   ??? Smokeless tobacco: Never Used   ??? Tobacco comment: passive exposure husband chain smokes   Substance Use Topics   ??? Alcohol use: Yes     Alcohol/week: 0.0 oz     Comment: rarely   ??? Drug use: No     Family History   Problem Relation Age of Onset   ??? Migraines Other    ??? Diabetes Other    ??? Breast Cancer Sister    ??? Cancer Mother    ??? Cancer Father           Review of Systems   Psychiatric/Behavioral: Positive for dysphoric mood and sleep disturbance.        Not sleeping well   All other systems reviewed and are negative.      Objective:   Physical Exam   Constitutional: She is oriented to person, place, and time. She appears well-developed and well-nourished. No distress.   pale   HENT:   Head: Normocephalic and atraumatic.   Right Ear: External ear normal.   Left Ear: External ear normal.   Nose: Nose normal.   Mouth/Throat: Oropharynx is clear and moist.   Eyes: Pupils are equal, round, and reactive to light. EOM are normal. Right eye exhibits no discharge. Left eye exhibits no discharge.   Neck: Normal range of motion. Neck supple. No JVD present. No tracheal deviation present. No thyromegaly present.   Cardiovascular: Normal rate, regular rhythm and normal heart sounds. Exam reveals no gallop.   No murmur heard.  Pos sys murmur slow 56   Pulmonary/Chest: Effort normal and breath sounds normal. No stridor. No respiratory distress.   Abdominal: Soft. Bowel sounds are normal. She exhibits no distension. There is no tenderness.   Musculoskeletal: Normal range of motion. She exhibits no edema.   Lymphadenopathy:     She has no cervical adenopathy.   Neurological: She is alert and oriented to person, place, and time. She has normal reflexes.   Skin: Skin is warm and dry. She is not  diaphoretic.   Psychiatric: She has a normal mood and affect. Her behavior is normal. Judgment and thought content normal.   Nursing note and vitals  reviewed.        Assessment/Plan     1. Iron deficiency anemia secondary to blood loss (chronic)  Needs eventually to get GI surgery  Plan : recheck today    2. Graves disease  Ongoing  Recheck today    3. Closed intertrochanteric fracture of hip, right, initial encounter ([place])  Repaired  Fu with ortho  No bisphos for her     4. Closed fracture of distal end of right radius, unspecified fracture morphology, initial encounter  Repaired     5. Severe episode of recurrent major depressive disorder, without psychotic features ([place])  Worse right now  Ok to double paxil    6.  Tylenol arthritis for right knee pain                Angelica Pou, MD

## 2017-12-24 NOTE — Patient Instructions (Signed)
Tylenol arthriti 650mg  strgth 2 at a time

## 2017-12-25 LAB — TSH: TSH: 18.84 u[IU]/mL — ABNORMAL HIGH (ref 0.27–4.20)

## 2017-12-25 LAB — FERRITIN: Ferritin: 149.2 ng/mL (ref 15.0–150.0)

## 2017-12-25 LAB — T3, FREE: T3, Free: 2.9 pg/mL (ref 2.3–4.2)

## 2017-12-25 LAB — T4, FREE: T4 Free: 1 ng/dL (ref 0.9–1.8)

## 2017-12-31 MED ORDER — CELECOXIB 200 MG PO CAPS
200 MG | ORAL_CAPSULE | Freq: Every day | ORAL | 3 refills | Status: DC
Start: 2017-12-31 — End: 2017-12-31

## 2017-12-31 NOTE — Telephone Encounter (Signed)
Patient notified and wants to try

## 2017-12-31 NOTE — Telephone Encounter (Signed)
I can call in celebrex for her to try

## 2017-12-31 NOTE — Telephone Encounter (Signed)
Patient saw Dr. Garen Grams when her knee was hurting and patient was told to try over the counter arthritis medication and that is not working.  Patient would like Dr Garen Grams to prescribe pain medication for her knee    Linda [name] [phone]    [facility]  [address]  Ph [phone]  Fax [phone]    Please Advise

## 2018-01-01 MED ORDER — CELECOXIB 200 MG PO CAPS
200 MG | ORAL_CAPSULE | Freq: Every day | ORAL | 3 refills | Status: DC
Start: 2018-01-01 — End: 2018-01-10

## 2018-01-02 NOTE — Telephone Encounter (Signed)
Patient is calling stating the Celebrex Dr. Eliseo Squires prescribed her for her pain in her knee is not working and she is wondering what else can be done or prescribed. Patient is aware that Dr. Eliseo Squires is out of the office until tomorrow     [facility] [address] - [place], [place] - [address] - [place] [phone] Carmon Ginsberg [phone]    Please Advise   CB# [phone]

## 2018-01-03 NOTE — Telephone Encounter (Signed)
I want Linda [name] to get a repeat dexa scan   Last one 2013     And I want her to start Fosamax   She could see If Dr Bridgett Larsson or Barnet Pall will give her more pain pills

## 2018-01-03 NOTE — Telephone Encounter (Signed)
Spoke to Dr. Garen Grams

## 2018-01-03 NOTE — Telephone Encounter (Signed)
Mailbox full

## 2018-01-03 NOTE — Telephone Encounter (Signed)
Mailbox full  I would rather not use narcotics on her  Unable to leave any message

## 2018-01-07 ENCOUNTER — Emergency Department: Admit: 2018-01-07 | Payer: MEDICARE | Primary: Internal Medicine

## 2018-01-07 ENCOUNTER — Inpatient Hospital Stay
Admission: EM | Admit: 2018-01-07 | Discharge: 2018-01-10 | Disposition: A | Discharge status: Home / Self Care | Payer: Medicare Other | Source: Other Acute Inpatient Hospital | Admitting: Internal Medicine

## 2018-01-07 DIAGNOSIS — K254 Chronic or unspecified gastric ulcer with hemorrhage: Principal | ICD-10-CM

## 2018-01-07 LAB — CBC WITH AUTO DIFFERENTIAL
Basophils %: 0.2 %
Basophils Absolute: 0 10*3/uL (ref 0.0–0.2)
Eosinophils %: 0.5 %
Eosinophils Absolute: 0 10*3/uL (ref 0.0–0.6)
Hematocrit: 33.1 % — ABNORMAL LOW (ref 36.0–48.0)
Hemoglobin: 11.3 g/dL — ABNORMAL LOW (ref 12.0–16.0)
Lymphocytes %: 21.7 %
Lymphocytes Absolute: 1.7 10*3/uL (ref 1.0–5.1)
MCH: 33.8 pg (ref 26.0–34.0)
MCHC: 34.1 g/dL (ref 31.0–36.0)
MCV: 99.1 fL (ref 80.0–100.0)
MPV: 7.9 fL (ref 5.0–10.5)
Monocytes %: 4.3 %
Monocytes Absolute: 0.3 10*3/uL (ref 0.0–1.3)
Neutrophils %: 73.3 %
Neutrophils Absolute: 5.6 10*3/uL (ref 1.7–7.7)
Platelets: 193 10*3/uL (ref 135–450)
RBC: 3.34 M/uL — ABNORMAL LOW (ref 4.00–5.20)
RDW: 15.3 % (ref 12.4–15.4)
WBC: 7.7 10*3/uL (ref 4.0–11.0)

## 2018-01-07 LAB — COMPREHENSIVE METABOLIC PANEL
ALT: 8 U/L — ABNORMAL LOW (ref 10–40)
AST: 14 U/L — ABNORMAL LOW (ref 15–37)
Albumin/Globulin Ratio: 1.3 (ref 1.1–2.2)
Albumin: 3.9 g/dL (ref 3.4–5.0)
Alkaline Phosphatase: 206 U/L — ABNORMAL HIGH (ref 40–129)
Anion Gap: 8 (ref 3–16)
BUN: 38 mg/dL — ABNORMAL HIGH (ref 7–20)
CO2: 28 mmol/L (ref 21–32)
Calcium: 9 mg/dL (ref 8.3–10.6)
Chloride: 102 mmol/L (ref 99–110)
Creatinine: 0.6 mg/dL (ref 0.6–1.2)
GFR African American: >60 (ref >60)
GFR Non-African American: >60 (ref >60)
Globulin: 3.1 g/dL
Glucose: 131 mg/dL — ABNORMAL HIGH (ref 70–99)
Potassium: 4.4 mmol/L (ref 3.5–5.1)
Sodium: 138 mmol/L (ref 136–145)
Total Bilirubin: 0.5 mg/dL (ref 0.0–1.0)
Total Protein: 7 g/dL (ref 6.4–8.2)

## 2018-01-07 LAB — PROTIME-INR
INR: 1.34 — ABNORMAL HIGH (ref 0.86–1.14)
Protime: 15.3 s — ABNORMAL HIGH (ref 9.8–13.0)

## 2018-01-07 LAB — TYPE AND SCREEN
ABO/Rh: O POS
Antibody Screen: NEGATIVE

## 2018-01-07 LAB — BLOOD OCCULT STOOL DIAGNOSTIC: Occult Blood Diagnostic: POSITIVE — AB

## 2018-01-07 LAB — LIPASE: Lipase: 13 U/L (ref 13.0–60.0)

## 2018-01-07 LAB — HEMOGLOBIN: Hemoglobin: 9.5 g/dL — ABNORMAL LOW (ref 12.0–16.0)

## 2018-01-07 MED ORDER — ONDANSETRON HCL 4 MG/2ML IJ SOLN
4 MG/2ML | Freq: Once | INTRAMUSCULAR | Status: AC
Start: 2018-01-07 — End: 2018-01-07
  Administered 2018-01-07: 14:00:00 4 mg via INTRAVENOUS

## 2018-01-07 MED ORDER — ACETAMINOPHEN 325 MG PO TABS
325 MG | ORAL | Status: DC | PRN
Start: 2018-01-07 — End: 2018-01-10

## 2018-01-07 MED ORDER — NORMAL SALINE FLUSH 0.9 % IV SOLN
0.9 % | Freq: Two times a day (BID) | INTRAVENOUS | Status: DC
Start: 2018-01-07 — End: 2018-01-10
  Administered 2018-01-08 – 2018-01-10 (×5): 10 mL via INTRAVENOUS

## 2018-01-07 MED ORDER — ONDANSETRON HCL 4 MG/2ML IJ SOLN
4 MG/2ML | Freq: Four times a day (QID) | INTRAMUSCULAR | Status: DC | PRN
Start: 2018-01-07 — End: 2018-01-10
  Administered 2018-01-08 (×2): 4 mg via INTRAVENOUS

## 2018-01-07 MED ORDER — DIVALPROEX SODIUM ER 500 MG PO TB24
500 MG | Freq: Every evening | ORAL | Status: DC
Start: 2018-01-07 — End: 2018-01-10
  Administered 2018-01-08 – 2018-01-10 (×3): 500 mg via ORAL

## 2018-01-07 MED ORDER — MORPHINE SULFATE (PF) 2 MG/ML IV SOLN
2 MG/ML | Freq: Once | INTRAVENOUS | Status: AC
Start: 2018-01-07 — End: 2018-01-07
  Administered 2018-01-07: 14:00:00 4 mg via INTRAVENOUS

## 2018-01-07 MED ORDER — NORMAL SALINE FLUSH 0.9 % IV SOLN
0.9 % | INTRAVENOUS | Status: DC | PRN
Start: 2018-01-07 — End: 2018-01-10
  Administered 2018-01-08: 20:00:00 10 mL via INTRAVENOUS

## 2018-01-07 MED ORDER — SODIUM CHLORIDE 0.9 % IV SOLN
0.9 % | INTRAVENOUS | Status: DC
Start: 2018-01-07 — End: 2018-01-10
  Administered 2018-01-07 – 2018-01-09 (×7): via INTRAVENOUS

## 2018-01-07 MED ORDER — SODIUM CHLORIDE 0.9 % IV SOLN
0.9 % | Freq: Once | INTRAVENOUS | Status: AC
Start: 2018-01-07 — End: 2018-01-07
  Administered 2018-01-07: 14:00:00 80 mg via INTRAVENOUS

## 2018-01-07 MED ORDER — PANTOPRAZOLE SODIUM 40 MG IV SOLR
40 MG | Freq: Every day | INTRAVENOUS | Status: DC
Start: 2018-01-07 — End: 2018-01-07

## 2018-01-07 MED ORDER — IRON SUCROSE 20 MG/ML IV SOLN
20 MG/ML | Freq: Every day | INTRAVENOUS | Status: AC
Start: 2018-01-07 — End: 2018-01-10
  Administered 2018-01-07 – 2018-01-10 (×4): 200 mg via INTRAVENOUS

## 2018-01-07 MED ORDER — SODIUM CHLORIDE 0.9 % IJ SOLN
0.9 % | Freq: Every day | INTRAMUSCULAR | Status: DC
Start: 2018-01-07 — End: 2018-01-07

## 2018-01-07 MED ORDER — PAROXETINE HCL 20 MG PO TABS
20 MG | Freq: Every day | ORAL | Status: DC
Start: 2018-01-07 — End: 2018-01-10
  Administered 2018-01-07 – 2018-01-10 (×4): 40 mg via ORAL

## 2018-01-07 MED ORDER — SODIUM CHLORIDE 0.9 % IV SOLN
0.9 % | INTRAVENOUS | Status: DC
Start: 2018-01-07 — End: 2018-01-10
  Administered 2018-01-07 – 2018-01-10 (×7): 8 mg/h via INTRAVENOUS

## 2018-01-07 MED ORDER — NADOLOL 20 MG PO TABS
20 MG | Freq: Every evening | ORAL | Status: DC
Start: 2018-01-07 — End: 2018-01-10
  Administered 2018-01-08 – 2018-01-10 (×3): 10 mg via ORAL

## 2018-01-07 MED ORDER — TRAZODONE HCL 50 MG PO TABS
50 MG | Freq: Every evening | ORAL | Status: DC | PRN
Start: 2018-01-07 — End: 2018-01-10
  Administered 2018-01-08 – 2018-01-10 (×3): 50 mg via ORAL

## 2018-01-07 MED FILL — MORPHINE SULFATE 2 MG/ML IJ SOLN: 2 mg/mL | INTRAMUSCULAR | Qty: 2

## 2018-01-07 MED FILL — PROTONIX 40 MG IV SOLR: 40 mg | INTRAVENOUS | Qty: 80

## 2018-01-07 MED FILL — PAROXETINE HCL 20 MG PO TABS: 20 mg | ORAL | Qty: 2

## 2018-01-07 MED FILL — VENOFER 20 MG/ML IV SOLN: 20 mg/mL | INTRAVENOUS | Qty: 10

## 2018-01-07 MED FILL — NADOLOL 20 MG PO TABS: 20 mg | ORAL | Qty: 0.5

## 2018-01-07 MED FILL — ONDANSETRON HCL 4 MG/2ML IJ SOLN: 4 MG/2ML | INTRAMUSCULAR | Qty: 2

## 2018-01-07 NOTE — Progress Notes (Signed)
4 Eyes Skin Assessment     The patient is being assess for  Admission    I agree that 2 RN's have performed a thorough Head to Toe Skin Assessment on the patient. ALL assessment sites listed below have been assessed.       Areas assessed by both nurses: Alle/Becca  [x]    Head, Face, and Ears   [x]    Shoulders, Back, and Chest  [x]    Arms, Elbows, and Hands   [x]    Coccyx, Sacrum, and IschIum  [x]    Legs, Feet, and Heels        Does the Patient have Skin Breakdown?  No         Braden Prevention initiated:  Yes   Wound Care Orders initiated:  No      WOC nurse consulted for Pressure Injury (Stage 3,4, Unstageable, DTI, NWPT, and Complex wounds), New and Established Ostomies:  No      Nurse 1 eSignature: Electronically signed by Arley Phenix, RN on 01/07/18 at 4:35 PM    **SHARE this note so that the co-signing nurse is able to place an eSignature**    Nurse 2 eSignature: Electronically signed by Audie Clear, RN on 01/07/18 at 6:09 PM

## 2018-01-07 NOTE — ED Provider Notes (Addendum)
[facility] EMERGENCY DEPARTMENT  eMERGENCY dEPARTMENT eNCOUnter      Pt Name: Linda [name]  MRN: [id number]  Birthdate [birth year]  Date of evaluation: 01/07/2018  Provider: Thayer Headings, MD    CHIEF COMPLAINT       Chief Complaint   Patient presents with   . Diarrhea     pt states "black diarrhea" present since 2:30am   . Emesis     emesis x15 since 2:30am; pt states emesis is "as black as my diarrhea"         CRITICAL CARE TIME   Total Critical Care time was 0 minutes, excluding separately reportable procedures.  There was a high probability of clinically significant/life threatening deterioration in the patient's condition which required my urgent intervention.        HISTORY OF PRESENT ILLNESS  (Location/Symptom, Timing/Onset, Context/Setting, Quality, Duration, Modifying Factors, Severity.)   [name] is a 72 y.o. female who presents to the emergency department complaining of stomach pain, vomiting, diarrhea. She states last night before going to bed she noticed a generalized bellyache. She states she tossed and turned and couldn't sleep. She states around 2 AM she began vomiting and having diarrhea. Her vomitus was black. Her stool was black and loose. There was no gross blood in her vomitus or stool. Patient status post right hip surgery 7 weeks ago. She states she does take 2 aspirin daily for her arthritis. She also takes Celebrex. She is on an iron supplement. She has no previous history of GI bleed, peptic ulcer disease, bleeding gastritis.      Nursing Notes were reviewed and I agree.    REVIEW OF SYSTEMS    (2-9 systems for level 4, 10 or more for level 5)     Gen.: No fever or chills.  ENT: No sore throat earache or nasal congestion. No nosebleeds.  Cardiovascular: No chest pain or palpitations.  Pulmonary: No shortness of breath or cough.  GI: Generalized abdominal pain, nonradiating. Nausea with hematemesis and melanotic stool. She states she's vomited multiple  times, probably at least 15 times. She's had multiple episodes of black liquid stool.  GU: No frequency urgency or dysuria.  Neuro: No dizziness or syncope.     Except as noted above the remainder of the review of systems was reviewed and negative.       PAST MEDICAL HISTORY     Past Medical History:   Diagnosis Date   . Bisphosphonate-associated osteonecrosis of the jaw (HCC)    . 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 Colon polyps     adenomatous    . Dyspepsia     nonulcerative   . Graves disease    . High cholesterol    . History of depression    . Hypertension    . 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 Migraine     recurrent, controlled with elavil   . Osteoarthritis of lumbar spine     Chunduri   . Other intestinal malabsorption 08/09/2017   . Postmenopausal    . Thoracic compression fracture (HCC)     T10 from a fall         SURGICAL HISTORY       Past Surgical History:   Procedure Laterality Date   . ARTERY BIOPSY Right 12-24-12  Temporal Artery biopsy right    . COLONOSCOPY  [id number]    dr Paulina Fusi   3 years   . COLONOSCOPY N/A 07/09/2017    COLONOSCOPY POLYPECTOMY SNARE performed by Rudell Cobb, MD at [facility] ENDOSCOPY   . COLONOSCOPY N/A 07/09/2017    COLONOSCOPY WITH BIOPSY performed by Rudell Cobb, MD at [facility] ENDOSCOPY   . ESOPHAGEAL MOTILITY STUDY N/A 08/27/2017    ESOPHAGEAL MANOMETRY performed by Rudell Cobb, MD at [facility] ENDOSCOPY   . FIXATION KYPHOPLASTY      t10   . HIP FRACTURE SURGERY Right 11/22/2017    RIGHT HIP GAMMA NAILING performed by Richardson Chiquito, MD at [facility] OR   . HIP SURGERY Right 11/22/2017    right hip gamma nailing   . ORIF DISTAL RADIUS FRACTURE Right 11/22/2017    OPEN REDUCTION INTERNAL FIXATION RIGHT RADIUS WITH C-ARM performed by Filbert Berthold, MD at [facility] OR   . SHOULDER SURGERY      left humerus fx 3 places   . UPPER GASTROINTESTINAL ENDOSCOPY  08/26/2014     dr Onalee Hua hess:negative   . UPPER GASTROINTESTINAL ENDOSCOPY N/A 08/08/2017    EGD ESOPHAGOGASTRODUODENOSCOPY performed by Rudell Cobb, MD at [facility] ENDOSCOPY   . WRIST FRACTURE SURGERY Right 11/22/2017         CURRENT MEDICATIONS       Previous Medications    CALCIUM CARBONATE-VITAMIN D (CALTRATE 600+D) 600-400 MG-UNIT CHEW    Take 1 tablet by mouth 2 times daily.    CELECOXIB (CELEBREX) 200 MG CAPSULE    TAKE 1 CAPSULE BY MOUTH DAILY    COBALAMINE COMBINATIONS (B12 FOLATE) 800-800 MCG CAPS    One a day indefinitely    DIVALPROEX (DEPAKOTE ER) 500 MG EXTENDED RELEASE TABLET    Take 1 tablet by mouth nightly    DOCUSATE SODIUM (COLACE, DULCOLAX) 100 MG CAPS    Take 100 mg by mouth 2 times daily    PANTOPRAZOLE (PROTONIX) 40 MG TABLET    Take 1 tablet by mouth every morning (before breakfast)    PAROXETINE (PAXIL) 40 MG TABLET    Take 1 tablet by mouth daily    POLYSACCHARIDE IRON COMPLEX 391.3 (180 FE) MG CAPS    One a day for six months    TRAZODONE (DESYREL) 50 MG TABLET    TAKE 1 TABLET BY MOUTH EVERY NIGHT AS NEEDED FOR SLEEP       ALLERGIES     Actonel [risedronate sodium]    FAMILY HISTORY       Family History   Problem Relation Age of Onset   . Migraines Other    . Diabetes Other    . Breast Cancer Sister    . Cancer Mother    . Cancer Father           SOCIAL HISTORY       Social History     Socioeconomic History   . Marital status: Married     Spouse name: None   . Number of children: 1   . Years of education: None   . Highest education level: None   Occupational History   . Occupation: secretary/bar tender   Social Needs   . Financial resource strain: None   . Food insecurity:     Worry: None     Inability: None   . Transportation needs:     Medical: None     Non-medical: None  Tobacco Use   . Smoking status: Current Some Day Smoker     Packs/day: 0.25     Years: 40.00     Pack years: 10.00     Last attempt to quit: 08/06/1997     Years since quitting: 20.4   . Smokeless tobacco: Never Used   . Tobacco  comment: passive exposure husband chain smokes   Substance and Sexual Activity   . Alcohol use: Yes     Alcohol/week: 0.0 oz     Comment: rarely   . Drug use: No   . Sexual activity: Not Currently   Lifestyle   . Physical activity:     Days per week: None     Minutes per session: None   . Stress: None   Relationships   . Social connections:     Talks on phone: None     Gets together: None     Attends religious service: None     Active member of club or organization: None     Attends meetings of clubs or organizations: None     Relationship status: None   . Intimate partner violence:     Fear of current or ex partner: None     Emotionally abused: None     Physically abused: None     Forced sexual activity: None   Other Topics Concern   . None   Social History Narrative   . None         PHYSICAL EXAM    (up to 7 for level 4, 8 or more for level 5)     ED Triage Vitals   BP Temp Temp Source Pulse Resp SpO2 Height Weight   01/07/18 0907 01/07/18 0905 01/07/18 0905 01/07/18 0905 01/07/18 0905 01/07/18 0905 01/07/18 0905 01/07/18 0905   137/87 98.2 F (36.8 C) Oral 64 18 97 % 5\' 4"  (1.626 m) 134 lb 14.7 oz (61.2 kg)       Gen.: An alert white female, thin body habitus, no acute distress.  Head: Atraumatic and normocephalic.  Eyes: Pale conjunctiva. Pupils equal round reactive. Extraocular movements are intact.  ENT: TMs are normal. Nose is clear. Oropharynx is moist without erythema.  Neck: Supple without adenopathy, nontender.  Heart: Regular rate and rhythm. No murmurs gallops noted.  Lungs: Breath sounds equal bilaterally and clear.  Abdomen: Soft, nondistended, some mild epigastric right upper quadrant tenderness. No guarding or rebound. No masses organomegaly.  Rectal exam: Good sphincter tone. Black liquid stool, no blood. No masses. Nontender.  Musculoskeletal: No lower extremity edema. No Tenderness. Intact symmetrical distal pulses.  Skin: Pallor. No cyanosis or diaphoresis. Well healed surgical scar over the  right lateral hip.  Neuro: Awake, alert, oriented. No focal motor deficits. Normal gait.  Mental status: Normal affect.      DIFFERENTIAL DIAGNOSIS   Differential includes but is not limited to esophagitis, gastritis, peptic ulcer disease, AV malformation, anemia, dehydration.      DIAGNOSTIC RESULTS     EKG: All EKG's are interpreted by Thayer Headings, MD in the absence of a cardiologist.      RADIOLOGY:   Non-plain film images such as CT, Ultrasound and MRI are read by the radiologist. Plain radiographic images are visualized and preliminarily interpreted byTIMOTHY Alain Honey, MD with the below findings:      Interpretation per the Radiologist below, if available at the time of this note:    CT ABDOMEN PELVIS WO CONTRAST Additional Contrast? None  Final Result   Large hiatal hernia.      Hepatic steatosis.      Cholelithiasis.               ED BEDSIDE ULTRASOUND:   Performed by ED Physician - none    LABS:  Labs Reviewed   CBC WITH AUTO DIFFERENTIAL - Abnormal; Notable for the following components:       Result Value    RBC 3.34 (*)     Hemoglobin 11.3 (*)     Hematocrit 33.1 (*)     All other components within normal limits    Narrative:     Performed at:  [facility]  [address] [place], [place] [address]   Phone [phone]   COMPREHENSIVE METABOLIC PANEL - Abnormal; Notable for the following components:    Glucose 131 (*)     BUN 38 (*)     Alkaline Phosphatase 206 (*)     ALT 8 (*)     AST 14 (*)     All other components within normal limits    Narrative:     Performed at:  [facility]  [address] [place], [place] [address]   Phone [phone]   PROTIME-INR - Abnormal; Notable for the following components:    Protime 15.3 (*)     INR 1.34 (*)     All other components within normal limits    Narrative:     Performed at:  [facility]  [address] [place], [place] [address]   Phone [phone]   BLOOD OCCULT STOOL DIAGNOSTIC - Abnormal; Notable for the following components:    Occult Blood Diagnostic   (*)     Value: Result: POSITIVE  Normal range: Negative      All other components within normal limits    Narrative:     ORDER#: [id number]                          ORDERED BY: Livio Ledwith  SOURCE: Stool Loose                        COLLECTED:  01/07/18 09:38  ANTIBIOTICS AT COLL.:                      RECEIVED :  01/07/18 09:51  Performed at:  [facility]  [address] [place], [place] [address]   Phone [phone]   LIPASE    Narrative:     Performed at:  [facility]  [address] [place], [place] [address]   Phone [phone]   TYPE AND SCREEN    Narrative:     Performed at:  [facility]  [address] [place], [place] [address]   Phone [phone]       All other labs were within normal range or not returned as of this dictation.    EMERGENCY DEPARTMENT COURSE and DIFFERENTIAL DIAGNOSIS/MDM:   Vitals:    Vitals:    01/07/18 1016 01/07/18 1032 01/07/18 1101 01/07/18 1112   BP: 118/64 (!) 111/98 127/65    Pulse: 56 59 61 59   Resp: 16 16 16 15    Temp:  TempSrc:       SpO2: 96% 96% 94% 100%   Weight:       Height:           This patient had some abdominal discomfort that started yesterday evening and during the night around 2 AM developed coffee-ground emesis and black liquid stool. She's had no previous history of upper GI bleed. She does take 2 aspirin daily along with Celebrex. Hgb is 11.3 compared to a recent hemoglobin on 12/24/17 at 13.5. Her BUN is elevated. Her stool is black in color with no gross blood, Hemoccult positive. CT scan shows no acute intra-abdominal abnormality. Her vital signs are stable, she is not tachycardic, she is not hypotensive. Fluids were initiated, IV Protonix bolus and drip were initiated. Her clinical presentation is consistent with an upper GI  bleed. She stable at this point in time. She has seen Dr. Paulina Fusi in the past. He will be notified of the consult.    1145:  Discussed with Dr. Eliseo Squires. She will place admitting orders.    1158:  Discussed with Dr. Paulina Fusi.    CONSULTS:  IP CONSULT TO INTERNAL MEDICINE  IP CONSULT TO GI    PROCEDURES:  None    FINAL IMPRESSION      1. UGI bleed          DISPOSITION/PLAN   DISPOSITION Decision To Admit 01/07/2018 11:42:02 AM      PATIENT REFERRED TO:  No follow-up provider specified.    DISCHARGE MEDICATIONS:  New Prescriptions    No medications on file       (Please note that portions of this note were completed with a voice recognition program.  Efforts were made to edit the dictations but occasionally words are mis-transcribed.)    Shemiah Rosch Alain Honey, MD  Attending Emergency Physician       Thayer Headings, MD  01/07/18 1157       Thayer Headings, MD  01/07/18 2244

## 2018-01-07 NOTE — ED Notes (Signed)
Report called to [facility]. Pain discussed at handoff.      Raynelle Highland, RN  01/07/18 1540

## 2018-01-07 NOTE — Progress Notes (Signed)
Medication Reconciliation    List of medications patient is currently taking is complete.     Source of information: 1. Conversation with patient at bedside                                      2. EPIC records      Allergies  Actonel [risedronate sodium]     Notes regarding home medications:   1. Patient did not take any of her home medications prior to arrival to the emergency department today.    Christena Flake, PharmD, BCPS  01/07/2018 12:19 PM

## 2018-01-07 NOTE — Consults (Signed)
[facility]   GI Consult Note      Reason for Consult:  Vomited blood  Requesting Physician:  Garen Grams    CHIEF COMPLAINT:  Vomiting.    History Obtained From:  patient    HISTORY OF PRESENT ILLNESS:                The patient is a 72 y.o. female with significant past medical history of colon polyps.She is admitted with hematemesis and melena.Herbloodcount has dropped two grams.She is on NSAID's.She is hemodynamically stable when seen in the ER by myself.     Past Medical History:        Diagnosis Date   ??? Bisphosphonate-associated osteonecrosis of the jaw [facility])    ??? Celiac artery atherosclerosis 2015    60% proximal stenosis   ??? Colon polyps     adenomatous    ??? Dyspepsia     nonulcerative   ??? Graves disease    ??? High cholesterol    ??? History of depression    ??? Hypertension    ??? 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 Migraine     recurrent, controlled with elavil   ??? Osteoarthritis of lumbar spine     Chunduri   ??? 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 Postmenopausal    ??? Thoracic compression fracture ([place])     T10 from a fall     Past Surgical History:        Procedure Laterality Date   ??? ARTERY BIOPSY Right 12-24-12    Temporal Artery biopsy right    ??? COLONOSCOPY  [id number]    dr Awanda Mink   3 years   ??? COLONOSCOPY N/A 07/09/2017    COLONOSCOPY POLYPECTOMY SNARE performed by Orvan Seen, MD at [place]   ??? COLONOSCOPY N/A 07/09/2017    COLONOSCOPY WITH BIOPSY performed by Orvan Seen, MD at [place]   ??? ESOPHAGEAL MOTILITY STUDY N/A 08/27/2017    ESOPHAGEAL MANOMETRY performed by Orvan Seen, MD at [place]   ??? FIXATION KYPHOPLASTY      t10   ??? HIP FRACTURE SURGERY Right 11/22/2017    RIGHT HIP GAMMA NAILING performed by Arville Lime, MD at [place]   ??? HIP SURGERY Right 11/22/2017    right hip gamma nailing   ??? ORIF DISTAL RADIUS FRACTURE Right 11/22/2017    OPEN REDUCTION INTERNAL FIXATION  RIGHT RADIUS WITH C-ARM performed by Vevelyn Francois, MD at [place]   ??? SHOULDER SURGERY      left humerus fx 3 places   ??? UPPER GASTROINTESTINAL ENDOSCOPY  08/26/2014    dr Shanon Brow Nadalie Laughner:negative   ??? UPPER GASTROINTESTINAL ENDOSCOPY N/A 08/08/2017    EGD ESOPHAGOGASTRODUODENOSCOPY performed by Orvan Seen, MD at [place]   ??? WRIST FRACTURE SURGERY Right 11/22/2017     Current Medications:    Current Facility-Administered Medications: pantoprazole (PROTONIX) 80 mg in sodium chloride 0.9 % 100 mL infusion, 8 mg/hr, Intravenous, Continuous  Allergies:  Actonel [risedronate sodium]    Social History:    Social History     Socioeconomic History   ??? Marital status: Married     Spouse name: Not on file   ??? Number of children: 1   ??? Years of education: Not on file   ??? Highest education level: Not on  file   Occupational History   ??? Occupation: secretary/bar tender   Social Needs   ??? Financial resource strain: Not on file   ??? Food insecurity:     Worry: Not on file     Inability: Not on file   ??? Transportation needs:     Medical: Not on file     Non-medical: Not on file   Tobacco Use   ??? Smoking status: Current Some Day Smoker     Packs/day: 0.25     Years: 40.00     Pack years: 10.00     Last attempt to quit: 08/06/1997     Years since quitting: 20.4   ??? Smokeless tobacco: Never Used   ??? Tobacco comment: passive exposure husband chain smokes   Substance and Sexual Activity   ??? Alcohol use: Yes     Alcohol/week: 0.0 oz     Comment: rarely   ??? Drug use: No   ??? Sexual activity: Not Currently   Lifestyle   ??? Physical activity:     Days per week: Not on file     Minutes per session: Not on file   ??? Stress: Not on file   Relationships   ??? Social connections:     Talks on phone: Not on file     Gets together: Not on file     Attends religious service: Not on file     Active member of club or organization: Not on file     Attends meetings of clubs or organizations: Not on file     Relationship status: Not on file   ??? Intimate  partner violence:     Fear of current or ex partner: Not on file     Emotionally abused: Not on file     Physically abused: Not on file     Forced sexual activity: Not on file   Other Topics Concern   ??? Not on file   Social History Narrative   ??? Not on file       Family History:       Problem Relation Age of Onset   ??? Migraines Other    ??? Diabetes Other    ??? Breast Cancer Sister    ??? Cancer Mother    ??? Cancer Father      REVIEW OF SYSTEMS:    CONSTITUTIONAL:  negative  EYES:  negative  HEENT:  negative  RESPIRATORY:  negative  CARDIOVASCULAR:  negative  GASTROINTESTINAL:  negative  INTEGUMENT/BREAST:  negative  HEMATOLOGIC/LYMPHATIC:  negative  ENDOCRINE:  negative  MUSCULOSKELETAL:  negative  NEUROLOGICAL:  negative  PHYSICAL EXAM:    General Appearance: alert and oriented to person, place and time, well developed and well- nourished, in no acute distress  Skin: warm and dry, no rash or erythema  Head: normocephalic and atraumatic  Eyes: pupils equal, round, and reactive to light, extraocular eye movements intact, conjunctivae normal  ENT: tympanic membrane, external ear and ear canal normal bilaterally, nose without deformity, nasal mucosa and turbinates normal without polyps  Neck: supple and non-tender without mass, no thyromegaly or thyroid nodules, no cervical lymphadenopathy  Pulmonary/Chest: clear to auscultation bilaterally- no wheezes, rales or rhonchi, normal air movement, no respiratory distress  Cardiovascular: normal rate, regular rhythm, normal S1 and S2, no murmurs, rubs, clicks, or gallops, distal pulses intact, no carotid bruits  Abdomen: soft, non-tender, non-distended, normal bowel sounds, no masses or organomegaly  Extremities: no cyanosis, clubbing or edema  Musculoskeletal:  normal range of motion, no joint swelling, deformity or tenderness  Neurologic: reflexes normal and symmetric, no cranial nerve deficit, gait, coordination and speech normal  Vitals:    BP (!) 125/47    Pulse 62    Temp  98.2 ??F (36.8 ??C) (Oral)    Resp 17    Ht 5\' 4"  (1.626 m)    Wt 134 lb 14.7 oz (61.2 kg)    SpO2 93%    BMI 23.16 kg/m??     DATA:    CBC with Differential:    Lab Results   Component Value Date    WBC 7.7 01/07/2018    RBC 3.34 01/07/2018    HGB 11.3 01/07/2018    HCT 33.1 01/07/2018    PLT 193 01/07/2018    MCV 99.1 01/07/2018    MCH 33.8 01/07/2018    MCHC 34.1 01/07/2018    RDW 15.3 01/07/2018    SEGSPCT 41.6 05/12/2010    LYMPHOPCT 21.7 01/07/2018    MONOPCT 4.3 01/07/2018    EOSPCT 3.5 05/12/2010    BASOPCT 0.2 01/07/2018    MONOSABS 0.3 01/07/2018    LYMPHSABS 1.7 01/07/2018    EOSABS 0.0 01/07/2018    BASOSABS 0.0 01/07/2018    DIFFTYPE Auto-K 05/12/2010     CMP:    Lab Results   Component Value Date    NA 138 01/07/2018    K 4.4 01/07/2018    K 3.7 11/21/2017    CL 102 01/07/2018    CO2 28 01/07/2018    BUN 38 01/07/2018    CREATININE 0.6 01/07/2018    GFRAA >60 01/07/2018    GFRAA >60 05/12/2010    AGRATIO 1.3 01/07/2018    LABGLOM >60 01/07/2018    GLUCOSE 131 01/07/2018    PROT 7.0 01/07/2018    PROT 7.8 05/12/2010    LABALBU 3.9 01/07/2018    CALCIUM 9.0 01/07/2018    BILITOT 0.5 01/07/2018    ALKPHOS 206 01/07/2018    AST 14 01/07/2018    ALT 8 01/07/2018       IMPRESSION/RECOMMENDATIONS:      Hematemesis with melena.She will be scheduled for an EGD on Wednesday 01/08/18.  Electronically signed by Orvan Seen, MD on 01/07/2018 at 1:15 PM

## 2018-01-08 ENCOUNTER — Inpatient Hospital Stay: Admit: 2018-01-08 | Payer: MEDICARE | Primary: Internal Medicine

## 2018-01-08 ENCOUNTER — Inpatient Hospital Stay: Admit: 2018-01-08 | Primary: Internal Medicine

## 2018-01-08 LAB — CBC WITH AUTO DIFFERENTIAL
Basophils %: 0.1 %
Basophils Absolute: 0 10*3/uL (ref 0.0–0.2)
Eosinophils %: 1.7 %
Eosinophils Absolute: 0.1 10*3/uL (ref 0.0–0.6)
Hematocrit: 25.4 % — ABNORMAL LOW (ref 36.0–48.0)
Hemoglobin: 8.3 g/dL — ABNORMAL LOW (ref 12.0–16.0)
Lymphocytes %: 36.1 %
Lymphocytes Absolute: 2.2 10*3/uL (ref 1.0–5.1)
MCH: 32.4 pg (ref 26.0–34.0)
MCHC: 32.5 g/dL (ref 31.0–36.0)
MCV: 99.9 fL (ref 80.0–100.0)
MPV: 8.3 fL (ref 5.0–10.5)
Monocytes %: 4 %
Monocytes Absolute: 0.2 10*3/uL (ref 0.0–1.3)
Neutrophils %: 58.1 %
Neutrophils Absolute: 3.6 10*3/uL (ref 1.7–7.7)
Platelets: 142 10*3/uL (ref 135–450)
RBC: 2.55 M/uL — ABNORMAL LOW (ref 4.00–5.20)
RDW: 15.2 % (ref 12.4–15.4)
WBC: 6.2 10*3/uL (ref 4.0–11.0)

## 2018-01-08 LAB — HEMOGLOBIN
Hemoglobin: 8.6 g/dL — ABNORMAL LOW (ref 12.0–16.0)
Hemoglobin: 8 g/dL — ABNORMAL LOW (ref 12.0–16.0)
Hemoglobin: 7.9 g/dL — ABNORMAL LOW (ref 12.0–16.0)

## 2018-01-08 LAB — C DIFF TOXIN/ANTIGEN: C difficile Toxin, EIA: NEGATIVE

## 2018-01-08 MED ORDER — NORMAL SALINE FLUSH 0.9 % IV SOLN
0.9 % | Freq: Two times a day (BID) | INTRAVENOUS | Status: DC
Start: 2018-01-08 — End: 2018-01-08

## 2018-01-08 MED ORDER — PROPOFOL 200 MG/20ML IV EMUL
200 | INTRAVENOUS | Status: AC
Start: 2018-01-08 — End: 2018-01-08

## 2018-01-08 MED ORDER — MORPHINE SULFATE (PF) 2 MG/ML IV SOLN
2 MG/ML | INTRAVENOUS | Status: DC | PRN
Start: 2018-01-08 — End: 2018-01-08

## 2018-01-08 MED ORDER — FENTANYL CITRATE (PF) 100 MCG/2ML IJ SOLN
100 MCG/2ML | INTRAMUSCULAR | Status: DC | PRN
Start: 2018-01-08 — End: 2018-01-08

## 2018-01-08 MED ORDER — PROPOFOL 200 MG/20ML IV EMUL
200 MG/20ML | INTRAVENOUS | Status: DC | PRN
Start: 2018-01-08 — End: 2018-01-08
  Administered 2018-01-08: 18:00:00 110 via INTRAVENOUS
  Administered 2018-01-08: 18:00:00 100 via INTRAVENOUS

## 2018-01-08 MED ORDER — OXYCODONE-ACETAMINOPHEN 5-325 MG PO TABS
5-325 MG | ORAL | Status: DC | PRN
Start: 2018-01-08 — End: 2018-01-08

## 2018-01-08 MED ORDER — FAMOTIDINE 20 MG/2ML IV SOLN
20 MG/2ML | Freq: Once | INTRAVENOUS | Status: AC
Start: 2018-01-08 — End: 2018-01-08
  Administered 2018-01-08: 20:00:00 20 mg via INTRAVENOUS

## 2018-01-08 MED ORDER — BUTALBITAL-APAP-CAFFEINE 50-325-40 MG PO TABS
50-325-40 MG | Freq: Once | ORAL | Status: AC
Start: 2018-01-08 — End: 2018-01-08
  Administered 2018-01-08: 13:00:00 1 via ORAL

## 2018-01-08 MED ORDER — NORMAL SALINE FLUSH 0.9 % IV SOLN
0.9 % | INTRAVENOUS | Status: DC | PRN
Start: 2018-01-08 — End: 2018-01-08

## 2018-01-08 MED ORDER — MEPERIDINE HCL 25 MG/ML IJ SOLN
25 MG/ML | INTRAMUSCULAR | Status: DC | PRN
Start: 2018-01-08 — End: 2018-01-08

## 2018-01-08 MED ORDER — ONDANSETRON HCL 4 MG/2ML IJ SOLN
4 MG/2ML | Freq: Once | INTRAMUSCULAR | Status: DC | PRN
Start: 2018-01-08 — End: 2018-01-08

## 2018-01-08 MED ORDER — LIDOCAINE HCL (PF) 2 % IJ SOLN
2 | INTRAMUSCULAR | Status: AC
Start: 2018-01-08 — End: 2018-01-08

## 2018-01-08 MED ORDER — SODIUM CHLORIDE 0.9 % IV SOLN
0.9 % | INTRAVENOUS | Status: DC
Start: 2018-01-08 — End: 2018-01-10

## 2018-01-08 MED ORDER — LIDOCAINE HCL (PF) 2 % IJ SOLN
2 % | INTRAMUSCULAR | Status: DC | PRN
Start: 2018-01-08 — End: 2018-01-08
  Administered 2018-01-08: 18:00:00 100 via INTRAVENOUS

## 2018-01-08 MED ORDER — SODIUM CHLORIDE 0.9 % IV SOLN
0.9 % | INTRAVENOUS | Status: DC
Start: 2018-01-08 — End: 2018-01-08
  Administered 2018-01-08: 18:00:00 via INTRAVENOUS

## 2018-01-08 MED FILL — TRAZODONE HCL 50 MG PO TABS: 50 mg | ORAL | Qty: 1

## 2018-01-08 MED FILL — NADOLOL 20 MG PO TABS: 20 mg | ORAL | Qty: 0.5

## 2018-01-08 MED FILL — FAMOTIDINE 20 MG/2ML IV SOLN: 20 MG/2ML | INTRAVENOUS | Qty: 2

## 2018-01-08 MED FILL — PROTONIX 40 MG IV SOLR: 40 mg | INTRAVENOUS | Qty: 80

## 2018-01-08 MED FILL — PAROXETINE HCL 20 MG PO TABS: 20 mg | ORAL | Qty: 2

## 2018-01-08 MED FILL — XYLOCAINE-MPF 2 % IJ SOLN: 2 % | INTRAMUSCULAR | Qty: 5

## 2018-01-08 MED FILL — DIVALPROEX SODIUM ER 500 MG PO TB24: 500 mg | ORAL | Qty: 1

## 2018-01-08 MED FILL — DIPRIVAN 200 MG/20ML IV EMUL: 200 MG/20ML | INTRAVENOUS | Qty: 40

## 2018-01-08 MED FILL — BUTALBITAL-APAP-CAFFEINE 50-325-40 MG PO TABS: 50-325-40 mg | ORAL | Qty: 1

## 2018-01-08 MED FILL — ONDANSETRON HCL 4 MG/2ML IJ SOLN: 4 MG/2ML | INTRAMUSCULAR | Qty: 2

## 2018-01-08 MED FILL — VENOFER 20 MG/ML IV SOLN: 20 mg/mL | INTRAVENOUS | Qty: 10

## 2018-01-08 NOTE — H&P (Signed)
Internal Medicine History and Physical  QI:HKVQQ stool  HPI:71 yo female with a history of camerons ulcers and severe hiatal hernia , recent hip fx and wrist fx and recurrent migraines developed severe abdominal pain and inability to sleep secondary to the pain.  She got up and once in the bathroom had a large amount of coffee ground emesis followed by profuse amounts of black watery stool that lasted through the night.  She presented to the ER the following morning tachycardic and weak.  Her hb had not dropped much on presentation but did drop overnight.  She has continued to have black watery stool since here.  She also has developed a migraine since receiving a shot of morphine for her abdominal pain.    Recently she has been taking more nsaids because of pain in her right knee that has started up in the last few weeks.  She was supposed to have a nissan fundoplication due to hiatal hernia and multiple cammeron ulcers but she never went through with that.  In addition she has had recently diagnosed graves disease which has since cooled down  And is not on anything now.      Patient Active Problem List    Diagnosis Date Noted   ??? Hyperlipidemia      Priority: High   ??? Acute pain of right knee 01/08/2018   ??? UGI bleed 01/07/2018   ??? Closed fracture of intertrochanteric section of femur, right, sequela 11/24/2017   ??? Closed fracture of right distal radius    ??? Closed intertrochanteric fracture of hip, right, initial encounter ([place]) 11/21/2017   ??? Closed fracture of right wrist 11/21/2017   ??? Graves disease 09/17/2017   ??? 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 Intractable chronic migraine without aura 02/27/2013   ??? Medication overuse headache 02/27/2013   ??? Intractable headache 12/23/2012   ??? Hypoxemia 12/23/2012   ??? Osteoporosis 12/23/2012   ??? Depression 07/25/2010   ??? Senile osteoporosis    ??? Menopausal and  postmenopausal disorder    ??? GERD (gastroesophageal reflux disease)    ??? Migraine headache    ??? Colon polyps      Past Medical History:   Diagnosis Date   ??? Bisphosphonate-associated osteonecrosis of the jaw [facility])    ??? Celiac artery atherosclerosis 2015    60% proximal stenosis   ??? Colon polyps     adenomatous    ??? Dyspepsia     nonulcerative   ??? Graves disease    ??? High cholesterol    ??? History of depression    ??? Hypertension    ??? 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 Migraine     recurrent, controlled with elavil   ??? Osteoarthritis of lumbar spine     Chunduri   ??? 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 Postmenopausal    ??? Thoracic compression fracture ([place])     T10 from a fall      Past Surgical History:   Procedure Laterality Date   ??? ARTERY BIOPSY Right 12-24-12    Temporal Artery biopsy right    ??? COLONOSCOPY  [id number]    dr Awanda Mink   3 years   ??? COLONOSCOPY N/A 07/09/2017    COLONOSCOPY POLYPECTOMY SNARE performed by Orvan Seen, MD at  [facility]   ??? COLONOSCOPY N/A 07/09/2017    COLONOSCOPY WITH BIOPSY performed by Orvan Seen, MD at [place]   ??? ESOPHAGEAL MOTILITY STUDY N/A 08/27/2017    ESOPHAGEAL MANOMETRY performed by Orvan Seen, MD at [place]   ??? FIXATION KYPHOPLASTY      t10   ??? HIP FRACTURE SURGERY Right 11/22/2017    RIGHT HIP GAMMA NAILING performed by Arville Lime, MD at [place]   ??? HIP SURGERY Right 11/22/2017    right hip gamma nailing   ??? ORIF DISTAL RADIUS FRACTURE Right 11/22/2017    OPEN REDUCTION INTERNAL FIXATION RIGHT RADIUS WITH C-ARM performed by Vevelyn Francois, MD at [place]   ??? SHOULDER SURGERY      left humerus fx 3 places   ??? UPPER GASTROINTESTINAL ENDOSCOPY  08/26/2014    dr Shanon Brow hess:negative   ??? UPPER GASTROINTESTINAL ENDOSCOPY N/A 08/08/2017    EGD ESOPHAGOGASTRODUODENOSCOPY performed by Orvan Seen, MD at [place]   ??? UPPER GASTROINTESTINAL  ENDOSCOPY N/A 01/08/2018    EGD BIOPSY performed by Orvan Seen, MD at [place]   ??? WRIST FRACTURE SURGERY Right 11/22/2017      Medications Prior to Admission: nadolol (CORGARD) 20 MG tablet, Take 10 mg by mouth nightly  celecoxib (CELEBREX) 200 MG capsule, TAKE 1 CAPSULE BY MOUTH DAILY  PARoxetine (PAXIL) 40 MG tablet, Take 1 tablet by mouth daily  traZODone (DESYREL) 50 MG tablet, TAKE 1 TABLET BY MOUTH EVERY NIGHT AS NEEDED FOR SLEEP  docusate sodium (COLACE, DULCOLAX) 100 MG CAPS, Take 100 mg by mouth 2 times daily  pantoprazole (PROTONIX) 40 MG tablet, Take 1 tablet by mouth every morning (before breakfast)  divalproex (DEPAKOTE ER) 500 MG extended release tablet, Take 1 tablet by mouth nightly  Polysaccharide Iron Complex 391.3 (180 Fe) MG CAPS, One a day for six months  Cobalamine Combinations (B12 FOLATE) 800-800 MCG CAPS, One a day indefinitely  Calcium Carbonate-Vitamin D (CALTRATE 600+D) 600-400 MG-UNIT CHEW, Take 1 tablet by mouth 2 times daily.  Allergies   Allergen Reactions   ??? Actonel [Risedronate Sodium]      Osteonecrosis jaw documented      Social History     Tobacco Use   ??? Smoking status: Former Smoker     Packs/day: 0.00     Years: 40.00     Pack years: 0.00     Last attempt to quit: 08/06/1997     Years since quitting: 20.4   ??? Smokeless tobacco: Never Used   ??? Tobacco comment: passive exposure husband chain smokes   Substance Use Topics   ??? Alcohol use: Yes     Alcohol/week: 0.0 oz     Comment: rarely, socially      Family History   Problem Relation Age of Onset   ??? Migraines Other    ??? Diabetes Other    ??? Breast Cancer Sister    ??? Cancer Mother    ??? Cancer Father         Prior to Admission medications    Medication Sig Start Date End Date Taking? Authorizing Provider   nadolol (CORGARD) 20 MG tablet Take 10 mg by mouth nightly   Yes Historical Provider, MD   celecoxib (CELEBREX) 200 MG capsule TAKE 1 CAPSULE BY MOUTH DAILY 01/01/18  Yes Angelica Pou, MD   PARoxetine (PAXIL) 40 MG tablet  Take 1 tablet by mouth daily 12/24/17  Yes  Angelica Pou, MD   traZODone (DESYREL) 50 MG tablet TAKE 1 TABLET BY MOUTH EVERY NIGHT AS NEEDED FOR SLEEP 12/24/17  Yes Angelica Pou, MD   docusate sodium (COLACE, DULCOLAX) 100 MG CAPS Take 100 mg by mouth 2 times daily 11/24/17  Yes Zacarias Pontes, MD   pantoprazole (PROTONIX) 40 MG tablet Take 1 tablet by mouth every morning (before breakfast) 11/25/17  Yes Zacarias Pontes, MD   divalproex (DEPAKOTE ER) 500 MG extended release tablet Take 1 tablet by mouth nightly 11/24/17  Yes Zacarias Pontes, MD   Polysaccharide Iron Complex 391.3 (180 Fe) MG CAPS One a day for six months 08/02/17  Yes Angelica Pou, MD   Cobalamine Combinations (B12 FOLATE) 800-800 MCG CAPS One a day indefinitely 06/24/17  Yes Angelica Pou, MD   Calcium Carbonate-Vitamin D (CALTRATE 600+D) 600-400 MG-UNIT CHEW Take 1 tablet by mouth 2 times daily. 01/16/10  Yes Angelica Pou, MD     Review of Systems  A comprehensive review of systems was negative except for: black diarrhea and emesis    Objective:     Patient Vitals for the past 8 hrs:   BP Temp Temp src Pulse Resp SpO2   01/08/18 1620 134/61 98.4 ??F (36.9 ??C) Oral 67 16 95 %   01/08/18 1502 (!) 122/51 -- -- (!) 48 16 94 %   01/08/18 1453 120/74 -- -- 52 16 97 %   01/08/18 1449 131/66 -- -- 52 19 94 %   01/08/18 1443 (!) 132/57 -- -- 50 19 97 %   01/08/18 1437 130/66 -- -- 53 20 100 %   01/08/18 1435 (!) 128/58 -- -- 55 19 100 %   01/08/18 1432 (!) 128/58 98.6 ??F (37 ??C) Temporal 53 21 100 %   01/08/18 1200 (!) 128/54 98.4 ??F (36.9 ??C) Oral (!) 47 16 93 %     I/O last 3 completed shifts:  In: 1110 [P.O.:560; I.V.:550]  Out: 3150 [Urine:3150]  I/O this shift:  In: -   Out: 550 [Urine:550]        General Appearance: alert and oriented to person, place and time, well-developed and well-nourished, in no acute distress  Skin: warm and dry, no rash or erythema  Head: normocephalic and atraumatic  Eyes: pupils equal, round, and reactive to light, extraocular eye movements  intact, conjunctivae normal  ENT: tympanic membrane, external ear and ear canal normal bilaterally, oropharynx clear and moist with normal mucous membranes  Neck: neck supple and non tender without mass, no thyromegaly or thyroid nodules, no cervical lymphadenopathy   Pulmonary/Chest: clear to auscultation bilaterally- no wheezes, rales or rhonchi, normal air movement, no respiratory distress  Cardiovascular: normal rate, regular rhythm and normal S1 and S2  Abdomen: pos bs tender epigastric and RUQ area  Breast: not examined  Extremities: no cyanosis, clubbing or edema  Musculoskeletal: normal range of motion, no joint swelling, deformity or tenderness  Neurologic: gait and coordination normal and speech normal    ECG: no new ekg.   Data Review:      CBC with Differential:    Lab Results   Component Value Date    WBC 6.2 01/08/2018    RBC 2.55 01/08/2018    HGB 8.0 01/08/2018    HCT 25.4 01/08/2018    PLT 142 01/08/2018    MCV 99.9 01/08/2018    MCH 32.4 01/08/2018    MCHC 32.5 01/08/2018    RDW 15.2 01/08/2018    SEGSPCT 41.6  05/12/2010    LYMPHOPCT 36.1 01/08/2018    MONOPCT 4.0 01/08/2018    EOSPCT 3.5 05/12/2010    BASOPCT 0.1 01/08/2018    MONOSABS 0.2 01/08/2018    LYMPHSABS 2.2 01/08/2018    EOSABS 0.1 01/08/2018    BASOSABS 0.0 01/08/2018    DIFFTYPE Auto-K 05/12/2010     CMP:    Lab Results   Component Value Date    NA 138 01/07/2018    K 4.4 01/07/2018    K 3.7 11/21/2017    CL 102 01/07/2018    CO2 28 01/07/2018    BUN 38 01/07/2018    CREATININE 0.6 01/07/2018    GFRAA >60 01/07/2018    GFRAA >60 05/12/2010    AGRATIO 1.3 01/07/2018    LABGLOM >60 01/07/2018    GLUCOSE 131 01/07/2018    PROT 7.0 01/07/2018    PROT 7.8 05/12/2010    LABALBU 3.9 01/07/2018    CALCIUM 9.0 01/07/2018    BILITOT 0.5 01/07/2018    ALKPHOS 206 01/07/2018    AST 14 01/07/2018    ALT 8 01/07/2018     PT/INR:    Lab Results   Component Value Date    PROTIME 15.3 01/07/2018    INR 1.34 01/07/2018     TSH:    Lab Results    Component Value Date    TSH 18.84 12/24/2017             Assessment:     Patient Active Hospital Problem List:   UGI bleed (01/07/2018)    Assessment: most likely new gastric ulcer secondary to nsaid    Plan: close monitoring, fluid replacement and egd   Migraine headache ()    Assessment: likely due to morphine    Plan: fioricet x 1 since tryptans dont help her   Iron deficiency anemia secondary to blood loss (chronic) (08/09/2017)    Assessment: ongoing    Plan: replace   Graves disease (09/17/2017)    Assessment: confusing picture, now looks more like hypothyroidism    Plan: will ask patient again to see endocrin as op   Acute pain of right knee (01/08/2018)    Assessment: etiology uncertain, ? If referred pain from her hip    Plan: ask ortho opinion              Angelica Pou, MD

## 2018-01-08 NOTE — Procedures (Signed)
[facility]  Endoscopy Note    Patient: Linda [name]  DOB: 11-Mar-1946  Acct#: [id number]    Procedure: Esophagogastroduodenoscopy with biopsy    Date:  01/08/2018     Surgeon:  Rudell Cobb, MD    Referring Physician:  Eliseo Squires    Preoperative Diagnosis:  GI bleeding    Postoperative Diagnosis:  Sheria Lang ulcers in large hiatal hernia.    Anesthesia: see anesthesia note.    Indications: This is a 72 y.o. year old female who presents today with Melena.      Description of Procedure:  Informed consent was obtained from the patient after explanation of indications, benefits and possible risks and complications of the procedure.  The patient was then taken to the endoscopy suite, placed in the left lateral decubitus position and the above IV sedation was administrered.    The Olympus videoendoscope was placed in the patient's mouth and under direct visualization passed into the esophagus.  Visualization of the esophagus demonstrated normal..     The scope was then advanced into the stomach.    Visualization of the gastric body and antrum demonstrated fresh blood in the stomach. The antrum was biopsied..  A retroflexed exam of the gastric cardia and fundus demonstrated a large hiatal hernia with fresh blood. There are Cameron ulcers as well..  The pylorus was patent and the scope was advanced into the duodenum.  Visualization of the duodenal bulb demonstrated normal..  The second portion of the duodenum demonstrated normal..    The scope was then withdrawn back into the stomach, it was decompressed, and the scope was completely withdrawn.    The patient tolerated the procedure well and was taken to the post anesthesia care unit in good condition.    Estimated Blood loss:  none    Impression: Sheria Lang ulcers in large hiatal hernia with fresh blood.      Recommendations:Await biopsy. Consider UGI and surgical consult for large hiatal hernia. Stop NSAID's.    Rudell Cobb, MD  [facility]

## 2018-01-08 NOTE — Plan of Care (Signed)
Problem: Falls - Risk of:  Goal: Will remain free from falls  Description  Will remain free from falls  Outcome: Ongoing   Fall risk assessment completed every shift. All precautions in place. Pt has call light within reach at all times. Room clear of clutter. Pt aware to call for assistance when getting up.      Problem: SAFETY  Goal: Free from accidental physical injury  Outcome: Ongoing   Patient assessed for fall risk; fall precautions initiated. Patient and family instructed about safety devices. Environment kept free of clutter and adequate lighting provided.  Bed locked and in lowest position.  Call light within reach. Will continue to monitor.     Problem: PAIN  Goal: Patient's pain/discomfort is manageable  Outcome: Ongoing   Pain/discomfort being managed with PRN analgesics per MD orders. Pt able to express presence and absence of pain and rate pain appropriately using numerical scale.

## 2018-01-08 NOTE — Consults (Signed)
[facility]  Consult Note    This patient is seen in consultation at the request of Dr Garen Grams    Reason for Consult:  Right knee pain    CHIEF COMPLAINT:  Right knee pain    History Obtained From:  patient, electronic medical record    HISTORY OF PRESENT ILLNESS:    The patient is a 72 y.o. female who presents with right knee pain. She is well know to me post ORIF right distal radius fx ([place]) and right IT hip fx Bridgett Larsson) on 11/22/17. She was DC to ECF then to home. She reported to Dr Bridgett Larsson she stopped PT on her own and did exercises at home. She states she continue to exercise leg at home. Now with admission to rule out GI bleed. She reports pain in in anterior knee only , worse on stairs, sharp and grabbing. Pain is none to severe she says. She is alert and oriented in bed at this time. No distress noted.     Past Medical History:        Diagnosis Date   ??? Bisphosphonate-associated osteonecrosis of the jaw [facility])    ??? Celiac artery atherosclerosis 2015    60% proximal stenosis   ??? Colon polyps     adenomatous    ??? Dyspepsia     nonulcerative   ??? Graves disease    ??? High cholesterol    ??? History of depression    ??? Hypertension    ??? 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 Migraine     recurrent, controlled with elavil   ??? Osteoarthritis of lumbar spine     Chunduri   ??? 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 Postmenopausal    ??? Thoracic compression fracture ([place])     T10 from a fall       Past Surgical History:        Procedure Laterality Date   ??? ARTERY BIOPSY Right 12-24-12    Temporal Artery biopsy right    ??? COLONOSCOPY  [id number]    dr Awanda Mink   3 years   ??? COLONOSCOPY N/A 07/09/2017    COLONOSCOPY POLYPECTOMY SNARE performed by Orvan Seen, MD at [place]   ??? COLONOSCOPY N/A 07/09/2017    COLONOSCOPY WITH BIOPSY performed by Orvan Seen, MD at [place]   ??? ESOPHAGEAL MOTILITY STUDY N/A  08/27/2017    ESOPHAGEAL MANOMETRY performed by Orvan Seen, MD at [place]   ??? FIXATION KYPHOPLASTY      t10   ??? HIP FRACTURE SURGERY Right 11/22/2017    RIGHT HIP GAMMA NAILING performed by Arville Lime, MD at [place]   ??? HIP SURGERY Right 11/22/2017    right hip gamma nailing   ??? ORIF DISTAL RADIUS FRACTURE Right 11/22/2017    OPEN REDUCTION INTERNAL FIXATION RIGHT RADIUS WITH C-ARM performed by Vevelyn Francois, MD at [place]   ??? SHOULDER SURGERY      left humerus fx 3 places   ??? UPPER GASTROINTESTINAL ENDOSCOPY  08/26/2014    dr Shanon Brow hess:negative   ??? UPPER GASTROINTESTINAL ENDOSCOPY N/A 08/08/2017    EGD ESOPHAGOGASTRODUODENOSCOPY performed by Orvan Seen, MD at [place]   ??? WRIST FRACTURE SURGERY Right 11/22/2017       Social History     Tobacco Use   ???  Smoking status: Former Smoker     Packs/day: 0.00     Years: 40.00     Pack years: 0.00     Last attempt to quit: 08/06/1997     Years since quitting: 20.4   ??? Smokeless tobacco: Never Used   ??? Tobacco comment: passive exposure husband chain smokes   Substance Use Topics   ??? Alcohol use: Yes     Alcohol/week: 0.0 oz     Comment: rarely, socially       Family History   Problem Relation Age of Onset   ??? Migraines Other    ??? Diabetes Other    ??? Breast Cancer Sister    ??? Cancer Mother    ??? Cancer Father            Current Medications:   Current Facility-Administered Medications: pantoprazole (PROTONIX) 80 mg in sodium chloride 0.9 % 100 mL infusion, 8 mg/hr, Intravenous, Continuous  divalproex (DEPAKOTE ER) extended release tablet 500 mg, 500 mg, Oral, Nightly  nadolol (CORGARD) tablet 10 mg, 10 mg, Oral, Nightly  PARoxetine (PAXIL) tablet 40 mg, 40 mg, Oral, Daily  traZODone (DESYREL) tablet 50 mg, 50 mg, Oral, Nightly PRN  sodium chloride flush 0.9 % injection 10 mL, 10 mL, Intravenous, 2 times per day  sodium chloride flush 0.9 % injection 10 mL, 10 mL, Intravenous, PRN  acetaminophen (TYLENOL) tablet 650 mg, 650 mg, Oral, Q4H PRN  ondansetron  (ZOFRAN) injection 4 mg, 4 mg, Intravenous, Q6H PRN  0.9 % sodium chloride infusion, , Intravenous, Continuous  iron sucrose (VENOFER) injection 200 mg, 200 mg, Intravenous, Daily  Allergies:  Actonel [risedronate sodium]    REVIEW OF SYSTEMS:    CONSTITUTIONAL:  negative for  fevers, chills and malaise  MUSCULOSKELETAL:  positive for  myalgias, arthralgias and pain  All other ROS reviewed in chart or with patient or family and are grossly negative.         PHYSICAL EXAM:    VITALS:  BP 97/67    Pulse 53    Temp 98.4 ??F (36.9 ??C) (Oral)    Resp 16    Ht 5\' 4"  (1.626 m)    Wt 138 lb 14.2 oz (63 kg)    SpO2 95%    BMI 23.84 kg/m??     MUSCULOSKELETAL:  right foot NVI. Wiggles toes to command. Pedal pulses are palpable. Right knee with no swelling erythema or warmth noted. FROM right knee noted. Nontender at right knee joint line. Painful to palpate right patella. Neg Lachman or McMurray tests. Wound at right hip clean and well healed.   NEUROLOGIC:   Sensory:    Touch:                                     Right Lower Extremity:  normal                  Left Lower Extremity:  normal  Skin warm and dry  Resp deep and easy  Abdomen soft and round  Pulse is with regular rate and rhythm    DATA:    CBC:   Lab Results   Component Value Date    WBC 6.2 01/08/2018    RBC 2.55 01/08/2018    HGB 8.3 01/08/2018    HCT 25.4 01/08/2018    MCV 99.9 01/08/2018    MCH 32.4 01/08/2018    MCHC 32.5  01/08/2018    RDW 15.2 01/08/2018    PLT 142 01/08/2018    MPV 8.3 01/08/2018     WBC:    Lab Results   Component Value Date    WBC 6.2 01/08/2018     PT/INR:    Lab Results   Component Value Date    PROTIME 15.3 01/07/2018    INR 1.34 01/07/2018     PTT:    Lab Results   Component Value Date    APTT 28.3 11/21/2017   [APTT  Radiology Review:    Narrative   EXAMINATION:   2 XRAY VIEWS OF THE RIGHT KNEE   ??   01/08/2018 10:30 am   ??   COMPARISON:   None.   ??   HISTORY:   ORDERING SYSTEM PROVIDED HISTORY: right knee pain   TECHNOLOGIST PROVIDED  HISTORY:   Reason for exam:->right knee pain   Ordering Physician Provided Reason for Exam: right knee pain   Acuity: Acute   Type of Exam: Initial   Mechanism of Injury: right knee pain after fall, hx of right hip gamma nail   surgery   ??   FINDINGS:   No acute fracture or dislocation. ??Partially imaged intramedullary nail with   a threaded locking screw. ??Mild diffuse osteopenia. ??Mild tricompartment   joint space narrowing and osteophyte formation. ??Soft tissues are   unremarkable.   ??   ??   Impression   No acute osseous abnormality or evidence of hardware complication.   ??   Mild osteoarthrosis.   ??   ??           IMPRESSION/RECOMMENDATIONS:    Post right IM nailing hip and right distal radius ORIF 11/22/17 per ortho, stable  Right knee pain  Right patellar chondromalacia.  Likely aggravated right  Knee pain with therapies and then adjusting postop to right hip surgery pain. Would suggest NSAID but currently with GI bleed?. Suggest Ice prn only for now and FU Dr Bridgett Larsson as outpt if does not improve  Discussed with DR Bridgett Larsson He will also review xrays today    [place]  01/08/2018  12:06 PM

## 2018-01-08 NOTE — Progress Notes (Signed)
Awake and oriented. To room.

## 2018-01-08 NOTE — Progress Notes (Signed)
1431--Pt admitted to PACU from endo.  MOnitors placed. O2 on at 2l/nc. Pt awake.  Abd soft and round.

## 2018-01-08 NOTE — Anesthesia Post-Procedure Evaluation (Signed)
Department of Anesthesiology  Postprocedure Note    Patient: Linda [name]  MRN: [id number]  Birthdate: 1946-02-03  Date of evaluation: 01/08/2018  Time:  4:51 PM     Procedure Summary     Date:  01/08/18 Room / Location:  [facility]    Anesthesia Start:  6387 Anesthesia Stop:  5643    Procedure:  EGD BIOPSY (N/A ) Diagnosis:  (UPPER GI BLEED)    Surgeon:  Orvan Seen, MD Responsible Provider:  Ashok Cordia, MD    Anesthesia Type:  TIVA ASA Status:  3          Anesthesia Type: TIVA    Aldrete Phase I: Aldrete Score: 10    Aldrete Phase II:      Last vitals: Reviewed and per EMR flowsheets.   Vitals:    01/08/18 1449 01/08/18 1453 01/08/18 1502 01/08/18 1620   BP: 131/66 120/74 (!) 122/51 134/61   Pulse: 52 52 (!) 48 67   Resp: 19 16 16 16    Temp:    98.4 ??F (36.9 ??C)   TempSrc:    Oral   SpO2: 94% 97% 94% 95%   Weight:       Height:           Anesthesia Post Evaluation    Patient location during evaluation: bedside  Patient participation: complete - patient participated  Level of consciousness: awake and alert  Airway patency: patent  Nausea & Vomiting: no nausea  Complications: no  Cardiovascular status: hemodynamically stable  Respiratory status: acceptable  Hydration status: euvolemic

## 2018-01-08 NOTE — Progress Notes (Signed)
Awake. O2 off.

## 2018-01-08 NOTE — Progress Notes (Signed)
1.  Patient is identified using name and date of birth.  2.  The patient is free from signs and symptoms of injury.  3.  The patient receives appropriate medication(s), safely administered during the perioperative period.  4.  The patient had wound/tissuue perfusion consistent with or improved from baseline levels established preoperatively.  5.  The patient is at or returning to normothermia at the conclusion of the immediate postoperative period.  6.  The patient's fluid, electrolyte, and acid base balances are consistent with or improved from baseline levels established preoperatively.  7.  The patient's pulmonary function is consistent with or improved from baseline levels established preoperatively.  8.  The patient's cardiovascular status is consistent with or improved from baseline levels established preoperatively.  9.  The patient/caregiver participates in decisions affecting his or her perioperative care.  10.  The patient's care is consistent with the individualized perioperative plan of care.  11.  The patient's right to privacy is maintained.  12.  The patient is the recipient of competent and ethical care within legal standards of practice.  13.  The patient's value system, lifestyle, ethnicity, and culture are considered, respected, and incorporated in the perioperative plan of care.  14.  The patient demonstrates and/or reports adequate pain control throughout the perioperative period.  15.  The patient's neurological status is consistent with or improved from baseline levels established preoperatively.  16.  The patient/caregiver demonstrates knowledge of the expected responses to the operative or invasive procedure.  17.  Patient/caregiver has reduced anxiety.  Interventions - familiarize with environment and equipment.

## 2018-01-08 NOTE — H&P (Signed)
[facility]   Pre-operative History and Physical    Patient: Linda [name]  DOB: 1946/05/10  Acct#: [id number]    History Obtained From: electronic medical record    HISTORY OF PRESENT ILLNESS  Procedure:EGD  Indications:gi bleeding  Past Medical History:        Diagnosis Date   ??? Bisphosphonate-associated osteonecrosis of the jaw [facility])    ??? Celiac artery atherosclerosis 2015    60% proximal stenosis   ??? Colon polyps     adenomatous    ??? Dyspepsia     nonulcerative   ??? Graves disease    ??? High cholesterol    ??? History of depression    ??? Hypertension    ??? 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 Migraine     recurrent, controlled with elavil   ??? Osteoarthritis of lumbar spine     Chunduri   ??? 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 Postmenopausal    ??? Thoracic compression fracture (HCC)     T10 from a fall     Past Surgical History:        Procedure Laterality Date   ??? ARTERY BIOPSY Right 12-24-12    Temporal Artery biopsy right    ??? COLONOSCOPY  [id number]    dr Paulina Fusi   3 years   ??? COLONOSCOPY N/A 07/09/2017    COLONOSCOPY POLYPECTOMY SNARE performed by Rudell Cobb, MD at [facility] ENDOSCOPY   ??? COLONOSCOPY N/A 07/09/2017    COLONOSCOPY WITH BIOPSY performed by Rudell Cobb, MD at [facility] ENDOSCOPY   ??? ESOPHAGEAL MOTILITY STUDY N/A 08/27/2017    ESOPHAGEAL MANOMETRY performed by Rudell Cobb, MD at [facility] ENDOSCOPY   ??? FIXATION KYPHOPLASTY      t10   ??? HIP FRACTURE SURGERY Right 11/22/2017    RIGHT HIP GAMMA NAILING performed by Richardson Chiquito, MD at [facility] OR   ??? HIP SURGERY Right 11/22/2017    right hip gamma nailing   ??? ORIF DISTAL RADIUS FRACTURE Right 11/22/2017    OPEN REDUCTION INTERNAL FIXATION RIGHT RADIUS WITH C-ARM performed by Filbert Berthold, MD at [facility] OR   ??? SHOULDER SURGERY      left humerus fx 3 places   ??? UPPER GASTROINTESTINAL ENDOSCOPY  08/26/2014    dr Onalee Hua Dyanne [place]:negative   ??? UPPER GASTROINTESTINAL  ENDOSCOPY N/A 08/08/2017    EGD ESOPHAGOGASTRODUODENOSCOPY performed by Rudell Cobb, MD at [facility] ENDOSCOPY   ??? WRIST FRACTURE SURGERY Right 11/22/2017     Medications prior to admission:   Prior to Admission medications    Medication Sig Start Date End Date Taking? Authorizing Provider   nadolol (CORGARD) 20 MG tablet Take 10 mg by mouth nightly   Yes Historical Provider, MD   celecoxib (CELEBREX) 200 MG capsule TAKE 1 CAPSULE BY MOUTH DAILY 01/01/18  Yes Ritta Slot, MD   PARoxetine (PAXIL) 40 MG tablet Take 1 tablet by mouth daily 12/24/17  Yes Ritta Slot, MD   traZODone (DESYREL) 50 MG tablet TAKE 1 TABLET BY MOUTH EVERY NIGHT AS NEEDED FOR SLEEP 12/24/17  Yes Ritta Slot, MD   docusate sodium (COLACE, DULCOLAX) 100 MG CAPS Take 100 mg by mouth 2 times daily 11/24/17  Yes Galen Daft, MD   pantoprazole (PROTONIX) 40 MG tablet Take 1 tablet by mouth every morning (before breakfast) 11/25/17  Yes Galen Daft, MD   divalproex (DEPAKOTE ER) 500 MG extended release tablet Take 1 tablet by mouth nightly 11/24/17  Yes Galen Daft, MD   Polysaccharide Iron Complex 391.3 (180 Fe) MG CAPS One a day for six months 08/02/17  Yes Ritta Slot, MD   Cobalamine Combinations (B12 FOLATE) 800-800 MCG CAPS One a day indefinitely 06/24/17  Yes Ritta Slot, MD   Calcium Carbonate-Vitamin D (CALTRATE 600+D) 600-400 MG-UNIT CHEW Take 1 tablet by mouth 2 times daily. 01/16/10  Yes Ritta Slot, MD     Allergies:   Tamsen Snider [risedronate sodium]    Social History     Socioeconomic History   ??? Marital status: Married     Spouse name: Not on file   ??? Number of children: 1   ??? Years of education: Not on file   ??? Highest education level: Not on file   Occupational History   ??? Occupation: secretary/bar tender   Social Needs   ??? Financial resource strain: Not on file   ??? Food insecurity:     Worry: Not on file     Inability: Not on file   ??? Transportation needs:     Medical: Not on file     Non-medical: Not on file   Tobacco Use   ??? Smoking  status: Former Smoker     Packs/day: 0.00     Years: 40.00     Pack years: 0.00     Last attempt to quit: 08/06/1997     Years since quitting: 20.4   ??? Smokeless tobacco: Never Used   ??? Tobacco comment: passive exposure husband chain smokes   Substance and Sexual Activity   ??? Alcohol use: Yes     Alcohol/week: 0.0 oz     Comment: rarely, socially   ??? Drug use: No   ??? Sexual activity: Not Currently   Lifestyle   ??? Physical activity:     Days per week: Not on file     Minutes per session: Not on file   ??? Stress: Not on file   Relationships   ??? Social connections:     Talks on phone: Not on file     Gets together: Not on file     Attends religious service: Not on file     Active member of club or organization: Not on file     Attends meetings of clubs or organizations: Not on file     Relationship status: Not on file   ??? Intimate partner violence:     Fear of current or ex partner: Not on file     Emotionally abused: Not on file     Physically abused: Not on file     Forced sexual activity: Not on file   Other Topics Concern   ??? Not on file   Social History Narrative   ??? Not on file     Family History   Problem Relation Age of Onset   ??? Migraines Other    ??? Diabetes Other    ??? Breast Cancer Sister    ??? Cancer Mother    ??? Cancer Father          PHYSICAL EXAM:      BP (!) 128/54    Pulse (!) 47    Temp 98.4 ??F (36.9 ??C) (Oral)    Resp 16    Ht 5\' 4"  (1.626 m)    Wt 138 lb 14.2 oz (63 kg)  SpO2 93%    BMI 23.84 kg/m??  I        Heart:normal    Lungs: normal    Abdomen: normal      ASA Grade:  See anesthesia note      ASSESSMENT AND PLAN:    1.  Procedure options, risks and benefits reviewed with patient and expresses understanding.

## 2018-01-08 NOTE — Anesthesia Pre-Procedure Evaluation (Signed)
Department of Anesthesiology  Preprocedure Note       Name:  Linda [name]   Age:  72 y.o.  DOB:  06-24-1946                                          MRN:  [id number]         Date:  01/08/2018      Surgeon: Juliann Mule):  Orvan Seen, MD    Procedure: ESOPHAGOGASTRODUODENOSCOPY (N/A )    Medications prior to admission:   Prior to Admission medications    Medication Sig Start Date End Date Taking? Authorizing Provider   nadolol (CORGARD) 20 MG tablet Take 10 mg by mouth nightly   Yes Historical Provider, MD   celecoxib (CELEBREX) 200 MG capsule TAKE 1 CAPSULE BY MOUTH DAILY 01/01/18  Yes Angelica Pou, MD   PARoxetine (PAXIL) 40 MG tablet Take 1 tablet by mouth daily 12/24/17  Yes Angelica Pou, MD   traZODone (DESYREL) 50 MG tablet TAKE 1 TABLET BY MOUTH EVERY NIGHT AS NEEDED FOR SLEEP 12/24/17  Yes Angelica Pou, MD   docusate sodium (COLACE, DULCOLAX) 100 MG CAPS Take 100 mg by mouth 2 times daily 11/24/17  Yes Zacarias Pontes, MD   pantoprazole (PROTONIX) 40 MG tablet Take 1 tablet by mouth every morning (before breakfast) 11/25/17  Yes Zacarias Pontes, MD   divalproex (DEPAKOTE ER) 500 MG extended release tablet Take 1 tablet by mouth nightly 11/24/17  Yes Zacarias Pontes, MD   Polysaccharide Iron Complex 391.3 (180 Fe) MG CAPS One a day for six months 08/02/17  Yes Angelica Pou, MD   Cobalamine Combinations (B12 FOLATE) 800-800 MCG CAPS One a day indefinitely 06/24/17  Yes Angelica Pou, MD   Calcium Carbonate-Vitamin D (CALTRATE 600+D) 600-400 MG-UNIT CHEW Take 1 tablet by mouth 2 times daily. 01/16/10  Yes Angelica Pou, MD       Current medications:    Current Facility-Administered Medications   Medication Dose Route Frequency Provider Last Rate Last Dose   ??? 0.9 % sodium chloride infusion   Intravenous Continuous Dolly Rias, MD       ??? sodium chloride flush 0.9 % injection 10 mL  10 mL Intravenous 2 times per day Dolly Rias, MD       ??? sodium chloride flush 0.9 % injection 10 mL  10 mL Intravenous PRN Dolly Rias, MD       ???  pantoprazole (PROTONIX) 80 mg in sodium chloride 0.9 % 100 mL infusion  8 mg/hr Intravenous Continuous Maren Beach, MD 10 mL/hr at 01/08/18 0651 8 mg/hr at 01/08/18 0651   ??? divalproex (DEPAKOTE ER) extended release tablet 500 mg  500 mg Oral Nightly Angelica Pou, MD   500 mg at 01/07/18 2052   ??? nadolol (CORGARD) tablet 10 mg  10 mg Oral Nightly Angelica Pou, MD   10 mg at 01/07/18 2052   ??? PARoxetine (PAXIL) tablet 40 mg  40 mg Oral Daily Angelica Pou, MD   40 mg at 01/08/18 0845   ??? traZODone (DESYREL) tablet 50 mg  50 mg Oral Nightly PRN Angelica Pou, MD   50 mg at 01/07/18 2259   ??? sodium chloride flush 0.9 % injection 10 mL  10 mL Intravenous 2 times per day Angelica Pou, MD   10 mL at 01/08/18 0909   ??? sodium chloride  flush 0.9 % injection 10 mL  10 mL Intravenous PRN Angelica Pou, MD       ??? acetaminophen (TYLENOL) tablet 650 mg  650 mg Oral Q4H PRN Angelica Pou, MD       ??? ondansetron [facility]) injection 4 mg  4 mg Intravenous Q6H PRN Angelica Pou, MD   4 mg at 01/08/18 0109   ??? 0.9 % sodium chloride infusion   Intravenous Continuous Angelica Pou, MD 150 mL/hr at 01/08/18 0651     ??? iron sucrose (VENOFER) injection 200 mg  200 mg Intravenous Daily Angelica Pou, MD   200 mg at 01/08/18 3235       Allergies:    Allergies   Allergen Reactions   ??? Actonel [Risedronate Sodium]      Osteonecrosis jaw documented       Problem List:    Patient Active Problem List   Diagnosis Code   ??? Hyperlipidemia E78.5   ??? 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 Menopausal and postmenopausal disorder N95.9   ??? GERD (gastroesophageal reflux disease) K21.9   ??? Migraine headache G43.909   ??? Colon polyps K63.5   ??? Depression F32.9   ??? Intractable headache R51   ??? Hypoxemia R09.02   ??? Osteoporosis M81.0   ??? Intractable chronic migraine without aura G43.719   ??? Medication overuse headache G44.40   ??? 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 Graves disease E05.00   ??? Closed intertrochanteric  fracture of hip, right, initial encounter ([place]) S72.141A   ??? Closed fracture of right wrist S62.101A   ??? Closed fracture of right distal radius S52.501A   ??? Closed fracture of intertrochanteric section of femur, right, sequela S72.141S   ??? UGI bleed K92.2   ??? Acute pain of right knee M25.561       Past Medical History:        Diagnosis Date   ??? Bisphosphonate-associated osteonecrosis of the jaw [facility])    ??? Celiac artery atherosclerosis 2015    60% proximal stenosis   ??? Colon polyps     adenomatous    ??? Dyspepsia     nonulcerative   ??? Graves disease    ??? High cholesterol    ??? History of depression    ??? Hypertension    ??? 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 Migraine     recurrent, controlled with elavil   ??? Osteoarthritis of lumbar spine     Chunduri   ??? 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 Postmenopausal    ??? Thoracic compression fracture ([place])     T10 from a fall       Past Surgical History:        Procedure Laterality Date   ??? ARTERY BIOPSY Right 12-24-12    Temporal Artery biopsy right    ??? COLONOSCOPY  [id number]    dr Awanda Mink   3 years   ??? COLONOSCOPY N/A 07/09/2017    COLONOSCOPY POLYPECTOMY SNARE performed by Orvan Seen, MD at [place]   ??? COLONOSCOPY N/A 07/09/2017    COLONOSCOPY WITH BIOPSY performed by Orvan Seen, MD at [place]   ??? ESOPHAGEAL MOTILITY STUDY N/A 08/27/2017    ESOPHAGEAL MANOMETRY performed by Orvan Seen, MD at [place]   ???  FIXATION KYPHOPLASTY      t10   ??? HIP FRACTURE SURGERY Right 11/22/2017    RIGHT HIP GAMMA NAILING performed by Arville Lime, MD at [facility]   ??? HIP SURGERY Right 11/22/2017    right hip gamma nailing   ??? ORIF DISTAL RADIUS FRACTURE Right 11/22/2017    OPEN REDUCTION INTERNAL FIXATION RIGHT RADIUS WITH C-ARM performed by Vevelyn Francois, MD at [place]   ??? SHOULDER SURGERY      left humerus fx 3 places   ??? UPPER GASTROINTESTINAL ENDOSCOPY  08/26/2014     dr Shanon Brow [place]:negative   ??? UPPER GASTROINTESTINAL ENDOSCOPY N/A 08/08/2017    EGD ESOPHAGOGASTRODUODENOSCOPY performed by Orvan Seen, MD at [place]   ??? WRIST FRACTURE SURGERY Right 11/22/2017       Social History:    Social History     Tobacco Use   ??? Smoking status: Former Smoker     Packs/day: 0.00     Years: 40.00     Pack years: 0.00     Last attempt to quit: 08/06/1997     Years since quitting: 20.4   ??? Smokeless tobacco: Never Used   ??? Tobacco comment: passive exposure husband chain smokes   Substance Use Topics   ??? Alcohol use: Yes     Alcohol/week: 0.0 oz     Comment: rarely, socially                                Counseling given: No  Comment: passive exposure husband chain smokes      Vital Signs (Current):   Vitals:    01/07/18 2335 01/08/18 0511 01/08/18 0745 01/08/18 1200   BP: (!) 107/59 (!) 92/46 97/67 (!) 128/54   Pulse: 56 52 53 (!) 47   Resp: 16 18 16 16    Temp: 99.1 ??F (37.3 ??C) 98.5 ??F (36.9 ??C) 98.4 ??F (36.9 ??C) 98.4 ??F (36.9 ??C)   TempSrc: Oral Oral Oral Oral   SpO2: 93% 97% 95% 93%   Weight:  138 lb 14.2 oz (63 kg)     Height:                                                  BP Readings from Last 3 Encounters:   01/08/18 (!) 128/54   12/24/17 106/66   12/11/17 132/70       NPO Status: Time of last liquid consumption: 2350                        Time of last solid consumption: 1400                        Date of last liquid consumption: 01/07/18                        Date of last solid food consumption: 01/06/18    BMI:   Wt Readings from Last 3 Encounters:   01/08/18 138 lb 14.2 oz (63 kg)   12/24/17 136 lb 9.6 oz (62 kg)   12/20/17 140 lb (63.5 kg)     Body mass index is 23.84 kg/m??.    CBC:   Lab  Results   Component Value Date    WBC 6.2 01/08/2018    RBC 2.55 01/08/2018    HGB 8.0 01/08/2018    HCT 25.4 01/08/2018    MCV 99.9 01/08/2018    RDW 15.2 01/08/2018    PLT 142 01/08/2018       CMP:   Lab Results   Component Value Date    NA 138 01/07/2018    K 4.4 01/07/2018    K 3.7  11/21/2017    CL 102 01/07/2018    CO2 28 01/07/2018    BUN 38 01/07/2018    CREATININE 0.6 01/07/2018    GFRAA >60 01/07/2018    GFRAA >60 05/12/2010    AGRATIO 1.3 01/07/2018    LABGLOM >60 01/07/2018    GLUCOSE 131 01/07/2018    PROT 7.0 01/07/2018    PROT 7.8 05/12/2010    CALCIUM 9.0 01/07/2018    BILITOT 0.5 01/07/2018    ALKPHOS 206 01/07/2018    AST 14 01/07/2018    ALT 8 01/07/2018       POC Tests: No results for input(s): POCGLU, POCNA, POCK, POCCL, POCBUN, POCHEMO, POCHCT in the last 72 hours.    Coags:   Lab Results   Component Value Date    PROTIME 15.3 01/07/2018    INR 1.34 01/07/2018    APTT 28.3 11/21/2017       HCG (If Applicable): No results found for: PREGTESTUR, PREGSERUM, HCG, HCGQUANT     ABGs:   Lab Results   Component Value Date    PHART 7.409 12/23/2012    PO2ART 112.0 12/23/2012    PCO2ART 34.8 12/23/2012    HCO3ART 21.6 12/23/2012    BEART -2.0 12/23/2012    O2SATART 98.4 12/23/2012        Type & Screen (If Applicable):  No results found for: LABABO, [place]    Anesthesia Evaluation  Patient summary reviewed and Nursing notes reviewed no history of anesthetic complications:   Airway: Mallampati: II  TM distance: >3 FB   Neck ROM: full  Mouth opening: > = 3 FB Dental:    (+) upper dentures and partials      Pulmonary:Negative Pulmonary ROS breath sounds clear to auscultation      (-) COPD, asthma, sleep apnea and not a current smoker          Patient did not smoke on day of surgery.                 Cardiovascular:    (+) hypertension:, hyperlipidemia    (-) past MI, CAD, CABG/stent, dysrhythmias and  angina    ECG reviewed  Rhythm: regular  Rate: normal           Beta Blocker:  Dose within 24 Hrs         Neuro/Psych:   (+) headaches: migraine headaches, depression/anxiety    (-) seizures, TIA and CVA           GI/Hepatic/Renal:   (+) GERD:,      (-) liver disease and no renal disease       Endo/Other:    (+) hyperthyroidism (graves dz.), blood dyscrasia: anticoagulation therapy and anemia,  arthritis: OA., no malignancy/cancer.    (-) diabetes mellitus, no malignancy/cancer               Abdominal:           Vascular:  Anesthesia Plan      TIVA     ASA 3       Induction: intravenous.    MIPS: Prophylactic antiemetics administered.  Anesthetic plan and risks discussed with patient.      Plan discussed with CRNA.              This pre-anesthesia assessment may be used as a history and physical.    DOS STAFF ADDENDUM:    Pt seen and examined, chart reviewed (including anesthesia, drug and allergy history).  No interval changes to history and physical examination.  Anesthetic plan, risks, benefits, alternatives, and personnel involved discussed with patient.  Patient verbalized an understanding and agrees to proceed.      Ashok Cordia, MD  January 08, 2018  1:39 PM      Ashok Cordia, MD   01/08/2018

## 2018-01-08 NOTE — Progress Notes (Signed)
Patient's 1800 hgb 7.9. Dr. Paulina Fusi notified, believes may be dilution since no active blee found today on EGD. H/H changed to be drawn every 12 hours instead of Q6H per Dr. Paulina Fusi. Will continue to monitor.    [employer]

## 2018-01-08 NOTE — Plan of Care (Signed)
Problem: Falls - Risk of:  Goal: Absence of physical injury  Outcome: Met This Shift     Problem: SAFETY  Goal: Free from accidental physical injury  Outcome: Met This Shift  Note:   All precautions in place per policy and indication. No accidental physical injury noted this shift. Continue to monitor.      Problem: SKIN INTEGRITY  Goal: Skin integrity is maintained or improved  Outcome: Met This Shift     Problem: Falls - Risk of:  Goal: Will remain free from falls  Outcome: Ongoing  Note:   Fall risk assessment completed every shift. All precautions in place. Pt has call light within reach at all times. Room clear of clutter. Pt aware to call for assistance when getting up. Bed alarm on.No falls this shift.      Problem: DAILY CARE  Goal: Daily care needs are met  Outcome: Ongoing  Note:   Able to make needs known. Able to ambulate, feed and bathe self. Call light available and use encouraged. Rounding throughout night per protocol to assist with any needs.      Problem: PAIN  Goal: Patient's pain/discomfort is manageable  Outcome: Ongoing  Note:   Pain level and characteristics of pain assessed.  Patient included in decisions related to pain management.  Pain medications given as ordered after other non-medication management options have been attempted.  Effectiveness of pain medications assessed and ineffective pain management reported to MD as needed.      Problem: KNOWLEDGE DEFICIT  Goal: Patient/S.O. demonstrates understanding of disease process, treatment plan, medications, and discharge instructions.  Outcome: Ongoing     Problem: DISCHARGE BARRIERS  Goal: Patient's continuum of care needs are met  Outcome: Ongoing

## 2018-01-08 NOTE — Care Coordination-Inpatient (Signed)
Reviewed chart, met with patient to assess possible discharge needs. Explained Case Management role/services.    DEMOGRAPHICS: Verified as correct    CURRENT TYPE OF DWELLING: Two story home.  Pt stated that she has not been using the second floor of he home since her hip surgery 7 weeks ago.    LIVING ARRANGEMENTS: Pt resides with her spouse.    LEVEL OF FUNCTION/SUPPORT: Pt stated that prior to her hip surgery 7 weeks ago she was completely independent with all ADL's and IADL's.  Pt stated that she was just getting back to being independent with her self care and cooking when this happened.     PCP: Dr. Angelica Pou     LAST VISIT TO PCP: Last Month    DME: Gilford Rile, safety frame around toilet and shower chair    OXYGEN/HD: No    ACTIVE COMMUNITY RESOURCES/AGENCIES/SKILLED HOME CARE: Pt stated that even after her hip surgery she declined home care.  Pt stated that she is not interested in any assistance in her home.     LIMITATIONS TO MEDICATION ADHERENCE: Pt stated that she has all of her medication filled at [facility] in [place].  Pt stated that she has no problems taking her medications as indicated or affording her medications.     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  Spouse is currently providing all transportation.    TRANSPORTATION UPON D/C: Spouse will transport pt home upon d/c.    TENTATIVE D/C PLAN:  Pt plans to return home upon d/c and denied any further needs at this time.     SW provided contact information for patient or family to call with any questions.  SW will follow and assist as needed.    Mitzi Hansen, LSW  Social Worker  [phone]  Electronically signed by Marca Ancona on 01/08/2018 at 11:52 AM

## 2018-01-08 NOTE — Telephone Encounter (Signed)
[employer] right knee  Nurse Lind Covert  [phone]  Ref Dr Garen Grams    CC/MEG

## 2018-01-09 ENCOUNTER — Inpatient Hospital Stay: Admit: 2018-01-09 | Payer: MEDICARE | Primary: Internal Medicine

## 2018-01-09 LAB — HEMOGLOBIN
Hemoglobin: 8 g/dL — ABNORMAL LOW (ref 12.0–16.0)
Hemoglobin: 8.8 g/dL — ABNORMAL LOW (ref 12.0–16.0)

## 2018-01-09 MED ORDER — DIATRIZOATE MEGLUMINE & SODIUM 66-10 % PO SOLN
66-10 % | Freq: Once | ORAL | Status: DC | PRN
Start: 2018-01-09 — End: 2018-01-10
  Administered 2018-01-09: 13:00:00 120 mL via ORAL

## 2018-01-09 MED ORDER — BUTALBITAL-APAP-CAFFEINE 50-325-40 MG PO TABS
50-325-40 MG | Freq: Every day | ORAL | Status: DC | PRN
Start: 2018-01-09 — End: 2018-01-10

## 2018-01-09 MED FILL — TRAZODONE HCL 50 MG PO TABS: 50 mg | ORAL | Qty: 1

## 2018-01-09 MED FILL — VENOFER 20 MG/ML IV SOLN: 20 mg/mL | INTRAVENOUS | Qty: 10

## 2018-01-09 MED FILL — PROTONIX 40 MG IV SOLR: 40 mg | INTRAVENOUS | Qty: 80

## 2018-01-09 MED FILL — NADOLOL 20 MG PO TABS: 20 mg | ORAL | Qty: 0.5

## 2018-01-09 MED FILL — PAROXETINE HCL 20 MG PO TABS: 20 mg | ORAL | Qty: 2

## 2018-01-09 MED FILL — DIVALPROEX SODIUM ER 500 MG PO TB24: 500 mg | ORAL | Qty: 1

## 2018-01-09 NOTE — Progress Notes (Signed)
Gastroenterology Note  Patient:   Linda [name]   DOB:    19-Jun-1946   Facility:   [facility]   Date:     01/09/2018  Consultant:   Rudell Cobb, MD      Subjective:     72 y.o. female admitted 01/07/2018 with UGI bleed [K92.2] and seen for GI bleeding..    Present  Diet Order: DIET GENERAL;      Current Medications include:   Scheduled Meds:  . divalproex  500 mg Oral Nightly   . nadolol  10 mg Oral Nightly   . PARoxetine  40 mg Oral Daily   . sodium chloride flush  10 mL Intravenous 2 times per day   . iron sucrose  200 mg Intravenous Daily     Continuous Infusions:  . sodium chloride     . pantoprozole (PROTONIX) infusion 8 mg/hr (01/09/18 0522)   . sodium chloride 150 mL/hr at 01/09/18 1010     PRN Meds:.diatrizoate meglumine-sodium, butalbital-acetaminophen-caffeine, traZODone, sodium chloride flush, acetaminophen, ondansetron    Allergies:   Allergies   Allergen Reactions   . Actonel [Risedronate Sodium]      Osteonecrosis jaw documented       Objective:   Vital Signs:  Temp (24hrs), Avg:98.4 F (36.9 C), Min:97.9 F (36.6 C), Max:98.6 F (37 C)     Systolic (24hrs), Avg:126 , Min:100 , Max:148      Diastolic (24hrs), Avg:63, Min:51, Max:77     Pulse  Avg: 56.7  Min: 48  Max: 76  BP (!) 148/71   Pulse 68   Temp 97.9 F (36.6 C) (Oral)   Resp 16   Ht 5\' 4"  (1.626 m)   Wt 138 lb 7.2 oz (62.8 kg)   SpO2 96%   BMI 23.76 kg/m      Physical Exam:   General appearance: alert and appears stated age  Lungs: clear to auscultation bilaterally  Heart: regular rate and rhythm, S1, S2 normal, no murmur, click, rub or gallop  Abdomen: soft, non-tender; bowel sounds normal; no masses,  no organomegaly    Lab and Imaging Review   Recent Labs     01/07/18  0938 01/07/18  0939  01/08/18  0148 01/08/18  0515 01/08/18  1217 01/08/18  1812 01/09/18  0638 01/09/18  0953   WBC  --  7.7  --   --  6.2  --   --   --   --    HGB  --  11.3*   < > 8.6* 8.3* 8.0* 7.9* 8.0* 8.8*   MCV  --   99.1  --   --  99.9  --   --   --   --    PLT  --  193  --   --  142  --   --   --   --    INR  --  1.34*  --   --   --   --   --   --   --    NA 138  --   --   --   --   --   --   --   --    K 4.4  --   --   --   --   --   --   --   --    CL 102  --   --   --   --   --   --   --   --  CO2 28  --   --   --   --   --   --   --   --    BUN 38*  --   --   --   --   --   --   --   --    CREATININE 0.6  --   --   --   --   --   --   --   --    GLUCOSE 131*  --   --   --   --   --   --   --   --    CALCIUM 9.0  --   --   --   --   --   --   --   --    PROT 7.0  --   --   --   --   --   --   --   --    LABALBU 3.9  --   --   --   --   --   --   --   --    AST 14*  --   --   --   --   --   --   --   --    ALT 8*  --   --   --   --   --   --   --   --    ALKPHOS 206*  --   --   --   --   --   --   --   --    BILITOT 0.5  --   --   --   --   --   --   --   --    LIPASE 13.0  --   --   --   --   --   --   --   --     < > = values in this interval not displayed.       Assessment:     Patient Active Problem List    Diagnosis Date Noted   . Hyperlipidemia      Priority: High   . Acute pain of right knee 01/08/2018   . UGI bleed 01/07/2018   . Closed fracture of intertrochanteric section of femur, right, sequela 11/24/2017   . Closed fracture of right distal radius    . Closed intertrochanteric fracture of hip, right, initial encounter (HCC) 11/21/2017   . Closed fracture of right wrist 11/21/2017   . Graves disease 09/17/2017   . 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 Other intestinal malabsorption 08/09/2017   . Intractable chronic migraine without aura 02/27/2013   . Medication overuse headache 02/27/2013   . Intractable headache 12/23/2012   . Hypoxemia 12/23/2012   . Osteoporosis 12/23/2012   . Depression 07/25/2010   . Senile osteoporosis    . Menopausal and postmenopausal disorder    . GERD (gastroesophageal reflux disease)    . Migraine headache    . Colon polyps        Plan:   1. UGI  shows a large paraesophageal hernia. She is on pantoprazole. She is to get IV iron. Will ask Dr. Danella Deis to see for a surgical opinion on the paraesophageal hernia.

## 2018-01-09 NOTE — Telephone Encounter (Signed)
Dr Imogene Burn called the nurse.

## 2018-01-09 NOTE — Telephone Encounter (Signed)
Alle calling from the hospital about patient  Dr Bridgett Larsson saw her yesterday  Please call   Nurse Alle  [phone]  Room 51  Ref Dr Garen Grams

## 2018-01-09 NOTE — Plan of Care (Signed)
Problem: Falls - Risk of:  Goal: Will remain free from falls  01/09/2018 0121 by Andris Flurry, RN  Outcome: Met This Shift  Note:   Fall risk assessment completed every shift. All precautions in place. Pt has call light within reach at all times. Room clear of clutter. Pt aware to call for assistance when getting up. Bed alarm on.No falls this shift.   01/08/2018 1609 by Arley Phenix, RN  Outcome: Ongoing  Goal: Absence of physical injury  Outcome: Met This Shift     Problem: SAFETY  Goal: Free from accidental physical injury  01/09/2018 0121 by Andris Flurry, RN  Outcome: Met This Shift  Note:   All precautions in place per policy and indication. No accidental physical injury noted this shift. Continue to monitor.   01/08/2018 1609 by Arley Phenix, RN  Outcome: Ongoing  Goal: Free from intentional harm  Outcome: Met This Shift     Problem: DAILY CARE  Goal: Daily care needs are met  Outcome: Met This Shift  Note:   Able to make needs known. Able to ambulate, feed and bathe self. Call light available and use encouraged. Rounding throughout night per protocol to assist with any needs.      Problem: PAIN  Goal: Patient's pain/discomfort is manageable  01/09/2018 0121 by Andris Flurry, RN  Outcome: Met This Shift  Note:   No complaints of pain.  01/08/2018 1609 by Arley Phenix, RN  Outcome: Ongoing     Problem: SKIN INTEGRITY  Goal: Skin integrity is maintained or improved  Outcome: Met This Shift     Problem: Pain:  Goal: Pain level will decrease  Outcome: Met This Shift  Goal: Control of acute pain  Outcome: Met This Shift  Goal: Control of chronic pain  Outcome: Met This Shift     Problem: KNOWLEDGE DEFICIT  Goal: Patient/S.O. demonstrates understanding of disease process, treatment plan, medications, and discharge instructions.  Outcome: Ongoing     Problem: DISCHARGE BARRIERS  Goal: Patient's continuum of care needs are met  Outcome: Ongoing

## 2018-01-09 NOTE — Consults (Signed)
[facility]  [phone]        Surgery Consult     Pt Name: Linda [name]  MRN: [id number]  Birthdate: [birth year]  Date of evaluation: 01/09/2018  Primary Care Physician: Angelica Pou, MD  Patient evaluated at the request of  Dr. Awanda Mink     Chief Complaint   Patient presents with   ??? Diarrhea     pt states "black diarrhea" present since 2:30am   ??? Emesis     emesis x15 since 2:30am; pt states emesis is "as black as my diarrhea"         Assessment/Plan   Hiatal hernia, bleeding Linda [name] ulcers, anemia  -Will discuss surgical repair. Timing to be determined.   -Will need medical clearance prior to surgery.    SUBJECTIVE:   History of Chief Complaint:    Linda [name] is a 72 y.o. female PMH hiatal hernia, recent hip and wrist fx and recurrent migraines admitted 01/07/2018 for abdominal pain, hematochezia and melena. Tachycardic on admission. Initially not anemic but Hg dropped overnight hospital day one from 11.3-->9.5, with a low of 7.9. EGD 6/5 large hiatal hernia with Linda [name] ulcers present with fresh bleeding. Dr. Janan Halter has evaluated her in the past for this known hiatal hernia with ulcers in January 2019. Did not get the surgery.  Recent course complicated by Grave's disease, but complicated presentation and has been referred to endocrinology.   S/p right knee injection for pain by ortho 6/6.     Past Medical History   has a past medical history of Bisphosphonate-associated osteonecrosis of the jaw [facility]), Celiac artery atherosclerosis, Colon polyps, Dyspepsia, Graves disease, High cholesterol, History of depression, Hypertension, Iron deficiency anemia secondary to blood loss (chronic), Mass of parotid gland, Migraine, Osteoarthritis of lumbar spine, Other intestinal malabsorption, Postmenopausal, and Thoracic compression fracture ([place]).    Past Surgical History   has a past surgical history that includes shoulder surgery; Artery Biopsy (Right, 12-24-12); Upper gastrointestinal  endoscopy (08/26/2014); Colonoscopy ([id number]); Colonoscopy (N/A, 07/09/2017); Colonoscopy (N/A, 07/09/2017); Upper gastrointestinal endoscopy (N/A, 08/08/2017); esophageal motility study (N/A, 08/27/2017); Fixation Kyphoplasty; Wrist fracture surgery (Right, 11/22/2017); hip surgery (Right, 11/22/2017); Hip fracture surgery (Right, 11/22/2017); ORIF DISTAL RADIUS FRACTURE (Right, 11/22/2017); and Upper gastrointestinal endoscopy (N/A, 01/08/2018).    Medications  Prior to Admission medications    Medication Sig Start Date End Date Taking? Authorizing Provider   nadolol (CORGARD) 20 MG tablet Take 10 mg by mouth nightly   Yes Historical Provider, MD   celecoxib (CELEBREX) 200 MG capsule TAKE 1 CAPSULE BY MOUTH DAILY 01/01/18  Yes Angelica Pou, MD   PARoxetine (PAXIL) 40 MG tablet Take 1 tablet by mouth daily 12/24/17  Yes Angelica Pou, MD   traZODone (DESYREL) 50 MG tablet TAKE 1 TABLET BY MOUTH EVERY NIGHT AS NEEDED FOR SLEEP 12/24/17  Yes Angelica Pou, MD   docusate sodium (COLACE, DULCOLAX) 100 MG CAPS Take 100 mg by mouth 2 times daily 11/24/17  Yes Zacarias Pontes, MD   pantoprazole (PROTONIX) 40 MG tablet Take 1 tablet by mouth every morning (before breakfast) 11/25/17  Yes Zacarias Pontes, MD   divalproex (DEPAKOTE ER) 500 MG extended release tablet Take 1 tablet by mouth nightly 11/24/17  Yes Zacarias Pontes, MD   Polysaccharide Iron Complex 391.3 (180 Fe) MG CAPS One a day for six months 08/02/17  Yes Angelica Pou, MD   Cobalamine Combinations (B12 FOLATE) 800-800 MCG CAPS One a day indefinitely 06/24/17  Yes  Angelica Pou, MD   Calcium Carbonate-Vitamin D (CALTRATE 600+D) 600-400 MG-UNIT CHEW Take 1 tablet by mouth 2 times daily. 01/16/10  Yes Angelica Pou, MD    Scheduled Meds:  ??? divalproex  500 mg Oral Nightly   ??? nadolol  10 mg Oral Nightly   ??? PARoxetine  40 mg Oral Daily   ??? sodium chloride flush  10 mL Intravenous 2 times per day   ??? iron sucrose  200 mg Intravenous Daily     Continuous Infusions:  ??? sodium chloride     ???  pantoprozole (PROTONIX) infusion 8 mg/hr (01/09/18 0522)   ??? sodium chloride 150 mL/hr at 01/09/18 1010     PRN Meds:.diatrizoate meglumine-sodium, butalbital-acetaminophen-caffeine, traZODone, sodium chloride flush, acetaminophen, ondansetron    Allergies  is allergic to actonel [risedronate sodium].    Family History  Reviewed, non contribtory  family history includes Breast Cancer in her sister; Cancer in her father and mother; Diabetes in an other family member; Migraines in an other family member.    Social History  Reviewed, non contributory   reports that she quit smoking about 20 years ago. She smoked 0.00 packs per day for 40.00 years. She has never used smokeless tobacco. She reports that she drinks alcohol. She reports that she does not use drugs.    Review of Systems:  General Denies any fever or chills  HEENT Denies any diplopia, tinnitus or vertigo  Resp Denies any shortness of breath, cough or wheezing  Cardiac Denies any chest pain, palpitations, claudication or edema  GI Denies any melena, hematochezia, hematemesis or pyrosis  GU Denies any frequency, urgency, hesitancy or incontinence  Heme Denies bruising or bleeding easily  Endocrine Denies any history of diabetes or thyroid disease  Neuro Denies any focal motor or sensory deficits    OBJECTIVE:   VITALS:  height is 5\' 4"  (1.626 m) and weight is 138 lb 7.2 oz (62.8 kg). Her oral temperature is 97.9 ??F (36.6 ??C). Her blood pressure is 148/71 (abnormal) and her pulse is 68. Her respiration is 16 and oxygen saturation is 96%.   CONSTITUTIONAL: Alert and oriented times 3, no acute distress and cooperative to examination with proper mood and affect.  SKIN: Skin color, texture, turgor normal. No rashes or lesions.  LYMPH: no cervical nodes, no inguinal nodes  HEENT: Head is normocephalic, atraumatic. EOMI, PERRLA.  NECK: Supple, symmetrical, trachea midline, no adenopathy, thyroid symmetric, not enlarged and no tenderness, skin normal.  CHEST/LUNGS:  chest symmetric with normal A/P diameter, normal respiratory rate and rhythm, lungs clear to auscultation without wheezes, rales or rhonchi. No accessory muscle use.   CARDIOVASCULAR: Heart sounds are normal.  Regular rate and rhythm without murmur, gallop or rub.  Carotid and femoral pulses 2+/4 and equal bilaterally.  ABDOMEN: Soft, nontender, nondistended.  RECTAL: deferred, not clinically indicated  NEUROLOGIC: There are no focalizing motor or sensory deficits. CN II-XII are grossly intact.Marland Kitchen   EXTREMITIES: no cyanosis, no clubbing and no edema.    LABS:     Recent Labs     01/07/18  0938 01/07/18  0939  01/08/18  0515  01/08/18  1812 01/09/18  0638 01/09/18  0953   WBC  --  7.7  --  6.2  --   --   --   --    HGB  --  11.3*   < > 8.3*   < > 7.9* 8.0* 8.8*   HCT  --  33.1*  --  25.4*  --   --   --   --    PLT  --  193  --  142  --   --   --   --    NA 138  --   --   --   --   --   --   --    K 4.4  --   --   --   --   --   --   --    CL 102  --   --   --   --   --   --   --    CO2 28  --   --   --   --   --   --   --    BUN 38*  --   --   --   --   --   --   --    CREATININE 0.6  --   --   --   --   --   --   --    CALCIUM 9.0  --   --   --   --   --   --   --    INR  --  1.34*  --   --   --   --   --   --    AST 14*  --   --   --   --   --   --   --    ALT 8*  --   --   --   --   --   --   --    BILITOT 0.5  --   --   --   --   --   --   --     < > = values in this interval not displayed.     Recent Labs     01/07/18  0938   ALKPHOS 206*   ALT 8*   AST 14*   BILITOT 0.5   LABALBU 3.9   LIPASE 13.0         RADIOLOGY:   I have personally reviewed the following films:  FL UGI   Final Result   Large paraesophageal hiatal hernia.      Moderate esophageal dysmotility.      Gastric ulceration.  Please refer to recent endoscopy.         XR KNEE RIGHT (1-2 VIEWS)   Final Result   No acute osseous abnormality or evidence of hardware complication.      Mild osteoarthrosis.         CT ABDOMEN PELVIS WO CONTRAST Additional  Contrast? None   Final Result   Large hiatal hernia.      Hepatic steatosis.      Cholelithiasis.              Thank you for the interesting evaluation. Further recommendations to follow.      Electronically signed by Ashok Croon, APRN - CNP on 01/09/2018 at 1:12 PM    Agree with above note.  The patient was personally seen and examined.  Linda [name] is a 72 yo female well known to me for a hiatal hernia and Cameron's ulcers who presented to the hospital with 1 day history of coffee ground emesis and melenotic diarrhea.  She has been on ibuprofen due to recent orthopedic injuries and surgery.  She has been eating and drinking without  difficulty until her emesis.  She had an EGD yesterday showing a large hiatal hernia with Cameron's ulcers.      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  No NSAIDs  Hgb stable, continue to monitor  I discussed laparoscopic repair of the hiatal hernia with the patient.  The risks, benefits, and alternative were discussed with the patient and she is willing to consent for the procedure.  We will plan to medically optimize her and plan for surgery as an outpatient    Delton Coombes, MD

## 2018-01-09 NOTE — Progress Notes (Signed)
[facility]   Progress Note      SUBJECTIVE:  Complains of pain located at her right patellar / anterior.  They have not applied ice to her knee.  She has had injection in the left knee in the past.  She and Dr. Garen Grams are requesting injection.  She denies any hip pain.      OBJECTIVE:  BP (!) 148/71    Pulse 68    Temp 97.9 ??F (36.6 ??C) (Oral)    Resp 16    Ht 5\' 4"  (1.626 m)    Wt 138 lb 7.2 oz (62.8 kg)    SpO2 96%    BMI 23.76 kg/m??   Patient is awake, alert, and in no acute distress.  No effusion.  Patellar facet tenderness.  Sensation is intact to light touch throughout the Right knee.  Dorsiflexion / plantarflexion intact.   The Right leg is warm and well perfused.    CBC:   Lab Results   Component Value Date    WBC 6.2 01/08/2018    RBC 2.55 01/08/2018    HGB 8.8 01/09/2018    HCT 25.4 01/08/2018    MCV 99.9 01/08/2018    MCH 32.4 01/08/2018    MCHC 32.5 01/08/2018    RDW 15.2 01/08/2018    PLT 142 01/08/2018    MPV 8.3 01/08/2018     PT/INR:    Lab Results   Component Value Date    PROTIME 15.3 01/07/2018    INR 1.34 01/07/2018     Chem Basic:   Lab Results   Component Value Date    NA 138 01/07/2018    K 4.4 01/07/2018    K 3.7 11/21/2017    CL 102 01/07/2018    CO2 28 01/07/2018    GLUCOSE 131 01/07/2018    BUN 38 01/07/2018    CREATININE 0.6 01/07/2018         ASSESSMENT:  Right knee pain, 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  The patient had full opportunity to ask questions and all were answered.  The patient then provided verbal informed consent.  The skin was then prepped with alcohol.  Under aseptic conditions, the  right knee was injected with 4cc of 1% xylocaine, 4cc of 0.25% marcaine, and 1cc of Kenalog (40mg /ml).  There were no immediate complications following the injection.  The patient was advised of the possibility of injection site  reaction and instructed to apply ice to the area and take NSAIDs if able.     Apply ice to right knee.    Activity as tolerated.    Follow up in office as scheduled.         Electronically signed by Arville Lime, MD on 01/09/2018 at 1:11 PM

## 2018-01-09 NOTE — Plan of Care (Signed)
Problem: Falls - Risk of:  Goal: Will remain free from falls  Description  Will remain free from falls  Outcome: Ongoing   Fall risk assessment completed every shift. All precautions in place. Pt has call light within reach at all times. Room clear of clutter. Pt aware to call for assistance when getting up.      Problem: SAFETY  Goal: Free from accidental physical injury  Outcome: Ongoing   Patient assessed for fall risk; fall precautions initiated. Patient and family instructed about safety devices. Environment kept free of clutter and adequate lighting provided.  Bed locked and in lowest position.  Call light within reach. Will continue to monitor.     Problem: DAILY CARE  Goal: Daily care needs are met  Outcome: Ongoing   Patient's ability assessed to perform self care and independent activity encouraged according to that ability.  Assisted with ADL's as needed.  Risk for skin breakdown assessed.  Potential discharge needs assessed.  Patient and family included in daily care decisions.     Problem: PAIN  Goal: Patient's pain/discomfort is manageable  Outcome: Ongoing   Pain/discomfort being managed with PRN analgesics per MD orders. Pt able to express presence and absence of pain and rate pain appropriately using numerical scale.

## 2018-01-09 NOTE — Progress Notes (Signed)
Internal Medicine Progress Note  JW:JXBJY stool  HPI:71 yo female with a history of camerons ulcers and severe hiatal hernia , recent hip fx and wrist fx and recurrent migraines developed severe abdominal pain and inability to sleep secondary to the pain.  She got up and once in the bathroom had a large amount of coffee ground emesis followed by profuse amounts of black watery stool that lasted through the night.  She presented to the ER the following morning tachycardic and weak.  Her hb had not dropped much on presentation but did drop overnight.  She has continued to have black watery stool since here.  She also has developed a migraine since receiving a shot of morphine for her abdominal pain.    Recently she has been taking more nsaids because of pain in her right knee that has started up in the last few weeks.  She was supposed to have a nissan fundoplication due to hiatal hernia and multiple cammeron ulcers but she never went through with that.  In addition she has had recently diagnosed graves disease which has since cooled down  And is not on anything now.      Today she is feeling a little queasy after her UGI, a little headache, hoping to eat something soon.   We discussed the need to forge ahead with surgery and she is willing to get this done when deemed safe.  Still seeing black watery stool.  Quantity of stool has decreased.  Right knee hurts with flexion and walking, did have a spur.      Patient Active Problem List    Diagnosis Date Noted   ??? Hyperlipidemia      Priority: High   ??? Acute pain of right knee 01/08/2018   ??? UGI bleed 01/07/2018   ??? Closed fracture of intertrochanteric section of femur, right, sequela 11/24/2017   ??? Closed fracture of right distal radius    ??? Closed intertrochanteric fracture of hip, right, initial encounter (HCC) 11/21/2017   ??? Closed fracture of right wrist 11/21/2017   ??? Graves disease 09/17/2017   ??? Iron deficiency anemia secondary to blood  loss (chronic) 08/09/2017   ??? 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 Intractable chronic migraine without aura 02/27/2013   ??? Medication overuse headache 02/27/2013   ??? Intractable headache 12/23/2012   ??? Hypoxemia 12/23/2012   ??? Osteoporosis 12/23/2012   ??? Depression 07/25/2010   ??? Senile osteoporosis    ??? Menopausal and postmenopausal disorder    ??? GERD (gastroesophageal reflux disease)    ??? Migraine headache    ??? Colon polyps      Past Medical History:   Diagnosis Date   ??? Bisphosphonate-associated osteonecrosis of the jaw [facility])    ??? Celiac artery atherosclerosis 2015    60% proximal stenosis   ??? Colon polyps     adenomatous    ??? Dyspepsia     nonulcerative   ??? Graves disease    ??? High cholesterol    ??? History of depression    ??? Hypertension    ??? 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 Migraine     recurrent, controlled with elavil   ??? Osteoarthritis of lumbar spine     [place]   ??? 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 Postmenopausal    ??? Thoracic compression fracture (HCC)  T10 from a fall      Past Surgical History:   Procedure Laterality Date   ??? ARTERY BIOPSY Right 12-24-12    Temporal Artery biopsy right    ??? COLONOSCOPY  [id number]    dr Paulina Fusi   3 years   ??? COLONOSCOPY N/A 07/09/2017    COLONOSCOPY POLYPECTOMY SNARE performed by Rudell Cobb, MD at [facility] ENDOSCOPY   ??? COLONOSCOPY N/A 07/09/2017    COLONOSCOPY WITH BIOPSY performed by Rudell Cobb, MD at [facility] ENDOSCOPY   ??? ESOPHAGEAL MOTILITY STUDY N/A 08/27/2017    ESOPHAGEAL MANOMETRY performed by Rudell Cobb, MD at [facility] ENDOSCOPY   ??? FIXATION KYPHOPLASTY      t10   ??? HIP FRACTURE SURGERY Right 11/22/2017    RIGHT HIP GAMMA NAILING performed by Richardson Chiquito, MD at [facility] OR   ??? HIP SURGERY Right 11/22/2017    right hip gamma nailing   ??? ORIF DISTAL RADIUS FRACTURE Right 11/22/2017    OPEN REDUCTION INTERNAL FIXATION RIGHT RADIUS WITH C-ARM  performed by Filbert Berthold, MD at [facility] OR   ??? SHOULDER SURGERY      left humerus fx 3 places   ??? UPPER GASTROINTESTINAL ENDOSCOPY  08/26/2014    dr Onalee Hua hess:negative   ??? UPPER GASTROINTESTINAL ENDOSCOPY N/A 08/08/2017    EGD ESOPHAGOGASTRODUODENOSCOPY performed by Rudell Cobb, MD at [facility] ENDOSCOPY   ??? UPPER GASTROINTESTINAL ENDOSCOPY N/A 01/08/2018    EGD BIOPSY performed by Rudell Cobb, MD at [facility] ENDOSCOPY   ??? WRIST FRACTURE SURGERY Right 11/22/2017      Medications Prior to Admission: nadolol (CORGARD) 20 MG tablet, Take 10 mg by mouth nightly  celecoxib (CELEBREX) 200 MG capsule, TAKE 1 CAPSULE BY MOUTH DAILY  PARoxetine (PAXIL) 40 MG tablet, Take 1 tablet by mouth daily  traZODone (DESYREL) 50 MG tablet, TAKE 1 TABLET BY MOUTH EVERY NIGHT AS NEEDED FOR SLEEP  docusate sodium (COLACE, DULCOLAX) 100 MG CAPS, Take 100 mg by mouth 2 times daily  pantoprazole (PROTONIX) 40 MG tablet, Take 1 tablet by mouth every morning (before breakfast)  divalproex (DEPAKOTE ER) 500 MG extended release tablet, Take 1 tablet by mouth nightly  Polysaccharide Iron Complex 391.3 (180 Fe) MG CAPS, One a day for six months  Cobalamine Combinations (B12 FOLATE) 800-800 MCG CAPS, One a day indefinitely  Calcium Carbonate-Vitamin D (CALTRATE 600+D) 600-400 MG-UNIT CHEW, Take 1 tablet by mouth 2 times daily.  Allergies   Allergen Reactions   ??? Actonel [Risedronate Sodium]      Osteonecrosis jaw documented      Social History     Tobacco Use   ??? Smoking status: Former Smoker     Packs/day: 0.00     Years: 40.00     Pack years: 0.00     Last attempt to quit: 08/06/1997     Years since quitting: 20.4   ??? Smokeless tobacco: Never Used   ??? Tobacco comment: passive exposure husband chain smokes   Substance Use Topics   ??? Alcohol use: Yes     Alcohol/week: 0.0 oz     Comment: rarely, socially      Family History   Problem Relation Age of Onset   ??? Migraines Other    ??? Diabetes Other    ??? Breast Cancer Sister    ??? Cancer Mother    ??? Cancer Father          Prior to Admission medications  Medication Sig Start Date End Date Taking? Authorizing Provider   nadolol (CORGARD) 20 MG tablet Take 10 mg by mouth nightly   Yes Historical Provider, MD   celecoxib (CELEBREX) 200 MG capsule TAKE 1 CAPSULE BY MOUTH DAILY 01/01/18  Yes Ritta Slot, MD   PARoxetine (PAXIL) 40 MG tablet Take 1 tablet by mouth daily 12/24/17  Yes Ritta Slot, MD   traZODone (DESYREL) 50 MG tablet TAKE 1 TABLET BY MOUTH EVERY NIGHT AS NEEDED FOR SLEEP 12/24/17  Yes Ritta Slot, MD   docusate sodium (COLACE, DULCOLAX) 100 MG CAPS Take 100 mg by mouth 2 times daily 11/24/17  Yes Galen Daft, MD   pantoprazole (PROTONIX) 40 MG tablet Take 1 tablet by mouth every morning (before breakfast) 11/25/17  Yes Galen Daft, MD   divalproex (DEPAKOTE ER) 500 MG extended release tablet Take 1 tablet by mouth nightly 11/24/17  Yes Galen Daft, MD   Polysaccharide Iron Complex 391.3 (180 Fe) MG CAPS One a day for six months 08/02/17  Yes Ritta Slot, MD   Cobalamine Combinations (B12 FOLATE) 800-800 MCG CAPS One a day indefinitely 06/24/17  Yes Ritta Slot, MD   Calcium Carbonate-Vitamin D (CALTRATE 600+D) 600-400 MG-UNIT CHEW Take 1 tablet by mouth 2 times daily. 01/16/10  Yes Ritta Slot, MD     Review of Systems  A comprehensive review of systems was negative except for: black diarrhea and emesis    Objective:     Patient Vitals for the past 8 hrs:   BP Temp Temp src Pulse Resp SpO2 Weight   01/09/18 1215 (!) 148/71 97.9 ??F (36.6 ??C) Oral 68 16 96 % --   01/09/18 0804 119/70 98.2 ??F (36.8 ??C) Oral 76 16 96 % --   01/09/18 0743 -- -- -- -- 18 95 % --   01/09/18 0509 114/64 98.6 ??F (37 ??C) Oral 60 18 94 % 138 lb 7.2 oz (62.8 kg)     I/O last 3 completed shifts:  In: 4935 [P.O.:480; I.V.:4455]  Out: 3050 [Urine:3050]  I/O this shift:  In: 360 [P.O.:360]  Out: -         General Appearance: alert and oriented to person, place and time, well-developed and well-nourished, in no acute distress  Skin: warm and dry, no  rash or erythema  Head: normocephalic and atraumatic  Eyes: pupils equal, round, and reactive to light, extraocular eye movements intact, conjunctivae normal  ENT: tympanic membrane, external ear and ear canal normal bilaterally, oropharynx clear and moist with normal mucous membranes  Neck: neck supple and non tender without mass, no thyromegaly or thyroid nodules, no cervical lymphadenopathy   Pulmonary/Chest: clear to auscultation bilaterally- no wheezes, rales or rhonchi, normal air movement, no respiratory distress  Cardiovascular: normal rate, regular rhythm and normal S1 and S2  Abdomen: pos bs tender epigastric and RUQ area  Breast: not examined  Extremities: no cyanosis, clubbing or edema  Musculoskeletal: normal range of motion, no joint swelling, deformity or tenderness  Neurologic: gait and coordination normal and speech normal    ECG: no new ekg.   Data Review:      CBC with Differential:    Lab Results   Component Value Date    WBC 6.2 01/08/2018    RBC 2.55 01/08/2018    HGB 8.8 01/09/2018    HCT 25.4 01/08/2018    PLT 142 01/08/2018    MCV 99.9 01/08/2018    MCH 32.4 01/08/2018    MCHC 32.5  01/08/2018    RDW 15.2 01/08/2018    SEGSPCT 41.6 05/12/2010    LYMPHOPCT 36.1 01/08/2018    MONOPCT 4.0 01/08/2018    EOSPCT 3.5 05/12/2010    BASOPCT 0.1 01/08/2018    MONOSABS 0.2 01/08/2018    LYMPHSABS 2.2 01/08/2018    EOSABS 0.1 01/08/2018    BASOSABS 0.0 01/08/2018    DIFFTYPE Auto-K 05/12/2010     CMP:    Lab Results   Component Value Date    NA 138 01/07/2018    K 4.4 01/07/2018    K 3.7 11/21/2017    CL 102 01/07/2018    CO2 28 01/07/2018    BUN 38 01/07/2018    CREATININE 0.6 01/07/2018    GFRAA >60 01/07/2018    GFRAA >60 05/12/2010    AGRATIO 1.3 01/07/2018    LABGLOM >60 01/07/2018    GLUCOSE 131 01/07/2018    PROT 7.0 01/07/2018    PROT 7.8 05/12/2010    LABALBU 3.9 01/07/2018    CALCIUM 9.0 01/07/2018    BILITOT 0.5 01/07/2018    ALKPHOS 206 01/07/2018    AST 14 01/07/2018    ALT 8 01/07/2018      PT/INR:    Lab Results   Component Value Date    PROTIME 15.3 01/07/2018    INR 1.34 01/07/2018     TSH:    Lab Results   Component Value Date    TSH 18.84 12/24/2017         Hb 7.9 as a low and 8.8 now    Assessment:     Patient Active Hospital Problem List:   UGI bleed (01/07/2018)    Assessment: was again the [place] Ulcers with fresh blood seen on EGD    Plan: close monitoring, fluid replacement and surgical consult again   Migraine headache ()    Assessment: likely due to morphine    Plan: fioricet x 1 a day prn since tryptans dont help her   Iron deficiency anemia secondary to blood loss (chronic) (08/09/2017)    Assessment: ongoing    Plan: replace   Graves disease (09/17/2017)    Assessment: confusing picture, now looks more like hypothyroidism    Plan: will ask patient again to see endocrin as op   Acute pain of right knee (01/08/2018)    Assessment:likely pain from spur    Plan:may benefit from cortisone injection              Ritta Slot, MD

## 2018-01-10 LAB — BASIC METABOLIC PANEL
Anion Gap: 9 (ref 3–16)
BUN: 3 mg/dL — ABNORMAL LOW (ref 7–20)
CO2: 28 mmol/L (ref 21–32)
Calcium: 8.8 mg/dL (ref 8.3–10.6)
Chloride: 104 mmol/L (ref 99–110)
Creatinine: <0.5 mg/dL — ABNORMAL LOW (ref 0.6–1.2)
GFR African American: >60 (ref >60)
GFR Non-African American: >60 (ref >60)
Glucose: 148 mg/dL — ABNORMAL HIGH (ref 70–99)
Potassium: 3.3 mmol/L — ABNORMAL LOW (ref 3.5–5.1)
Sodium: 141 mmol/L (ref 136–145)

## 2018-01-10 LAB — CBC WITH AUTO DIFFERENTIAL
Basophils %: 0.3 %
Basophils Absolute: 0 10*3/uL (ref 0.0–0.2)
Eosinophils %: 0 %
Eosinophils Absolute: 0 10*3/uL (ref 0.0–0.6)
Hematocrit: 25.6 % — ABNORMAL LOW (ref 36.0–48.0)
Hemoglobin: 8.5 g/dL — ABNORMAL LOW (ref 12.0–16.0)
Lymphocytes %: 16.3 %
Lymphocytes Absolute: 0.8 10*3/uL — ABNORMAL LOW (ref 1.0–5.1)
MCH: 32.7 pg (ref 26.0–34.0)
MCHC: 33.2 g/dL (ref 31.0–36.0)
MCV: 98.5 fL (ref 80.0–100.0)
MPV: 8.1 fL (ref 5.0–10.5)
Monocytes %: 1.5 %
Monocytes Absolute: 0.1 10*3/uL (ref 0.0–1.3)
Neutrophils %: 81.9 %
Neutrophils Absolute: 4.3 10*3/uL (ref 1.7–7.7)
Platelets: 124 10*3/uL — ABNORMAL LOW (ref 135–450)
RBC: 2.6 M/uL — ABNORMAL LOW (ref 4.00–5.20)
RDW: 15.2 % (ref 12.4–15.4)
WBC: 5.2 10*3/uL (ref 4.0–11.0)

## 2018-01-10 LAB — HEMOGLOBIN
Hemoglobin: 8.6 g/dL — ABNORMAL LOW (ref 12.0–16.0)
Hemoglobin: 9 g/dL — ABNORMAL LOW (ref 12.0–16.0)

## 2018-01-10 LAB — MAGNESIUM: Magnesium: 1.9 mg/dL (ref 1.80–2.40)

## 2018-01-10 MED ORDER — PANTOPRAZOLE SODIUM 40 MG IV SOLR
40 MG | Freq: Two times a day (BID) | INTRAVENOUS | Status: DC
Start: 2018-01-10 — End: 2018-01-10

## 2018-01-10 MED ORDER — POTASSIUM BICARB-CITRIC ACID 20 MEQ PO TBEF
20 MEQ | Freq: Once | ORAL | Status: AC
Start: 2018-01-10 — End: 2018-01-10
  Administered 2018-01-10: 13:00:00 40 meq via ORAL

## 2018-01-10 MED ORDER — POTASSIUM CHLORIDE 20 MEQ PO PACK
20 MEQ | Freq: Once | ORAL | Status: DC
Start: 2018-01-10 — End: 2018-01-10

## 2018-01-10 MED ORDER — SODIUM CHLORIDE 0.9 % IJ SOLN
0.9 % | Freq: Two times a day (BID) | INTRAMUSCULAR | Status: DC
Start: 2018-01-10 — End: 2018-01-10

## 2018-01-10 MED FILL — VENOFER 20 MG/ML IV SOLN: 20 mg/mL | INTRAVENOUS | Qty: 10

## 2018-01-10 MED FILL — PAROXETINE HCL 20 MG PO TABS: 20 mg | ORAL | Qty: 2

## 2018-01-10 MED FILL — PROTONIX 40 MG IV SOLR: 40 mg | INTRAVENOUS | Qty: 80

## 2018-01-10 MED FILL — NADOLOL 20 MG PO TABS: 20 mg | ORAL | Qty: 1

## 2018-01-10 MED FILL — EFFER-K 20 MEQ PO TBEF: 20 meq | ORAL | Qty: 2

## 2018-01-10 MED FILL — DIVALPROEX SODIUM ER 500 MG PO TB24: 500 mg | ORAL | Qty: 1

## 2018-01-10 MED FILL — TRAZODONE HCL 50 MG PO TABS: 50 mg | ORAL | Qty: 1

## 2018-01-10 NOTE — Discharge Instructions (Signed)
Followup Dr Bridgett Larsson in office for persistent right knee pain    [address], [place], [place],   [phone]    No NSAIDS I.e. ibuprofen d/t hiatal hernia with ulcers. Plan for repair in the next few weeks.

## 2018-01-10 NOTE — Discharge Instructions (Signed)

## 2018-01-10 NOTE — Progress Notes (Signed)
[facility] [phone]                                     Daily Progress Note                                                         Pt Name: Linda [name]  Medical Record Number: [id number]  Date of Birth [birth year]   Today's Date: 01/10/2018  Chief Complaint   Patient presents with   ??? Diarrhea     pt states "black diarrhea" present since 2:30am   ??? Emesis     emesis x15 since 2:30am; pt states emesis is "as black as my diarrhea"        ASSESSMENT/PLAN  Hiatal hernia with Lysbeth Galas ulcers  -plan for repair as an outpatient in a few weeks  -will call her to arrange  -still having diarrhea d/t blood in GI system    Anemia  -d/t acute blood loss from ulcers         Discharge Planning: OK to DC from surgery standpoint      SUBJECTIVE  Linda [name] has improved from yesterday.   Denies pain. Wants to go home.  OBJECTIVE  VITALS:  height is 5\' 4"  (1.626 m) and weight is 138 lb 0.1 oz (62.6 kg). Her oral temperature is 98.1 ??F (36.7 ??C). Her blood pressure is 128/78 and her pulse is 77. Her respiration is 16 and oxygen saturation is 95%. INTAKE/OUTPUT:    Intake/Output Summary (Last 24 hours) at 01/10/2018 1209  Last data filed at 01/10/2018 1013  Gross per 24 hour   Intake 5522 ml   Output 1201 ml   Net 4321 ml     GENERAL: alert, no distress  LUNGS: clear to ausculation, without wheezes, rales or rhonci  HEART: normal rate and regular rhythm  ABDOMEN: soft, non-tender, non-distended and bowel sounds present in all 4 quadrants  EXTREMITY: no cyanosis, clubbing or edema  I/O last 3 completed shifts:  In: 8756 [P.O.:1140; I.V.:4142]  Out: 1201 [Urine:1201]  No intake/output data recorded.    LABS  Recent Labs     01/07/18  0938 01/07/18  0939  01/10/18  0523   WBC  --  7.7   < > 5.2   HGB  --  11.3*   < > 8.5*   HCT  --  33.1*   < > 25.6*   PLT  --  193   < > 124*   NA 138  --   --  141   K 4.4  --   --  3.3*   CL 102  --   --  104   CO2 28  --   --  28    BUN 38*  --   --  3*   CREATININE 0.6  --   --  <0.5*   CALCIUM 9.0  --   --  8.8   INR  --  1.34*  --   --    AST 14*  --   --   --    ALT 8*  --   --   --    BILITOT 0.5  --   --   --     < > =  values in this interval not displayed.       EDUCATION  Patient educated about Disease Process, Medications, Smoking Cessation, Oxygenation, Incentive Spirometry and Deep Breath and Cough, Diabetes, Hyperlipidemia, Smoking Cessation, Nutrition, Exercise and Hypertension    Electronically signed by Ashok Croon, APRN - CNP on 01/10/18 at 8:34 AM     [facility]   [phone] Office  [phone] Cell     Agree with above note.  The patient was personally seen and examined.  Linda [name] is doing well.  Denies abdominal pain.  Tolerating regular diet.  BMs still loose but becoming more brown.    Abd soft, NT, ND    Hgb stable at 8.5    A/P: 72 yo female with UGI bleed from [place] ulcers from hiatal hernia    No longer appears to be bleeding.  D/c PPI gtt and transition to BID.  Tolerating diet.  Continue to avoid NSAIDs.  OK to discharge home per surgery.  She is on the OR schedule for 01/24/18 at 1130 AM.  The risks, benefits, and alternatives were discussed with the patient and she is willing to consent for the procedure.     Delton Coombes, MD

## 2018-01-10 NOTE — Discharge Summary (Signed)
Physician Discharge Summary     Patient ID:   Linda [name]  [id number]  72 y.o.  1945/11/28    Admit date: 01/07/2018    Discharge date and time: 01/10/2018    Admitting Physician: Angelica Pou, MD     Discharge Physician: Angelica Pou, MD    Discharge Diagnoses:   Acute on Chronic Blood Loss Anemia  UGI bleed  Acute Lysbeth Galas Ulcer  Acute pain right knee  Migraine headaches  Graves Disease      Discharged Condition: good      Hospital Course: 72 yo female admitted with the above medical problems presented with UGIB with melena and coffee ground emesis.  She was hydrated and treated with iv protonix and seen by Dr Awanda Mink who performed EGD.  It demonstrated acute Lysbeth Galas Ulcer and she was instructed to avoid NSAIDs 100%and stay on PPI.  She was also seen by general surgery who planned on arranging for her to get [employer] Fundoplication within the near future.  She was also seen by ortho who evaluated her painful right knee and xray showed a spur and she received a cortisone shot which helped her pain.  She received venofer x 4 but no blood  Hb dropped to 8 at the lowest and was 9 on dc.  She had migraines during the stay and was given fioricet for them since tryptans didn't help.     Consults: GI, general surgery and orthopedic surgery      Discharge Exam:  BP 128/78    Pulse 77    Temp 98.1 ??F (36.7 ??C) (Oral)    Resp 16    Ht 5\' 4"  (1.626 m)    Wt 138 lb 0.1 oz (62.6 kg)    SpO2 95%    BMI 23.69 kg/m??   General appearance: alert, appears stated age, cooperative and pale  Head: Normocephalic, without obvious abnormality, atraumatic  Eyes: conjunctivae/corneas clear. PERRL, EOM's intact. Fundi benign.  Ears: hearing intact  Nose: Nares normal. Septum midline. Mucosa normal. No drainage or sinus tenderness.  Throat: lips, mucosa, and tongue normal; teeth and gums normal  Neck: no adenopathy, no carotid bruit, no JVD, supple, symmetrical, trachea midline and thyroid not enlarged, symmetric, no tenderness/mass/nodules  Back:  symmetric, no curvature. ROM normal. No CVA tenderness.  Lungs: clear to auscultation bilaterally  Heart: regular rate and rhythm, S1, S2 normal, no murmur, click, rub or gallop  Abdomen: soft, non-tender; bowel sounds normal; no masses,  no organomegaly  Extremities: extremities normal, atraumatic, no cyanosis or edema  Skin: Skin color, texture, turgor normal. No rashes or lesions  Neurologic: Grossly normal    Disposition: home    Hospital Meds:  Current Facility-Administered Medications   Medication Dose Route Frequency Provider Last Rate Last Dose   ??? pantoprazole (PROTONIX) injection 40 mg  40 mg Intravenous BID Ashok Croon, APRN - CNP        And   ??? sodium chloride (PF) 0.9 % injection 10 mL  10 mL Intravenous Daily Ashok Croon, APRN - CNP       ??? diatrizoate meglumine-sodium (GASTROGRAFIN) 66-10 % solution 120 mL  120 mL Oral ONCE PRN Orvan Seen, MD   120 mL at 01/09/18 0910   ??? butalbital-acetaminophen-caffeine (FIORICET, ESGIC) per tablet 1 tablet  1 tablet Oral Daily PRN Angelica Pou, MD       ??? 0.9 % sodium chloride infusion   Intravenous Continuous Ashok Cordia, MD       ???  divalproex (DEPAKOTE ER) extended release tablet 500 mg  500 mg Oral Nightly Orvan Seen, MD   500 mg at 01/09/18 2118   ??? nadolol (CORGARD) tablet 10 mg  10 mg Oral Nightly Orvan Seen, MD   10 mg at 01/09/18 2118   ??? PARoxetine (PAXIL) tablet 40 mg  40 mg Oral Daily Orvan Seen, MD   40 mg at 01/09/18 1009   ??? traZODone (DESYREL) tablet 50 mg  50 mg Oral Nightly PRN Orvan Seen, MD   50 mg at 01/09/18 2118   ??? sodium chloride flush 0.9 % injection 10 mL  10 mL Intravenous 2 times per day Orvan Seen, MD   10 mL at 01/09/18 1009   ??? sodium chloride flush 0.9 % injection 10 mL  10 mL Intravenous PRN Orvan Seen, MD   10 mL at 01/08/18 1548   ??? acetaminophen (TYLENOL) tablet 650 mg  650 mg Oral Q4H PRN Orvan Seen, MD       ??? ondansetron [facility]) injection 4 mg  4 mg Intravenous Q6H PRN Orvan Seen, MD    4 mg at 01/08/18 9242   ??? 0.9 % sodium chloride infusion   Intravenous Continuous Orvan Seen, MD 150 mL/hr at 01/09/18 1714     ??? iron sucrose (VENOFER) injection 200 mg  200 mg Intravenous Daily Orvan Seen, MD   200 mg at 01/09/18 1009       Take Home Meds:  Current Discharge Medication List      CONTINUE these medications which have NOT CHANGED    Details   nadolol (CORGARD) 20 MG tablet Take 10 mg by mouth nightly      PARoxetine (PAXIL) 40 MG tablet Take 1 tablet by mouth daily  Qty: 90 tablet, Refills: 3      traZODone (DESYREL) 50 MG tablet TAKE 1 TABLET BY MOUTH EVERY NIGHT AS NEEDED FOR SLEEP  Qty: 90 tablet, Refills: 5    Comments: **Patient requests 90 days supply**      docusate sodium (COLACE, DULCOLAX) 100 MG CAPS Take 100 mg by mouth 2 times daily  Qty: 60 capsule, Refills: 0      pantoprazole (PROTONIX) 40 MG tablet Take 1 tablet by mouth every morning (before breakfast)  Qty: 30 tablet, Refills: 3      divalproex (DEPAKOTE ER) 500 MG extended release tablet Take 1 tablet by mouth nightly  Qty: 30 tablet, Refills: 3      Polysaccharide Iron Complex 391.3 (180 Fe) MG CAPS One a day for six months  Qty: 30 capsule, Refills: 5      Cobalamine Combinations (B12 FOLATE) 800-800 MCG CAPS One a day indefinitely  Qty: 30 capsule, Refills: 3      Calcium Carbonate-Vitamin D (CALTRATE 600+D) 600-400 MG-UNIT CHEW Take 1 tablet by mouth 2 times daily.  Qty: 60 tablet, Refills: 11         STOP taking these medications       celecoxib (CELEBREX) 200 MG capsule Comments:   Reason for Stopping:                 Activity: activity as tolerated  Diet: regular diet  Wound Care: none needed    Follow-up with General Surgery  And Ortho and IM   Time Spent: over 35 minutes spent performing hospital discharge  Signed:  Angelica Pou, MD  01/10/2018  8:40 AM

## 2018-01-10 NOTE — Progress Notes (Incomplete)
Data- discharge order received, pt verbalized agreement to discharge, disposition to previous residence, no needs for HHC/DME.     Action- discharge instructions prepared and given to pt, pt verbalized understanding. Medication information packet given r/t NEW and/or CHANGED prescriptions emphasizing name/purpose/side effects, pt verbalized understanding. Discharge instruction summary: Diet- general, Activity- as tolerated, Primary Care Physician as follows: Ritta Slot, MD [phone] f/u appointment in 1 week, f/u with Dr Paulina Fusi in 2 weeks , immunizations reviewed and updated, prescription medications filled N/A.      Response- Pt belongings gathered, IV removed. Disposition is home (no HHC/DME needs), transported with husband, taken to lobby via w/c w/ transport, no complications.     Electronically signed by Eilene Ghazi, RN on 01/10/2018 at 10:47 AM

## 2018-01-10 NOTE — Plan of Care (Signed)
Problem: Falls - Risk of:  Goal: Will remain free from falls  Description  Will remain free from falls  01/10/2018 0017 by Pollie Friar, RN  Outcome: Ongoing  01/09/2018 1727 by Lowella Bandy, RN  Outcome: Ongoing  Goal: Absence of physical injury  Description  Absence of physical injury  Outcome: Ongoing     Problem: SAFETY  Goal: Free from accidental physical injury  01/10/2018 0017 by Pollie Friar, RN  Outcome: Ongoing  01/09/2018 1727 by Lowella Bandy, RN  Outcome: Ongoing  Goal: Free from intentional harm  Outcome: Ongoing     Problem: DAILY CARE  Goal: Daily care needs are met  01/10/2018 0017 by Pollie Friar, RN  Outcome: Ongoing  01/09/2018 1727 by Lowella Bandy, RN  Outcome: Ongoing     Problem: PAIN  Goal: Patient's pain/discomfort is manageable  01/10/2018 0017 by Pollie Friar, RN  Outcome: Ongoing  01/09/2018 1727 by Lowella Bandy, RN  Outcome: Ongoing     Problem: SKIN INTEGRITY  Goal: Skin integrity is maintained or improved  Outcome: Ongoing     Problem: KNOWLEDGE DEFICIT  Goal: Patient/S.O. demonstrates understanding of disease process, treatment plan, medications, and discharge instructions.  Outcome: Ongoing     Problem: DISCHARGE BARRIERS  Goal: Patient's continuum of care needs are met  Outcome: Ongoing     Problem: Pain:  Goal: Pain level will decrease  Description  Pain level will decrease  Outcome: Ongoing  Goal: Control of acute pain  Description  Control of acute pain  Outcome: Ongoing  Goal: Control of chronic pain  Description  Control of chronic pain  Outcome: Ongoing

## 2018-01-10 NOTE — Plan of Care (Signed)
Problem: Falls - Risk of:  Goal: Will remain free from falls  Description  Will remain free from falls  01/10/2018 1036 by Eilene Ghazi, RN  Outcome: Ongoing  01/10/2018 0017 by Pollie Friar, RN  Outcome: Ongoing  Goal: Absence of physical injury  Description  Absence of physical injury  01/10/2018 1036 by Eilene Ghazi, RN  Outcome: Ongoing  01/10/2018 0017 by Pollie Friar, RN  Outcome: Ongoing     Problem: SAFETY  Goal: Free from accidental physical injury  01/10/2018 1036 by Eilene Ghazi, RN  Outcome: Ongoing  01/10/2018 0017 by Pollie Friar, RN  Outcome: Ongoing  Goal: Free from intentional harm  01/10/2018 1036 by Eilene Ghazi, RN  Outcome: Ongoing  01/10/2018 0017 by Pollie Friar, RN  Outcome: Ongoing     Problem: DAILY CARE  Goal: Daily care needs are met  01/10/2018 1036 by Eilene Ghazi, RN  Outcome: Ongoing  01/10/2018 0017 by Pollie Friar, RN  Outcome: Ongoing     Problem: PAIN  Goal: Patient's pain/discomfort is manageable  01/10/2018 1036 by Eilene Ghazi, RN  Outcome: Ongoing  01/10/2018 0017 by Pollie Friar, RN  Outcome: Ongoing     Problem: SKIN INTEGRITY  Goal: Skin integrity is maintained or improved  01/10/2018 1036 by Eilene Ghazi, RN  Outcome: Ongoing  01/10/2018 0017 by Pollie Friar, RN  Outcome: Ongoing     Problem: KNOWLEDGE DEFICIT  Goal: Patient/S.O. demonstrates understanding of disease process, treatment plan, medications, and discharge instructions.  01/10/2018 1036 by Eilene Ghazi, RN  Outcome: Ongoing  01/10/2018 0017 by Pollie Friar, RN  Outcome: Ongoing     Problem: DISCHARGE BARRIERS  Goal: Patient's continuum of care needs are met  01/10/2018 1036 by Eilene Ghazi, RN  Outcome: Ongoing  01/10/2018 0017 by Pollie Friar, RN  Outcome: Ongoing     Problem: Pain:  Description  Pain management should include both nonpharmacologic and pharmacologic interventions.  Goal: Pain level will decrease  Description  Pain level will decrease  01/10/2018 1036 by Eilene Ghazi,  RN  Outcome: Ongoing  01/10/2018 0017 by Pollie Friar, RN  Outcome: Ongoing  Goal: Control of acute pain  Description  Control of acute pain  01/10/2018 1036 by Eilene Ghazi, RN  Outcome: Ongoing  01/10/2018 0017 by Pollie Friar, RN  Outcome: Ongoing  Goal: Control of chronic pain  Description  Control of chronic pain  01/10/2018 1036 by Eilene Ghazi, RN  Outcome: Ongoing  01/10/2018 0017 by Pollie Friar, RN  Outcome: Ongoing

## 2018-01-20 ENCOUNTER — Encounter

## 2018-01-20 MED ORDER — LEVOTHYROXINE SODIUM 25 MCG PO TABS
25 MCG | ORAL_TABLET | Freq: Every day | ORAL | 5 refills | Status: DC
Start: 2018-01-20 — End: 2018-01-20

## 2018-01-20 MED ORDER — LEVOTHYROXINE SODIUM 25 MCG PO TABS
25 MCG | ORAL_TABLET | ORAL | 1 refills | Status: DC
Start: 2018-01-20 — End: 2018-02-13

## 2018-01-20 NOTE — Telephone Encounter (Signed)
Patient aware of date and time of surgery. Advised her Lynden Ang will still call her regarding all details.

## 2018-01-20 NOTE — Telephone Encounter (Signed)
 PT states that she has been waiting over a week for a call regarding her surgery.

## 2018-01-21 NOTE — Telephone Encounter (Signed)
Spoke with patient regarding scheduled surgery on 01-24-2018 with Dr. Janan Halter and Dr. Dallas Schimke.  Patient is asked to arrive by 10:00 AM @ [facility] after midnight.    Please bring a Photo ID & Insurance card with you, and check-in at the Surgery Desk down the right-hand hallway on the first floor.  Patient expressed a verbal understanding of these instructions and had no further questions at this time.  Mailed instruction letter.  Call ended.

## 2018-01-21 NOTE — Progress Notes (Signed)
DIAGNOSIS:  Right intertrochanteric femur fracture, status post Gamma nail.    DATE OF SURGERY:  11/22/17    HISTORY OF PRESENT ILLNESS: [name] 72 y.o. female  who comes in today for postoperative visit.  The patient rates pain 1/10.  She has been weight bearing as tolerated for her leg.  She stopped wearing wrist brace 3 weeks ago.  She has been doing "light things" with her hand.  She complains mostly of right knee pain.      PHYSICAL EXAMINATION:    Pulse 62    Resp 17    Ht 5' 4.02" (1.626 m)    Wt 138 lb 0.1 oz (62.6 kg)    BMI 23.68 kg/m??      Patient is awake, alert, and in no acute distress.  The leg incisions are healed.  She has no pain with the active or passive range of motion of the right hip.  She has intact sensation to light touch distally in bilateral legs.  Bilateral legs are warm and well perfused.    Right wrist incision healed.  No tenderness at distal radius.  Right wrist ROM full.  EPL / FPL / Interossei intact bilaterally.  SILT bilateral hands.  Bilateral hands warm and well perfused.      IMAGING:    Two views (AP and lateral) right  Hip, and AP pelvis were obtained and reviewed.  Showed anatomic alignment of the intertrochanteric fracture, Gamma nail in good position, no loosening, or hardware failure.      AP, lateral and oblique views of right wrist obtained and reviewed.  Anatomic alignment of distal radius fracture.  Hardware intact.          IMPRESSION:    1.  2 months status post right Gamma nail of intertrochanteric femur fracture.  2.  2 months s/p ORIF right distal radius fracture by Dr. Barnet Pall.   3.  Noncompliance     PLAN:    Will have the patient continue to work on ROM and strengthening with PT.    Weight bearing as tolerated right leg.    She can advance to WBAT right wrist.    PT referral for right leg strengthening.    The patient will come back for a follow up in 2 months.  At that time, we will take Two views right hip, and AP pelvis and also 3 views right wrist.

## 2018-01-21 NOTE — Progress Notes (Signed)
OUTPATIENT PROGRESS NOTE  Date of Service:  01/21/2018  Address: [facility]  [place] [address]  Dept: [phone]  Loc: [phone]    Subjective:     Chief Complaint   Patient presents with   ??? Pre-op Exam   ??? Follow-Up from Hospital     PatientID: [id number]  Linda [name] is a 72 y.o. female    HPIhere for follow up since dc from the hospital and preop prior to [facility] by Dr Ephraim Hamburger and Dallas Schimke on Friday June 21st at [facility] [place].  She was hospitalized recently after presenting with coffee ground emesis and melena.  EGD revealed acute cammeron ulcer.  She stabilized and never received blood but did get some iron while there. Last hb 9.    Problem List Items Addressed This Visit     Intractable chronic migraine without aura.  Headaches are controlled again on depakote and she knows to avoid nsaids    Iron deficiency anemia secondary to blood loss (chronic) stools are back to baseline brown formed    UGI bleed no further nausea or vomiting      Other Visit Diagnoses     Iron deficiency anemia due to chronic blood loss    -  Primary    Relevant Orders    CBC Auto Differential    Hypokalemia    Seen on latest lab    Relevant Orders    Basic Metabolic Panel       Having difficulty with therapy and mobilizing knees and hips.  Right hip feels better right knee hurts less since cortisone shot.    Despite all these issues recently no issues with sob doe or cp and no prior history of cad.      Allergies   Allergen Reactions   ??? Actonel [Risedronate Sodium]      Osteonecrosis jaw documented     Current Outpatient Medications   Medication Sig Dispense Refill   ??? levothyroxine (SYNTHROID) 25 MCG tablet TAKE 1 TABLET BY MOUTH EVERY DAY ON AN EMPTY STOMACH 30 tablet 1   ??? nadolol (CORGARD) 20 MG tablet Take 10 mg by mouth nightly     ??? PARoxetine (PAXIL) 40 MG tablet Take 1 tablet by mouth daily 90 tablet 3   ??? traZODone (DESYREL) 50 MG tablet TAKE 1  TABLET BY MOUTH EVERY NIGHT AS NEEDED FOR SLEEP 90 tablet 5   ??? docusate sodium (COLACE, DULCOLAX) 100 MG CAPS Take 100 mg by mouth 2 times daily 60 capsule 0   ??? pantoprazole (PROTONIX) 40 MG tablet Take 1 tablet by mouth every morning (before breakfast) 30 tablet 3   ??? divalproex (DEPAKOTE ER) 500 MG extended release tablet Take 1 tablet by mouth nightly 30 tablet 3   ??? Polysaccharide Iron Complex 391.3 (180 Fe) MG CAPS One a day for six months 30 capsule 5   ??? Cobalamine Combinations (B12 FOLATE) 800-800 MCG CAPS One a day indefinitely 30 capsule 3   ??? Calcium Carbonate-Vitamin D (CALTRATE 600+D) 600-400 MG-UNIT CHEW Take 1 tablet by mouth 2 times daily. 60 tablet 11     No current facility-administered medications for this visit.      Past Medical History:   Diagnosis Date   ??? 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 Celiac artery atherosclerosis 2015    60% proximal stenosis   ??? Colon polyps     adenomatous    ???  Dyspepsia     nonulcerative   ??? Graves disease    ??? High cholesterol    ??? History of depression    ??? Hypertension    ??? Hypothyroidism due to Hashimoto's thyroiditis 2019   ??? 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 Migraine     recurrent, controlled with elavil   ??? Osteoarthritis of lumbar spine     [place]   ??? 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 Postmenopausal    ??? Thoracic compression fracture ([place])     T10 from a fall     Past Surgical History:   Procedure Laterality Date   ??? ARTERY BIOPSY Right 12-24-12    Temporal Artery biopsy right    ??? COLONOSCOPY  [id number]    dr Awanda Mink   3 years   ??? COLONOSCOPY N/A 07/09/2017    COLONOSCOPY POLYPECTOMY SNARE performed by Orvan Seen, MD at [place]   ??? COLONOSCOPY N/A 07/09/2017    COLONOSCOPY WITH BIOPSY performed by Orvan Seen, MD at [place]   ??? ESOPHAGEAL MOTILITY STUDY N/A 08/27/2017    ESOPHAGEAL MANOMETRY performed  by Orvan Seen, MD at [place]   ??? FIXATION KYPHOPLASTY      t10   ??? HIP FRACTURE SURGERY Right 11/22/2017    RIGHT HIP GAMMA NAILING performed by Arville Lime, MD at [place]   ??? HIP SURGERY Right 11/22/2017    right hip gamma nailing   ??? ORIF DISTAL RADIUS FRACTURE Right 11/22/2017    OPEN REDUCTION INTERNAL FIXATION RIGHT RADIUS WITH C-ARM performed by Vevelyn Francois, MD at [place]   ??? SHOULDER SURGERY      left humerus fx 3 places   ??? UPPER GASTROINTESTINAL ENDOSCOPY  08/26/2014    dr Shanon Brow hess:negative   ??? UPPER GASTROINTESTINAL ENDOSCOPY N/A 08/08/2017    EGD ESOPHAGOGASTRODUODENOSCOPY performed by Orvan Seen, MD at [place]   ??? UPPER GASTROINTESTINAL ENDOSCOPY N/A 01/08/2018    EGD BIOPSY performed by Orvan Seen, MD at [place]   ??? WRIST FRACTURE SURGERY Right 11/22/2017     Social History     Tobacco Use   ??? Smoking status: Former Smoker     Packs/day: 0.00     Years: 40.00     Pack years: 0.00     Last attempt to quit: 08/06/1997     Years since quitting: 20.4   ??? Smokeless tobacco: Never Used   ??? Tobacco comment: passive exposure husband chain smokes   Substance Use Topics   ??? Alcohol use: Yes     Alcohol/week: 0.0 oz     Comment: rarely, socially   ??? Drug use: No     Family History   Problem Relation Age of Onset   ??? Migraines Other    ??? Diabetes Other    ??? Breast Cancer Sister    ??? Cancer Mother    ??? Cancer Father           Review of Systems   Constitutional: Positive for fatigue.   Musculoskeletal: Positive for arthralgias.   All other systems reviewed and are negative.      Objective:   Physical Exam   Constitutional: She is oriented to person, place, and time. She appears well-developed and well-nourished. No distress.   HENT:   Head:  Normocephalic and atraumatic.   Right Ear: External ear normal.   Left Ear: External ear normal.   Nose: Nose normal.   Mouth/Throat: Oropharynx is clear and moist.   Eyes: Pupils are equal, round, and reactive to light. EOM are normal. Right eye  exhibits no discharge. Left eye exhibits no discharge.   Neck: Normal range of motion. Neck supple. No JVD present. No tracheal deviation present. No thyromegaly present.   Cardiovascular: Normal rate, regular rhythm, normal heart sounds and intact distal pulses. Exam reveals no gallop.   No murmur heard.  Pulmonary/Chest: Effort normal and breath sounds normal. No stridor. No respiratory distress.   Abdominal: Soft. Bowel sounds are normal. She exhibits no distension. There is no tenderness.   Musculoskeletal: Normal range of motion. She exhibits no edema.   Lymphadenopathy:     She has no cervical adenopathy.   Neurological: She is alert and oriented to person, place, and time. She has normal reflexes.   Skin: Skin is warm and dry. She is not diaphoretic.   Psychiatric: She has a normal mood and affect. Her behavior is normal. Judgment and thought content normal.   Nursing note and vitals reviewed.    wght down seven lbs  EKG April: NSR NSsttwchanges  Assessment/Plan     1. Iron deficiency anemia due to chronic blood loss  Seems to be stable  Will recheck cbc    2. Hypokalemia  Was low in hospital   rec heck today preop  - Basic Metabolic Panel; Future    3. UGI bleed  Resolved and off nsaids  Plan : definitive surgery Friday    4. Intractable chronic migraine without aura and without status migrainosus  Controlled  Cont prophy      5 Preop evaluation : neg cardiac history  Moderate risk patient for moderate risk procedure  Medically stable, providing hb level adequate                     Angelica Pou, MD

## 2018-01-22 LAB — BASIC METABOLIC PANEL
Anion Gap: 14 (ref 3–16)
BUN: 11 mg/dL (ref 7–20)
CO2: 24 mmol/L (ref 21–32)
Calcium: 9.6 mg/dL (ref 8.3–10.6)
Chloride: 99 mmol/L (ref 99–110)
Creatinine: 0.6 mg/dL (ref 0.6–1.2)
GFR African American: >60 (ref >60)
GFR Non-African American: >60 (ref >60)
Glucose: 102 mg/dL — ABNORMAL HIGH (ref 70–99)
Potassium: 4 mmol/L (ref 3.5–5.1)
Sodium: 137 mmol/L (ref 136–145)

## 2018-01-22 LAB — CBC WITH AUTO DIFFERENTIAL
Basophils %: 0.6 %
Basophils Absolute: 0 10*3/uL (ref 0.0–0.2)
Eosinophils %: 1.3 %
Eosinophils Absolute: 0.1 10*3/uL (ref 0.0–0.6)
Hematocrit: 34.8 % — ABNORMAL LOW (ref 36.0–48.0)
Hemoglobin: 11.5 g/dL — ABNORMAL LOW (ref 12.0–16.0)
Lymphocytes %: 30.7 %
Lymphocytes Absolute: 2 10*3/uL (ref 1.0–5.1)
MCH: 34.3 pg — ABNORMAL HIGH (ref 26.0–34.0)
MCHC: 33 g/dL (ref 31.0–36.0)
MCV: 103.7 fL — ABNORMAL HIGH (ref 80.0–100.0)
MPV: 8 fL (ref 5.0–10.5)
Monocytes %: 6.6 %
Monocytes Absolute: 0.4 10*3/uL (ref 0.0–1.3)
Neutrophils %: 60.8 %
Neutrophils Absolute: 4 10*3/uL (ref 1.7–7.7)
Platelets: 329 10*3/uL (ref 135–450)
RBC: 3.35 M/uL — ABNORMAL LOW (ref 4.00–5.20)
RDW: 18 % — ABNORMAL HIGH (ref 12.4–15.4)
WBC: 6.6 10*3/uL (ref 4.0–11.0)

## 2018-01-22 NOTE — Progress Notes (Signed)
C-Difficile admission screening and protocol:     * Admitted with diarrhea?yes     *Prior history of C-Diff. In last 3 months?no     *Antibiotic use in the past 6-8 weeks?no     *Prior hospitalization or nursing home in the last month? yes         [place] time___1000 per pt_________        Surgery time__1130__________    Take the following medications with a sip of water:Follow your MD/Surgeons pre-procedure instructions regarding your medications    Do not eat or drink anything after 12:00 midnight prior to your surgery.  This includes water chewing gum, mints and ice chips.   You may brush your teeth and gargle the morning of your surgery, but do not swallow the water     Please see your family doctor/pediatrician for a history and physical and/or concerning medications.   Bring any test results/reports from your physicians office.   If you are under the care of a heart doctor or specialist doctor, please be aware that you may be asked to them for clearance    You may be asked to stop blood thinners such as Coumadin, Plavix, Fragmin, Lovenox, etc., or any anti-inflammatories such as:  Aspirin, Ibuprofen, Advil, Naproxen prior to your surgery.    We also ask that you stop any OTC medications such as fish oil, vitamin E, glucosamine, garlic, Multivitamins, COQ 10, etc.    We ask that you do not smoke 24 hours prior to surgery  We ask that you do not  drink any alcoholic beverages 24 hours prior to surgery     You must make arrangements for a responsible adult to take you home after your surgery.    For your safety you will not be allowed to leave alone or drive yourself home.  Your surgery will be cancelled if you do not have a ride home.     Also for your safety, it is strongly suggested that someone stay with you the first 24 hours after your surgery.     A parent or legal guardian must accompany a child scheduled for surgery and plan to stay at the hospital until  the child is discharged.    Please do not bring other children with you.    For your comfort, please wear simple loose fitting clothing to the hospital.  Please do not bring valuables.    Do not wear any make-up or nail polish on your fingers or toes      For your safety, please do not wear any jewelry or body piercing's on the day of surgery.   All jewelry must be removed.       If you have dentures, they will be removed before going to operating room.    For your convenience, we will provide you with a container.    If you wear contact lenses or glasses, they will be removed, please bring a case for them.     If you have a living will and a durable power of attorney for healthcare, please bring in a copy.     As part of our patient safety program to minimize surgical site infections, we ask you to do the following:    ?? Please notify your surgeon if you develop any illness between         now and the  day of your surgery.    ?? This  includes a cough, cold, fever, sore throat, nausea,         or vomiting, and diarrhea, etc.  ??  Please notify your surgeon if you experience dizziness, shortness         of breath or blurred vision between now and the time of your surgery.      Do not shave your operative site 96 hours prior to surgery.   For face and neck surgery, men may use an electric razor 48 hours   prior to surgery.    You may shower the night before surgery or the morning of   your surgery with an antibacterial soap.    You will need to bring a photo ID and insurance card    [facility] has an onsite pharmacy, would you like to utilize our pharmacy     If you will be staying overnight and use a C-pap machine, please bring   your C-pap to hospital     Our goal is to provide you with excellent care, therefore, visitors will be limited to two(2) in the room at a time so that we may focus on providing this care for you.          Please contact pre-admission testing if you have any further questions.                  [facility] phone number:  [facility] fax number:  [phone]  Please note these are generalized instructions for all surgical cases, you may be provided with more specific instructions according to your surgery.

## 2018-01-24 ENCOUNTER — Encounter: Attending: Surgery | Primary: Internal Medicine

## 2018-01-24 ENCOUNTER — Inpatient Hospital Stay
Admit: 2018-01-24 | Discharge: 2018-01-26 | Disposition: A | Discharge status: Home / Self Care | Payer: MEDICARE | Attending: Surgery | Admitting: Surgery

## 2018-01-24 DIAGNOSIS — K449 Diaphragmatic hernia without obstruction or gangrene: Secondary | ICD-10-CM

## 2018-01-24 MED ORDER — FENTANYL CITRATE (PF) 100 MCG/2ML IJ SOLN
100 MCG/2ML | INTRAMUSCULAR | Status: DC | PRN
Start: 2018-01-24 — End: 2018-01-24
  Administered 2018-01-24 (×2): 50 via INTRAVENOUS

## 2018-01-24 MED ORDER — DIVALPROEX SODIUM ER 500 MG PO TB24
500 MG | Freq: Every evening | ORAL | Status: DC
Start: 2018-01-24 — End: 2018-01-26
  Administered 2018-01-25 – 2018-01-26 (×2): 500 mg via ORAL

## 2018-01-24 MED ORDER — POTASSIUM CHLORIDE 10 MEQ/100ML IV SOLN
10 MEQ/0ML | INTRAVENOUS | Status: DC | PRN
Start: 2018-01-24 — End: 2018-01-26

## 2018-01-24 MED ORDER — KETAMINE HCL 30 MG/3ML IJ SOSY
30 | INTRAMUSCULAR | Status: AC
Start: 2018-01-24 — End: 2018-01-24

## 2018-01-24 MED ORDER — PROPOFOL 200 MG/20ML IV EMUL
200 MG/20ML | INTRAVENOUS | Status: DC | PRN
Start: 2018-01-24 — End: 2018-01-24
  Administered 2018-01-24: 17:00:00 100 via INTRAVENOUS

## 2018-01-24 MED ORDER — HYDROMORPHONE HCL 1 MG/ML IJ SOLN
1 | INTRAMUSCULAR | Status: AC
Start: 2018-01-24 — End: 2018-01-24

## 2018-01-24 MED ORDER — GLYCOPYRROLATE 0.2 MG/ML IJ SOLN
0.2 MG/ML | INTRAMUSCULAR | Status: DC | PRN
Start: 2018-01-24 — End: 2018-01-24
  Administered 2018-01-24: 16:00:00 0.2 via INTRAVENOUS

## 2018-01-24 MED ORDER — ROCURONIUM BROMIDE 50 MG/5ML IV SOLN
50 MG/5ML | INTRAVENOUS | Status: DC | PRN
Start: 2018-01-24 — End: 2018-01-24
  Administered 2018-01-24: 18:00:00 20 via INTRAVENOUS
  Administered 2018-01-24: 17:00:00 50 via INTRAVENOUS

## 2018-01-24 MED ORDER — NORMAL SALINE FLUSH 0.9 % IV SOLN
0.9 % | Freq: Two times a day (BID) | INTRAVENOUS | Status: DC
Start: 2018-01-24 — End: 2018-01-26
  Administered 2018-01-25 – 2018-01-26 (×3): 10 mL via INTRAVENOUS

## 2018-01-24 MED ORDER — HYDROMORPHONE HCL 1 MG/ML IJ SOLN
1 MG/ML | INTRAMUSCULAR | Status: DC | PRN
Start: 2018-01-24 — End: 2018-01-24
  Administered 2018-01-24 (×2): .25 via INTRAVENOUS

## 2018-01-24 MED ORDER — OXYCODONE HCL 5 MG PO TABS
5 MG | ORAL | Status: DC | PRN
Start: 2018-01-24 — End: 2018-01-26
  Administered 2018-01-25: 18:00:00 5 mg via ORAL

## 2018-01-24 MED ORDER — SUGAMMADEX SODIUM 200 MG/2ML IV SOLN
200 | INTRAVENOUS | Status: AC
Start: 2018-01-24 — End: 2018-01-24

## 2018-01-24 MED ORDER — NORMAL SALINE FLUSH 0.9 % IV SOLN
0.9 % | INTRAVENOUS | Status: DC | PRN
Start: 2018-01-24 — End: 2018-01-26

## 2018-01-24 MED ORDER — BUPIVACAINE LIPOSOME 1.3 % IJ SUSP
1.3 % | Freq: Once | INTRAMUSCULAR | Status: AC | PRN
Start: 2018-01-24 — End: 2018-01-24
  Administered 2018-01-24: 17:00:00 20 via SUBCUTANEOUS

## 2018-01-24 MED ORDER — DOCUSATE SODIUM 100 MG PO CAPS
100 MG | Freq: Two times a day (BID) | ORAL | Status: DC
Start: 2018-01-24 — End: 2018-01-26
  Administered 2018-01-25 – 2018-01-26 (×4): 100 mg via ORAL

## 2018-01-24 MED ORDER — DEXAMETHASONE SODIUM PHOSPHATE 20 MG/5ML IJ SOLN
20 | INTRAMUSCULAR | Status: AC
Start: 2018-01-24 — End: 2018-01-24

## 2018-01-24 MED ORDER — TRAZODONE HCL 50 MG PO TABS
50 MG | Freq: Every evening | ORAL | Status: DC | PRN
Start: 2018-01-24 — End: 2018-01-26
  Administered 2018-01-25 – 2018-01-26 (×2): 50 mg via ORAL

## 2018-01-24 MED ORDER — MAGNESIUM SULFATE IN D5W 1-5 GM/100ML-% IV SOLN
1-5 GM/100ML-% | INTRAVENOUS | Status: DC | PRN
Start: 2018-01-24 — End: 2018-01-26

## 2018-01-24 MED ORDER — PAROXETINE HCL 20 MG PO TABS
20 MG | Freq: Every evening | ORAL | Status: DC
Start: 2018-01-24 — End: 2018-01-26
  Administered 2018-01-25 – 2018-01-26 (×2): 40 mg via ORAL

## 2018-01-24 MED ORDER — HYDROMORPHONE HCL 1 MG/ML IJ SOLN
1 MG/ML | INTRAMUSCULAR | Status: DC | PRN
Start: 2018-01-24 — End: 2018-01-26
  Administered 2018-01-24: 21:00:00 1 mg via INTRAVENOUS

## 2018-01-24 MED ORDER — NORMAL SALINE FLUSH 0.9 % IV SOLN
0.9 % | INTRAVENOUS | Status: DC | PRN
Start: 2018-01-24 — End: 2018-01-24

## 2018-01-24 MED ORDER — OXYCODONE HCL 10 MG PO TABS
10 MG | ORAL | Status: DC | PRN
Start: 2018-01-24 — End: 2018-01-26
  Administered 2018-01-25 – 2018-01-26 (×8): 10 mg via ORAL

## 2018-01-24 MED ORDER — FAMOTIDINE 20 MG/2ML IV SOLN
20 | INTRAVENOUS | Status: AC
Start: 2018-01-24 — End: 2018-01-24

## 2018-01-24 MED ORDER — PANTOPRAZOLE SODIUM 40 MG PO TBEC
40 MG | Freq: Every day | ORAL | Status: DC
Start: 2018-01-24 — End: 2018-01-26
  Administered 2018-01-24 – 2018-01-26 (×3): 40 mg via ORAL

## 2018-01-24 MED ORDER — SODIUM CHLORIDE 0.9 % IV SOLN
0.9 % | INTRAVENOUS | Status: DC
Start: 2018-01-24 — End: 2018-01-24
  Administered 2018-01-24 (×3): via INTRAVENOUS

## 2018-01-24 MED ORDER — ONDANSETRON HCL 4 MG/2ML IJ SOLN
4 MG/2ML | Freq: Four times a day (QID) | INTRAMUSCULAR | Status: DC | PRN
Start: 2018-01-24 — End: 2018-01-26

## 2018-01-24 MED ORDER — NADOLOL 20 MG PO TABS
20 MG | Freq: Every evening | ORAL | Status: DC
Start: 2018-01-24 — End: 2018-01-26
  Administered 2018-01-25 – 2018-01-26 (×2): 10 mg via ORAL

## 2018-01-24 MED ORDER — LEVOTHYROXINE SODIUM 25 MCG PO TABS
25 MCG | Freq: Every day | ORAL | Status: DC
Start: 2018-01-24 — End: 2018-01-26
  Administered 2018-01-25 – 2018-01-26 (×2): 25 ug via ORAL

## 2018-01-24 MED ORDER — SUCCINYLCHOLINE CHLORIDE 20 MG/ML IJ SOLN
20 MG/ML | INTRAMUSCULAR | Status: DC | PRN
Start: 2018-01-24 — End: 2018-01-24
  Administered 2018-01-24: 17:00:00 120 via INTRAVENOUS

## 2018-01-24 MED ORDER — CEFAZOLIN 2000 MG IN D5W 100 ML IVPB
Status: AC
Start: 2018-01-24 — End: 2018-01-24
  Administered 2018-01-24: 17:00:00 2 g via INTRAVENOUS

## 2018-01-24 MED ORDER — LIDOCAINE HCL (PF) 2 % IJ SOLN
2 % | INTRAMUSCULAR | Status: DC | PRN
Start: 2018-01-24 — End: 2018-01-24
  Administered 2018-01-24: 17:00:00 60 via INTRAVENOUS

## 2018-01-24 MED ORDER — LACTATED RINGERS IV SOLN
INTRAVENOUS | Status: DC
Start: 2018-01-24 — End: 2018-01-26
  Administered 2018-01-24 – 2018-01-25 (×2): via INTRAVENOUS

## 2018-01-24 MED ORDER — ACETAMINOPHEN 325 MG PO TABS
325 MG | Freq: Four times a day (QID) | ORAL | Status: DC
Start: 2018-01-24 — End: 2018-01-26
  Administered 2018-01-24 – 2018-01-26 (×7): 650 mg via ORAL

## 2018-01-24 MED ORDER — DEXAMETHASONE SODIUM PHOSPHATE 20 MG/5ML IJ SOLN
20 MG/5ML | INTRAMUSCULAR | Status: DC | PRN
Start: 2018-01-24 — End: 2018-01-24
  Administered 2018-01-24: 17:00:00 10 via INTRAVENOUS

## 2018-01-24 MED ORDER — LIDOCAINE HCL (PF) 2 % IJ SOLN
2 | INTRAMUSCULAR | Status: AC
Start: 2018-01-24 — End: 2018-01-24

## 2018-01-24 MED ORDER — NORMAL SALINE FLUSH 0.9 % IV SOLN
0.9 % | Freq: Two times a day (BID) | INTRAVENOUS | Status: DC
Start: 2018-01-24 — End: 2018-01-24
  Administered 2018-01-24: 15:00:00 10 mL via INTRAVENOUS

## 2018-01-24 MED ORDER — KETAMINE HCL 30 MG/3ML IJ SOSY
30 MG/3ML | INTRAMUSCULAR | Status: DC | PRN
Start: 2018-01-24 — End: 2018-01-24
  Administered 2018-01-24: 17:00:00 30 via INTRAVENOUS

## 2018-01-24 MED ORDER — ENOXAPARIN SODIUM 40 MG/0.4ML SC SOLN
40 MG/0.4ML | Freq: Every day | SUBCUTANEOUS | Status: DC
Start: 2018-01-24 — End: 2018-01-26
  Administered 2018-01-25 – 2018-01-26 (×2): 40 mg via SUBCUTANEOUS

## 2018-01-24 MED ORDER — SODIUM CHLORIDE 0.9 % IR SOLN
0.9 % | Status: AC | PRN
Start: 2018-01-24 — End: 2018-01-24
  Administered 2018-01-24: 17:00:00 1000

## 2018-01-24 MED ORDER — SUGAMMADEX SODIUM 200 MG/2ML IV SOLN
200 MG/2ML | INTRAVENOUS | Status: DC | PRN
Start: 2018-01-24 — End: 2018-01-24
  Administered 2018-01-24: 19:00:00 118 via INTRAVENOUS

## 2018-01-24 MED ORDER — FENTANYL CITRATE (PF) 100 MCG/2ML IJ SOLN
100 | INTRAMUSCULAR | Status: AC
Start: 2018-01-24 — End: 2018-01-24

## 2018-01-24 MED ORDER — BUPIVACAINE LIPOSOME 1.3 % IJ SUSP
1.3 | INTRAMUSCULAR | Status: AC
Start: 2018-01-24 — End: 2018-01-24

## 2018-01-24 MED ORDER — HYDROMORPHONE 0.5MG/0.5ML IJ SOLN
1 MG/ML | Status: DC | PRN
Start: 2018-01-24 — End: 2018-01-26

## 2018-01-24 MED FILL — PANTOPRAZOLE SODIUM 40 MG PO TBEC: 40 mg | ORAL | Qty: 1

## 2018-01-24 MED FILL — XYLOCAINE-MPF 2 % IJ SOLN: 2 % | INTRAMUSCULAR | Qty: 5

## 2018-01-24 MED FILL — FENTANYL CITRATE (PF) 100 MCG/2ML IJ SOLN: 100 MCG/2ML | INTRAMUSCULAR | Qty: 2

## 2018-01-24 MED FILL — HYDROMORPHONE HCL 1 MG/ML IJ SOLN: 1 mg/mL | INTRAMUSCULAR | Qty: 1

## 2018-01-24 MED FILL — KETAMINE HCL 30 MG/3ML IJ SOSY: 30 MG/3ML | INTRAMUSCULAR | Qty: 3

## 2018-01-24 MED FILL — ACETAMINOPHEN 325 MG PO TABS: 325 mg | ORAL | Qty: 2

## 2018-01-24 MED FILL — EXPAREL 1.3 % IJ SUSP: 1.3 % | INTRAMUSCULAR | Qty: 20

## 2018-01-24 MED FILL — BRIDION 200 MG/2ML IV SOLN: 200 MG/2ML | INTRAVENOUS | Qty: 2

## 2018-01-24 MED FILL — DEXAMETHASONE SODIUM PHOSPHATE 20 MG/5ML IJ SOLN: 20 MG/5ML | INTRAMUSCULAR | Qty: 5

## 2018-01-24 MED FILL — NADOLOL 20 MG PO TABS: 20 mg | ORAL | Qty: 1

## 2018-01-24 MED FILL — FAMOTIDINE 20 MG/2ML IV SOLN: 20 MG/2ML | INTRAVENOUS | Qty: 2

## 2018-01-24 MED FILL — CEFAZOLIN 2000 MG IN D5W 100 ML IVPB: Qty: 2

## 2018-01-24 NOTE — Progress Notes (Signed)
Report called to Mardelle Matte, Charity fundraiser.  VSS. Ok to transfer to room 4125.

## 2018-01-24 NOTE — Anesthesia Post-Procedure Evaluation (Signed)
Department of Anesthesiology  Postprocedure Note    Patient: Linda [name]  MRN: [id number]  Birthdate: 1945/11/07  Date of evaluation: 01/24/2018  Time:  5:08 PM     Procedure Summary     Date:  01/24/18 Room / Location:  [facility] OR 06 / [facility] OR    Anesthesia Start:  1228 Anesthesia Stop:  0981    Procedure:  LAPAROSCOPIC PARAESOPHAGEAL HERNIA REPAIR WITH  NISSEN FUNDOPLICATION (N/A Abdomen) Diagnosis:       Hiatal hernia      (HIATEL HERNIA)    Surgeon:  Delton Coombes, MD Responsible Provider:  Antonieta Pert, MD    Anesthesia Type:  general ASA Status:  3          Anesthesia Type: general    Aldrete Phase I: Aldrete Score: 8    Aldrete Phase II:      Last vitals: Reviewed and per EMR flowsheets.       Anesthesia Post Evaluation    Patient location during evaluation: PACU  Level of consciousness: awake and alert  Airway patency: patent  Nausea & Vomiting: no nausea and no vomiting  Complications: no  Cardiovascular status: blood pressure returned to baseline  Respiratory status: acceptable  Hydration status: euvolemic  Comments: Postoperative Anesthesia Note    Name:    Linda [name]  MRN:      [id number]    Patient Vitals in the past 12 hrs:  01/24/18 1631, BP:(!) 148/67, Pulse:61, Resp:21, SpO2:100 %  01/24/18 1615, BP:(!) 155/66, Pulse:60, Resp:21, SpO2:99 %  01/24/18 1602, BP:(!) 146/62, Pulse:64, Resp:18, SpO2:99 %  01/24/18 1551, Pulse:62, Resp:18, SpO2:100 %  01/24/18 1546, BP:(!) 153/69, Pulse:61, Resp:23, SpO2:100 %  01/24/18 1542, BP:(!) 154/76, Pulse:59, Resp:22, SpO2:100 %  01/24/18 1537, BP:(!) 146/67, Pulse:61, Resp:21, SpO2:100 %  01/24/18 1526, BP:(!) 147/65, Pulse:60, Resp:20, SpO2:100 %  01/24/18 1517, BP:(!) 154/60, Temp:97.5 ??F (36.4 ??C), Temp OZH:YQMVHQIO, Pulse:67, Resp:15, SpO2:100 %  01/24/18 1019, BP:(!) 120/51, Temp:98.5 ??F (36.9 ??C), Pulse:(!) 43, Resp:14, SpO2:97 %  01/24/18 1010, Weight:130 lb 1.1 oz (59 kg)     LABS:    CBC  Lab Results       Component                 Value               Date/Time                  WBC                      6.6                 01/21/2018 08:27 PM        HGB                      11.5 (L)            01/21/2018 08:27 PM        HCT                      34.8 (L)            01/21/2018 08:27 PM        PLT                      329  01/21/2018 08:27 PM   RENAL  Lab Results       Component                Value               Date/Time                  NA                       137                 01/21/2018 08:27 PM        K                        4.0                 01/21/2018 08:27 PM        K                        3.7                 11/21/2017 01:07 AM        CL                       99                  01/21/2018 08:27 PM        CO2                      24                  01/21/2018 08:27 PM        BUN                      11                  01/21/2018 08:27 PM        CREATININE               0.6                 01/21/2018 08:27 PM        GLUCOSE                  102 (H)             01/21/2018 08:27 PM   COAGS  Lab Results       Component                Value               Date/Time                  PROTIME                  15.3 (H)            01/07/2018 09:39 AM        INR                      1.34 (H)            01/07/2018 09:39 AM        APTT  28.3                11/21/2017 01:07 AM     Intake & Output:  @24HRIO @    Nausea & Vomiting:  No    Level of Consciousness:  Awake    Pain Assessment:  Adequate analgesia    Anesthesia Complications:  No apparent anesthetic complications    SUMMARY      Vital signs stable  OK to discharge from Stage I post anesthesia care.  Care transferred from Anesthesiology department on discharge from perioperative area

## 2018-01-24 NOTE — Anesthesia Pre-Procedure Evaluation (Addendum)
[facility] Department of Anesthesiology  Pre-Anesthesia Evaluation/Consultation       Name:  Linda [name]  DOB: 1945/08/07  Age:  72 y.o.                                           MRN:  [id number]  Date: 01/24/2018           Surgeon: Surgeon(s):  Delton Coombes, MD  Janann August, MD    Procedure: Procedure(s):  LAPAROSCOPIC PARAESOPHAGEAL HERNIA REPAIR WITH  NISSEN FUNDOPLICATION, POSSIBLE OPEN     Allergies   Allergen Reactions   ??? Actonel [Risedronate Sodium]      Osteonecrosis jaw documented     Patient Active Problem List   Diagnosis   ??? Hyperlipidemia   ??? Senile osteoporosis   ??? Menopausal and postmenopausal disorder   ??? GERD (gastroesophageal reflux disease)   ??? Migraine headache   ??? Colon polyps   ??? Depression   ??? Intractable headache   ??? Hypoxemia   ??? Osteoporosis   ??? Intractable chronic migraine without aura   ??? Medication overuse headache   ??? Iron deficiency anemia secondary to blood loss (chronic)   ??? Other intestinal malabsorption   ??? Closed intertrochanteric fracture of hip, right, initial encounter ([place])   ??? Closed fracture of right wrist   ??? Closed fracture of right distal radius   ??? Closed fracture of intertrochanteric section of femur, right, sequela   ??? UGI bleed   ??? Acute pain of right knee   ??? Acute Lysbeth Galas ulcer     Past Medical History:   Diagnosis Date   ??? Bisphosphonate-associated osteonecrosis of the jaw [facility])    ??? Celiac artery atherosclerosis 2015    60% proximal stenosis   ??? Colon polyps     adenomatous    ??? Dyspepsia     nonulcerative   ??? Gastric ulcer    ??? Graves disease    ??? Hiatal hernia    ??? High cholesterol    ??? History of depression    ??? Hypertension     not a current issue   ??? Hypothyroidism due to Hashimoto's thyroiditis 2019   ??? 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 Migraine     recurrent, controlled with elavil   ??? Osteoarthritis of lumbar spine     [place]   ???  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 Postmenopausal    ??? Thoracic compression fracture (HCC)     T10 from a fall   ??? Wears partial dentures     upper 1 tooth on right     Past Surgical History:   Procedure Laterality Date   ??? ARTERY BIOPSY Right 12-24-12    Temporal Artery biopsy right    ??? BACK SURGERY      lumbar area   ??? COLONOSCOPY  [id number]    dr Awanda Mink   3 years   ??? COLONOSCOPY N/A 07/09/2017    COLONOSCOPY POLYPECTOMY SNARE performed by Orvan Seen, MD at [place]   ??? COLONOSCOPY N/A 07/09/2017    COLONOSCOPY WITH BIOPSY performed by Orvan Seen, MD at [place]   ??? ESOPHAGEAL MOTILITY STUDY N/A 08/27/2017    ESOPHAGEAL MANOMETRY performed by  Orvan Seen, MD at [place]   ??? FIXATION KYPHOPLASTY      t10   ??? HIP FRACTURE SURGERY Right 11/22/2017    RIGHT HIP GAMMA NAILING performed by Arville Lime, MD at [place]   ??? HIP SURGERY Right 11/22/2017    right hip gamma nailing   ??? ORIF DISTAL RADIUS FRACTURE Right 11/22/2017    OPEN REDUCTION INTERNAL FIXATION RIGHT RADIUS WITH C-ARM performed by Vevelyn Francois, MD at [place]   ??? SHOULDER SURGERY      left humerus fx 3 places   ??? UPPER GASTROINTESTINAL ENDOSCOPY  08/26/2014    dr Shanon Brow hess:negative   ??? UPPER GASTROINTESTINAL ENDOSCOPY N/A 08/08/2017    EGD ESOPHAGOGASTRODUODENOSCOPY performed by Orvan Seen, MD at [place]   ??? UPPER GASTROINTESTINAL ENDOSCOPY N/A 01/08/2018    EGD BIOPSY performed by Orvan Seen, MD at [place]   ??? WRIST FRACTURE SURGERY Right 11/22/2017     Social History     Tobacco Use   ??? Smoking status: Former Smoker     Packs/day: 0.00     Years: 40.00     Pack years: 0.00     Last attempt to quit: 08/06/1997     Years since quitting: 20.4   ??? Smokeless tobacco: Never Used   ??? Tobacco comment: rarely now smokes   Substance Use Topics   ??? Alcohol use: Yes     Alcohol/week: 0.0 oz     Comment: rarely, socially   ??? Drug use: No     Medications  No current facility-administered medications on file prior to  encounter.      Current Outpatient Medications on File Prior to Encounter   Medication Sig Dispense Refill   ??? nadolol (CORGARD) 20 MG tablet Take 10 mg by mouth nightly     ??? PARoxetine (PAXIL) 40 MG tablet Take 1 tablet by mouth daily (Patient taking differently: Take 40 mg by mouth nightly ) 90 tablet 3   ??? traZODone (DESYREL) 50 MG tablet TAKE 1 TABLET BY MOUTH EVERY NIGHT AS NEEDED FOR SLEEP 90 tablet 5   ??? pantoprazole (PROTONIX) 40 MG tablet Take 1 tablet by mouth every morning (before breakfast) 30 tablet 3   ??? divalproex (DEPAKOTE ER) 500 MG extended release tablet Take 1 tablet by mouth nightly 30 tablet 3   ??? Polysaccharide Iron Complex 391.3 (180 Fe) MG CAPS One a day for six months 30 capsule 5   ??? Cobalamine Combinations (B12 FOLATE) 800-800 MCG CAPS One a day indefinitely 30 capsule 3   ??? Calcium Carbonate-Vitamin D (CALTRATE 600+D) 600-400 MG-UNIT CHEW Take 1 tablet by mouth 2 times daily. 60 tablet 11     Current Facility-Administered Medications   Medication Dose Route Frequency Provider Last Rate Last Dose   ??? 0.9 % sodium chloride infusion   Intravenous Continuous Dolly Rias, MD 125 mL/hr at 01/24/18 1028     ??? sodium chloride flush 0.9 % injection 10 mL  10 mL Intravenous 2 times per day Dolly Rias, MD       ??? sodium chloride flush 0.9 % injection 10 mL  10 mL Intravenous PRN Dolly Rias, MD       ??? ceFAZolin (ANCEF) 2 g in dextrose 5 % 100 mL IVPB  2 g Intravenous On Call to [employer], MD         Vital Signs (Current)   Vitals:    01/22/18  1718 01/24/18 1010 01/24/18 1019   BP:   (!) 120/51   Pulse:   (!) 43   Resp:   14   Temp:   98.5 ??F (36.9 ??C)   SpO2:   97%   Weight: 131 lb (59.4 kg) 130 lb 1.1 oz (59 kg)    Height: 5\' 4"  (1.626 m)                                            BP Readings from Last 3 Encounters:   01/24/18 (!) 120/51   01/21/18 116/70   01/10/18 128/78     Vital Signs Statistics (for past 48 hrs)     Temp  Avg: 98.5 ??F (36.9 ??C)  Min: 98.5 ??F (36.9 ??C)    Min taken time: 01/24/18 1019  Max: 98.5 ??F (36.9 ??C)   Max taken time: 01/24/18 1019  Pulse  Avg: 43  Min: 25   Min taken time: 01/24/18 1019  Max: 48   Max taken time: 01/24/18 1019  Resp  Avg: 14  Min: 54   Min taken time: 01/24/18 1019  Max: 14   Max taken time: 01/24/18 1019  BP  Min: 120/51   Min taken time: 01/24/18 1019  Max: 120/51   Max taken time: 01/24/18 1019  SpO2  Avg: 97 %  Min: 97 %   Min taken time: 01/24/18 1019  Max: 97 %   Max taken time: 01/24/18 1019  BP Readings from Last 3 Encounters:   01/24/18 (!) 120/51   01/21/18 116/70   01/10/18 128/78       BMI  Body mass index is 22.33 kg/m??.  Estimated body mass index is 22.33 kg/m?? as calculated from the following:    Height as of this encounter: 5\' 4"  (1.626 m).    Weight as of this encounter: 130 lb 1.1 oz (59 kg).    CBC   Lab Results   Component Value Date    WBC 6.6 01/21/2018    RBC 3.35 01/21/2018    HGB 11.5 01/21/2018    HCT 34.8 01/21/2018    MCV 103.7 01/21/2018    RDW 18.0 01/21/2018    PLT 329 01/21/2018     CMP    Lab Results   Component Value Date    NA 137 01/21/2018    K 4.0 01/21/2018    K 3.7 11/21/2017    CL 99 01/21/2018    CO2 24 01/21/2018    BUN 11 01/21/2018    CREATININE 0.6 01/21/2018    GFRAA >60 01/21/2018    GFRAA >60 05/12/2010    AGRATIO 1.3 01/07/2018    LABGLOM >60 01/21/2018    GLUCOSE 102 01/21/2018    PROT 7.0 01/07/2018    PROT 7.8 05/12/2010    CALCIUM 9.6 01/21/2018    BILITOT 0.5 01/07/2018    ALKPHOS 206 01/07/2018    AST 14 01/07/2018    ALT 8 01/07/2018     BMP    Lab Results   Component Value Date    NA 137 01/21/2018    K 4.0 01/21/2018    K 3.7 11/21/2017    CL 99 01/21/2018    CO2 24 01/21/2018    BUN 11 01/21/2018    CREATININE 0.6 01/21/2018    CALCIUM 9.6 01/21/2018    GFRAA >60 01/21/2018  GFRAA >60 05/12/2010    LABGLOM >60 01/21/2018    GLUCOSE 102 01/21/2018     POCGlucose  Recent Labs     01/21/18  2027   GLUCOSE 102*      Coags    Lab Results   Component Value Date    PROTIME 15.3  01/07/2018    INR 1.34 01/07/2018    APTT 28.3 40/98/1191     HCG (If Applicable) No results found for: PREGTESTUR, PREGSERUM, HCG, HCGQUANT   ABGs   Lab Results   Component Value Date    PHART 7.409 12/23/2012    PO2ART 112.0 12/23/2012    PCO2ART 34.8 12/23/2012    HCO3ART 21.6 12/23/2012    BEART -2.0 12/23/2012    O2SATART 98.4 12/23/2012      Type & Screen (If Applicable)  No results found for: LABABO, LABRH                         BMI: Wt Readings from Last 3 Encounters:       NPO Status:   Date of last liquid consumption: 01/23/18   Time of last liquid consumption: 2100   Date of last solid food consumption: 01/23/18      Time of last solid consumption: 2100       Anesthesia Evaluation    Airway: Mallampati: II  TM distance: >3 FB   Neck ROM: full  Mouth opening: > = 3 FB Dental:          Pulmonary:                              Cardiovascular:    (+) hypertension:, hyperlipidemia                  Neuro/Psych:   (+) neuromuscular disease:, headaches: migraine headaches, psychiatric history:depression/anxiety             GI/Hepatic/Renal:   (+) hiatal hernia, GERD:, PUD,           Endo/Other:    (+) hypothyroidism, hyperthyroidism::., .                 Abdominal:           Vascular:                                      Anesthesia Plan      general     ASA 3     (I discussed with the patient the risks and benefits of PIV, general anesthesia, IV Narcotics, PACU.  All questions were answered the patient agrees with the plan.)  Induction: intravenous.      Anesthetic plan and risks discussed with patient.      Plan discussed with CRNA.                  This pre-anesthesia assessment may be used as a history and physical.    DOS STAFF ADDENDUM:    Pt seen and examined, chart reviewed (including anesthesia, drug and allergy history).  No interval changes to history and physical examination.  Anesthetic plan, risks, benefits, alternatives, and personnel involved discussed with patient.  Patient verbalized an  understanding and agrees to proceed.      Antonieta Pert, MD  January 24, 2018  11:04 AM

## 2018-01-24 NOTE — H&P (Signed)
H&P Update    Patient's History and Physical from Dr. Garen Grams on January 21, 2018 was reviewed.    Patient examined.    There has been no change.  Patient with hiatal hernia causing UGI bleeding.  Will proceed with laparoscopic hiatal hernia repair with Nissen fundoplication, possible open.  Ancef ordered.  Bilateral SCDs.    Delton Coombes, MD

## 2018-01-24 NOTE — Op Note (Signed)
[facility]           [place] [address]                                OPERATIVE REPORT    PATIENT NAME: Linda [name], Linda A                  DOB:        1945/09/01  MED REC NO:   [id number]                          ROOM:       3664  ACCOUNT NO:   [id number]                           ADMIT DATE: 01/24/2018  PROVIDER:     Janann August, MD      DATE OF PROCEDURE:  01/24/2018    PREOPERATIVE DIAGNOSES:  Hiatal hernia causing reflux and upper GI  bleeding from [facility] ulcers.    POSTOPERATIVE DIAGNOSES:  Hiatal hernia causing reflux and upper GI  bleeding from [facility] ulcers.    OPERATION PERFORMED:  Laparoscopic paraesophageal hernia repair with  Nissan fundoplication.    SURGEON:  [name], MD    CO-SURGEON:  Janann August, MD    ANESTHESIA:  General endotracheal anesthesia.    ASA CLASS:  III.    ANTIBIOTICS:  Ancef 2 gm IV.    DVT PROPHYLAXIS:  Bilateral pneumatic compression devices.    INDICATIONS:  The patient is [name] patient of Dr. Janan Halter with [name] known large  hiatal hernia causing reflux.  She was evaluated back in the early  winter and recommended repair by Dr. Janan Halter at that time.  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  As [name] result, she was scheduled  for repair.  Dr. Janan Halter asked I assist due to the large nature of the  hernia with nearly all of her stomach in it.  For full details, please  see his preoperative consultation report.    OPERATIVE PROCEDURE:  The patient was brought to the operating room and  placed in the supine position on the operating room table.  General  anesthesia was induced.  Her arms were tucked to her side and then  abdomen prepped and draped in usual sterile fashion.  [name] timeout  performed.    By the time, I got into the room, all the ports had been placed by Dr.  Janan Halter.  The liver retractor was placed as well.  The patient  at that  point was placed in reverse Trendelenburg position to better visualize  the defect.  The stomach was mainly in the hiatal hernia and was gently  retracted down into the abdomen.  The medial aspect of the stomach to  the pars flaccida began dissecting all the way up to the right crus  skeletonizing the right crus.    We then came across the anterior aspect of the hiatus, bringing up the  hernia sac completely and mobilizing this into the stomach.  We then  took down some short gastrics in order to further reduce the stomach and  expose the left crus.  Once we had both dissected out, we were able  to  posteriorly dissect below the gastroesophageal junction.  The esophagus  was identified throughout the course of our dissection and spared.  Once  this was mobilized, [name] Penrose drain was then placed to retract the  esophagus anteriorly.  Crus were then brought together using [name] stitch  with multiple 2-0 Surgidac nonabsorbable sutures to reapproximate the  crus.  This took four sutures separately.  It came together nicely  without tension and we had to pass the tip of the grasper through this  to make sure it was not too tight.    I then turned attention to fundoplication.  Penrose drain was removed  and the fundus was then wrapped posteriorly around the distal stomach  with [name] 360-degree wrap.  Three additional sutures were placed  reapproximating the stomach over top of the esophagus.  Our middle  suture was used to tack the wrap to the esophagus to prevent migration.   It was [name] loose wrap and was not too tight and the NG tube was removed at  that point.  No other pathology was seen within the abdomen.  Liver  retractor was removed.  No bleeding was encountered.    The 10-mm trocar was closed with Ranfac suture passer with an 0 Vicryl  and then the other trocars were closed with 4-0 Vicryl subcuticularly.   Skin Affix dermal adhesive was applied after injecting local anesthetic.  The patient tolerated the  procedure well.  She was extubated in the  operating room and taken to PACU in stable condition.  All counts were  correct at the end of the case.        Janann August, MD    D: 01/24/2018 15:48:31       T: 01/24/2018 16:19:08     [id number]  Job#: [id number]     Doc#: [id number]    CC:

## 2018-01-24 NOTE — Op Note (Signed)
Operative Report      Patient: Linda [name] MRN: [id number]     Date of Birth: [birth year]  Age: 72 y.o.  Sex: female        Primary Care Physician: Angelica Pou, MD         DATE OF OPERATION: 01/24/18    PREOPERATIVE DIAGNOSIS: hiatal hernia causing GERD and UGI bleeding from [facility] ulcers    POSTOPERATIVE DIAGNOSIS: same    PROCEDURE PERFORMED:   1.  Laparoscopic paraesophageal hernia repair.    2.  Laparoscopic Nissen fundoplication.      SURGEON: Delton Coombes, MD  CO-SURGEON: Patrica Duel, MD    ANESTHESIA: General endotracheal    ASA CLASS: 3    DVT PROPHYLAXIS: bilateral SCDs     ANTIBIOTICS:  The patient was given Ancef 2 g IV.      INDICATIONS:  The patient is a had a known large hiatal hernia causing GERD.  Most recently she had UGI bleeding.  Upper endoscopy revealed Cameron ulcers.  Due to her complications from her hiatal hernia, the patient was brought to  the operating room after the risks, benefits and alternatives were discussed  with her and she was agreeable to proceed.       PROCEDURE:  The patient was brought to the operating room and placed in the  supine position.  General anesthesia was induced.  The patient was then  prepped and draped in the usual sterile fashion.  A time-out was performed.   A supraumbilical incision was made and a Veress needle was inserted in order  to insufflate the abdomen with carbon dioxide gas to a pressure of 15 mmHg.   Once insufflated, a 5-mm trocar was then placed followed by a camera.  This  facilitated placement of 2 additional ports in the left upper quadrant which  were both 5 mm.  We placed exparel at the time of port insertion.  One 10-mm port in the right midcostal line and then 2  additional 5-mm ports were placed in the right upper quadrant.  Through an epigastric port site, the liver retractor was then placed to  expose the hiatal hernia.  The patient was placed in reversed Trendelenburg  in order to better visualize the defect.  The stomach  was mainly within the  hiatal hernia.  With gentle traction it was taken down back into the abdomen.   We first focused our dissection in the medial aspect of the stomach going  through the pars flaccida all the way up to the right crus.  We then came across the anterior aspect of the hiatus, freeing the hernia sac and fully reducing the stomach. We then started taking down the short gastrics  in order to further reduce the stomach and expose the left crus.  We were able to connect our posterior dissection to the other side.  Once the stomach  was well mobilized and down into the abdomen, we were able to identify the  esophagus and make sure that the distal aspect was intraabdominal.  A Penrose  drain was then placed around the stomach in order to bring it further into  the abdomen.  We then  reapproximated the left and right crura using an endostitch using non-absorbable sutures.  Multiple  interrupted 2-0 surgidac sutures were used to reapproximate the crura.  This  came together under no tension and allowed some laxity around the esophagus  to expand with food bolus.  As there was no tension  between the crura, we  decided not to use any mesh posteriorly.  There was a small serosal injury to the stomach secondary to manipulation of the large hiatal hernia, which was not unexpected.  This was repaired with a figure of eight 2-0 surgidac suture.  We then focused on a Nissen  fundoplication to reduce the chance of reflux.  The penrose drain was removed.  This was first performed when  the fundus was to be wrapped posteriorly around the distal esophagus with a  shoeshine maneuver.  We then placed sutures between the stomach and the  distal esophagus and back through the stomach in order to secure the wrap.   Two additional sutures were then placed after the first suture in order to  complete our wrap.  The wrap itself was loose and we did not think that it  would be too tight around the esophagus.  We secured the wrap  to the right crus with a single surgidac suture.  At this point the OG tube was removed.   No other pathology was seen within the  abdomen and there did not appear to be any bleeding from our repair.  The  right upper quadrant 10-mm trocar was then removed and the fascia was closed  using a Ranfac suture passer using an 0 Vicryl suture.  The remaining trocars  were then removed.  The skin was then closed using 4-0 Vicryl sutures.  Skin affix was placed over the incisions.  The patient overall  tolerated the procedure well and was taken to the PACU in stable condition.        Findings: large hiatal hernia containing stomach    Estimated Blood Loss: less than 50 ml    Complications: none           Specimen: none                  Electronically signed by Delton Coombes, MD on 01/24/2018 at 3:03 PM

## 2018-01-24 NOTE — Progress Notes (Signed)
Pt arrived to PACU from OR. Pt sleeping on 4l/nc with oral airway in place.  Abd soft.  Abd incisions x6 OTA with surgical glue noted. VSS

## 2018-01-24 NOTE — Progress Notes (Signed)
Pt awake. Oral airway d/c'd.

## 2018-01-25 LAB — CBC WITH AUTO DIFFERENTIAL
Basophils %: 0.1 %
Basophils Absolute: 0 10*3/uL (ref 0.0–0.2)
Eosinophils %: 0.4 %
Eosinophils Absolute: 0 10*3/uL (ref 0.0–0.6)
Hematocrit: 29 % — ABNORMAL LOW (ref 36.0–48.0)
Hemoglobin: 9.4 g/dL — ABNORMAL LOW (ref 12.0–16.0)
Lymphocytes %: 27.8 %
Lymphocytes Absolute: 2.1 10*3/uL (ref 1.0–5.1)
MCH: 33.7 pg (ref 26.0–34.0)
MCHC: 32.4 g/dL (ref 31.0–36.0)
MCV: 103.9 fL — ABNORMAL HIGH (ref 80.0–100.0)
MPV: 7.5 fL (ref 5.0–10.5)
Monocytes %: 5.3 %
Monocytes Absolute: 0.4 10*3/uL (ref 0.0–1.3)
Neutrophils %: 66.4 %
Neutrophils Absolute: 4.9 10*3/uL (ref 1.7–7.7)
Platelets: 230 10*3/uL (ref 135–450)
RBC: 2.79 M/uL — ABNORMAL LOW (ref 4.00–5.20)
RDW: 17.7 % — ABNORMAL HIGH (ref 12.4–15.4)
WBC: 7.4 10*3/uL (ref 4.0–11.0)

## 2018-01-25 LAB — BASIC METABOLIC PANEL
Anion Gap: 8 (ref 3–16)
BUN: 7 mg/dL (ref 7–20)
CO2: 30 mmol/L (ref 21–32)
Calcium: 8.9 mg/dL (ref 8.3–10.6)
Chloride: 100 mmol/L (ref 99–110)
Creatinine: <0.5 mg/dL — ABNORMAL LOW (ref 0.6–1.2)
GFR African American: >60 (ref >60)
GFR Non-African American: >60 (ref >60)
Glucose: 88 mg/dL (ref 70–99)
Potassium: 4.3 mmol/L (ref 3.5–5.1)
Sodium: 138 mmol/L (ref 136–145)

## 2018-01-25 LAB — MAGNESIUM: Magnesium: 2 mg/dL (ref 1.80–2.40)

## 2018-01-25 LAB — PHOSPHORUS: Phosphorus: 1.8 mg/dL — ABNORMAL LOW (ref 2.5–4.9)

## 2018-01-25 MED FILL — ACETAMINOPHEN 325 MG PO TABS: 325 mg | ORAL | Qty: 2

## 2018-01-25 MED FILL — DOK 100 MG PO CAPS: 100 mg | ORAL | Qty: 1

## 2018-01-25 MED FILL — OXYCODONE HCL 10 MG PO TABS: 10 mg | ORAL | Qty: 1

## 2018-01-25 MED FILL — LEVOTHYROXINE SODIUM 25 MCG PO TABS: 25 ug | ORAL | Qty: 1

## 2018-01-25 MED FILL — PAROXETINE HCL 20 MG PO TABS: 20 mg | ORAL | Qty: 2

## 2018-01-25 MED FILL — LOVENOX 40 MG/0.4ML SC SOLN: 40 MG/0.4ML | SUBCUTANEOUS | Qty: 0.4

## 2018-01-25 MED FILL — PANTOPRAZOLE SODIUM 40 MG PO TBEC: 40 mg | ORAL | Qty: 1

## 2018-01-25 MED FILL — NADOLOL 20 MG PO TABS: 20 mg | ORAL | Qty: 0.5

## 2018-01-25 MED FILL — OXYCODONE HCL 5 MG PO TABS: 5 mg | ORAL | Qty: 1

## 2018-01-25 MED FILL — BRIDION 200 MG/2ML IV SOLN: 200 MG/2ML | INTRAVENOUS | Qty: 0.82

## 2018-01-25 MED FILL — TRAZODONE HCL 50 MG PO TABS: 50 mg | ORAL | Qty: 1

## 2018-01-25 MED FILL — DIVALPROEX SODIUM ER 500 MG PO TB24: 500 mg | ORAL | Qty: 1

## 2018-01-25 NOTE — Plan of Care (Signed)
Surgical sites intact with no drainage and open to air.    Daily care needs met with assist from staff.    Pt able to express presence/absence of pain and rate pain appropriately using numerical scale. Pain/discomfort being managed with PRN analgesics per MD orders (see MAR). Pain assessed every shift and after interventions.    Discharge planning remains ongoing.    Pt free from falls this shift. Fall precautions in place at all times. Call light always within reach. Pt able and agreeable to contact for safety appropriately.

## 2018-01-25 NOTE — Progress Notes (Signed)
General Surgery  Daily Progress Note    Pt Name: Linda [name]  Medical Record Number: [id number]  Date of Birth [birth year]   Today's Date: 01/25/2018    ASSESSMENT/PLAN  1. Hiatal hernia with Lysbeth Galas ulcers s/p lap hiatal hernia repair with Nissen fundoplication POD #1 - doing well.  Clears today, full liquids this evening.  D/c foley.  Ambulate/OOB.  Hopefully home as early as tomorrow.       SUBJECTIVE  Vernette has improved from yesterday. Pain is well controlled. She has no nausea and no vomiting. Current activity is ad lib    OBJECTIVE  VITALS:  height is 5\' 4"  (1.626 m) and weight is 150 lb 2.1 oz (68.1 kg). Her oral temperature is 97.9 ??F (36.6 ??C). Her blood pressure is 113/75 and her pulse is 45 (abnormal). Her respiration is 16 and oxygen saturation is 90%.   GENERAL: alert, no distress  LUNGS: normal respiratory effort, no accessory muscle use  HEART: normal rate and regular rhythm  ABDOMEN: soft, appropriately tender, ND, incisions c/d/i  EXTREMITY: no cyanosis and no clubbing  I/O last 3 completed shifts:  In: 3086 [P.O.:120; I.V.:1270]  Out: 1275 [Urine:1275]  No intake/output data recorded.    LABS  Recent Labs     01/25/18  0958   WBC 7.4   HGB 9.4*   HCT 29.0*   PLT 230   NA 138   K 4.3   CL 100   CO2 30   BUN 7   CREATININE <0.5*   MG 2.00   PHOS 1.8*   CALCIUM 8.9     CBC with Differential:    Lab Results   Component Value Date    WBC 7.4 01/25/2018    RBC 2.79 01/25/2018    HGB 9.4 01/25/2018    HCT 29.0 01/25/2018    PLT 230 01/25/2018    MCV 103.9 01/25/2018    MCH 33.7 01/25/2018    MCHC 32.4 01/25/2018    RDW 17.7 01/25/2018    SEGSPCT 41.6 05/12/2010    LYMPHOPCT 27.8 01/25/2018    MONOPCT 5.3 01/25/2018    EOSPCT 3.5 05/12/2010    BASOPCT 0.1 01/25/2018    MONOSABS 0.4 01/25/2018    LYMPHSABS 2.1 01/25/2018    EOSABS 0.0 01/25/2018    BASOSABS 0.0 01/25/2018    DIFFTYPE Auto-K 05/12/2010     BMP:    Lab Results   Component Value Date    NA 138 01/25/2018    K 4.3 01/25/2018    K  3.7 11/21/2017    CL 100 01/25/2018    CO2 30 01/25/2018    BUN 7 01/25/2018    LABALBU 3.9 01/07/2018    CREATININE <0.5 01/25/2018    CALCIUM 8.9 01/25/2018    GFRAA >60 01/25/2018    GFRAA >60 05/12/2010    LABGLOM >60 01/25/2018    GLUCOSE 88 01/25/2018         Delton Coombes, MD  Electronically signed 01/25/2018 at 3:21 PM

## 2018-01-25 NOTE — Progress Notes (Signed)
PCA reported HR of 45 that returns to 52 BPM quickly. Dr. Danella Deis notified via phone. See new BP medication parameters placed per MD.

## 2018-01-25 NOTE — Plan of Care (Signed)
Problem: Infection - Surgical Site:  Goal: Will show no infection signs and symptoms  Description  Will show no infection signs and symptoms  Outcome: Ongoing     Problem: Daily Care:  Goal: Daily care needs are met  Description  Daily care needs are met  Outcome: Ongoing     Problem: Pain:  Goal: Patient's pain/discomfort is manageable  Description  Patient's pain/discomfort is manageable  Outcome: Ongoing  Goal: Pain level will decrease  Description  Pain level will decrease  Outcome: Ongoing  Goal: Control of acute pain  Description  Control of acute pain  Outcome: Ongoing  Goal: Control of chronic pain  Description  Control of chronic pain  Outcome: Ongoing     Problem: Discharge Planning:  Goal: Patients continuum of care needs are met  Description  Patients continuum of care needs are met  Outcome: Ongoing    Pain control is being established. Patient remains free from falls

## 2018-01-25 NOTE — Progress Notes (Signed)
Patient is alert and oriented x 4. Respirations easy and even. Denies pain. No complaints. Tolerates jello well. VS stable. See am assessment. Call light in reach.

## 2018-01-25 NOTE — Progress Notes (Signed)
Progress Note  01/25/2018 9:23 AM  Subjective:   Admit Date: 01/24/2018  PCP: Angelica Pou, MD  Code:Full Code    Interval History: Overnight pt w/o significant events. Pt seen and examined, resting in bed in no acute distress. Pt states they are feeling better since admission.     Diet: DIET CLEAR LIQUID;    Data:   Scheduled Meds:  ??? divalproex  500 mg Oral Nightly   ??? levothyroxine  25 mcg Oral Daily   ??? nadolol  10 mg Oral Nightly   ??? pantoprazole  40 mg Oral QAM AC   ??? PARoxetine  40 mg Oral Nightly   ??? sodium chloride flush  10 mL Intravenous 2 times per day   ??? enoxaparin  40 mg Subcutaneous Daily   ??? acetaminophen  650 mg Oral Q6H   ??? docusate sodium  100 mg Oral BID       Continuous Infusions:  ??? lactated ringers 125 mL/hr at 01/24/18 1708       PRN Meds:traZODone, sodium chloride flush, ondansetron, potassium chloride, magnesium sulfate, HYDROmorphone **OR** HYDROmorphone, oxyCODONE **OR** oxyCODONE  I/O last 3 completed shifts:  In: 1700 [I.V.:1700]  Out: 825 [Urine:775; Blood:50]  I/O this shift:  In: -   Out: 150 [Urine:150]    Intake/Output Summary (Last 24 hours) at 01/25/2018 0923  Last data filed at 01/25/2018 0836  Gross per 24 hour   Intake 1700 ml   Output 975 ml   Net 725 ml       CBC: No results for input(s): WBC, HGB, PLT in the last 72 hours.  BMP:  No results for input(s): NA, K, CL, CO2, BUN, CREATININE, GLUCOSE in the last 72 hours.  Hepatic: No results for input(s): AST, ALT, ALB, BILITOT, ALKPHOS in the last 72 hours.  Troponin: No results for input(s): TROPONINI in the last 72 hours.  BNP: No results for input(s): BNP in the last 72 hours.  Lipids: No results for input(s): CHOL, HDL in the last 72 hours.    Invalid input(s): LDLCALCU  ABGs: Lab Results   Component Value Date    PHART 7.409 12/23/2012    PO2ART 112.0 12/23/2012    PCO2ART 34.8 12/23/2012     INR: No results for input(s): INR in the last 72 hours.    Objective:   Vitals: BP (!) 118/55    Pulse 50    Temp 98.5 ??F (36.9 ??C)  (Oral)    Resp 14    Ht 5\' 4"  (1.626 m)    Wt 150 lb 2.1 oz (68.1 kg)    SpO2 91%    BMI 25.77 kg/m??     General appearance: alert, appears stated age and cooperative  Lungs: clear to auscultation bilaterally  Heart: regular rate and rhythm  Abdomen: surgical incision site C/D/I. slight tenderness to palpation  Extremities: extremities normal, atraumatic, no cyanosis or edema  Neurologic: Mental status: Alert, oriented, thought content appropriate    Assessment/Plan:     Paraesophageal hernia repair with Nissen fundoplication:  - Post op day 1  - pain management.     Cameron ulcer, acute ()    Plan: - General surgery primary  - s/p nissen  - Protonix     GERD (gastroesophageal reflux disease)     Plan: - protonix     Hiatal hernia with GERD ()    Plan: S/p Nissen      Migraine headache ()    Assessment: recurrent  Plan: - Pt advised to f/u with PCP for evaluation of Botox treatment     Hyperlipidemia ()    Assessment: stable    Plan: - Stable; continue current management.      Depression (07/25/2010)    Assessment: stable    Plan: - Stable; continue current management.      Essential hypertension (01/25/2018)    Assessment: - stable    Plan: - Stable; continue current management.     F/E/N: clear liquid  PPx: Protonix   Dispo: med/surg    Nena Jordan, MD  9:23 AM    Documentation was done using voice recognition dragon software.  Every effort was made to ensure accuracy; however, inadvertent unintentional computerized transcription errors may be present.

## 2018-01-25 NOTE — Plan of Care (Signed)
Problem: Infection - Surgical Site:  Goal: Will show no infection signs and symptoms  Description  Will show no infection signs and symptoms  01/25/2018 1058 by Smitty Pluck, RN  Outcome: Ongoing   x 5 lap incisions intact with surgical glue. No drainage. No redness or swelling noted. Will monitor.   Problem: Pain:  Goal: Patient's pain/discomfort is manageable  Description  Patient's pain/discomfort is manageable  01/25/2018 1058 by Smitty Pluck, RN  Outcome: Ongoing     Problem: Falls - Risk of:  Goal: Will remain free from falls  Description  Will remain free from falls  Outcome: Ongoing  Call light in reach of patient at all times. Non-skid footwear intact. Room free from clutter. Foley monitored for output. Calls appropriately for care needs.

## 2018-01-26 MED ORDER — DSS 100 MG PO CAPS
100 MG | ORAL_CAPSULE | Freq: Two times a day (BID) | ORAL | 0 refills | Status: AC
Start: 2018-01-26 — End: ?

## 2018-01-26 MED ORDER — OXYCODONE HCL 5 MG PO TABS
5 MG | ORAL_TABLET | Freq: Four times a day (QID) | ORAL | 0 refills | Status: AC | PRN
Start: 2018-01-26 — End: 2018-01-29

## 2018-01-26 MED FILL — OXYCODONE HCL 10 MG PO TABS: 10 mg | ORAL | Qty: 1

## 2018-01-26 MED FILL — DIVALPROEX SODIUM ER 500 MG PO TB24: 500 mg | ORAL | Qty: 1

## 2018-01-26 MED FILL — PANTOPRAZOLE SODIUM 40 MG PO TBEC: 40 mg | ORAL | Qty: 1

## 2018-01-26 MED FILL — LEVOTHYROXINE SODIUM 25 MCG PO TABS: 25 ug | ORAL | Qty: 1

## 2018-01-26 MED FILL — DOK 100 MG PO CAPS: 100 mg | ORAL | Qty: 1

## 2018-01-26 MED FILL — TRAZODONE HCL 50 MG PO TABS: 50 mg | ORAL | Qty: 1

## 2018-01-26 MED FILL — NADOLOL 20 MG PO TABS: 20 mg | ORAL | Qty: 1

## 2018-01-26 MED FILL — PAROXETINE HCL 20 MG PO TABS: 20 mg | ORAL | Qty: 2

## 2018-01-26 MED FILL — LOVENOX 40 MG/0.4ML SC SOLN: 40 MG/0.4ML | SUBCUTANEOUS | Qty: 0.4

## 2018-01-26 MED FILL — ACETAMINOPHEN 325 MG PO TABS: 325 mg | ORAL | Qty: 2

## 2018-01-26 NOTE — Discharge Instructions (Signed)
Obie Dredge, MD     General and Vascular Surgery   Janann August, MD     [place], [place]     [address] [place] [address]  Patriciaann Clan, MD    Phone: [phone]    Theodora Blow, MD     Fax: [phone]  [name], MD                                                           POST-OPERATIVE INSTRUCTIONS FOR [place]    ? Call the office to schedule your post-operative appointment with your surgeon for two (2) weeks.     ? You will have water occlusive glue closing your incisions.    ? You may shower.  Wash incisions gently, and pat them dry. Do not rub your incisions.  Do not soak incisions in tub baths, hot tubs or pools    ? General guidelines for activity:  o Avoid strenuous activity or lifting anything heavier than 15 pounds for 4-6 weeks.   o It is OK to be up walking around; walking up and down stairs is also OK.   o Do what is comfortable: stop and rest when you feel tired.     ? Continue full liquids for 2 weeks after surgery.     ? Do NOT drive for one week and while taking your narcotic pain medicine.     ? Watch for signs of infection:   o Fever over 100.5   o Excessive warmth or bright redness around your incisions  o Leakage of bloody or cloudy fluid from you incisions    ? You will have pain medicine ordered. Take as directed.    ? If you experience constipation  o Increase your water intake, and activity; walking is best.  o A stool softener or mild laxative may be necessary if you still have not had a bowel  movement ; call the office for further instructions.

## 2018-01-26 NOTE — Progress Notes (Signed)
Progress Note  01/26/2018 9:37 AM  Subjective:   Admit Date: 01/24/2018  PCP: Angelica Pou, MD  Code:Full Code    Interval History: Overnight pt w/o significant events. Pt seen and examined, resting in bed in no acute distress. Pt states they are feeling better since admission.     Diet: DIET FULL LIQUID;    Data:   Scheduled Meds:  ??? divalproex  500 mg Oral Nightly   ??? levothyroxine  25 mcg Oral Daily   ??? nadolol  10 mg Oral Nightly   ??? pantoprazole  40 mg Oral QAM AC   ??? PARoxetine  40 mg Oral Nightly   ??? sodium chloride flush  10 mL Intravenous 2 times per day   ??? enoxaparin  40 mg Subcutaneous Daily   ??? acetaminophen  650 mg Oral Q6H   ??? docusate sodium  100 mg Oral BID       Continuous Infusions:  ??? lactated ringers 50 mL/hr at 01/25/18 1713       PRN Meds:traZODone, sodium chloride flush, ondansetron, potassium chloride, magnesium sulfate, HYDROmorphone **OR** HYDROmorphone, oxyCODONE **OR** oxyCODONE  I/O last 3 completed shifts:  In: 3976 [P.O.:480; I.V.:1350]  Out: 1750 [Urine:1750]  No intake/output data recorded.    Intake/Output Summary (Last 24 hours) at 01/26/2018 0937  Last data filed at 01/26/2018 0555  Gross per 24 hour   Intake 1830 ml   Output 1600 ml   Net 230 ml       CBC:   Recent Labs     01/25/18  0958   WBC 7.4   HGB 9.4*   PLT 230     BMP:    Recent Labs     01/25/18  0958   NA 138   K 4.3   CL 100   CO2 30   BUN 7   CREATININE <0.5*   GLUCOSE 88     Hepatic: No results for input(s): AST, ALT, ALB, BILITOT, ALKPHOS in the last 72 hours.  Troponin: No results for input(s): TROPONINI in the last 72 hours.  BNP: No results for input(s): BNP in the last 72 hours.  Lipids: No results for input(s): CHOL, HDL in the last 72 hours.    Invalid input(s): LDLCALCU  ABGs:   Lab Results   Component Value Date    PHART 7.409 12/23/2012    PO2ART 112.0 12/23/2012    PCO2ART 34.8 12/23/2012     INR: No results for input(s): INR in the last 72 hours.    Objective:   Vitals: BP 134/68    Pulse 50    Temp 97.5 ??F  (36.4 ??C) (Oral)    Resp 18    Ht 5\' 4"  (1.626 m)    Wt 147 lb 11.3 oz (67 kg)    SpO2 93%    BMI 25.35 kg/m??     General appearance: alert, appears stated age and cooperative  Lungs: clear to auscultation bilaterally  Heart: regular rate and rhythm  Abdomen: surgical incision site C/D/I. slight tenderness to palpation  Extremities: extremities normal, atraumatic, no cyanosis or edema  Neurologic: Mental status: Alert, oriented, thought content appropriate    Assessment/Plan:     Paraesophageal hernia repair with Nissen fundoplication:  - Post op day 3  - pain well controlled.     Cameron ulcer, acute    Plan: - General surgery primary  - s/p nissen  - Protonix     GERD (gastroesophageal reflux disease)  Plan: - protonix     Hiatal hernia with GERD ()    Plan: S/p Nissen      Migraine headache ()    Assessment: recurrent    Plan: - Pt advised to f/u with PCP for evaluation of Botox treatment     Hyperlipidemia ()    Assessment: stable    Plan: - Stable; continue current management.      Depression (07/25/2010)    Assessment: stable    Plan: - Stable; continue current management.      Essential hypertension (01/25/2018)    Assessment: - stable    Plan: - Stable; continue current management.     F/E/N: full liquid  PPx: Protonix   Dispo: discharge home today    Nena Jordan, MD  9:37 AM    Documentation was done using voice recognition dragon software.  Every effort was made to ensure accuracy; however, inadvertent unintentional computerized transcription errors may be present.

## 2018-01-26 NOTE — Discharge Summary (Signed)
General Surgery Discharge Summary        Linda [name]   DOB: Mar 13, 1946 MRN: [id number]  Date of Admission: 01/24/2018  Date of Discharge:   Admitting Physician:Nasif Bos Claude Manges, MD  Primary Care Physician: Angelica Pou, MD  Summary by: Tyson Alias Zyara Riling    Diagnosis Present on Admission:  ??? Cameron ulcer, acute  ??? Hiatal hernia with GERD  ??? Paraesophageal hiatal hernia  ??? GERD (gastroesophageal reflux disease)  ??? Depression  ??? Hyperlipidemia  ??? Migraine headache  ??? Essential hypertension      Secondary diagnosis:   Patient Active Problem List   Diagnosis   ??? Hyperlipidemia   ??? Senile osteoporosis   ??? Menopausal and postmenopausal disorder   ??? GERD (gastroesophageal reflux disease)   ??? Migraine headache   ??? Colon polyps   ??? Depression   ??? Intractable headache   ??? Hypoxemia   ??? Osteoporosis   ??? Intractable chronic migraine without aura   ??? Medication overuse headache   ??? Iron deficiency anemia secondary to blood loss (chronic)   ??? Other intestinal malabsorption   ??? Closed intertrochanteric fracture of hip, right, initial encounter ([place])   ??? Closed fracture of right wrist   ??? Closed fracture of right distal radius   ??? Closed fracture of intertrochanteric section of femur, right, sequela   ??? UGI bleed   ??? Acute pain of right knee   ??? Cameron ulcer, acute   ??? Hiatal hernia with GERD   ??? Paraesophageal hiatal hernia   ??? Essential hypertension       Consults: IM    Procedures: Procedure(s):  LAPAROSCOPIC PARAESOPHAGEAL HERNIA REPAIR WITH  NISSEN FUNDOPLICATION    Pathology: N/A    Summary of hospital stay:   Linda [name] is a 72 y.o. female admitted after laparoscopic hiatal hernia repair with Nissen fundoplication.  Her diet was slowly advanced and by POD #2 she was tolerating full liquids.  She was discharged home in stable condition.    Discharge Medications:  Current Discharge Medication List      CONTINUE these medications which have NOT CHANGED    Details   levothyroxine (SYNTHROID) 25 MCG  tablet TAKE 1 TABLET BY MOUTH EVERY DAY ON AN EMPTY STOMACH  Qty: 30 tablet, Refills: 1    Comments: **Patient requests 90 days supply**      nadolol (CORGARD) 20 MG tablet Take 10 mg by mouth nightly      PARoxetine (PAXIL) 40 MG tablet Take 1 tablet by mouth daily  Qty: 90 tablet, Refills: 3      traZODone (DESYREL) 50 MG tablet TAKE 1 TABLET BY MOUTH EVERY NIGHT AS NEEDED FOR SLEEP  Qty: 90 tablet, Refills: 5    Comments: **Patient requests 90 days supply**      pantoprazole (PROTONIX) 40 MG tablet Take 1 tablet by mouth every morning (before breakfast)  Qty: 30 tablet, Refills: 3      divalproex (DEPAKOTE ER) 500 MG extended release tablet Take 1 tablet by mouth nightly  Qty: 30 tablet, Refills: 3      Polysaccharide Iron Complex 391.3 (180 Fe) MG CAPS One a day for six months  Qty: 30 capsule, Refills: 5      Cobalamine Combinations (B12 FOLATE) 800-800 MCG CAPS One a day indefinitely  Qty: 30 capsule, Refills: 3      Calcium Carbonate-Vitamin D (CALTRATE 600+D) 600-400 MG-UNIT CHEW Take 1 tablet by mouth 2 times daily.  Qty: 60 tablet, Refills:  11             Discharged to:  home    Instruction:  The patient is instructed to return to the hospital or call the office for fevers > 101.30F, inability to tolerate a diet, or unexpected pain.  Surgery specific discharge instruction sheet was provided to the patient.  Diet: full liquids x 2 weeks   Activity: no driving x 1 week, no lifting greater than 15 lbs x 6 weeks    Follow up:  Dr. Janan Halter in 2 weeks    Electronically signed by Delton Coombes, MD on 01/26/2018 at 11:41 AM

## 2018-01-26 NOTE — Progress Notes (Signed)
Pt ok for discharge per MD. Discharge instructions and two script given, percocet and colace. IV removed.  No questions or concerns at this time. Transported by wheelchair to personal car with all personal belongings.

## 2018-01-27 NOTE — Care Coordination-Inpatient (Signed)
[place] Transitions Initial Follow Up Call    Call within 2 business days of discharge: Yes    Patient: Linda [name] Patient DOB: 1946/05/16   MRN: [id number]  Reason for Admission: Hiatal Hernia with GERD  Discharge Date: 01/26/18 RARS: Readmission Risk Score: 16      Last Discharge [facility]       Complaint Diagnosis Description Type Department Provider    01/24/18  Hiatal hernia with GERD Admission (Discharged) [facility] 4W Delton Coombes, MD           Spoke with: no one    Facility: [facility]          Follow Up: Unable to reach patient. Unable to leave message. No answer - voicemail is full.  Future Appointments   Date Time Provider [place]   01/29/2018  4:00 PM Sameh Samella Parr, MD [facility]   03/24/2018  1:00 PM Arville Lime, MD [place], RN

## 2018-01-28 NOTE — Care Coordination-Inpatient (Signed)
[place] Transitions Initial Follow Up Call    Call within 2 business days of discharge: Yes    Patient: ANIAYAH ALANIZ Patient DOB: 10/04/1945   MRN: [id number]  Reason for Admission: Hiatal hernia with GERD  Discharge Date: 01/26/18 RARS: Readmission Risk Score: 16      Last Discharge [place]       Complaint Diagnosis Description Type Department Provider    01/24/18  Hiatal hernia with GERD Admission (Discharged) [facility] 4W Delton Coombes, MD           Spoke with: Clifton James (patient)    Facility: [facility]    Non-face-to-face services provided:  Obtained and reviewed discharge summary and/or continuity of care documents  Assessment and support for treatment adherence and medication management-daily med list reviewed & 1123f completed    Care Transitions 24 Hour Call    Do you have any ongoing symptoms?:  No  Do you have a copy of your discharge instructions?:  Yes  Do you have all of your prescriptions and are they filled?:  Yes  Have you been contacted by a [place]?:  No  Have you scheduled your follow up appointment?:  No  Were you discharged with any Home Care or Post Acute Services:  No  Patient DME:  Crutches  Do you have support at home?:  Partner/Spouse/SO  Do you feel like you have everything you need to keep you well at home?:  Yes  Are you an active caregiver in your home?:  No  Care Transitions Interventions         Follow Up: Spoke with Neoma Laming. She states she is fine. Abeer states she received a copy of her discharge instructions. She does not remember them being reviewed with her - she does remember being told to go over them at home. Taijah has a follow up with her surgeon scheduled for 02/10/2018. She states she will schedule with her PCP after that appt is complete. Toney states her incisions are intact - no drainage. She states she is up walking. Alissandra states she is following a full liquid diet. She is not currently driving. Ersie denies any fever.  She rates her pain at 6/10. She is taking pain medication as directed per her surgeon but feels it is not effective. Malisha states her bowels have moved 1 time post operatively. Discussed role of CTN. Lior is agreeable to follow up calls from CTN. She denies any needs today. She took writer's Psychologist, counselling.  Future Appointments   Date Time Provider [place]   01/29/2018  4:00 PM Sameh Samella Parr, MD [facility]   02/10/2018  1:00 PM Delton Coombes, MD [facility]   03/24/2018  1:00 PM Arville Lime, MD [facility]       Mariana Arn, RN

## 2018-01-29 ENCOUNTER — Encounter: Attending: Orthopaedic Surgery | Primary: Internal Medicine

## 2018-01-30 ENCOUNTER — Telehealth

## 2018-01-30 NOTE — Telephone Encounter (Signed)
Patient called states that at her last visit Dr. Bridgett Larsson looked at her knee. She states that she is still have difficulty with her knee. She is calling to obtain an order for an MRI.    Please call patient:  [phone]

## 2018-01-31 NOTE — Telephone Encounter (Signed)
Dr Bridgett Larsson, please advise. You gave pt a R knee cortisone injection, during an inpatient hospital consult, on 01/09/18. Hospital XR were taken 01/08/18.

## 2018-01-31 NOTE — Telephone Encounter (Signed)
Dr Bridgett Larsson, pt states she can not got to PT due to the hernia surgery she just had, on 01/24/18. She is very upset and wants an MRI.

## 2018-01-31 NOTE — Telephone Encounter (Signed)
Can order MRI. Will see if insurance will approve it.

## 2018-01-31 NOTE — Telephone Encounter (Signed)
I discussed with patient that she needs to go to PT at last visit.  She was noncompliant with rehab and kicked home PT out.  She was also noncompliant with brace wear and NWB as recommended by Dr. Mattie Marlin.  Start with PT.  She also just had injection 2 weeks ago, so she will not be a candidate for surgery for 4 weeks anyways.

## 2018-01-31 NOTE — Telephone Encounter (Signed)
MRI has been ordered. I spoke to pt.

## 2018-02-07 NOTE — Care Coordination-Inpatient (Signed)
[place] Transitions Follow Up Call    02/07/2018    Patient: Linda [name]  Patient DOB: [birth year]   MRN: [id number]  Reason for Admission: Hiatal Hernia with GERD  Discharge Date: 01/26/18 RARS: Readmission Risk Score: 16         Spoke with: no one    Care Transitions Subsequent and Final Call    Subsequent and Final Calls  Care Transitions Interventions  Other Interventions:            Follow Up: Final attempt at [facility] outreach. Unable to reach patient. Unable to leave message. No answer - voicemail is full.  Future Appointments   Date Time Provider [place]   02/10/2018 11:20 AM Angelica Pou, MD [facility]   02/10/2018  1:00 PM Delton Coombes, MD [facility]   03/24/2018  1:00 PM Arville Lime, MD [place], RN

## 2018-02-10 ENCOUNTER — Ambulatory Visit: Admit: 2018-02-10 | Discharge: 2018-02-10 | Payer: MEDICARE | Attending: Internal Medicine | Primary: Internal Medicine

## 2018-02-10 ENCOUNTER — Ambulatory Visit: Admit: 2018-02-10 | Discharge: 2018-02-10 | Payer: MEDICARE | Attending: Surgery | Primary: Internal Medicine

## 2018-02-10 DIAGNOSIS — K449 Diaphragmatic hernia without obstruction or gangrene: Secondary | ICD-10-CM

## 2018-02-10 DIAGNOSIS — K219 Gastro-esophageal reflux disease without esophagitis: Secondary | ICD-10-CM

## 2018-02-10 DIAGNOSIS — I10 Essential (primary) hypertension: Secondary | ICD-10-CM

## 2018-02-10 NOTE — Progress Notes (Signed)
Post Operative Visit    [facility]  [facility] and Vascular Surgery   [name], MD    [address], [address]  [place],  [address]  Phone: [phone]  Fax: [phone]    Linda [name]   Date of Birth:  [birth year]    Date of Visit:  02/10/2018    No ref. provider found  Angelica Pou, MD    Subjective:     Linda [name] presents for a two week follow-up s/p laparoscopic paraesophageal hernia repair with Nissen fundoplication. Overall she has been doing well since her operation.  She has been eating/drinking without difficulty.  She has not had any nausea and vomiting.  She has introduced minimal solids at this point into her diet, sticking mainly with liquids.   There has been some relief of her pain.  It is  controlled with her current pain regimen.  She denies any fevers or chills.    Pain Score:   1    Allergies   Allergen Reactions   ??? Actonel [Risedronate Sodium]      Osteonecrosis jaw documented     Outpatient Medications Marked as Taking for the 02/10/18 encounter (Office Visit) with Delton Coombes, MD   Medication Sig Dispense Refill   ??? docusate sodium (COLACE, DULCOLAX) 100 MG CAPS Take 100 mg by mouth 2 times daily 60 capsule 0   ??? levothyroxine (SYNTHROID) 25 MCG tablet TAKE 1 TABLET BY MOUTH EVERY DAY ON AN EMPTY STOMACH 30 tablet 1   ??? nadolol (CORGARD) 20 MG tablet Take 10 mg by mouth nightly     ??? PARoxetine (PAXIL) 40 MG tablet Take 1 tablet by mouth daily (Patient taking differently: Take 40 mg by mouth nightly ) 90 tablet 3   ??? traZODone (DESYREL) 50 MG tablet TAKE 1 TABLET BY MOUTH EVERY NIGHT AS NEEDED FOR SLEEP 90 tablet 5   ??? pantoprazole (PROTONIX) 40 MG tablet Take 1 tablet by mouth every morning (before breakfast) 30 tablet 3   ??? divalproex (DEPAKOTE ER) 500 MG extended release tablet Take 1 tablet by mouth nightly 30 tablet 3   ??? Polysaccharide Iron Complex 391.3 (180 Fe) MG CAPS One a day for six months 30 capsule 5   ??? Cobalamine  Combinations (B12 FOLATE) 800-800 MCG CAPS One a day indefinitely 30 capsule 3   ??? Calcium Carbonate-Vitamin D (CALTRATE 600+D) 600-400 MG-UNIT CHEW Take 1 tablet by mouth 2 times daily. 60 tablet 11       Vitals:    02/10/18 1319   BP: (!) 135/91   Pulse: 85   Weight: 114 lb (51.7 kg)     Body mass index is 19.57 kg/m??.     Wt Readings from Last 3 Encounters:   02/10/18 114 lb (51.7 kg)   02/10/18 125 lb 12.8 oz (57.1 kg)   01/26/18 147 lb 11.3 oz (67 kg)     BP Readings from Last 3 Encounters:   02/10/18 (!) 135/91   02/10/18 114/72   01/26/18 134/68          Objective:    CONSTITUTIONAL:  awake, alert, no apparent distress    LUNGS:  Resp easy and unlabored  ABDOMEN:  Incisions c/d/i, no erythema or induration, soft, non-distended, non-tender, voluntary guarding absent,  and hernia absent  MUSCULOSKELETAL: No edema  NEUROLOGIC:  Mental Status Exam:  Level of Alertness:   awake  Orientation:   person, place, time  SKIN: as above  Pathology:  N/A    ASSESSMENT:     Diagnosis Orders   1. Hiatal hernia with gastroesophageal reflux     2. S/P repair of paraesophageal hernia         PLAN:    Mora Bellman Schmieder is doing well s/p laparoscopic paraesophageal hernia repair with Nissen fundoplication.  Her incisions are healing well.  Pain is improving.  Ok to slowly advance to solids.  Will plan on having her follow up in 4 weeks prn.  She is to continue with lifting restrictions for the next 4 weeks to reduce the chance of hernia.  Note given to return to physical therapy for her hip.    Electronically signed by Delton Coombes, MD on 02/10/2018 at 2:08 PM

## 2018-02-10 NOTE — Progress Notes (Signed)
OUTPATIENT PROGRESS NOTE  Date of Service:  02/10/2018  Address: [facility]  [place] [address]  Dept: [phone]  Loc: [phone]    Subjective:     Chief Complaint   Patient presents with   ??? Follow-Up from Hospital     PatientID: [id number]  Linda [name] is a 72 y.o. female    HPI  Problem List Items Addressed This Visit     Essential hypertension - Primary    Intractable chronic migraine without aura    Migraine headache    Osteoporosis       D/C'd from the hospital after Nissen who has not had any further headaches on the depakote.  Tolerating thyroid medicine.  Been on it six weeks.  Not on osteoporosis medicine.  Feels depressed over not doing as much .  Drinking and eating but diet is limited     Allergies   Allergen Reactions   ??? Actonel [Risedronate Sodium]      Osteonecrosis jaw documented     Current Outpatient Medications   Medication Sig Dispense Refill   ??? docusate sodium (COLACE, DULCOLAX) 100 MG CAPS Take 100 mg by mouth 2 times daily 60 capsule 0   ??? levothyroxine (SYNTHROID) 25 MCG tablet TAKE 1 TABLET BY MOUTH EVERY DAY ON AN EMPTY STOMACH 30 tablet 1   ??? nadolol (CORGARD) 20 MG tablet Take 10 mg by mouth nightly     ??? PARoxetine (PAXIL) 40 MG tablet Take 1 tablet by mouth daily (Patient taking differently: Take 40 mg by mouth nightly ) 90 tablet 3   ??? traZODone (DESYREL) 50 MG tablet TAKE 1 TABLET BY MOUTH EVERY NIGHT AS NEEDED FOR SLEEP 90 tablet 5   ??? pantoprazole (PROTONIX) 40 MG tablet Take 1 tablet by mouth every morning (before breakfast) 30 tablet 3   ??? divalproex (DEPAKOTE ER) 500 MG extended release tablet Take 1 tablet by mouth nightly 30 tablet 3   ??? Polysaccharide Iron Complex 391.3 (180 Fe) MG CAPS One a day for six months 30 capsule 5   ??? Cobalamine Combinations (B12 FOLATE) 800-800 MCG CAPS One a day indefinitely 30 capsule 3   ??? Calcium Carbonate-Vitamin D (CALTRATE 600+D) 600-400 MG-UNIT CHEW Take 1  tablet by mouth 2 times daily. 60 tablet 11     No current facility-administered medications for this visit.      Past Medical History:   Diagnosis Date   ??? 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 Celiac artery atherosclerosis 2015    60% proximal stenosis   ??? Colon polyps     adenomatous    ??? Dyspepsia     nonulcerative   ??? Gastric ulcer    ??? Graves disease    ??? Hiatal hernia    ??? High cholesterol    ??? History of depression    ??? Hypertension     not a current issue   ??? Hypothyroidism due to Hashimoto's thyroiditis 2019   ??? 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 Migraine     recurrent, controlled with elavil   ??? Osteoarthritis of lumbar spine     Chunduri   ??? 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 Postmenopausal    ??? Thoracic compression fracture ([place])     T10 from a fall   ???  Wears partial dentures     upper 1 tooth on right     Past Surgical History:   Procedure Laterality Date   ??? ARTERY BIOPSY Right 12-24-12    Temporal Artery biopsy right    ??? BACK SURGERY      lumbar area   ??? COLONOSCOPY  [id number]    dr Awanda Mink   3 years   ??? COLONOSCOPY N/A 07/09/2017    COLONOSCOPY POLYPECTOMY SNARE performed by Orvan Seen, MD at [place]   ??? COLONOSCOPY N/A 07/09/2017    COLONOSCOPY WITH BIOPSY performed by Orvan Seen, MD at [place]   ??? ESOPHAGEAL MOTILITY STUDY N/A 08/27/2017    ESOPHAGEAL MANOMETRY performed by Orvan Seen, MD at [place]   ??? FIXATION KYPHOPLASTY      t10   ??? GASTRIC FUNDOPLICATION N/A 04/03/5620    LAPAROSCOPIC PARAESOPHAGEAL HERNIA REPAIR WITH  NISSEN FUNDOPLICATION performed by Delton Coombes, MD at [place]   ??? HIP FRACTURE SURGERY Right 11/22/2017    RIGHT HIP GAMMA NAILING performed by Arville Lime, MD at [place]   ??? HIP SURGERY Right 11/22/2017    right hip gamma nailing   ??? ORIF DISTAL RADIUS FRACTURE Right 11/22/2017    OPEN REDUCTION INTERNAL  FIXATION RIGHT RADIUS WITH C-ARM performed by Vevelyn Francois, MD at [place]   ??? SHOULDER SURGERY      left humerus fx 3 places   ??? UPPER GASTROINTESTINAL ENDOSCOPY  08/26/2014    dr Shanon Brow hess:negative   ??? UPPER GASTROINTESTINAL ENDOSCOPY N/A 08/08/2017    EGD ESOPHAGOGASTRODUODENOSCOPY performed by Orvan Seen, MD at [place]   ??? UPPER GASTROINTESTINAL ENDOSCOPY N/A 01/08/2018    EGD BIOPSY performed by Orvan Seen, MD at [place]   ??? WRIST FRACTURE SURGERY Right 11/22/2017     Social History     Tobacco Use   ??? Smoking status: Former Smoker     Packs/day: 0.00     Years: 40.00     Pack years: 0.00     Last attempt to quit: 08/06/1997     Years since quitting: 20.5   ??? Smokeless tobacco: Never Used   ??? Tobacco comment: rarely now smokes   Substance Use Topics   ??? Alcohol use: Yes     Alcohol/week: 0.0 oz     Comment: rarely, socially   ??? Drug use: No     Family History   Problem Relation Age of Onset   ??? Migraines Other    ??? Diabetes Other    ??? Cancer Mother    ??? Breast Cancer Mother    ??? Cancer Father         bone Ca          Review of Systems   Constitutional: Positive for unexpected weight change.   Neurological: Positive for tremors.        Tremor   All other systems reviewed and are negative.      Objective:   Physical Exam   Constitutional: She is oriented to person, place, and time. She appears well-developed and well-nourished. No distress.   Frail appearing  A little tremor  pale   HENT:   Head: Normocephalic and atraumatic.   Right Ear: External ear normal.   Left Ear: External ear normal.   Nose: Nose normal.   Mouth/Throat: Oropharynx is clear and moist.   Eyes: Pupils are equal, round, and reactive  to light. EOM are normal. Right eye exhibits no discharge. Left eye exhibits no discharge.   Neck: Normal range of motion. Neck supple. No JVD present. No tracheal deviation present. No thyromegaly present.   Cardiovascular: Normal rate, regular rhythm and normal heart sounds. Exam reveals no  gallop.   No murmur heard.  Tachy rests goes to 100   Pulmonary/Chest: Effort normal and breath sounds normal. No stridor. No respiratory distress.   Abdominal: Soft. Bowel sounds are normal. She exhibits no distension. There is no tenderness.   Musculoskeletal: Normal range of motion. She exhibits no edema.   Lymphadenopathy:     She has no cervical adenopathy.   Neurological: She is alert and oriented to person, place, and time. She has normal reflexes.   Skin: Skin is warm and dry. She is not diaphoretic.   Psychiatric: She has a normal mood and affect. Her behavior is normal. Judgment and thought content normal.   Nursing note and vitals reviewed.        Assessment/Plan     1. Essential hypertension  loweredf the nadolol because bp was too low  bp controlled hr higher    2. Chronic migraine without aura without status migrainosus, not intractable  Controlled on current meds    3. Osteoporosis, unspecified osteoporosis type, unspecified pathological fracture presence  Definitely needs to be on something  Will arrange reclast next tiem  4.  hashimotos hypothyroid  Check TSH today  And cbc                     Angelica Pou, MD

## 2018-02-12 ENCOUNTER — Encounter

## 2018-02-12 LAB — CBC WITH AUTO DIFFERENTIAL
Basophils %: 0.4 %
Basophils Absolute: 0 10*3/uL (ref 0.0–0.2)
Eosinophils %: 3.1 %
Eosinophils Absolute: 0.2 10*3/uL (ref 0.0–0.6)
Hematocrit: 40.5 % (ref 36.0–48.0)
Hemoglobin: 13 g/dL (ref 12.0–16.0)
Lymphocytes %: 45.5 %
Lymphocytes Absolute: 3 10*3/uL (ref 1.0–5.1)
MCH: 32.9 pg (ref 26.0–34.0)
MCHC: 32.2 g/dL (ref 31.0–36.0)
MCV: 101.9 fL — ABNORMAL HIGH (ref 80.0–100.0)
MPV: 7.9 fL (ref 5.0–10.5)
Monocytes %: 7 %
Monocytes Absolute: 0.5 10*3/uL (ref 0.0–1.3)
Neutrophils %: 44 %
Neutrophils Absolute: 2.9 10*3/uL (ref 1.7–7.7)
Platelets: 273 10*3/uL (ref 135–450)
RBC: 3.97 M/uL — ABNORMAL LOW (ref 4.00–5.20)
RDW: 17.6 % — ABNORMAL HIGH (ref 12.4–15.4)
WBC: 6.5 10*3/uL (ref 4.0–11.0)

## 2018-02-12 LAB — TSH: TSH: 3.25 u[IU]/mL (ref 0.27–4.20)

## 2018-02-14 ENCOUNTER — Encounter

## 2018-02-14 MED ORDER — LEVOTHYROXINE SODIUM 25 MCG PO TABS
25 MCG | ORAL_TABLET | ORAL | 5 refills | Status: DC
Start: 2018-02-14 — End: 2018-02-13

## 2018-02-14 MED ORDER — LEVOTHYROXINE SODIUM 25 MCG PO TABS
25 MCG | ORAL_TABLET | ORAL | 5 refills | Status: DC
Start: 2018-02-14 — End: 2018-08-15

## 2018-02-20 MED ORDER — OMEPRAZOLE 40 MG PO CPDR
40 MG | ORAL_CAPSULE | Freq: Every day | ORAL | 5 refills | Status: DC
Start: 2018-02-20 — End: 2018-02-20

## 2018-02-21 MED ORDER — OMEPRAZOLE 40 MG PO CPDR
40 MG | ORAL_CAPSULE | Freq: Every day | ORAL | 2 refills | Status: DC
Start: 2018-02-21 — End: 2018-10-27

## 2018-03-06 ENCOUNTER — Encounter: Attending: Surgery | Primary: Internal Medicine

## 2018-03-19 MED ORDER — METHYLPREDNISOLONE 4 MG PO TBPK
4 MG | PACK | ORAL | 0 refills | Status: AC
Start: 2018-03-19 — End: 2018-03-25

## 2018-03-19 NOTE — Telephone Encounter (Signed)
Patient is calling because she states that she has some type of poison ivy or oak and she has been using calamine lotion and its not working so she is hoping a pill or cream could be called in for her she states she does have an appt for Monday but doesn't think she should wait until Monday     Murphys Camas, [place]    Please Advise   CB# [phone]

## 2018-03-19 NOTE — Telephone Encounter (Signed)
Will send  medrol dose pack #1 to use as directed.

## 2018-03-19 NOTE — Telephone Encounter (Signed)
Patient notified

## 2018-03-24 ENCOUNTER — Encounter: Attending: Orthopaedic Surgery | Primary: Internal Medicine

## 2018-03-24 ENCOUNTER — Encounter: Payer: MEDICARE | Attending: Internal Medicine | Primary: Internal Medicine

## 2018-04-03 NOTE — Telephone Encounter (Signed)
Dr Garen Grams isa aware of patients no shows; does not want patient discharged.

## 2018-05-12 MED ORDER — TRAZODONE HCL 50 MG PO TABS
50 MG | ORAL_TABLET | ORAL | 5 refills | Status: DC
Start: 2018-05-12 — End: 2018-10-27

## 2018-06-24 ENCOUNTER — Ambulatory Visit: Admit: 2018-06-24 | Discharge: 2018-06-24 | Payer: MEDICARE | Attending: Internal Medicine | Primary: Internal Medicine

## 2018-06-24 ENCOUNTER — Encounter

## 2018-06-24 DIAGNOSIS — M542 Cervicalgia: Secondary | ICD-10-CM

## 2018-06-24 MED ORDER — CYCLOBENZAPRINE HCL 10 MG PO TABS
10 MG | ORAL_TABLET | Freq: Three times a day (TID) | ORAL | 0 refills | Status: DC | PRN
Start: 2018-06-24 — End: 2018-07-04

## 2018-06-24 MED ORDER — PROMETHAZINE HCL 12.5 MG PO TABS
12.5 MG | ORAL_TABLET | Freq: Four times a day (QID) | ORAL | 0 refills | Status: AC | PRN
Start: 2018-06-24 — End: 2018-07-01

## 2018-06-24 NOTE — Progress Notes (Signed)
OUTPATIENT PROGRESS NOTE  Date of Service:  06/24/2018  Address: [address] [place] [address]  Dept: [phone]  Loc: [phone]    Subjective:     Chief Complaint   Patient presents with   ??? Neck Pain     PatientID: <[id number]>  Linda [name] is a 72 y.o. female    HPI  Reports 10 days of pain across upper back and neck .  Hurts to turn neck but notshoulders.  Only used topicals.  No obvious cause.  Did have left humerus fx 3places surgery never healed fully.  Clicks with moving but doesn't normally hurt    Allergies   Allergen Reactions   ??? Actonel [Risedronate Sodium]      Osteonecrosis jaw documented     Current Outpatient Medications   Medication Sig Dispense Refill   ??? traZODone (DESYREL) 50 MG tablet TAKE 1 TABLET BY MOUTH EVERY NIGHT AS NEEDED FOR SLEEP 90 tablet 5   ??? omeprazole (PRILOSEC) 40 MG delayed release capsule TAKE 1 CAPSULE BY MOUTH DAILY 90 capsule 2   ??? levothyroxine (SYNTHROID) 25 MCG tablet TAKE 1 TABLET BY MOUTH ON ODD DAYS AND 2 TABLETS ON EVEN DAYS 135 tablet 5   ??? docusate sodium (COLACE, DULCOLAX) 100 MG CAPS Take 100 mg by mouth 2 times daily 60 capsule 0   ??? nadolol (CORGARD) 20 MG tablet Take 10 mg by mouth nightly     ??? PARoxetine (PAXIL) 40 MG tablet Take 1 tablet by mouth daily (Patient taking differently: Take 40 mg by mouth nightly ) 90 tablet 3   ??? traZODone (DESYREL) 50 MG tablet TAKE 1 TABLET BY MOUTH EVERY NIGHT AS NEEDED FOR SLEEP 90 tablet 5   ??? pantoprazole (PROTONIX) 40 MG tablet Take 1 tablet by mouth every morning (before breakfast) 30 tablet 3   ??? divalproex (DEPAKOTE ER) 500 MG extended release tablet Take 1 tablet by mouth nightly 30 tablet 3   ??? Polysaccharide Iron Complex 391.3 (180 Fe) MG CAPS One a day for six months 30 capsule 5   ??? Cobalamine Combinations (B12 FOLATE) 800-800 MCG CAPS One a day indefinitely 30 capsule 3   ??? Calcium Carbonate-Vitamin D (CALTRATE 600+D) 600-400 MG-UNIT  CHEW Take 1 tablet by mouth 2 times daily. 60 tablet 11     No current facility-administered medications for this visit.      Past Medical History:   Diagnosis Date   ??? Bisphosphonate-associated osteonecrosis of the jaw [facility])    ??? Celiac artery atherosclerosis 2015    60% proximal stenosis   ??? Colon polyps     adenomatous    ??? Dyspepsia     nonulcerative   ??? Gastric ulcer    ??? Graves disease    ??? Hiatal hernia    ??? High cholesterol    ??? History of depression    ??? Hypertension     not a current issue   ??? Hypothyroidism due to Hashimoto's thyroiditis 2019   ??? 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 Migraine     recurrent, controlled with elavil   ??? Osteoarthritis of lumbar spine     Chunduri   ??? 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 Postmenopausal    ??? Thoracic compression fracture ([place])     T10 from a  fall   ??? Wears partial dentures     upper 1 tooth on right     Past Surgical History:   Procedure Laterality Date   ??? ARTERY BIOPSY Right 12-24-12    Temporal Artery biopsy right    ??? BACK SURGERY      lumbar area   ??? COLONOSCOPY  [id number]    dr Awanda Mink   3 years   ??? COLONOSCOPY N/A 07/09/2017    COLONOSCOPY POLYPECTOMY SNARE performed by Orvan Seen, MD at [place]   ??? COLONOSCOPY N/A 07/09/2017    COLONOSCOPY WITH BIOPSY performed by Orvan Seen, MD at [place]   ??? ESOPHAGEAL MOTILITY STUDY N/A 08/27/2017    ESOPHAGEAL MANOMETRY performed by Orvan Seen, MD at [place]   ??? FIXATION KYPHOPLASTY      t10   ??? GASTRIC FUNDOPLICATION N/A 9/38/1829    LAPAROSCOPIC PARAESOPHAGEAL HERNIA REPAIR WITH  NISSEN FUNDOPLICATION performed by Delton Coombes, MD at [place]   ??? HIP FRACTURE SURGERY Right 11/22/2017    RIGHT HIP GAMMA NAILING performed by Arville Lime, MD at [place]   ??? HIP SURGERY Right 11/22/2017    right hip gamma nailing   ??? ORIF DISTAL RADIUS FRACTURE Right 11/22/2017    OPEN REDUCTION  INTERNAL FIXATION RIGHT RADIUS WITH C-ARM performed by Vevelyn Francois, MD at [place]   ??? SHOULDER SURGERY      left humerus fx 3 places   ??? UPPER GASTROINTESTINAL ENDOSCOPY  08/26/2014    dr Shanon Brow hess:negative   ??? UPPER GASTROINTESTINAL ENDOSCOPY N/A 08/08/2017    EGD ESOPHAGOGASTRODUODENOSCOPY performed by Orvan Seen, MD at [place]   ??? UPPER GASTROINTESTINAL ENDOSCOPY N/A 01/08/2018    EGD BIOPSY performed by Orvan Seen, MD at [place]   ??? WRIST FRACTURE SURGERY Right 11/22/2017     Social History     Tobacco Use   ??? Smoking status: Former Smoker     Packs/day: 0.00     Years: 40.00     Pack years: 0.00     Last attempt to quit: 08/06/1997     Years since quitting: 20.8   ??? Smokeless tobacco: Never Used   ??? Tobacco comment: rarely now smokes   Substance Use Topics   ??? Alcohol use: Yes     Alcohol/week: 0.0 standard drinks     Comment: rarely, socially   ??? Drug use: No     Family History   Problem Relation Age of Onset   ??? Migraines Other    ??? Diabetes Other    ??? Cancer Mother    ??? Breast Cancer Mother    ??? Cancer Father         bone Ca          Review of Systems    Objective:   Physical Exam  Able to move neck side to side fully  Left shoulder clicks with full abduction and lowering back down  UE dtrs and strength intact   Very tender along trapezius    Assessment/Plan     1.  Severe neck pain may be from trapezius muscle spasm and left shoulder abnormality  Had been turning neck more than normal  Plan try flexeril   Phenergan prn  And refer to massage therapy    2. Due for repeat TSH    Angelica Pou, MD

## 2018-06-25 ENCOUNTER — Encounter: Attending: Internal Medicine | Primary: Internal Medicine

## 2018-06-25 LAB — TSH: TSH: 2.77 u[IU]/mL (ref 0.27–4.20)

## 2018-06-30 ENCOUNTER — Ambulatory Visit: Admit: 2018-06-30 | Discharge: 2018-06-30 | Payer: MEDICARE | Attending: Internal Medicine | Primary: Internal Medicine

## 2018-06-30 DIAGNOSIS — M542 Cervicalgia: Secondary | ICD-10-CM

## 2018-06-30 LAB — POCT URINALYSIS DIPSTICK
Bilirubin, UA: NEGATIVE
Glucose, UA POC: NEGATIVE
Ketones, UA: NEGATIVE
Nitrite, UA: NEGATIVE
Protein, UA POC: NEGATIVE
Spec Grav, UA: 1.02
Urobilinogen, UA: 0.2
pH, UA: 5.5

## 2018-06-30 MED ORDER — MIRABEGRON ER 25 MG PO TB24
25 | ORAL_TABLET | Freq: Every day | ORAL | 1 refills | Status: AC
Start: 2018-06-30 — End: ?

## 2018-06-30 NOTE — Progress Notes (Signed)
OUTPATIENT PROGRESS NOTE  Date of Service:  06/30/2018  Address: [facility]  [place] [address]  Dept: [phone]  Loc: [phone]    Subjective:     Chief Complaint   Patient presents with   ??? Neck Pain     PatientID: <[id number]>  Linda [name] is a 72 y.o. female    HPI  Was here last week with new onset neck pain and has severe pain that has not let up.  Flexeril helps a little but doesn't fix anything.  Able to sleep at night.    She could not afford the massage but is willing to see PT.      Also reports 3 years of mixed incontinence , never been evaluated.  Much comes out.    No dysuria, not cloudy, no recent urine check.  No recent gynecologic check no discharge.    Allergies   Allergen Reactions   ??? Actonel [Risedronate Sodium]      Osteonecrosis jaw documented     Current Outpatient Medications   Medication Sig Dispense Refill   ??? cyclobenzaprine (FLEXERIL) 10 MG tablet Take 1 tablet by mouth 3 times daily as needed for Muscle spasms 30 tablet 0   ??? promethazine (PHENERGAN) 12.5 MG tablet Take 1 tablet by mouth 4 times daily as needed for Nausea 20 tablet 0   ??? traZODone (DESYREL) 50 MG tablet TAKE 1 TABLET BY MOUTH EVERY NIGHT AS NEEDED FOR SLEEP 90 tablet 5   ??? omeprazole (PRILOSEC) 40 MG delayed release capsule TAKE 1 CAPSULE BY MOUTH DAILY 90 capsule 2   ??? levothyroxine (SYNTHROID) 25 MCG tablet TAKE 1 TABLET BY MOUTH ON ODD DAYS AND 2 TABLETS ON EVEN DAYS 135 tablet 5   ??? docusate sodium (COLACE, DULCOLAX) 100 MG CAPS Take 100 mg by mouth 2 times daily 60 capsule 0   ??? nadolol (CORGARD) 20 MG tablet Take 10 mg by mouth nightly     ??? PARoxetine (PAXIL) 40 MG tablet Take 1 tablet by mouth daily (Patient taking differently: Take 40 mg by mouth nightly ) 90 tablet 3   ??? pantoprazole (PROTONIX) 40 MG tablet Take 1 tablet by mouth every morning (before breakfast) 30 tablet 3   ??? divalproex (DEPAKOTE ER) 500 MG extended release  tablet Take 1 tablet by mouth nightly 30 tablet 3   ??? Polysaccharide Iron Complex 391.3 (180 Fe) MG CAPS One a day for six months 30 capsule 5   ??? Cobalamine Combinations (B12 FOLATE) 800-800 MCG CAPS One a day indefinitely 30 capsule 3   ??? Calcium Carbonate-Vitamin D (CALTRATE 600+D) 600-400 MG-UNIT CHEW Take 1 tablet by mouth 2 times daily. 60 tablet 11     No current facility-administered medications for this visit.      Past Medical History:   Diagnosis Date   ??? Bisphosphonate-associated osteonecrosis of the jaw [facility])    ??? Celiac artery atherosclerosis 2015    60% proximal stenosis   ??? Colon polyps     adenomatous    ??? Dyspepsia     nonulcerative   ??? Gastric ulcer    ??? Graves disease    ??? Hiatal hernia    ??? High cholesterol    ??? History of depression    ??? Hypertension     not a current issue   ??? Hypothyroidism due to Hashimoto's thyroiditis 2019   ??? Iron deficiency anemia secondary to blood loss (chronic)  08/09/2017   ??? 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 Migraine     recurrent, controlled with elavil   ??? Osteoarthritis of lumbar spine     Chunduri   ??? 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 Postmenopausal    ??? Thoracic compression fracture ([facility])     T10 from a fall   ??? Wears partial dentures     upper 1 tooth on right     Past Surgical History:   Procedure Laterality Date   ??? ARTERY BIOPSY Right 12-24-12    Temporal Artery biopsy right    ??? BACK SURGERY      lumbar area   ??? COLONOSCOPY  [id number]    dr Awanda Mink   3 years   ??? COLONOSCOPY N/A 07/09/2017    COLONOSCOPY POLYPECTOMY SNARE performed by Orvan Seen, MD at [place]   ??? COLONOSCOPY N/A 07/09/2017    COLONOSCOPY WITH BIOPSY performed by Orvan Seen, MD at [place]   ??? ESOPHAGEAL MOTILITY STUDY N/A 08/27/2017    ESOPHAGEAL MANOMETRY performed by Orvan Seen, MD at [place]   ??? FIXATION KYPHOPLASTY      t10   ??? GASTRIC FUNDOPLICATION N/A 7/32/2025    LAPAROSCOPIC PARAESOPHAGEAL HERNIA REPAIR WITH  NISSEN  FUNDOPLICATION performed by Delton Coombes, MD at [place]   ??? HIP FRACTURE SURGERY Right 11/22/2017    RIGHT HIP GAMMA NAILING performed by Arville Lime, MD at [place]   ??? HIP SURGERY Right 11/22/2017    right hip gamma nailing   ??? ORIF DISTAL RADIUS FRACTURE Right 11/22/2017    OPEN REDUCTION INTERNAL FIXATION RIGHT RADIUS WITH C-ARM performed by Vevelyn Francois, MD at [place]   ??? SHOULDER SURGERY      left humerus fx 3 places   ??? UPPER GASTROINTESTINAL ENDOSCOPY  08/26/2014    dr Shanon Brow hess:negative   ??? UPPER GASTROINTESTINAL ENDOSCOPY N/A 08/08/2017    EGD ESOPHAGOGASTRODUODENOSCOPY performed by Orvan Seen, MD at [place]   ??? UPPER GASTROINTESTINAL ENDOSCOPY N/A 01/08/2018    EGD BIOPSY performed by Orvan Seen, MD at [place]   ??? WRIST FRACTURE SURGERY Right 11/22/2017     Social History     Tobacco Use   ??? Smoking status: Former Smoker     Packs/day: 0.00     Years: 40.00     Pack years: 0.00     Last attempt to quit: 08/06/1997     Years since quitting: 20.9   ??? Smokeless tobacco: Never Used   ??? Tobacco comment: rarely now smokes   Substance Use Topics   ??? Alcohol use: Yes     Alcohol/week: 0.0 standard drinks     Comment: rarely, socially   ??? Drug use: No     Family History   Problem Relation Age of Onset   ??? Migraines Other    ??? Diabetes Other    ??? Cancer Mother    ??? Breast Cancer Mother    ??? Cancer Father         bone Ca          Review of Systems    Objective:   Physical Exam  Tender over left trapezius and with rotation to the left   UE strgth and dtrs intact but decreased ROM left shoulder      Low midline scar under belly button  Firmness under scar  Assessment/Plan     left neck and left trapezius pain  Plan refer to PT first   If still hurting will pursue imagin    2.  Onset mixed incontinence  Plan check ua and try myrbetriq   Refer to [employer]  Angelica Pou, MD

## 2018-07-02 ENCOUNTER — Encounter

## 2018-07-04 ENCOUNTER — Encounter

## 2018-07-04 MED ORDER — CYCLOBENZAPRINE HCL 10 MG PO TABS
10 MG | ORAL_TABLET | ORAL | 0 refills | Status: DC
Start: 2018-07-04 — End: 2018-08-04

## 2018-07-08 ENCOUNTER — Ambulatory Visit: Admit: 2018-07-08 | Discharge: 2018-07-08 | Payer: MEDICARE | Attending: Internal Medicine | Primary: Internal Medicine

## 2018-07-08 DIAGNOSIS — M5412 Radiculopathy, cervical region: Secondary | ICD-10-CM

## 2018-07-08 LAB — URINALYSIS WITH MICROSCOPIC
Bilirubin Urine: NEGATIVE
Epithelial Cells, UA: 1 /HPF (ref 0–5)
Glucose, Ur: NEGATIVE mg/dL
Hyaline Casts, UA: 1 /LPF (ref 0–8)
Ketones, Urine: NEGATIVE mg/dL
Leukocyte Esterase, Urine: NEGATIVE
Nitrite, Urine: NEGATIVE
Protein, UA: NEGATIVE mg/dL
RBC, UA: 3 /HPF (ref 0–4)
Specific Gravity, UA: 1.018 (ref 1.005–1.030)
Urobilinogen, Urine: 0.2 E.U./dL (ref <2.0)
WBC, UA: 1 /HPF (ref 0–5)
pH, UA: 5.5 (ref 5.0–8.0)

## 2018-07-08 MED ORDER — METHYLPREDNISOLONE 4 MG PO TBPK
4 MG | PACK | ORAL | 0 refills | Status: AC
Start: 2018-07-08 — End: 2018-07-14

## 2018-07-08 MED ORDER — OXYBUTYNIN CHLORIDE ER 10 MG PO TB24
10 MG | ORAL_TABLET | Freq: Every day | ORAL | 3 refills | Status: DC
Start: 2018-07-08 — End: 2018-07-08

## 2018-07-08 NOTE — Progress Notes (Signed)
OUTPATIENT PROGRESS NOTE  Date of Service:  07/08/2018  Address: [facility]  [place] [address]  Dept: [phone]  Loc: [phone]    Subjective:     Chief Complaint   Patient presents with   ??? Urinary Frequency     PatientID: <[id number]>  Linda [name] is a 72 y.o. female    HPI  Problem List Items Addressed This Visit     None      Visit Diagnoses     Cervical radiculopathy    -  Primary neck has been hurting for over a month hurts when she turns her neck to the left and down upper aspect of left arm.  Had prior humerus fracture which hurts chronically.  This pain and problem is new and superimposed    Relevant Orders    MRI Cervical Spine WO Contrast    Complication of repair of pelvic floor disorder    Having frequent urination and has developed incontinence told in past she has prolapsed bladder  Urine done last time showed large blood and wbc no nitrites    Relevant Orders    AFL - Roedersheimer, Wells Guiles, MD, [facility] [place]           Allergies   Allergen Reactions   ??? Actonel [Risedronate Sodium]      Osteonecrosis jaw documented     Current Outpatient Medications   Medication Sig Dispense Refill   ??? methylPREDNISolone (MEDROL DOSEPACK) 4 MG tablet Take by mouth. 1 kit 0   ??? oxybutynin (DITROPAN XL) 10 MG extended release tablet Take 1 tablet by mouth daily At night for urinary incontinence 30 tablet 3   ??? cyclobenzaprine (FLEXERIL) 10 MG tablet TAKE 1 TABLET BY MOUTH THREE TIMES DAILY AS NEEDED FOR MUSCLE SPASMS 30 tablet 0   ??? mirabegron (MYRBETRIQ) 25 MG TB24 Take 1 tablet by mouth daily 30 tablet 1   ??? traZODone (DESYREL) 50 MG tablet TAKE 1 TABLET BY MOUTH EVERY NIGHT AS NEEDED FOR SLEEP 90 tablet 5   ??? omeprazole (PRILOSEC) 40 MG delayed release capsule TAKE 1 CAPSULE BY MOUTH DAILY 90 capsule 2   ??? levothyroxine (SYNTHROID) 25 MCG tablet TAKE 1 TABLET BY MOUTH ON ODD DAYS AND 2 TABLETS ON EVEN DAYS 135  tablet 5   ??? docusate sodium (COLACE, DULCOLAX) 100 MG CAPS Take 100 mg by mouth 2 times daily 60 capsule 0   ??? nadolol (CORGARD) 20 MG tablet Take 10 mg by mouth nightly     ??? PARoxetine (PAXIL) 40 MG tablet Take 1 tablet by mouth daily (Patient taking differently: Take 40 mg by mouth nightly ) 90 tablet 3   ??? pantoprazole (PROTONIX) 40 MG tablet Take 1 tablet by mouth every morning (before breakfast) 30 tablet 3   ??? divalproex (DEPAKOTE ER) 500 MG extended release tablet Take 1 tablet by mouth nightly 30 tablet 3   ??? Polysaccharide Iron Complex 391.3 (180 Fe) MG CAPS One a day for six months 30 capsule 5   ??? Cobalamine Combinations (B12 FOLATE) 800-800 MCG CAPS One a day indefinitely 30 capsule 3   ??? Calcium Carbonate-Vitamin D (CALTRATE 600+D) 600-400 MG-UNIT CHEW Take 1 tablet by mouth 2 times daily. 60 tablet 11     No current facility-administered medications for this visit.      Past Medical History:   Diagnosis Date   ??? Bisphosphonate-associated osteonecrosis of the jaw [facility])    ???  Celiac artery atherosclerosis 2015    60% proximal stenosis   ??? Colon polyps     adenomatous    ??? Dyspepsia     nonulcerative   ??? Gastric ulcer    ??? Graves disease    ??? Hiatal hernia    ??? High cholesterol    ??? History of depression    ??? Hypertension     not a current issue   ??? Hypothyroidism due to Hashimoto's thyroiditis 2019   ??? 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 Migraine     recurrent, controlled with elavil   ??? Osteoarthritis of lumbar spine     Chunduri   ??? 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 Postmenopausal    ??? Thoracic compression fracture (HCC)     T10 from a fall   ??? Wears partial dentures     upper 1 tooth on right     Past Surgical History:   Procedure Laterality Date   ??? ARTERY BIOPSY Right 12-24-12    Temporal Artery biopsy right    ??? BACK SURGERY      lumbar area   ??? COLONOSCOPY  [id number]    dr Awanda Mink   3 years    ??? COLONOSCOPY N/A 07/09/2017    COLONOSCOPY POLYPECTOMY SNARE performed by Orvan Seen, MD at [place]   ??? COLONOSCOPY N/A 07/09/2017    COLONOSCOPY WITH BIOPSY performed by Orvan Seen, MD at [place]   ??? ESOPHAGEAL MOTILITY STUDY N/A 08/27/2017    ESOPHAGEAL MANOMETRY performed by Orvan Seen, MD at [place]   ??? FIXATION KYPHOPLASTY      t10   ??? GASTRIC FUNDOPLICATION N/A 01/31/3150    LAPAROSCOPIC PARAESOPHAGEAL HERNIA REPAIR WITH  NISSEN FUNDOPLICATION performed by Delton Coombes, MD at [place]   ??? HIP FRACTURE SURGERY Right 11/22/2017    RIGHT HIP GAMMA NAILING performed by Arville Lime, MD at [place]   ??? HIP SURGERY Right 11/22/2017    right hip gamma nailing   ??? ORIF DISTAL RADIUS FRACTURE Right 11/22/2017    OPEN REDUCTION INTERNAL FIXATION RIGHT RADIUS WITH C-ARM performed by Vevelyn Francois, MD at [place]   ??? SHOULDER SURGERY      left humerus fx 3 places   ??? UPPER GASTROINTESTINAL ENDOSCOPY  08/26/2014    dr Shanon Brow hess:negative   ??? UPPER GASTROINTESTINAL ENDOSCOPY N/A 08/08/2017    EGD ESOPHAGOGASTRODUODENOSCOPY performed by Orvan Seen, MD at [place]   ??? UPPER GASTROINTESTINAL ENDOSCOPY N/A 01/08/2018    EGD BIOPSY performed by Orvan Seen, MD at [place]   ??? WRIST FRACTURE SURGERY Right 11/22/2017     Social History     Tobacco Use   ??? Smoking status: Former Smoker     Packs/day: 0.00     Years: 40.00     Pack years: 0.00     Last attempt to quit: 08/06/1997     Years since quitting: 20.9   ??? Smokeless tobacco: Never Used   ??? Tobacco comment: rarely now smokes   Substance Use Topics   ??? Alcohol use: Yes     Alcohol/week: 0.0 standard drinks     Comment: rarely, socially   ??? Drug use: No     Family History   Problem Relation Age of Onset   ???  Migraines Other    ??? Diabetes Other    ??? Cancer Mother    ??? Breast Cancer Mother    ??? Cancer Father         bone Ca          Review of Systems  Neck pain, urinary incont  Objective:   Physical Exam  Constitutional:        Appearance: Normal appearance.   Musculoskeletal:      Comments: Tender over  c spine and with rotation to the left  Pressure onto head does not reproduce radicular pain  Tender over left shoulder   Neurological:      Mental Status: She is alert.       nontender of flanks     Assessment/Plan     1. Cervical radiculopathy  Suspect pinched nerve new  Add medrol dose pack  - MRI Cervical Spine WO Contrast; Future    2. Complication of repair of pelvic floor disorder  Abnormal ua repeat with microscopic and culture  - AFL - Roedersheimer, Wells Guiles, MD, [facility] [place]  Could not afford myrbetriq try ditropan                 Angelica Pou, MD

## 2018-07-09 LAB — CULTURE, URINE: Urine Culture, Routine: NO GROWTH

## 2018-07-09 MED ORDER — OXYBUTYNIN CHLORIDE ER 10 MG PO TB24
10 MG | ORAL_TABLET | Freq: Every day | ORAL | 0 refills | Status: DC
Start: 2018-07-09 — End: 2018-08-15

## 2018-07-10 ENCOUNTER — Inpatient Hospital Stay: Admit: 2018-07-10 | Payer: MEDICARE | Primary: Internal Medicine

## 2018-07-10 ENCOUNTER — Encounter

## 2018-07-10 DIAGNOSIS — Z1231 Encounter for screening mammogram for malignant neoplasm of breast: Secondary | ICD-10-CM

## 2018-07-10 NOTE — Other (Signed)
mammo fine please notify patient and update Health Maintenance section

## 2018-07-18 ENCOUNTER — Inpatient Hospital Stay: Admit: 2018-07-18 | Payer: MEDICARE | Primary: Internal Medicine

## 2018-07-18 DIAGNOSIS — M5412 Radiculopathy, cervical region: Secondary | ICD-10-CM

## 2018-07-21 ENCOUNTER — Encounter

## 2018-08-02 ENCOUNTER — Encounter

## 2018-08-04 ENCOUNTER — Encounter

## 2018-08-04 ENCOUNTER — Ambulatory Visit: Admit: 2018-08-04 | Discharge: 2018-08-04 | Payer: MEDICARE | Attending: Internal Medicine | Primary: Internal Medicine

## 2018-08-04 DIAGNOSIS — M5412 Radiculopathy, cervical region: Secondary | ICD-10-CM

## 2018-08-04 MED ORDER — GABAPENTIN 100 MG PO CAPS
100 MG | ORAL_CAPSULE | ORAL | 3 refills | Status: DC
Start: 2018-08-04 — End: 2019-04-27

## 2018-08-04 MED ORDER — CYCLOBENZAPRINE HCL 10 MG PO TABS
10 MG | ORAL_TABLET | ORAL | 0 refills | Status: DC
Start: 2018-08-04 — End: 2018-08-05

## 2018-08-04 NOTE — Progress Notes (Signed)
OUTPATIENT PROGRESS NOTE  Date of Service:  08/04/2018  Address: [place] [address]  Dept: [phone]  Loc: [phone]    Subjective:     Chief Complaint   Patient presents with   ??? Neck Pain     PatientID: <[id number]>  Linda [name] is a 72 y.o. female    HPI  Here to go over MRI for work up on Neck pain.  She has good and bad days of pain that runs over her left side of her neck and into the left shoulder, arm and left side of head.  It can be severe at times with no weakness  In the arm.    MRI only mild to moderate canal stenosis.  Every level moderate to severe stenosis both radicular nerves multiple levels.    Allergies   Allergen Reactions   ??? Actonel [Risedronate Sodium]      Osteonecrosis jaw documented   ??? Nsaids      Stomach ulcers, multiple cameron ulcers     Current Outpatient Medications   Medication Sig Dispense Refill   ??? oxybutynin (DITROPAN-XL) 10 MG extended release tablet TAKE 1 TABLET BY MOUTH DAILY AT NIGHT FOR URINARY INCONTINENCE 90 tablet 0   ??? cyclobenzaprine (FLEXERIL) 10 MG tablet TAKE 1 TABLET BY MOUTH THREE TIMES DAILY AS NEEDED FOR MUSCLE SPASMS 30 tablet 0   ??? mirabegron (MYRBETRIQ) 25 MG TB24 Take 1 tablet by mouth daily 30 tablet 1   ??? traZODone (DESYREL) 50 MG tablet TAKE 1 TABLET BY MOUTH EVERY NIGHT AS NEEDED FOR SLEEP 90 tablet 5   ??? omeprazole (PRILOSEC) 40 MG delayed release capsule TAKE 1 CAPSULE BY MOUTH DAILY 90 capsule 2   ??? levothyroxine (SYNTHROID) 25 MCG tablet TAKE 1 TABLET BY MOUTH ON ODD DAYS AND 2 TABLETS ON EVEN DAYS 135 tablet 5   ??? docusate sodium (COLACE, DULCOLAX) 100 MG CAPS Take 100 mg by mouth 2 times daily 60 capsule 0   ??? nadolol (CORGARD) 20 MG tablet Take 10 mg by mouth nightly     ??? PARoxetine (PAXIL) 40 MG tablet Take 1 tablet by mouth daily (Patient taking differently: Take 40 mg by mouth nightly ) 90 tablet 3   ??? pantoprazole (PROTONIX) 40 MG tablet Take 1  tablet by mouth every morning (before breakfast) 30 tablet 3   ??? divalproex (DEPAKOTE ER) 500 MG extended release tablet Take 1 tablet by mouth nightly 30 tablet 3   ??? Polysaccharide Iron Complex 391.3 (180 Fe) MG CAPS One a day for six months 30 capsule 5   ??? Cobalamine Combinations (B12 FOLATE) 800-800 MCG CAPS One a day indefinitely 30 capsule 3   ??? Calcium Carbonate-Vitamin D (CALTRATE 600+D) 600-400 MG-UNIT CHEW Take 1 tablet by mouth 2 times daily. 60 tablet 11     No current facility-administered medications for this visit.      Past Medical History:   Diagnosis Date   ??? Bisphosphonate-associated osteonecrosis of the jaw [facility])    ??? Celiac artery atherosclerosis 2015    60% proximal stenosis   ??? Colon polyps     adenomatous    ??? Dyspepsia     nonulcerative   ??? Gastric ulcer    ??? Graves disease    ??? Hiatal hernia    ??? High cholesterol    ??? History of depression    ??? Hypertension     not a  current issue   ??? Hypothyroidism due to Hashimoto's thyroiditis 2019   ??? 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 Migraine     recurrent, controlled with elavil   ??? Osteoarthritis of lumbar spine     Chunduri   ??? 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 Postmenopausal    ??? Thoracic compression fracture (HCC)     T10 from a fall   ??? Wears partial dentures     upper 1 tooth on right     Past Surgical History:   Procedure Laterality Date   ??? ARTERY BIOPSY Right 12-24-12    Temporal Artery biopsy right    ??? BACK SURGERY      lumbar area   ??? COLONOSCOPY  [id number]    dr Awanda Mink   3 years   ??? COLONOSCOPY N/A 07/09/2017    COLONOSCOPY POLYPECTOMY SNARE performed by Orvan Seen, MD at [place]   ??? COLONOSCOPY N/A 07/09/2017    COLONOSCOPY WITH BIOPSY performed by Orvan Seen, MD at [place]   ??? ESOPHAGEAL MOTILITY STUDY N/A 08/27/2017    ESOPHAGEAL MANOMETRY performed by Orvan Seen, MD at [place]   ??? FIXATION  KYPHOPLASTY      t10   ??? GASTRIC FUNDOPLICATION N/A 08/24/1476    LAPAROSCOPIC PARAESOPHAGEAL HERNIA REPAIR WITH  NISSEN FUNDOPLICATION performed by Delton Coombes, MD at [place]   ??? HIP FRACTURE SURGERY Right 11/22/2017    RIGHT HIP GAMMA NAILING performed by Arville Lime, MD at [place]   ??? HIP SURGERY Right 11/22/2017    right hip gamma nailing   ??? ORIF DISTAL RADIUS FRACTURE Right 11/22/2017    OPEN REDUCTION INTERNAL FIXATION RIGHT RADIUS WITH C-ARM performed by Vevelyn Francois, MD at [place]   ??? SHOULDER SURGERY      left humerus fx 3 places   ??? UPPER GASTROINTESTINAL ENDOSCOPY  08/26/2014    dr Shanon Brow hess:negative   ??? UPPER GASTROINTESTINAL ENDOSCOPY N/A 08/08/2017    EGD ESOPHAGOGASTRODUODENOSCOPY performed by Orvan Seen, MD at [place]   ??? UPPER GASTROINTESTINAL ENDOSCOPY N/A 01/08/2018    EGD BIOPSY performed by Orvan Seen, MD at [place]   ??? WRIST FRACTURE SURGERY Right 11/22/2017     Social History     Tobacco Use   ??? Smoking status: Former Smoker     Packs/day: 0.00     Years: 40.00     Pack years: 0.00     Last attempt to quit: 08/06/1997     Years since quitting: 21.0   ??? Smokeless tobacco: Never Used   ??? Tobacco comment: rarely now smokes   Substance Use Topics   ??? Alcohol use: Yes     Alcohol/week: 0.0 standard drinks     Comment: rarely, socially   ??? Drug use: No     Family History   Problem Relation Age of Onset   ??? Migraines Other    ??? Diabetes Other    ??? Cancer Mother    ??? Breast Cancer Mother    ??? Cancer Father         bone Ca          Review of Systems    Objective:   Physical Exam      Assessment/Plan     cervcal radiculopathy:  Multiple levels  Plan :  Neurontin 100mg  try tid for now and she sees Dr Carmel [place] next week        Angelica Pou, MD

## 2018-08-05 MED ORDER — CYCLOBENZAPRINE HCL 10 MG PO TABS
10 MG | ORAL_TABLET | ORAL | 0 refills | Status: AC
Start: 2018-08-05 — End: ?

## 2018-08-15 ENCOUNTER — Inpatient Hospital Stay: Payer: MEDICARE

## 2018-08-15 ENCOUNTER — Ambulatory Visit: Admit: 2018-08-15 | Payer: MEDICARE | Primary: Internal Medicine

## 2018-08-15 MED ORDER — IOPAMIDOL 61 % IV SOLN
61 % | Freq: Once | INTRAVENOUS | Status: AC | PRN
Start: 2018-08-15 — End: 2018-08-15
  Administered 2018-08-15: 14:00:00 1

## 2018-08-15 MED ORDER — METHYLPREDNISOLONE ACETATE 80 MG/ML IJ SUSP
80 | INTRAMUSCULAR | Status: AC
Start: 2018-08-15 — End: 2018-08-15

## 2018-08-15 MED ORDER — IOPAMIDOL 61 % IJ SOLN
61 | INTRAMUSCULAR | Status: AC
Start: 2018-08-15 — End: 2018-08-15

## 2018-08-15 MED ORDER — METHYLPREDNISOLONE ACETATE 80 MG/ML IJ SUSP
80 MG/ML | Freq: Once | INTRAMUSCULAR | Status: AC | PRN
Start: 2018-08-15 — End: 2018-08-15
  Administered 2018-08-15: 14:00:00 80 via INTRAMUSCULAR

## 2018-08-15 MED FILL — ISOVUE-M 300 61 % IJ SOLN: 61 % | INTRAMUSCULAR | Qty: 15

## 2018-08-15 MED FILL — DEPO-MEDROL 80 MG/ML IJ SUSP: 80 mg/mL | INTRAMUSCULAR | Qty: 1

## 2018-08-15 NOTE — Progress Notes (Signed)
Discharge instructions discussed.  Understanding verbalized. Patient and responsible adult verbalized understanding of discharge instructions,  medication, and potential complications including pain. Patient instructed to call Doctor if complications occur.

## 2018-08-15 NOTE — Discharge Instructions (Signed)
[facility]   [address] [place]   [place], [place] [address]   [phone]     Patient:  Linda [name], Linda [name]  Date of Birth:  April 26, 1946  Medical Record #:  [id number]   Place: [facility]  Date:  08/15/2018   Physician:  Rosanne Gutting, MD    Procedure Performed: Procedure(s):  CERVICAL EPIDURAL STEROID INJECTION LEFT C7-T1     Discharge Instructions    Notify Pain Management Services if any of the following occur:     Redness/Swelling at the injection site lasting longer than 2 days   Fever (with redness, swelling, or drainage at the injection site)   Drainage at the injection site   New weakness/ numbness   Severe headache    Loss of bowel/ bladder function    General Instructions:     You may experience numbness for several hours following your treatment.     You should be cautious using those areas which are numb.     Once the numbness wears off, you may apply ice or heat to injection site, if needed.     Do not return to work or drive today     Rest today and return to normal activities tomorrow.     On average, the steroid takes about 1 week to work and can be up to 2 weeks     You should continue to depend on your primary physician for your medical management of conditions not related to your pain management treatment.     Continue to take all your other medications, including blood thinners, as directed by your primary physician unless otherwise instructed. NO changes have been made to your medications. Any changes listed on the discharge are based on patient self reporting. Any EMR warnings regarding drug/drug interactions were dismissed based on current use by the patient, management by prescribing physician and pharmacist and not managed by this physician.     Any problem which relates specifically to a treatment or procedure performed today should be directed to the [facility].       [facility]  Division of  Interventional Pain Management  [address], [place], [place] [address]  [phone]

## 2018-08-15 NOTE — H&P (Signed)
Patient:  Linda [name], Linda [name]  Date of Birth:  26-Dec-1945  Medical Record #:  [id number]   Place: [facility]  Date:  08/15/2018   Physician:  Rosanne Gutting, MD    History Obtained From: electronic medical record    HISTORY OF PRESENT ILLNESS    Past Medical History:        Diagnosis Date   ??? Bisphosphonate-associated osteonecrosis of the jaw [facility])    ??? Celiac artery atherosclerosis 2015    60% proximal stenosis   ??? Colon polyps     adenomatous    ??? Dyspepsia     nonulcerative   ??? Gastric ulcer    ??? Graves disease    ??? Hiatal hernia    ??? High cholesterol    ??? History of depression    ??? Hypertension     not a current issue   ??? Hypothyroidism due to Hashimoto's thyroiditis 2019   ??? 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 Migraine     recurrent, controlled with elavil   ??? Osteoarthritis of lumbar spine     Chunduri   ??? 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 Postmenopausal    ??? Thoracic compression fracture (HCC)     T10 from a fall   ??? Wears partial dentures     upper 1 tooth on right     Past Surgical History:        Procedure Laterality Date   ??? ARTERY BIOPSY Right 12-24-12    Temporal Artery biopsy right    ??? BACK SURGERY      lumbar area   ??? COLONOSCOPY  [id number]    dr Awanda Mink   3 years   ??? COLONOSCOPY N/A 07/09/2017    COLONOSCOPY POLYPECTOMY SNARE performed by Orvan Seen, MD at [place]   ??? COLONOSCOPY N/A 07/09/2017    COLONOSCOPY WITH BIOPSY performed by Orvan Seen, MD at [place]   ??? ESOPHAGEAL MOTILITY STUDY N/A 08/27/2017    ESOPHAGEAL MANOMETRY performed by Orvan Seen, MD at [place]   ??? FIXATION KYPHOPLASTY      t10   ??? GASTRIC FUNDOPLICATION N/A 11/02/5186    LAPAROSCOPIC PARAESOPHAGEAL HERNIA REPAIR WITH  NISSEN FUNDOPLICATION performed by Delton Coombes, MD at [place]   ??? HIP FRACTURE SURGERY Right 11/22/2017    RIGHT HIP GAMMA NAILING performed by Arville Lime, MD at [place]   ??? HIP SURGERY Right 11/22/2017    right hip gamma nailing   ??? ORIF DISTAL RADIUS FRACTURE Right 11/22/2017    OPEN REDUCTION INTERNAL FIXATION RIGHT RADIUS WITH C-ARM performed by Vevelyn Francois, MD at [place]   ??? SHOULDER SURGERY      left humerus fx 3 places   ??? UPPER GASTROINTESTINAL ENDOSCOPY  08/26/2014    dr Shanon Brow hess:negative   ??? UPPER GASTROINTESTINAL ENDOSCOPY N/A 08/08/2017    EGD ESOPHAGOGASTRODUODENOSCOPY performed by Orvan Seen, MD at [place]   ??? UPPER GASTROINTESTINAL ENDOSCOPY N/A 01/08/2018    EGD BIOPSY performed by Orvan Seen, MD at [place]   ??? WRIST FRACTURE SURGERY Right 11/22/2017     Medications Prior to Admission:   No current facility-administered medications on file prior to encounter.      Current Outpatient Medications on File Prior to Encounter  Medication Sig Dispense Refill   ??? cyclobenzaprine (FLEXERIL) 10 MG tablet TAKE 1 TABLET BY MOUTH THREE TIMES DAILY AS NEEDED FOR MUSCLE SPASMS 30 tablet 0   ??? gabapentin (NEURONTIN) 100 MG capsule One twice a day and two at night for nerve pain 120 capsule 3   ??? mirabegron (MYRBETRIQ) 25 MG TB24 Take 1 tablet by mouth daily 30 tablet 1   ??? traZODone (DESYREL) 50 MG tablet TAKE 1 TABLET BY MOUTH EVERY NIGHT AS NEEDED FOR SLEEP 90 tablet 5   ??? omeprazole (PRILOSEC) 40 MG delayed release capsule TAKE 1 CAPSULE BY MOUTH DAILY 90 capsule 2   ??? nadolol (CORGARD) 20 MG tablet Take 10 mg by mouth nightly     ??? PARoxetine (PAXIL) 40 MG tablet Take 1 tablet by mouth daily (Patient taking differently: Take 40 mg by mouth nightly ) 90 tablet 3   ??? divalproex (DEPAKOTE ER) 500 MG extended release tablet Take 1 tablet by mouth nightly 30 tablet 3   ??? Polysaccharide Iron Complex 391.3 (180 Fe) MG CAPS One a day for six months 30 capsule 5   ??? Cobalamine Combinations (B12 FOLATE) 800-800 MCG CAPS One a day indefinitely 30 capsule 3   ??? Calcium Carbonate-Vitamin D (CALTRATE 600+D) 600-400 MG-UNIT CHEW Take 1 tablet by mouth  2 times daily. 60 tablet 11   ??? docusate sodium (COLACE, DULCOLAX) 100 MG CAPS Take 100 mg by mouth 2 times daily 60 capsule 0     Allergies:  Actonel [risedronate sodium] and Nsaids  Social History     Socioeconomic History   ??? Marital status: Married     Spouse name: Not on file   ??? Number of children: 1   ??? Years of education: Not on file   ??? Highest education level: Not on file   Occupational History   ??? Occupation: secretary/bar tender   Social Needs   ??? Financial resource strain: Not on file   ??? Food insecurity:     Worry: Not on file     Inability: Not on file   ??? Transportation needs:     Medical: Not on file     Non-medical: Not on file   Tobacco Use   ??? Smoking status: Current Some Day Smoker     Packs/day: 0.00     Years: 40.00     Pack years: 0.00     Last attempt to quit: 08/06/1997     Years since quitting: 21.0   ??? Smokeless tobacco: Never Used   ??? Tobacco comment: rarely now smokes   Substance and Sexual Activity   ??? Alcohol use: Yes     Alcohol/week: 0.0 standard drinks     Comment: rarely, socially   ??? Drug use: No   ??? Sexual activity: Not Currently   Lifestyle   ??? Physical activity:     Days per week: Not on file     Minutes per session: Not on file   ??? Stress: Not on file   Relationships   ??? Social connections:     Talks on phone: Not on file     Gets together: Not on file     Attends religious service: Not on file     Active member of club or organization: Not on file     Attends meetings of clubs or organizations: Not on file     Relationship status: Not on file   ??? Intimate partner violence:     Fear of  current or ex partner: Not on file     Emotionally abused: Not on file     Physically abused: Not on file     Forced sexual activity: Not on file   Other Topics Concern   ??? Not on file   Social History Narrative   ??? Not on file     Family History   Problem Relation Age of Onset   ??? Migraines Other    ??? Diabetes Other    ??? Cancer Mother    ??? Breast Cancer Mother    ??? Cancer Father         bone Ca          PHYSICAL EXAM:      BP (!) 140/70    Pulse 64    Temp 97 ??F (36.1 ??C) (Temporal)    Resp 18    Ht 5\' 4"  (1.626 m)    Wt 134 lb 9.6 oz (61.1 kg)    SpO2 99%    BMI 23.10 kg/m??  I            ASSESSMENT AND PLAN:    1.  Procedure. options, risks and benefits reviewed with patient and expresses understanding.

## 2018-08-15 NOTE — Op Note (Signed)
Patient:  Linda [name], Linda [name]  Date of Birth:  [birth year]  Medical Record #:  [id number]   Place: [place]  Date:  08/15/2018   Physician:  Rosanne Gutting, MD    PRE-PROCEDURE DIAGNOSIS: M54.13    POST-PROCEDURE DIAGNOSIS: M54.13    PROCEDURE:  Midline interlaminar left C7-T1 epidural steroid injection with fluoroscopy and epidurography.    BRIEF HISTORY:  The patient presents today to [facility] for a scheduled cervical epidural steroid injection procedure.  The patient was re-evaluated today and is clinically unchanged as compared to my previous evaluation.  The patient is clinically stable to proceed with the procedure.    PROCEDURE NOTE:  The procedure was again explained to the patient and the previously distributed pre-procedure literature was reviewed.  The options, rationale, and benefits of the procedure including pain relief, functional improvement, and increased mobility, as well as the risks of the procedure including but not limited to infection, bleeding, paresthesia, pain, failure to relieve pain, increased pain, headache, allergic reaction, neurologic impairment, local anesthetic, toxicity, and side effects and the potential side effects of corticosteroids were discussed with the patient and informed written consent was obtained from the patient.      The patient was positioned in the prone position on the fluoroscopy table.  The skin overlying the cervical vertebrae was prepped using Chloraprep and draped in the usual sterile fashion.  The C7-T1 intervertebral level was identified using intermittent AP fluoroscopy.  The previously identified projection of overlying skin was anesthetized using 2 cc of buffered 1% lidocaine with a 27 gauge needle.  A 3.5" 22g Touhy needle was advanced through a small skin nick in the AP view towards the interlaminar and epidural space.  The epidural space was easily identified using loss of resistance to saline.  No difficulty, paresthesia  or occurrence of pain was encountered. Careful aspiration was negative for CSF and blood.  A total of 1 cc Isovue 300 was injected yielding an epidurogram.    FLUOROSCOPY:  A fluoroscopy unit was utilized to obtain fluoroscopic images for intra-procedural use and assistance.  Fluoroscopy was utilized to identify anatomic and radiographic landmarks for the accompanying procedure guidance and not for diagnostic purposes.      After negative aspiration 4 cc of therapeutic injectate containing 1 cc of Depo-Medrol 80 mg/cc and 3 ml of saline was injected slowly in aliquots while clinically observing and monitoring the patient with negative aspiration demonstrated between aliquots of injections.  At this time no paresthesia or occurrence of pain was present.    The needle was then removed, the area was cleansed and a Band-Aid was placed over the injection site.  There were no complications.The patient tolerated the procedure well. The procedure was performed using local anesthesia.     The patient was transferred by wheelchair with accompaniment to the  Recovery Area and was monitored per protocol.  The vital signs remained stable.  There were no sensory or motor blockades in the lower extremities and the patient was discharged in stable condition accompanied by an escort with written instructions after fulfilling the standard discharge criteria.  Written follow up instructions were given to the patient and are as follows:    Estimated Blood Loss: 27ml    Plan:  Follow up in 4-6 weeks    Office: [phone]

## 2018-10-03 ENCOUNTER — Ambulatory Visit: Admit: 2018-10-03 | Payer: MEDICARE | Primary: Internal Medicine

## 2018-10-03 ENCOUNTER — Inpatient Hospital Stay: Payer: MEDICARE

## 2018-10-03 MED ORDER — BUPIVACAINE HCL (PF) 0.5 % IJ SOLN
0.5 % | Freq: Once | INTRAMUSCULAR | Status: AC | PRN
Start: 2018-10-03 — End: 2018-10-03
  Administered 2018-10-03: 14:00:00 5 via EPIDURAL

## 2018-10-03 MED ORDER — METHYLPREDNISOLONE ACETATE 80 MG/ML IJ SUSP
80 MG/ML | Freq: Once | INTRAMUSCULAR | Status: AC | PRN
Start: 2018-10-03 — End: 2018-10-03
  Administered 2018-10-03: 14:00:00 40 via EPIDURAL

## 2018-10-03 MED ORDER — LIDOCAINE HCL (PF) 1 % IJ SOLN
1 % | Freq: Once | INTRAMUSCULAR | Status: AC | PRN
Start: 2018-10-03 — End: 2018-10-03
  Administered 2018-10-03: 14:00:00 2 via EPIDURAL

## 2018-10-03 NOTE — Progress Notes (Signed)
Patient verbalized understanding of discharge instructions, medications given, and potential complications including pain. Patient instructed to call Doctor if complications occur.

## 2018-10-03 NOTE — H&P (Signed)
Patient:  Linda [name], Linda [name]  Date of Birth:  [birth year]  Medical Record #:  [id number]   Place: [place]  Date:  10/03/2018   Physician:  Rosanne Gutting, MD    History Obtained From: electronic medical record    HISTORY OF PRESENT ILLNESS    Past Medical History:        Diagnosis Date   ??? 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 Celiac artery atherosclerosis 2015    60% proximal stenosis   ??? Colon polyps     adenomatous    ??? Dyspepsia     nonulcerative   ??? Gastric ulcer    ??? Graves disease    ??? Hiatal hernia    ??? High cholesterol    ??? History of depression    ??? Hypertension     not a current issue   ??? Hypothyroidism due to Hashimoto's thyroiditis 2019   ??? 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 Migraine     recurrent, controlled with elavil   ??? Osteoarthritis of lumbar spine     [place]   ??? 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 Postmenopausal    ??? Thoracic compression fracture (HCC)     T10 from a fall   ??? Wears partial dentures     upper 1 tooth on right     Past Surgical History:        Procedure Laterality Date   ??? ARTERY BIOPSY Right 12-24-12    Temporal Artery biopsy right    ??? BACK SURGERY      lumbar area   ??? COLONOSCOPY  [id number]    dr Awanda Mink   3 years   ??? COLONOSCOPY N/A 07/09/2017    COLONOSCOPY POLYPECTOMY SNARE performed by Orvan Seen, MD at [place]   ??? COLONOSCOPY N/A 07/09/2017    COLONOSCOPY WITH BIOPSY performed by Orvan Seen, MD at [place]   ??? ESOPHAGEAL MOTILITY STUDY N/A 08/27/2017    ESOPHAGEAL MANOMETRY performed by Orvan Seen, MD at [place]   ??? FIXATION KYPHOPLASTY      t10   ??? GASTRIC FUNDOPLICATION N/A 3/32/9518    LAPAROSCOPIC PARAESOPHAGEAL HERNIA REPAIR WITH  NISSEN FUNDOPLICATION performed by Delton Coombes, MD at [place]   ??? HIP FRACTURE SURGERY Right 11/22/2017    RIGHT HIP GAMMA NAILING performed by Arville Lime, MD at [place]   ??? HIP SURGERY Right 11/22/2017    right hip gamma nailing   ??? ORIF DISTAL RADIUS FRACTURE Right 11/22/2017    OPEN REDUCTION INTERNAL FIXATION RIGHT RADIUS WITH C-ARM performed by Vevelyn Francois, MD at [place]   ??? PAIN MANAGEMENT PROCEDURE Left 08/15/2018    CERVICAL EPIDURAL STEROID INJECTION LEFT C7-T1 performed by Acey Lav, MD at [place]   ??? SHOULDER SURGERY      left humerus fx 3 places   ??? UPPER GASTROINTESTINAL ENDOSCOPY  08/26/2014    dr Shanon Brow hess:negative   ??? UPPER GASTROINTESTINAL ENDOSCOPY N/A 08/08/2017    EGD ESOPHAGOGASTRODUODENOSCOPY performed by Orvan Seen, MD at [place]   ??? UPPER GASTROINTESTINAL ENDOSCOPY N/A 01/08/2018    EGD BIOPSY performed by Orvan Seen, MD at [place]   ??? WRIST FRACTURE SURGERY Right 11/22/2017     Medications Prior  to Admission:   No current facility-administered medications on file prior to encounter.      Current Outpatient Medications on File Prior to Encounter   Medication Sig Dispense Refill   ??? cyclobenzaprine (FLEXERIL) 10 MG tablet TAKE 1 TABLET BY MOUTH THREE TIMES DAILY AS NEEDED FOR MUSCLE SPASMS 30 tablet 0   ??? traZODone (DESYREL) 50 MG tablet TAKE 1 TABLET BY MOUTH EVERY NIGHT AS NEEDED FOR SLEEP 90 tablet 5   ??? omeprazole (PRILOSEC) 40 MG delayed release capsule TAKE 1 CAPSULE BY MOUTH DAILY 90 capsule 2   ??? docusate sodium (COLACE, DULCOLAX) 100 MG CAPS Take 100 mg by mouth 2 times daily 60 capsule 0   ??? nadolol (CORGARD) 20 MG tablet Take 10 mg by mouth nightly     ??? PARoxetine (PAXIL) 40 MG tablet Take 1 tablet by mouth daily (Patient taking differently: Take 40 mg by mouth nightly ) 90 tablet 3   ??? divalproex (DEPAKOTE ER) 500 MG extended release tablet Take 1 tablet by mouth nightly 30 tablet 3   ??? Polysaccharide Iron Complex 391.3 (180 Fe) MG CAPS One a day for six months 30 capsule 5   ??? Cobalamine Combinations (B12 FOLATE) 800-800 MCG CAPS One a day indefinitely 30 capsule 3   ??? Calcium  Carbonate-Vitamin D (CALTRATE 600+D) 600-400 MG-UNIT CHEW Take 1 tablet by mouth 2 times daily. 60 tablet 11   ??? gabapentin (NEURONTIN) 100 MG capsule One twice a day and two at night for nerve pain 120 capsule 3   ??? mirabegron (MYRBETRIQ) 25 MG TB24 Take 1 tablet by mouth daily 30 tablet 1     Allergies:  Actonel [risedronate sodium] and Nsaids  Social History     Socioeconomic History   ??? Marital status: Married     Spouse name: Not on file   ??? Number of children: 1   ??? Years of education: Not on file   ??? Highest education level: Not on file   Occupational History   ??? Occupation: secretary/bar tender   Social Needs   ??? Financial resource strain: Not on file   ??? Food insecurity:     Worry: Not on file     Inability: Not on file   ??? Transportation needs:     Medical: Not on file     Non-medical: Not on file   Tobacco Use   ??? Smoking status: Current Some Day Smoker     Packs/day: 0.00     Years: 40.00     Pack years: 0.00     Last attempt to quit: 08/06/1997     Years since quitting: 21.1   ??? Smokeless tobacco: Never Used   ??? Tobacco comment: rarely now smokes   Substance and Sexual Activity   ??? Alcohol use: Yes     Alcohol/week: 0.0 standard drinks     Comment: rarely, socially   ??? Drug use: No   ??? Sexual activity: Not Currently   Lifestyle   ??? Physical activity:     Days per week: Not on file     Minutes per session: Not on file   ??? Stress: Not on file   Relationships   ??? Social connections:     Talks on phone: Not on file     Gets together: Not on file     Attends religious service: Not on file     Active member of club or organization: Not on file     Attends meetings  of clubs or organizations: Not on file     Relationship status: Not on file   ??? Intimate partner violence:     Fear of current or ex partner: Not on file     Emotionally abused: Not on file     Physically abused: Not on file     Forced sexual activity: Not on file   Other Topics Concern   ??? Not on file   Social History Narrative   ??? Not on file      Family History   Problem Relation Age of Onset   ??? Migraines Other    ??? Diabetes Other    ??? Cancer Mother    ??? Breast Cancer Mother    ??? Cancer Father         bone Ca         PHYSICAL EXAM:      BP (!) 115/90    Pulse 71    Temp 97.7 ??F (36.5 ??C) (Temporal)    Resp 16    Ht 5\' 4"  (1.626 m)    Wt 137 lb 2 oz (62.2 kg)    SpO2 95%    BMI 23.54 kg/m??  I            ASSESSMENT AND PLAN:    1.  Procedure. options, risks and benefits reviewed with patient and expresses understanding.

## 2018-10-03 NOTE — Op Note (Signed)
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  The patient is clinically unchanged as compared to my previous evaluation. The patient is clinically stable to proceed with the above scheduled injection.    The options, rationale and benefits were discussed as were the risks including but not limited to infection, bleeding, pain, failure to relieve pain and pneumothorax.  Informed written consent was obtained.      PROCEDURE NOTE:  Patient was placed in the prone position.  The left posterior shoulder area was prepped with Choraprep and draped in the usual sterile fashion.  Using intermittent fluoroscopic guidance a 25 gauge 3 1/2 inch needle was placed in the supraspinatus fossa between the suprascapular notch and scapular spine until periosteum was contacted.  Periosteum was contacted. Negative aspiration was demonstrated and 10 cc of therapeutic injectate was injected and the needle was removed. The therapeutic injectate contained 9 cc of  bupivacaine 0.25 % and 1 cc of Depo-Medrol 40 mg per cc. The injectate was given in small aliquots with negative aspiration between aliquots.    The needle was removed.  The area was cleansed.  A Band-Aid was placed over the injection site.  The patient tolerated the procedure well and had no difficulty.  The patient was transported by wheelchair with accompaniment to the recovery area and was discharged following standard discharge criteria with the following  plan:    Estimated Blood Loss: 67ml      Plan:  Follow up in 6-8 weeks    Office: [phone]

## 2018-10-03 NOTE — Discharge Instructions (Signed)
[facility]   [address] [place], [place]   [place], [place] [address]   [phone]     Patient:  Linda [name], Linda [name]  Date of Birth:  01-27-1946  Medical Record #:  [id number]   Place: [place]  Date:  10/03/2018   Physician:  Rosanne Gutting, MD    Procedure Performed: Procedure(s):  SUPRASCAPULAR NERVE BLOCK LEFT SHOULDER     Discharge Instructions    Notify Pain Management Services if any of the following occur:     Redness/Swelling at the injection site lasting longer than 2 days   Fever (with redness, swelling, or drainage at the injection site)   Drainage at the injection site   New weakness/ numbness   Severe headache    Loss of bowel/ bladder function    General Instructions:     You may experience numbness for several hours following your treatment.     You should be cautious using those areas which are numb.     Once the numbness wears off, you may apply ice or heat to injection site, if needed.     Do not return to work or drive today     Rest today and return to normal activities tomorrow.     On average, the steroid takes about 1 week to work and can be up to 2 weeks     You should continue to depend on your primary physician for your medical management of conditions not related to your pain management treatment.     Continue to take all your other medications, including blood thinners, as directed by your primary physician unless otherwise instructed. NO changes have been made to your medications. Any changes listed on the discharge are based on patient self reporting. Any EMR warnings regarding drug/drug interactions were dismissed based on current use by the patient, management by prescribing physician and pharmacist and not managed by this physician.     Any problem which relates specifically to a treatment or procedure performed today should be directed to the [facility].       [facility]  Division of  Interventional Pain Management  [address], [place], [place] [address]  [phone]

## 2018-10-17 ENCOUNTER — Ambulatory Visit: Admit: 2018-10-17 | Payer: MEDICARE | Primary: Internal Medicine

## 2018-10-17 ENCOUNTER — Inpatient Hospital Stay: Payer: MEDICARE

## 2018-10-17 MED ORDER — METHYLPREDNISOLONE ACETATE 80 MG/ML IJ SUSP
80 MG/ML | Freq: Once | INTRAMUSCULAR | Status: AC | PRN
Start: 2018-10-17 — End: 2018-10-17
  Administered 2018-10-17: 14:00:00 80 via EPIDURAL

## 2018-10-17 MED ORDER — IOPAMIDOL 61 % IV SOLN
61 % | Freq: Once | INTRAVENOUS | Status: AC | PRN
Start: 2018-10-17 — End: 2018-10-17
  Administered 2018-10-17: 14:00:00 2

## 2018-10-17 NOTE — Progress Notes (Signed)
Patient verbalized understanding of discharge instructions, medications given, and potential complications including pain. Patient instructed to call Doctor if complications occur.

## 2018-10-17 NOTE — Op Note (Signed)
Patient:  Linda [name], Linda [name]  Date of Birth:  [birth year]  Medical Record #:  [id number]   Place: [place]  Date:  10/17/2018   Physician:  Rosanne Gutting, MD    PRE-PROCEDURE DIAGNOSIS: M54.13    POST-PROCEDURE DIAGNOSIS: M54.13    PROCEDURE:  Midline interlaminar left C7-T1 epidural steroid injection with fluoroscopy and epidurography.    BRIEF HISTORY:  The patient presents today to [facility] for a scheduled cervical epidural steroid injection procedure.  The patient was re-evaluated today and is clinically unchanged as compared to my previous evaluation.  The patient is clinically stable to proceed with the procedure.    PROCEDURE NOTE:  The procedure was again explained to the patient and the previously distributed pre-procedure literature was reviewed.  The options, rationale, and benefits of the procedure including pain relief, functional improvement, and increased mobility, as well as the risks of the procedure including but not limited to infection, bleeding, paresthesia, pain, failure to relieve pain, increased pain, headache, allergic reaction, neurologic impairment, local anesthetic, toxicity, and side effects and the potential side effects of corticosteroids were discussed with the patient and informed written consent was obtained from the patient.      The patient was positioned in the prone position on the fluoroscopy table.  The skin overlying the cervical vertebrae was prepped using Chloraprep and draped in the usual sterile fashion.  The C7-T1 intervertebral level was identified using intermittent AP fluoroscopy.  The previously identified projection of overlying skin was anesthetized using 2 cc of buffered 1% lidocaine with a 27 gauge needle.  A 3.5" 22g Touhy needle was advanced through a small skin nick in the AP view towards the interlaminar and epidural space.  The epidural space was easily identified using loss of resistance to saline.  No difficulty, paresthesia  or occurrence of pain was encountered. Careful aspiration was negative for CSF and blood.  A total of 1 cc Isovue 300 was injected yielding an epidurogram.    FLUOROSCOPY:  A fluoroscopy unit was utilized to obtain fluoroscopic images for intra-procedural use and assistance.  Fluoroscopy was utilized to identify anatomic and radiographic landmarks for the accompanying procedure guidance and not for diagnostic purposes.      After negative aspiration 4 cc of therapeutic injectate containing 1 cc of Depo-Medrol 80 mg/cc and 3 ml of saline was injected slowly in aliquots while clinically observing and monitoring the patient with negative aspiration demonstrated between aliquots of injections.  At this time no paresthesia or occurrence of pain was present.    The needle was then removed, the area was cleansed and a Band-Aid was placed over the injection site.  There were no complications.The patient tolerated the procedure well. The procedure was performed using local anesthesia.     The patient was transferred by wheelchair with accompaniment to the  Recovery Area and was monitored per protocol.  The vital signs remained stable.  There were no sensory or motor blockades in the lower extremities and the patient was discharged in stable condition accompanied by an escort with written instructions after fulfilling the standard discharge criteria.  Written follow up instructions were given to the patient and are as follows:    Estimated Blood Loss: 3ml    Plan:  Follow up in 6-8 weeks    Office: [phone]

## 2018-10-17 NOTE — H&P (Signed)
Patient:  Linda [name], Linda [name]  Date of Birth:  [birth year]  Medical Record #:  [id number]   Place: [name]  Date:  10/17/2018   Physician:  Rosanne Gutting, MD    History Obtained From: electronic medical record    HISTORY OF PRESENT ILLNESS    Past Medical History:        Diagnosis Date   ??? Bisphosphonate-associated osteonecrosis of the jaw [facility])    ??? Celiac artery atherosclerosis 2015    60% proximal stenosis   ??? Colon polyps     adenomatous    ??? Dyspepsia     nonulcerative   ??? Gastric ulcer    ??? Graves disease    ??? Hiatal hernia    ??? High cholesterol    ??? History of depression    ??? Hypertension     not a current issue   ??? Hypothyroidism due to Hashimoto's thyroiditis 2019   ??? 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 Migraine     recurrent, controlled with elavil   ??? Osteoarthritis of lumbar spine     [place]   ??? 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 Postmenopausal    ??? Thoracic compression fracture (HCC)     T10 from a fall   ??? Wears partial dentures     upper 1 tooth on right     Past Surgical History:        Procedure Laterality Date   ??? ANESTHESIA NERVE BLOCK Left 10/03/2018    SUPRASCAPULAR NERVE BLOCK LEFT SHOULDER performed by Acey Lav, MD at [place]   ??? ARTERY BIOPSY Right 12-24-12    Temporal Artery biopsy right    ??? BACK SURGERY      lumbar area   ??? COLONOSCOPY  [id number]    dr Awanda Mink   3 years   ??? COLONOSCOPY N/A 07/09/2017    COLONOSCOPY POLYPECTOMY SNARE performed by Orvan Seen, MD at [place]   ??? COLONOSCOPY N/A 07/09/2017    COLONOSCOPY WITH BIOPSY performed by Orvan Seen, MD at [place]   ??? ESOPHAGEAL MOTILITY STUDY N/A 08/27/2017    ESOPHAGEAL MANOMETRY performed by Orvan Seen, MD at [place]   ??? FIXATION KYPHOPLASTY      t10   ??? GASTRIC FUNDOPLICATION N/A 7/42/5956    LAPAROSCOPIC PARAESOPHAGEAL HERNIA REPAIR WITH  NISSEN  FUNDOPLICATION performed by Delton Coombes, MD at [place]   ??? HIP FRACTURE SURGERY Right 11/22/2017    RIGHT HIP GAMMA NAILING performed by Arville Lime, MD at [place]   ??? HIP SURGERY Right 11/22/2017    right hip gamma nailing   ??? ORIF DISTAL RADIUS FRACTURE Right 11/22/2017    OPEN REDUCTION INTERNAL FIXATION RIGHT RADIUS WITH C-ARM performed by Vevelyn Francois, MD at [place]   ??? PAIN MANAGEMENT PROCEDURE Left 08/15/2018    CERVICAL EPIDURAL STEROID INJECTION LEFT C7-T1 performed by Acey Lav, MD at [place]   ??? SHOULDER SURGERY      left humerus fx 3 places   ??? UPPER GASTROINTESTINAL ENDOSCOPY  08/26/2014    dr Shanon Brow hess:negative   ??? UPPER GASTROINTESTINAL ENDOSCOPY N/A 08/08/2017    EGD ESOPHAGOGASTRODUODENOSCOPY performed by Orvan Seen, MD at [place]   ??? UPPER GASTROINTESTINAL ENDOSCOPY N/A 01/08/2018  EGD BIOPSY performed by Orvan Seen, MD at [place]   ??? WRIST FRACTURE SURGERY Right 11/22/2017     Medications Prior to Admission:   No current facility-administered medications on file prior to encounter.      Current Outpatient Medications on File Prior to Encounter   Medication Sig Dispense Refill   ??? cyclobenzaprine (FLEXERIL) 10 MG tablet TAKE 1 TABLET BY MOUTH THREE TIMES DAILY AS NEEDED FOR MUSCLE SPASMS 30 tablet 0   ??? gabapentin (NEURONTIN) 100 MG capsule One twice a day and two at night for nerve pain 120 capsule 3   ??? mirabegron (MYRBETRIQ) 25 MG TB24 Take 1 tablet by mouth daily 30 tablet 1   ??? traZODone (DESYREL) 50 MG tablet TAKE 1 TABLET BY MOUTH EVERY NIGHT AS NEEDED FOR SLEEP 90 tablet 5   ??? omeprazole (PRILOSEC) 40 MG delayed release capsule TAKE 1 CAPSULE BY MOUTH DAILY 90 capsule 2   ??? docusate sodium (COLACE, DULCOLAX) 100 MG CAPS Take 100 mg by mouth 2 times daily 60 capsule 0   ??? nadolol (CORGARD) 20 MG tablet Take 10 mg by mouth nightly     ??? PARoxetine (PAXIL) 40 MG tablet Take 1 tablet by mouth daily (Patient taking differently: Take 40 mg by  mouth nightly ) 90 tablet 3   ??? divalproex (DEPAKOTE ER) 500 MG extended release tablet Take 1 tablet by mouth nightly 30 tablet 3   ??? Polysaccharide Iron Complex 391.3 (180 Fe) MG CAPS One a day for six months 30 capsule 5   ??? Cobalamine Combinations (B12 FOLATE) 800-800 MCG CAPS One a day indefinitely 30 capsule 3   ??? Calcium Carbonate-Vitamin D (CALTRATE 600+D) 600-400 MG-UNIT CHEW Take 1 tablet by mouth 2 times daily. 60 tablet 11     Allergies:  Actonel [risedronate sodium] and Nsaids  Social History     Socioeconomic History   ??? Marital status: Married     Spouse name: Not on file   ??? Number of children: 1   ??? Years of education: Not on file   ??? Highest education level: Not on file   Occupational History   ??? Occupation: secretary/bar tender   Social Needs   ??? Financial resource strain: Not on file   ??? Food insecurity     Worry: Not on file     Inability: Not on file   ??? Transportation needs     Medical: Not on file     Non-medical: Not on file   Tobacco Use   ??? Smoking status: Current Some Day Smoker     Packs/day: 0.00     Years: 40.00     Pack years: 0.00     Last attempt to quit: 08/06/1997     Years since quitting: 21.2   ??? Smokeless tobacco: Never Used   ??? Tobacco comment: rarely now smokes   Substance and Sexual Activity   ??? Alcohol use: Yes     Alcohol/week: 0.0 standard drinks     Comment: rarely, socially   ??? Drug use: No   ??? Sexual activity: Not Currently   Lifestyle   ??? Physical activity     Days per week: Not on file     Minutes per session: Not on file   ??? Stress: Not on file   Relationships   ??? Social Product manager on phone: Not on file     Gets together: Not on file  Attends religious service: Not on file     Active member of club or organization: Not on file     Attends meetings of clubs or organizations: Not on file     Relationship status: Not on file   ??? Intimate partner violence     Fear of current or ex partner: Not on file     Emotionally abused: Not on file     Physically  abused: Not on file     Forced sexual activity: Not on file   Other Topics Concern   ??? Not on file   Social History Narrative   ??? Not on file     Family History   Problem Relation Age of Onset   ??? Migraines Other    ??? Diabetes Other    ??? Cancer Mother    ??? Breast Cancer Mother    ??? Cancer Father         bone Ca         PHYSICAL EXAM:      Ht 5\' 4"  (1.626 m)    Wt 135 lb (61.2 kg)    BMI 23.17 kg/m??  I            ASSESSMENT AND PLAN:    1.  Procedure. options, risks and benefits reviewed with patient and expresses understanding.

## 2018-10-17 NOTE — Discharge Instructions (Signed)
[facility]   [address] [place], [place]   [place], [place] [address]   [phone]     Patient:  Linda [name], Linda [name]  Date of Birth:  11/23/1945  Medical Record #:  [id number]   Place: [place]  Date:  10/17/2018   Physician:  Rosanne Gutting, MD    Procedure Performed: Procedure(s):  CERVICAL EPIDURAL STEROID INJECTION LEFT C7-T1     Discharge Instructions    Notify Pain Management Services if any of the following occur:     Redness/Swelling at the injection site lasting longer than 2 days   Fever (with redness, swelling, or drainage at the injection site)   Drainage at the injection site   New weakness/ numbness   Severe headache    Loss of bowel/ bladder function    General Instructions:     You may experience numbness for several hours following your treatment.     You should be cautious using those areas which are numb.     Once the numbness wears off, you may apply ice or heat to injection site, if needed.     Do not return to work or drive today     Rest today and return to normal activities tomorrow.     On average, the steroid takes about 1 week to work and can be up to 2 weeks     You should continue to depend on your primary physician for your medical management of conditions not related to your pain management treatment.     Continue to take all your other medications, including blood thinners, as directed by your primary physician unless otherwise instructed. NO changes have been made to your medications. Any changes listed on the discharge are based on patient self reporting. Any EMR warnings regarding drug/drug interactions were dismissed based on current use by the patient, management by prescribing physician and pharmacist and not managed by this physician.     Any problem which relates specifically to a treatment or procedure performed today should be directed to the [facility].       [facility]  Division of  Interventional Pain Management  [address] [place], [place] [address]  [phone]

## 2018-10-27 ENCOUNTER — Telehealth

## 2018-10-27 ENCOUNTER — Encounter

## 2018-10-27 MED ORDER — HYDROXYZINE HCL 25 MG PO TABS
25 MG | ORAL_TABLET | Freq: Four times a day (QID) | ORAL | 0 refills | Status: DC | PRN
Start: 2018-10-27 — End: 2018-11-04

## 2018-10-27 MED ORDER — OMEPRAZOLE 40 MG PO CPDR
40 MG | ORAL_CAPSULE | Freq: Every day | ORAL | 2 refills | Status: DC
Start: 2018-10-27 — End: 2018-11-04

## 2018-10-27 MED ORDER — TRAZODONE HCL 50 MG PO TABS
50 MG | ORAL_TABLET | ORAL | 5 refills | Status: DC
Start: 2018-10-27 — End: 2018-11-04

## 2018-10-27 MED ORDER — PAROXETINE HCL 40 MG PO TABS
40 MG | ORAL_TABLET | Freq: Every evening | ORAL | 3 refills | Status: DC
Start: 2018-10-27 — End: 2018-11-04

## 2018-10-27 MED ORDER — BACLOFEN 10 MG PO TABS
10 MG | ORAL_TABLET | Freq: Every evening | ORAL | 0 refills | Status: DC | PRN
Start: 2018-10-27 — End: 2018-11-04

## 2018-10-27 MED ORDER — OXYBUTYNIN CHLORIDE ER 10 MG PO TB24
10 MG | ORAL_TABLET | Freq: Every day | ORAL | 0 refills | Status: DC
Start: 2018-10-27 — End: 2018-11-04

## 2018-10-27 MED ORDER — DIVALPROEX SODIUM ER 500 MG PO TB24
500 MG | ORAL_TABLET | Freq: Every evening | ORAL | 3 refills | Status: DC
Start: 2018-10-27 — End: 2018-11-04

## 2018-10-27 NOTE — Telephone Encounter (Signed)
Yes go through pharmacist or connie put in refills she needs  Good luck I will miss you

## 2018-10-27 NOTE — Telephone Encounter (Signed)
Patient cancelled appointment with Dr Garen Grams for this week.  Patient will be moving to [place] this Saturday and is asking to be able to get new prescriptions for her medications    Linda [name] [phone]    Please Advise

## 2018-10-27 NOTE — Telephone Encounter (Signed)
lmom with pt to call to verify pharmacy

## 2018-10-28 ENCOUNTER — Encounter: Payer: MEDICARE | Attending: Internal Medicine | Primary: Internal Medicine

## 2018-11-04 MED ORDER — DIVALPROEX SODIUM ER 500 MG PO TB24
500 MG | ORAL_TABLET | Freq: Every evening | ORAL | 3 refills | Status: DC
Start: 2018-11-04 — End: 2018-11-04

## 2018-11-04 MED ORDER — DIVALPROEX SODIUM ER 500 MG PO TB24
500 MG | ORAL_TABLET | ORAL | 0 refills | Status: DC
Start: 2018-11-04 — End: 2019-04-27

## 2018-11-04 MED ORDER — HYDROXYZINE HCL 25 MG PO TABS
25 MG | ORAL_TABLET | Freq: Four times a day (QID) | ORAL | 0 refills | Status: AC | PRN
Start: 2018-11-04 — End: 2018-12-04

## 2018-11-04 MED ORDER — BACLOFEN 10 MG PO TABS
10 MG | ORAL_TABLET | Freq: Every evening | ORAL | 0 refills | Status: DC | PRN
Start: 2018-11-04 — End: 2019-04-27

## 2018-11-04 MED ORDER — PAROXETINE HCL 40 MG PO TABS
40 MG | ORAL_TABLET | Freq: Every evening | ORAL | 3 refills | Status: DC
Start: 2018-11-04 — End: 2019-04-27

## 2018-11-04 MED ORDER — TRAZODONE HCL 50 MG PO TABS
50 MG | ORAL_TABLET | ORAL | 5 refills | Status: DC
Start: 2018-11-04 — End: 2019-05-07

## 2018-11-04 MED ORDER — OMEPRAZOLE 40 MG PO CPDR
40 MG | ORAL_CAPSULE | Freq: Every day | ORAL | 2 refills | Status: AC
Start: 2018-11-04 — End: ?

## 2018-11-04 MED ORDER — OXYBUTYNIN CHLORIDE ER 10 MG PO TB24
10 MG | ORAL_TABLET | Freq: Every day | ORAL | 0 refills | Status: DC
Start: 2018-11-04 — End: 2019-04-27

## 2018-11-04 NOTE — Telephone Encounter (Signed)
Resent to [facility]

## 2018-11-04 NOTE — Telephone Encounter (Signed)
Patient called said Pharmacy never got this, and it shows receipt not confirmed by pharmacy. Is it possible to resend. Please advise. thanks

## 2019-04-27 MED ORDER — GABAPENTIN 100 MG PO CAPS
100 MG | ORAL_CAPSULE | ORAL | 0 refills | Status: AC
Start: 2019-04-27 — End: 2019-05-28

## 2019-04-27 MED ORDER — BACLOFEN 10 MG PO TABS
10 MG | ORAL_TABLET | Freq: Every evening | ORAL | 0 refills | Status: AC | PRN
Start: 2019-04-27 — End: ?

## 2019-04-27 MED ORDER — OXYBUTYNIN CHLORIDE ER 10 MG PO TB24
10 MG | ORAL_TABLET | Freq: Every day | ORAL | 0 refills | Status: AC
Start: 2019-04-27 — End: ?

## 2019-04-27 MED ORDER — DIVALPROEX SODIUM ER 500 MG PO TB24
500 MG | ORAL_TABLET | ORAL | 0 refills | Status: AC
Start: 2019-04-27 — End: ?

## 2019-04-27 MED ORDER — PAROXETINE HCL 40 MG PO TABS
40 MG | ORAL_TABLET | Freq: Every evening | ORAL | 0 refills | Status: AC
Start: 2019-04-27 — End: ?

## 2019-05-07 MED ORDER — TRAZODONE HCL 50 MG PO TABS
50 MG | ORAL_TABLET | ORAL | 0 refills | Status: AC
Start: 2019-05-07 — End: ?

## 2019-05-07 NOTE — Telephone Encounter (Signed)
Requesting 90 day supply

## 2021-11-17 IMAGING — MG MAMMOGRAPHY SCREENING BILATERAL 3[PERSON_NAME]
8 series · 8 of 24 positions shown · non-contrast
Comparison: Comparison was made to prior examinations.

________________________________________________________________________________________________ 
MAMMOGRAPHY SCREENING BILATERAL 3HJ KASAH DIEEN, 11/17/2021 [DATE]: 
CLINICAL INDICATION: Bilateral screening mammogram. Previous bilateral breast 
reduction surgery.
TECHNIQUE: Digital bilateral mammograms and 3-D Tomosynthesis were obtained. 
These were interpreted both primarily and with the aid of computer-aided 
detection system.  
BREAST DENSITY: (Level B) There are scattered areas of fibroglandular density.

[L CC]
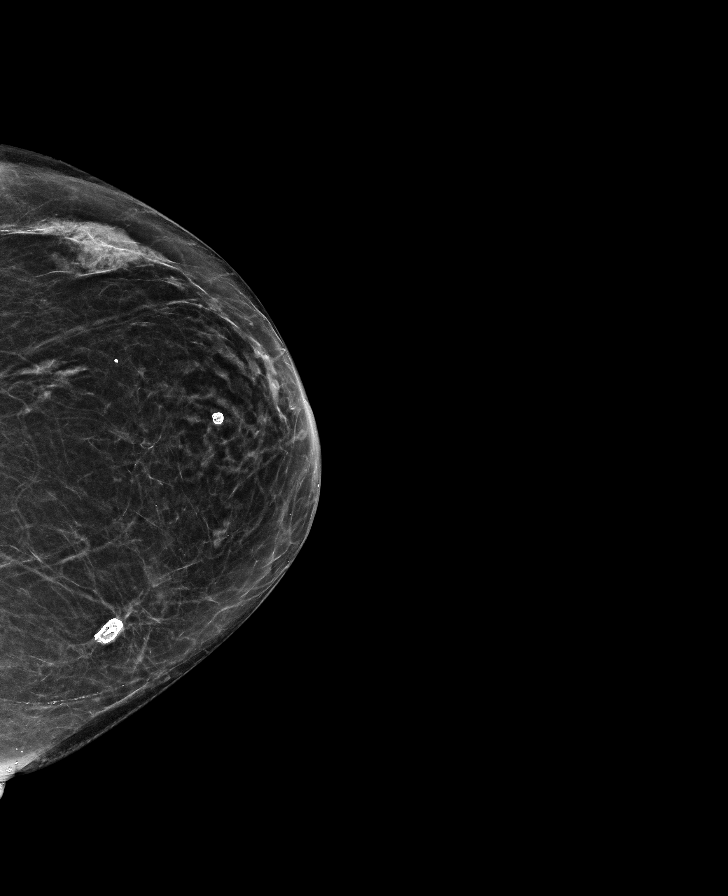

[L MLO]
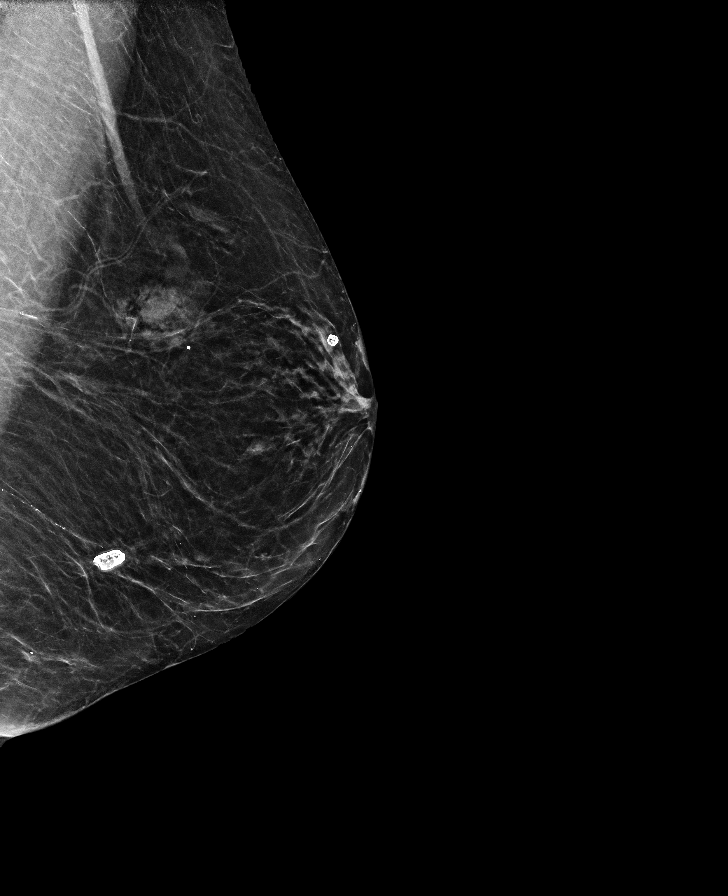

[R MLO]
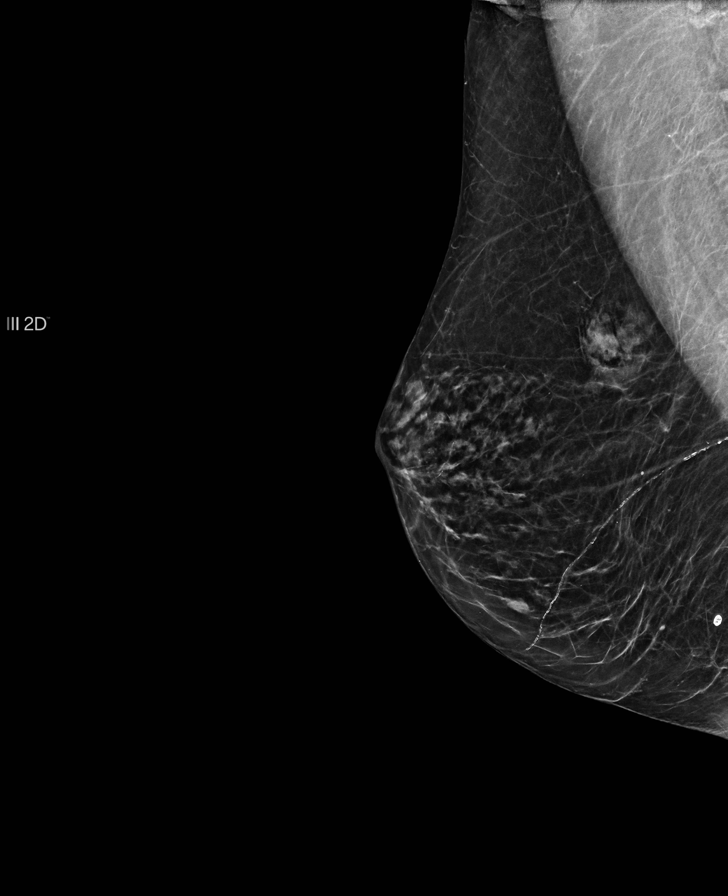

[R CC]
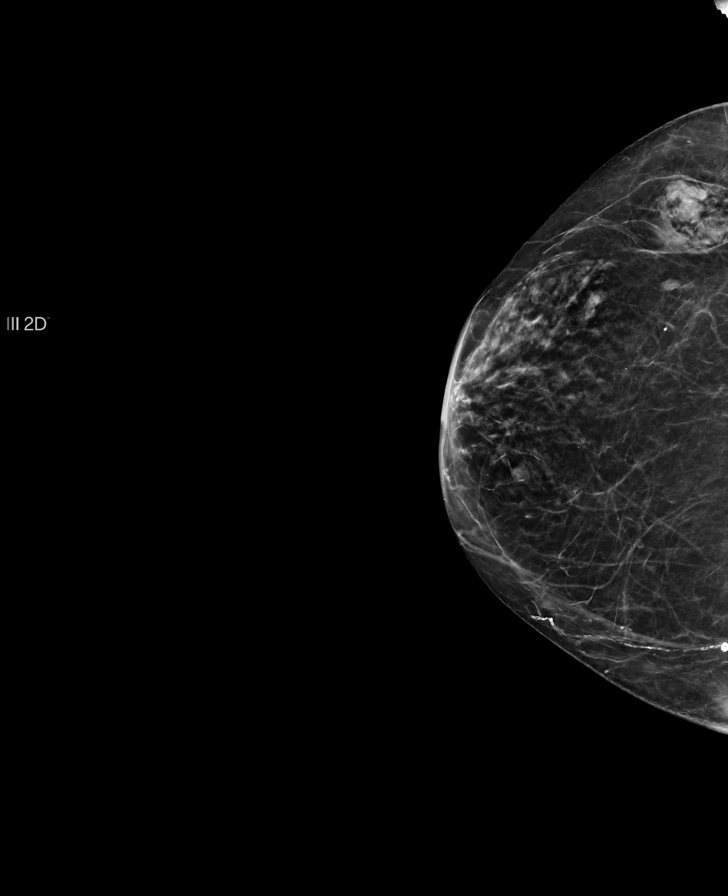

[L MLO tomo · tomo slice 24/47.0]
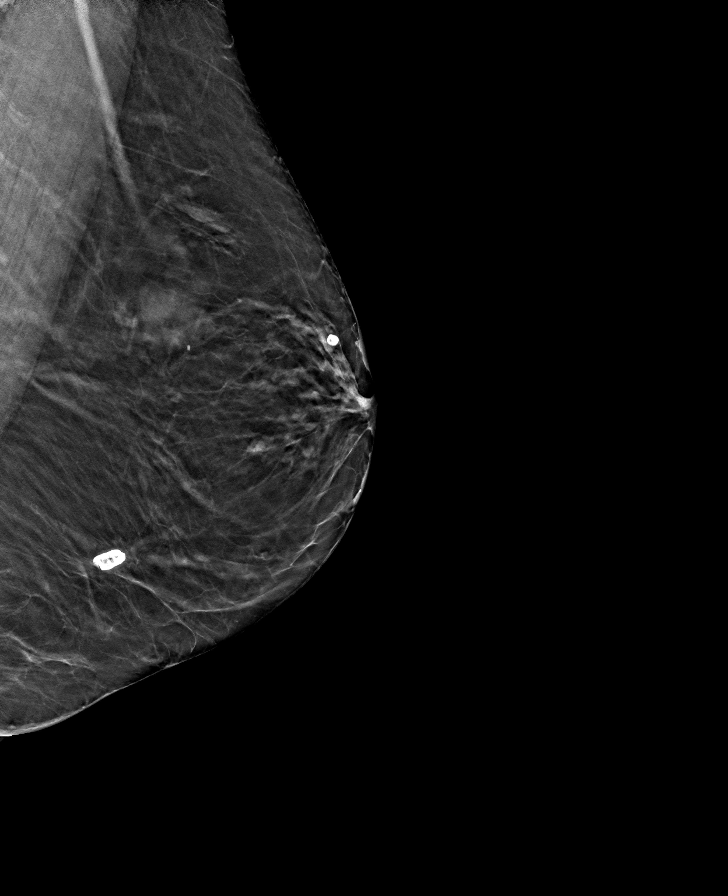

[L CC tomo · tomo slice 25/50.0]
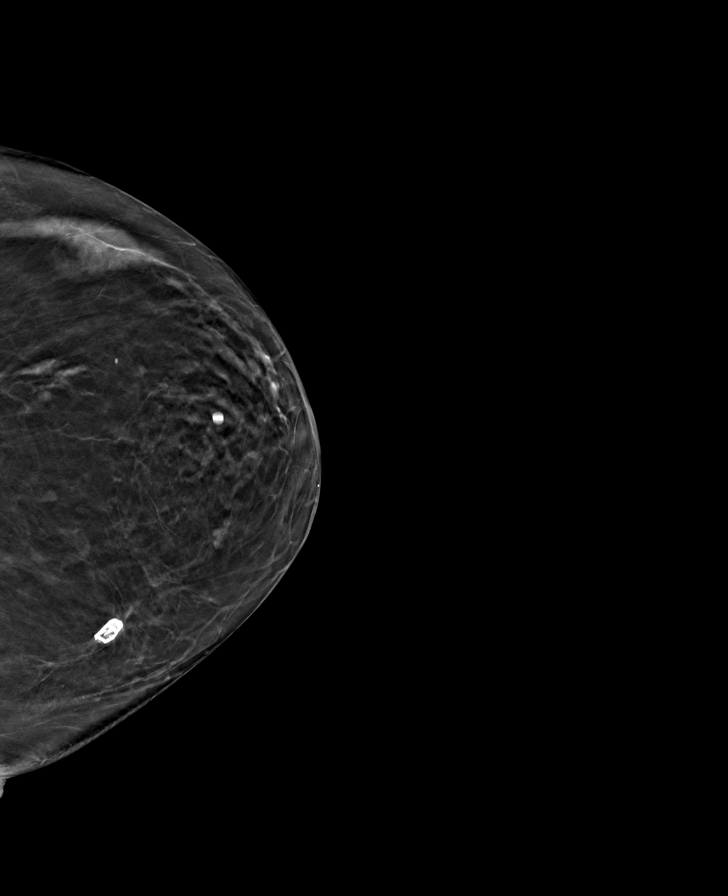

[R CC tomo · tomo slice 23/46.0]
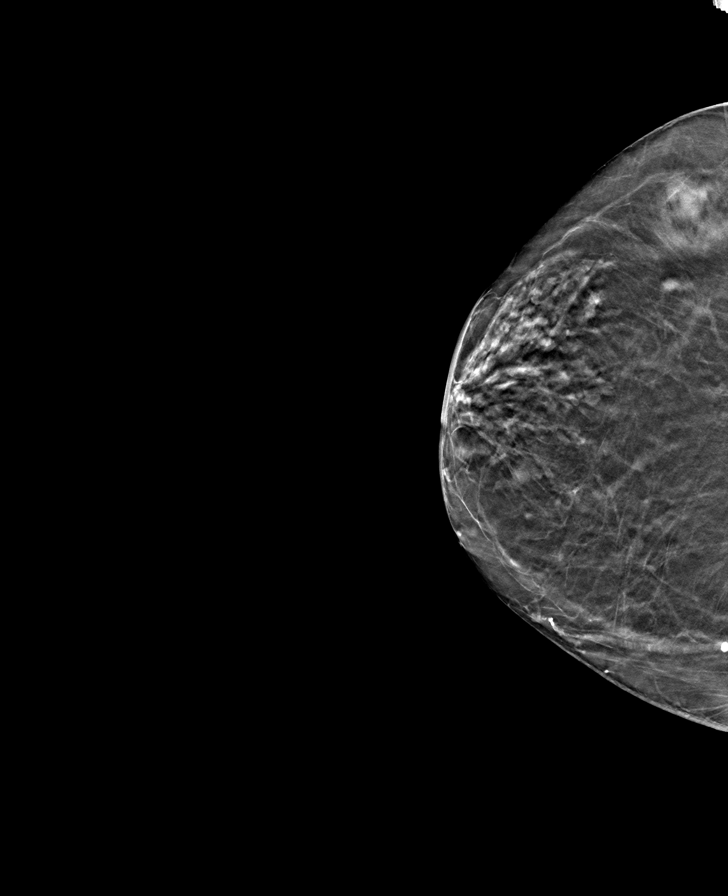

[R MLO tomo · tomo slice 25/49.0]
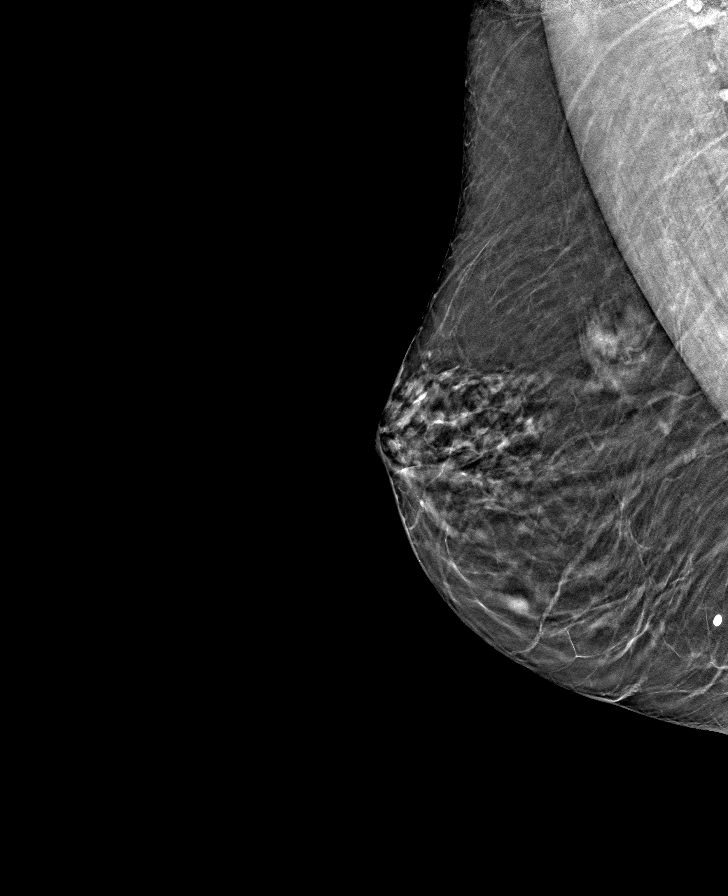

[8 of 24 positions shown; findings below may reference images not displayed]

FINDINGS: There is asymmetry, possibly tissue asymmetry, in the upper outer 
quadrants of both breasts, we will attempt to acquire previous mammograms to 
assess stability of these findings. If previous mammograms cannot be located 
patient may be brought back for additional views of both breasts. No suspicious 
clusters of microcalcifications in either breast.
IMPRESSION: There is asymmetry, possibly tissue asymmetry, in the upper outer quadrants of 
both breasts, we will attempt to acquire previous mammograms to assess stability 
of these findings. If previous mammograms cannot be located patient may be 
brought back for additional views of both breasts. No suspicious clusters of 
microcalcifications in either breast. 
(BI-RADS 0) Incomplete. Further evaluation will be performed as discussed above 
and the results will be reported separately.

## 2022-03-14 IMAGING — MG MAMMOGRAPHY DIAGNOSTIC BILATERAL 3[PERSON_NAME]
8 series · 9 of 24 positions shown · non-contrast
Comparison: 11/17/2021.

________________________________________________________________________________________________ 
MAMMOGRAPHY DIAGNOSTIC BILATERAL 3BOO ZEIDAN, 03/14/2022 [DATE]: 
CLINICAL INDICATION: Callback from screening mammogram.
TECHNIQUE: Digital bilateral mammograms and 3-D Tomosynthesis were obtained. 
These were interpreted both primarily and with the aid of computer-aided 
detection system.  
BREAST DENSITY: (Level B) There are scattered areas of fibroglandular density.

[R CC]
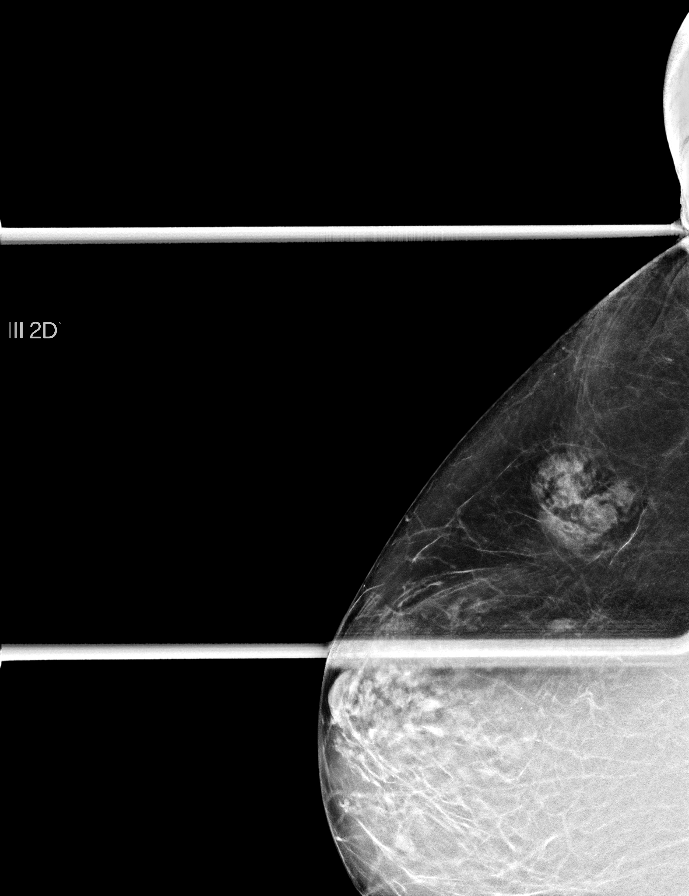

[L MLO]
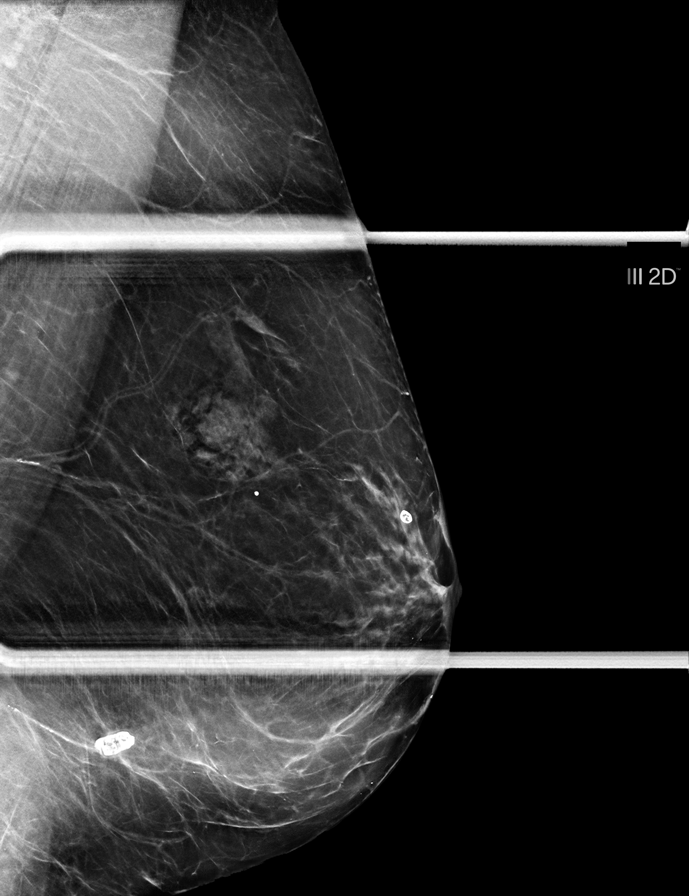

[R MLO]
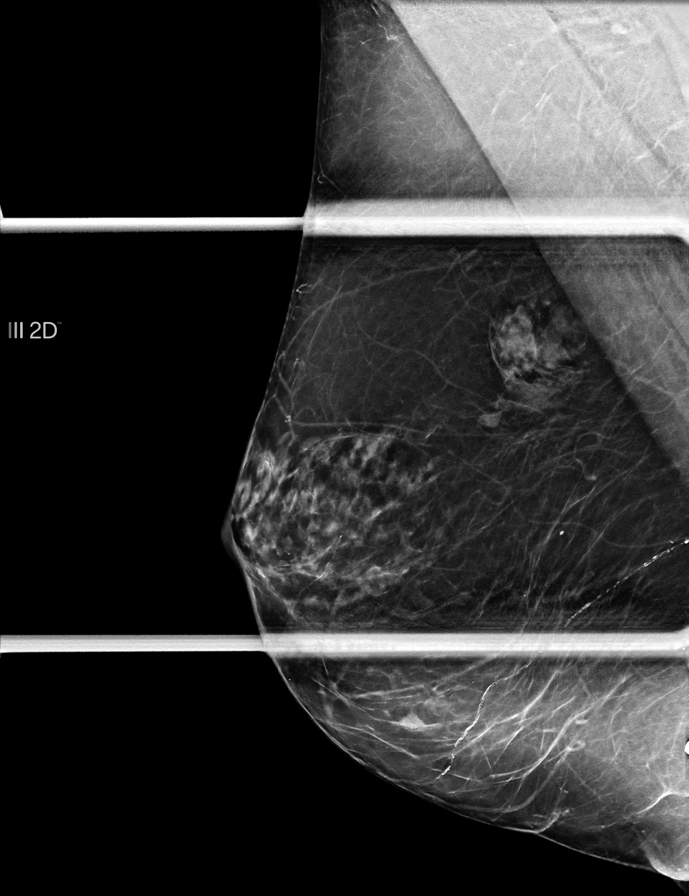

[L CC]
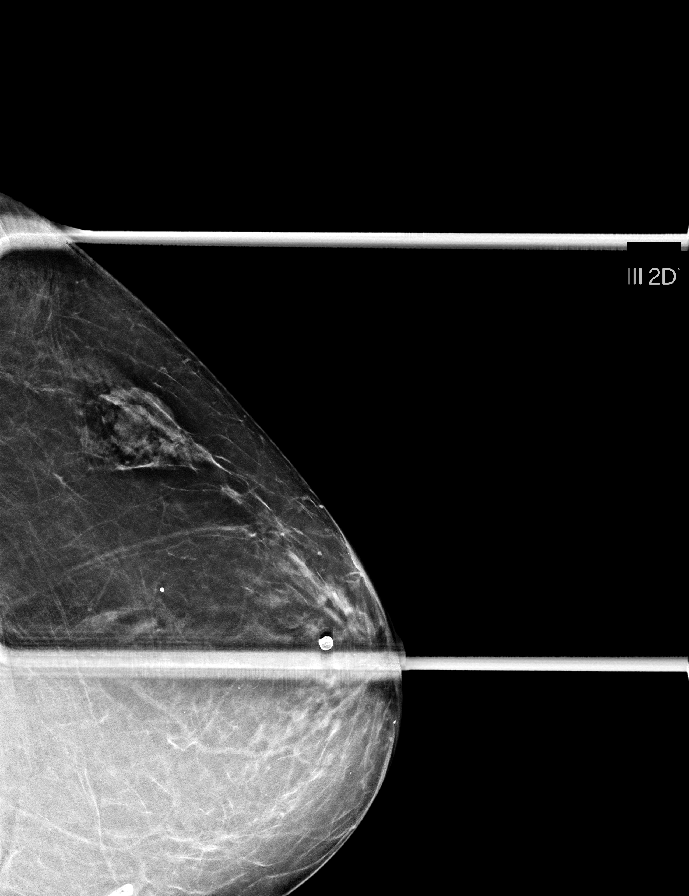

[R MLO tomo · 2 of 53 frames shown]
[frame 18/53]
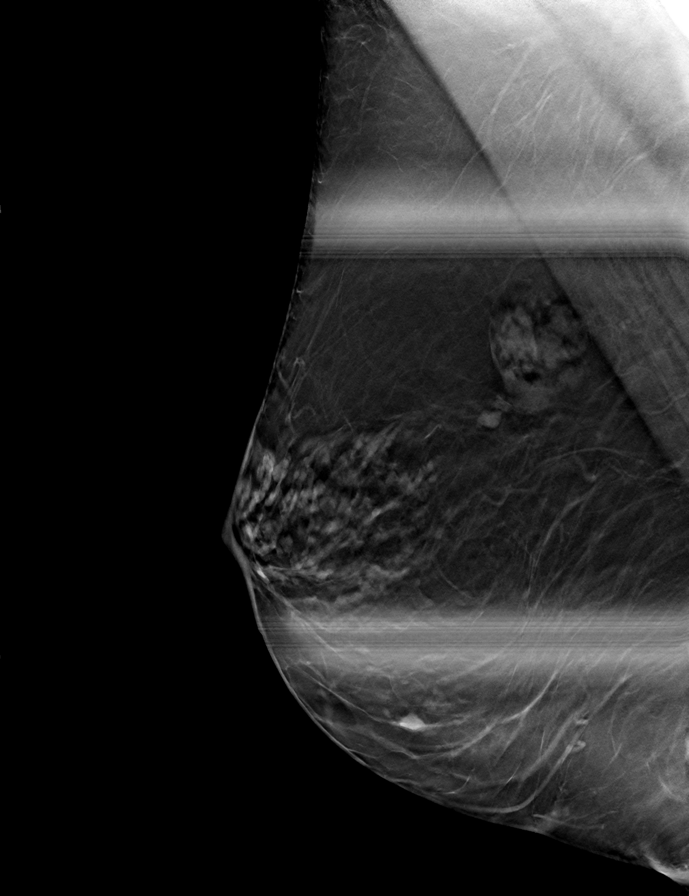
[frame 27/53]
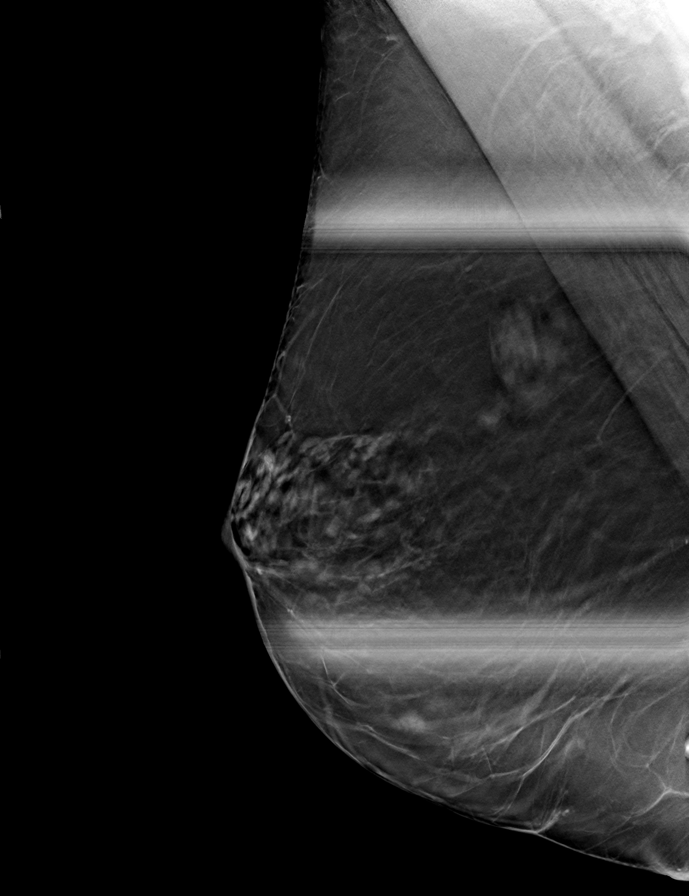

[L MLO tomo · tomo slice 29/57.0]
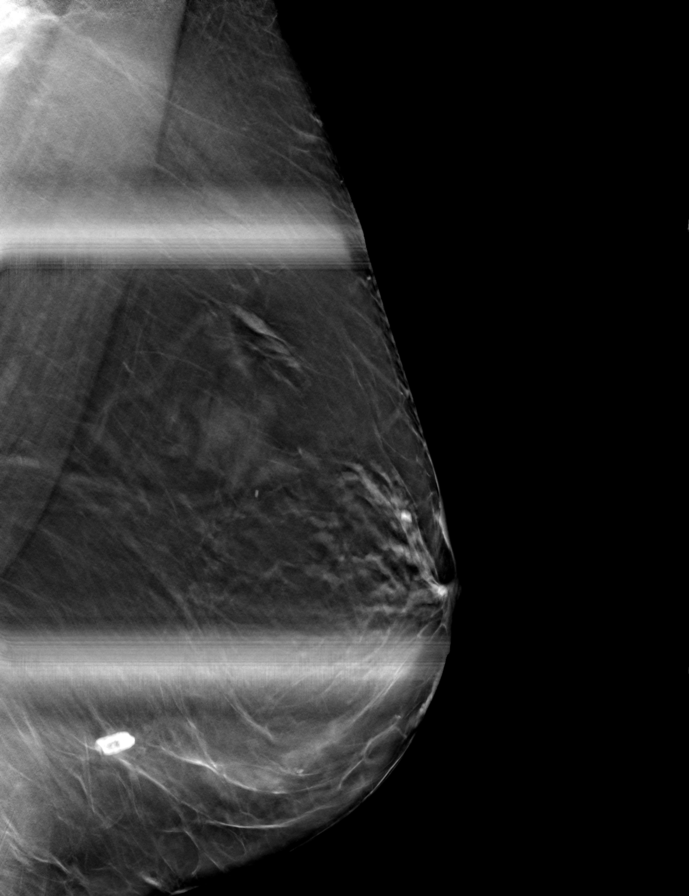

[L CC tomo · tomo slice 30/59.0]
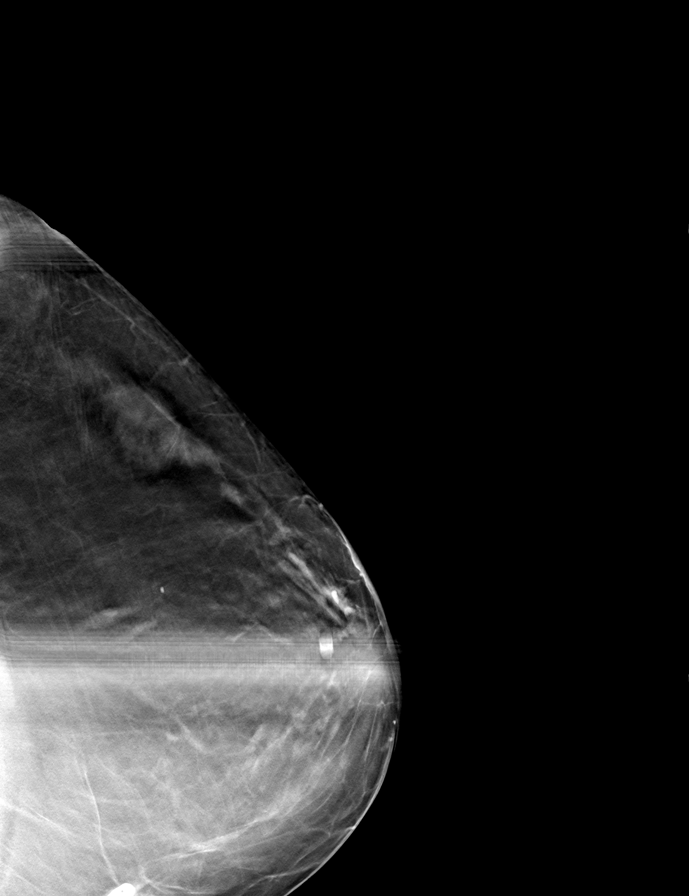

[R CC tomo · tomo slice 26/51.0]
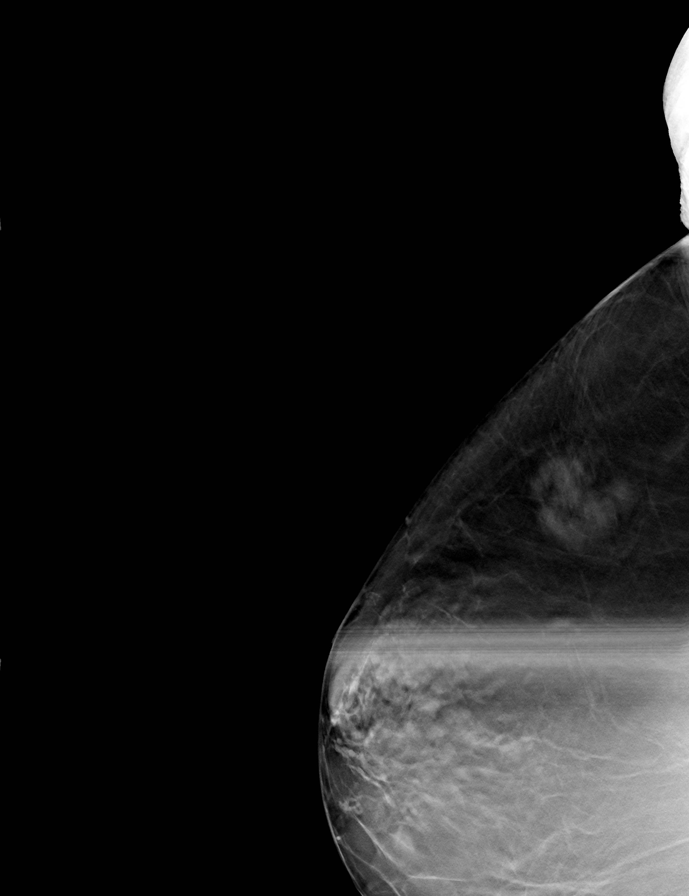

[9 of 24 positions shown; findings below may reference images not displayed]

FINDINGS: The areas in question within both breasts compress as normal 
fibroglandular parenchyma. No suspicious mass, calcifications or area of 
architectural distortion.
IMPRESSION: No mammographic findings suggestive for malignancy. 
(BI-RADS 2) Benign findings. Routine mammographic follow-up is recommended.

## 2022-06-19 IMAGING — DX CHEST PA AND LATERAL
1 series · 2 of 2 positions shown · non-contrast
Comparison: None.

________________________________________________________________________________________________ 
CLINICAL INDICATION: Pruritus.

[Series 1: PA · U · 0.14mm/px · 2 of 2 slices shown]
[im 1/2]
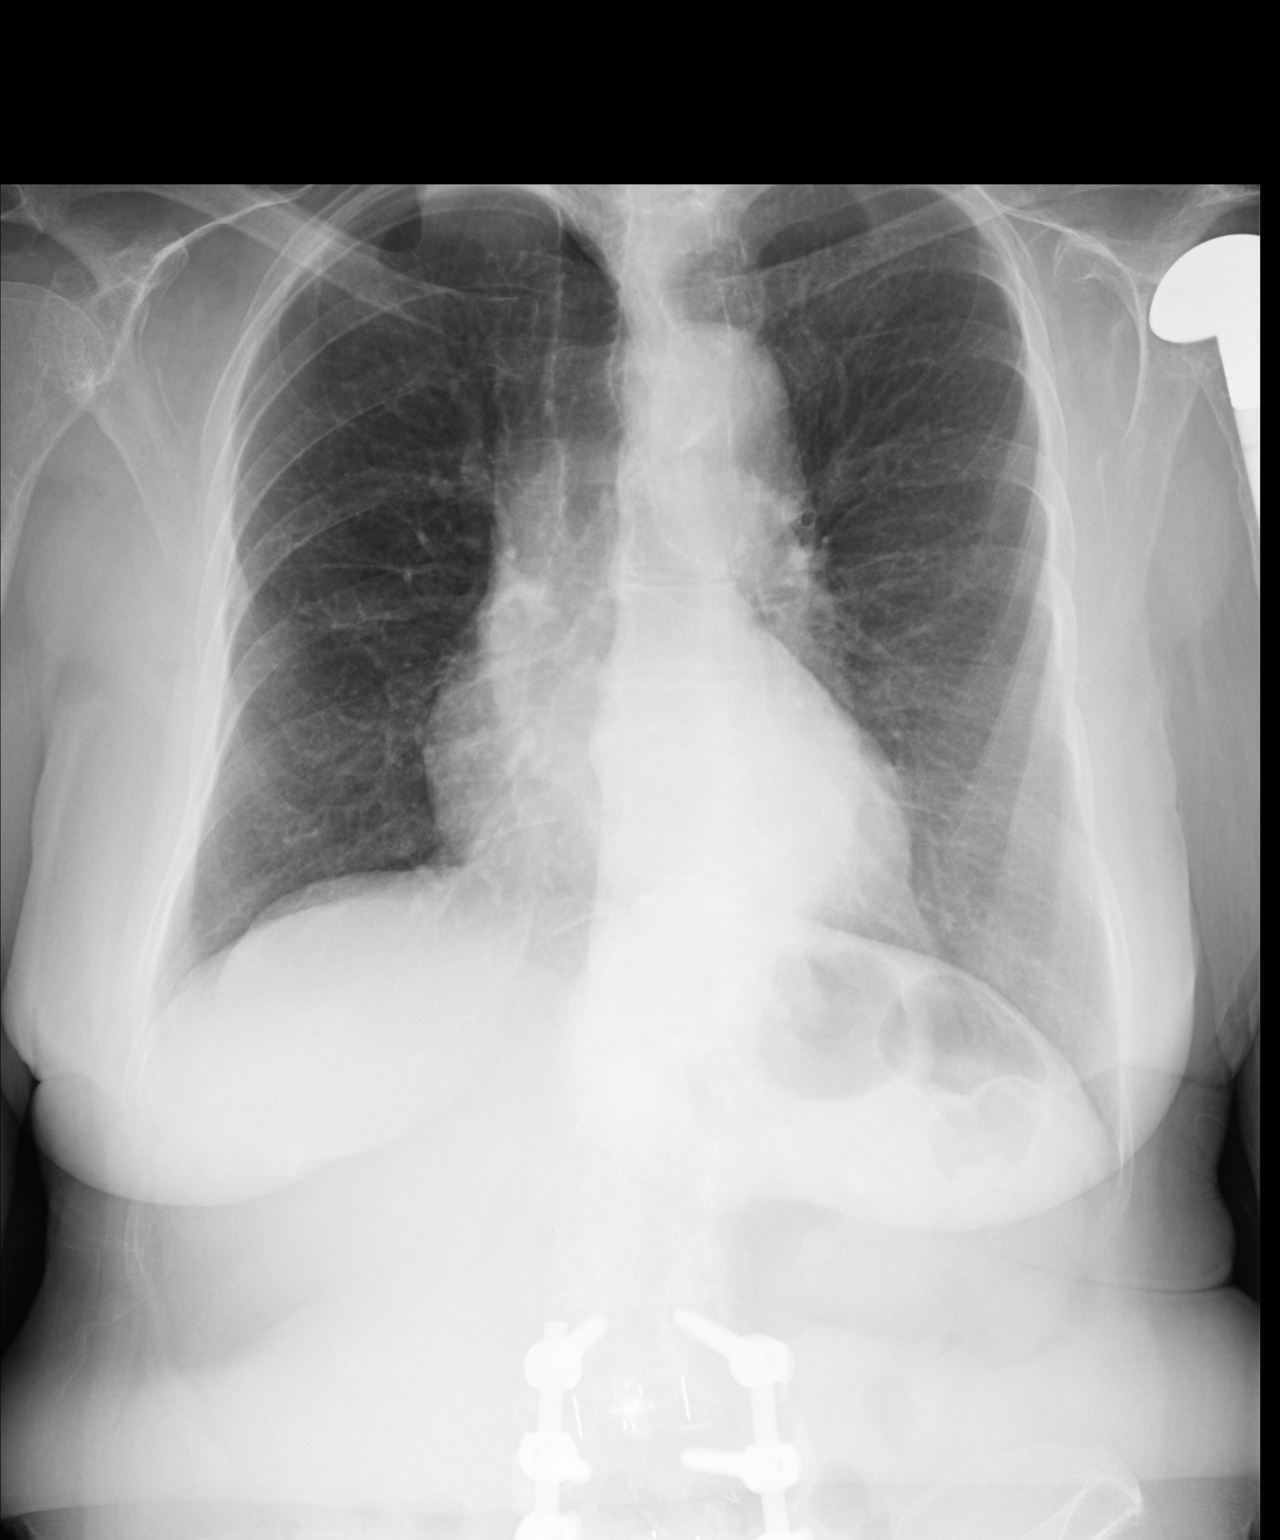
[im 2/2]
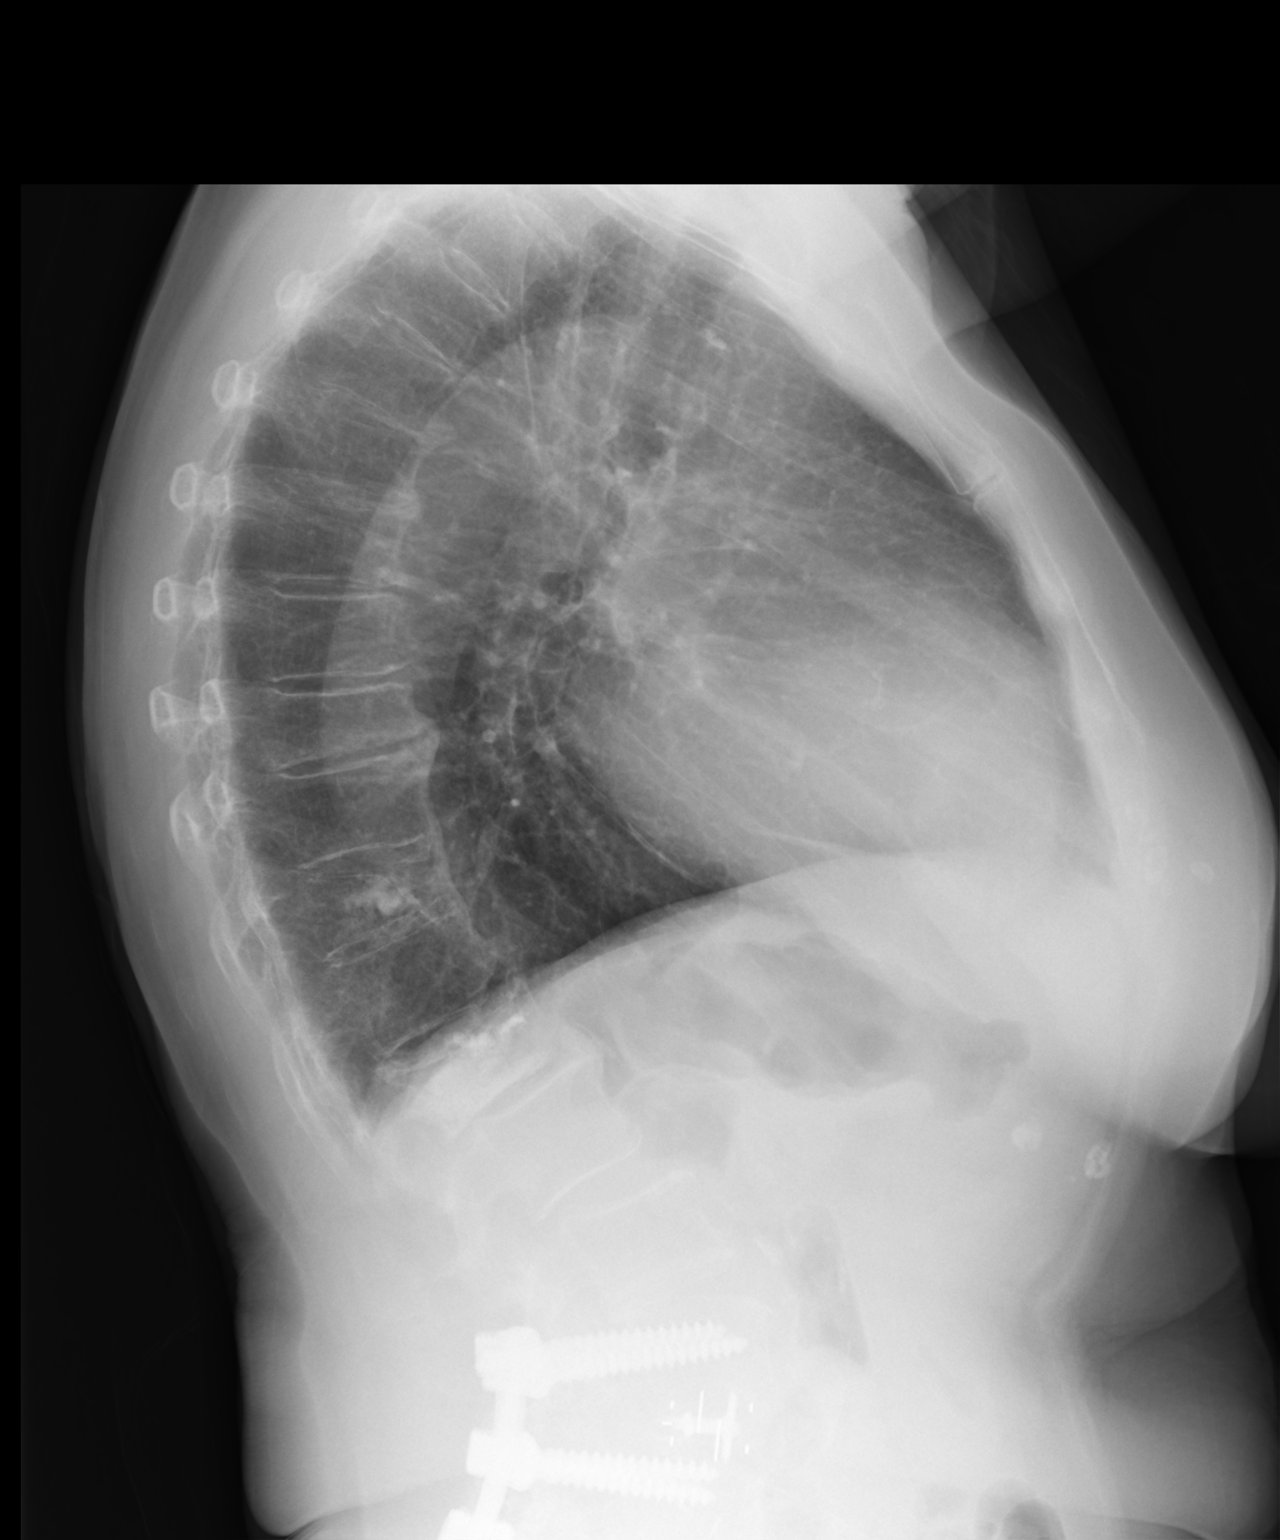

[2 of 2 positions shown; findings below may reference images not displayed]

FINDINGS: Postoperative changes left shoulder. Postoperative changes lumbar 
spine. Atherosclerotic changes and degenerative changes. Changes of previous 
vertebroplasty. No mass or consolidation identified.
IMPRESSION: No acute cardiopulmonary findings.

## 2022-09-19 IMAGING — CT CT NECK WITH CONTRAST
3 series · 12 of 33 positions shown, 14 images · IV contrast (ISOVUE 300)
Comparison: None.

________________________________________________________________________________________________ 
CT NECK WITH CONTRAST, 09/19/2022 [DATE]: 
CLINICAL INDICATION: Left-sided neck lump. 
A search for DICOM formatted images was conducted for prior CT imaging studies 
completed at a non-affiliated media free facility.
TECHNIQUE: The neck was scanned from the level of the maxillary sinuses through 
the AP Window with 50 mL of Isovue 300 MDV injected intravenously on a 
high-resolution CT scanner using dose reduction techniques. 0  mL of Isovue 300 
MDV were discarded. Routine MPR reconstructions were performed. Count of known 
CT and Cardiac Nuclear Medicine studies performed in the previous 12 months = 0.

[Series 2: neck w 0.75 b31s · axial · 0.41mm/px · z∈[+864,+1061]mm · 4 of 402 slices shown, 5 images]
[im 62/402  soft-tissue]
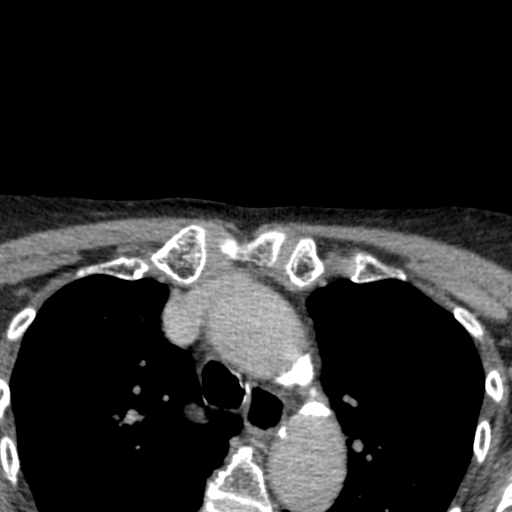
[im 62/402  bone]
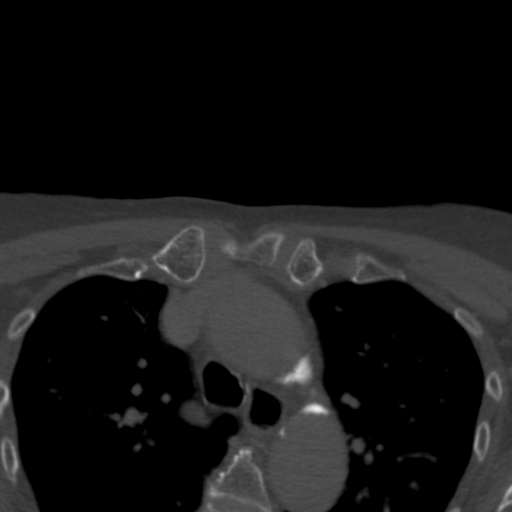
[im 155/402  bone]
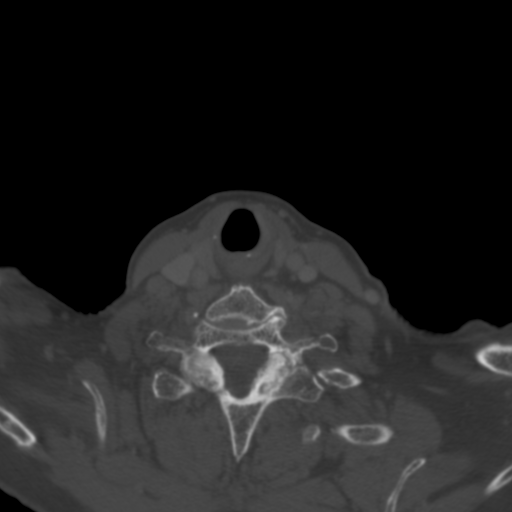
[im 247/402  bone]
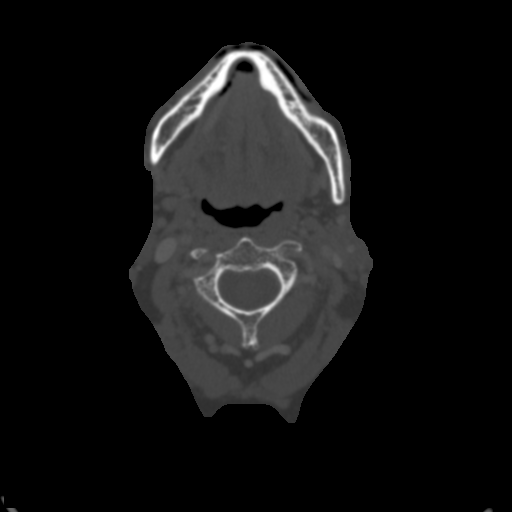
[im 340/402  bone]
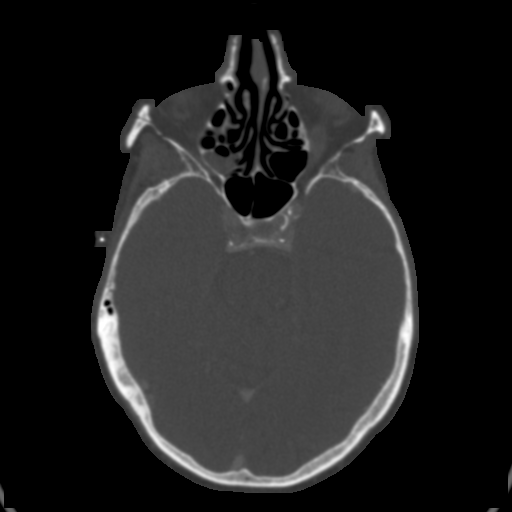

[Series 3: coronal · coronal · 0.43mm/px · 3 of 310 slices shown]
[im 62/310  bone]
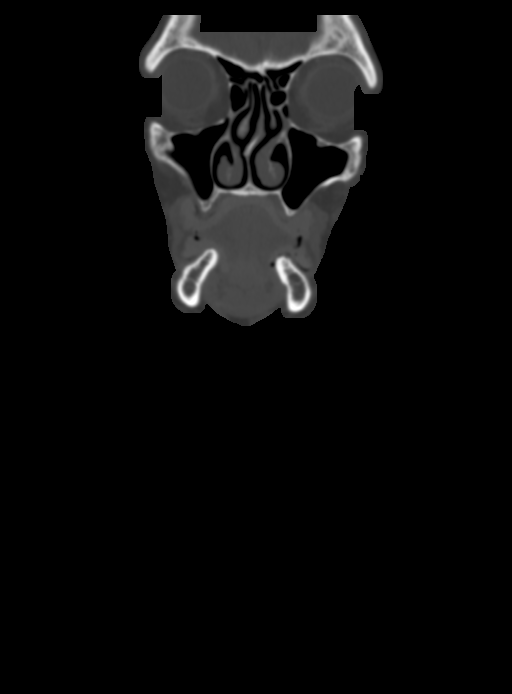
[im 124/310  bone]
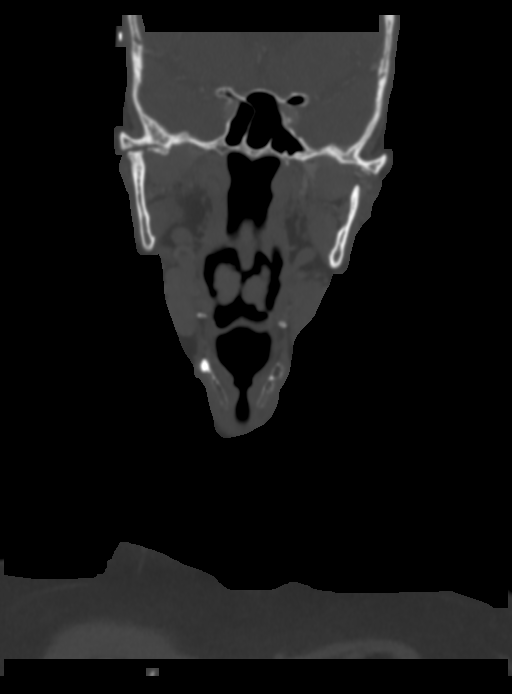
[im 186/310  bone]
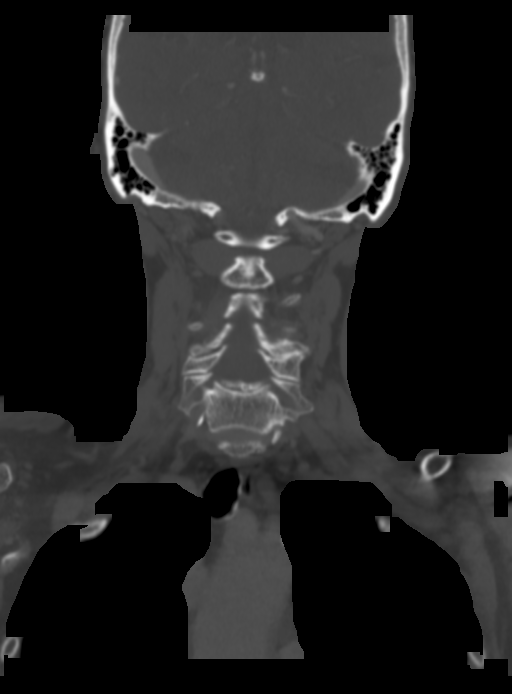

[Series 4: sag · sagittal · 0.43mm/px · 5 of 326 slices shown, 6 images]
[im 109/326  bone]
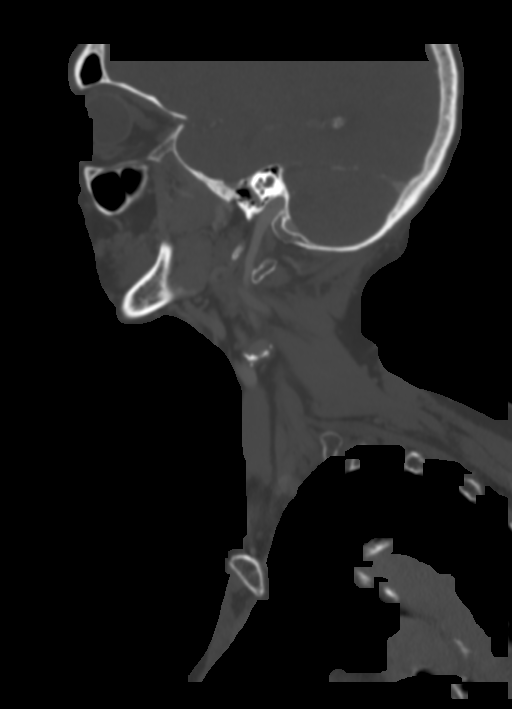
[im 136/326  bone]
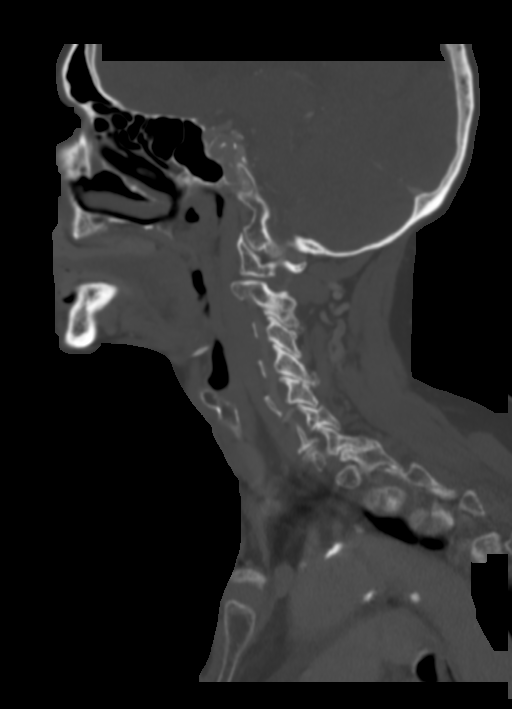
[im 163/326  soft-tissue]
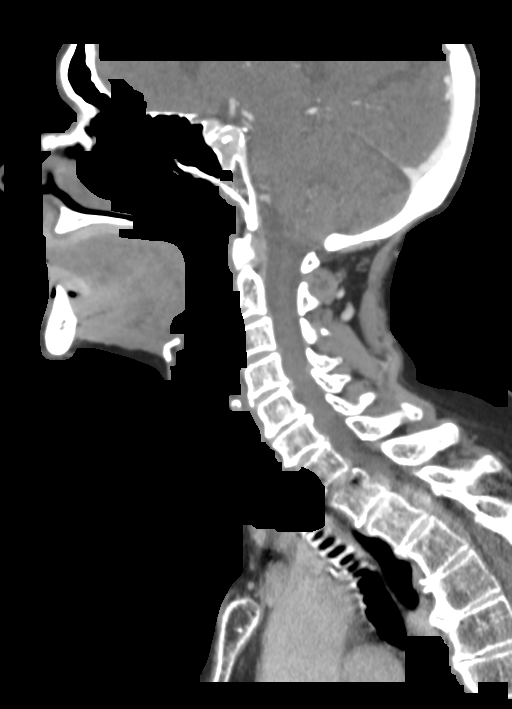
[im 163/326  bone]
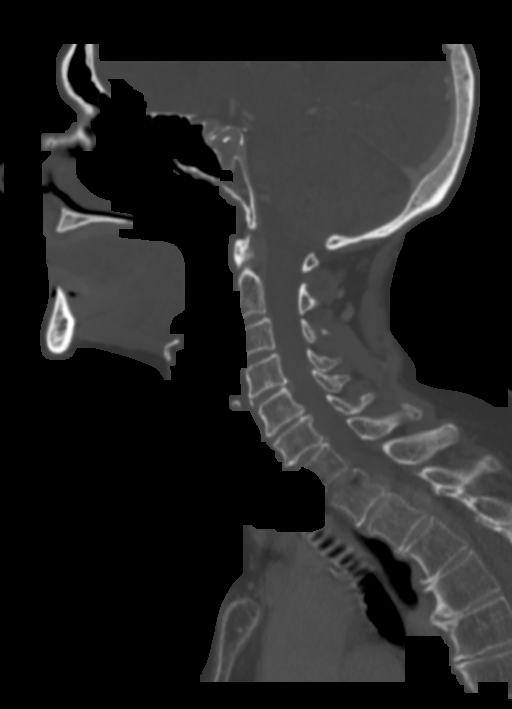
[im 190/326  bone]
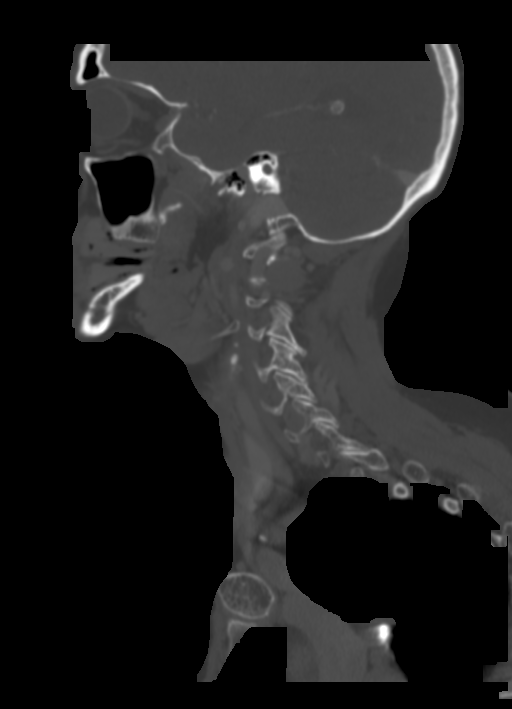
[im 217/326  bone]
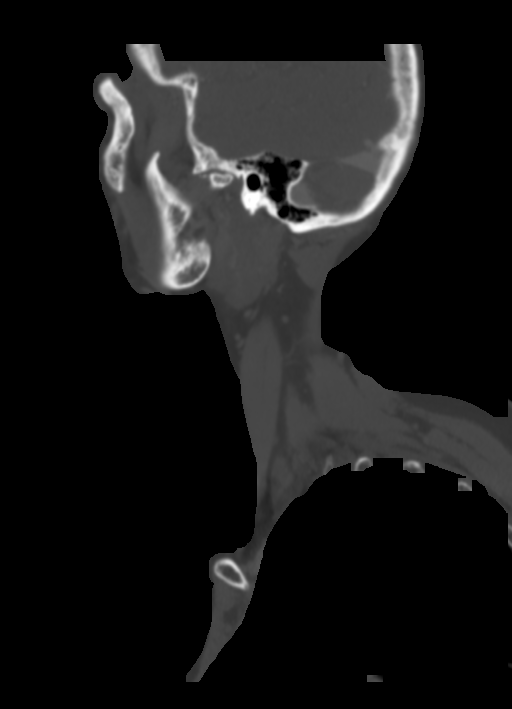

[12 of 33 positions shown; findings below may reference images not displayed]

FINDINGS: --------------------------------------------------------------------------- 
Neck: 
AERODIGESTIVE: Patency of the aerodigestive tract without evidence of exophytic 
lesion. 
SALIVARY GLANDS: BB markers are overlying the left parotid gland, the left 
parotid gland is asymmetrically larger than the right side with mild 
heterogeneity within the gland itself as well as infiltration of the adjacent 
subcutaneous fat along the posterior margin of the gland. Findings do raise 
concern for underlying left parotid gland inflammation versus underlying 
infiltrative neoplasm. 
THYROID: Thyroid gland is homogeneous. 
LYMPH NODES: No pathologic cervical lymphadenopathy.  
VASCULAR: Patency of cervical vasculature noting atherosclerotic changes.  
OSSEOUS STRUCTURES: Cervical spine shows no acute osseous abnormality noting 
degenerative changes. 
ADDITIONAL FINDINGS: None. 
--------------------------------------------------------------------------- 
Visualized head: 
BRAIN:  Visualized portions of the brain are within normal limits.   
SINUSES: Opacified right posterior ethmoidal air cell.   
TEMPORAL BONES: Mastoid air cells and middle ear cavities are clear. Surgical 
clips along the right infratemporal fossa.  
ORBITS: No orbital masses.   
--------------------------------------------------------------------------- 
Visualized chest: 
LUNGS: 4 mm left upper lobe nodule (image 385 axial images). 
MEDIASTINUM: Visualized superior mediastinum is unremarkable. 
CHEST WALL: Visualized upper chest wall is unremarkable. 
---------------------------------------------------------------------------
IMPRESSION: 1.  Findings concerning for left parotid gland inflammation versus infiltrative 
lesion. No pathologic cervical lymphadenopathy. Further evaluation with MRI neck 
without and with contrast is recommended. 
2.  Left upper lobe 4 mm nodule. Follow-up as per [HOSPITAL] guidelines 
advised. 
3.  Discussed with Dr. Tois at [DATE] on 09/19/2022. 
--------------------------------------------------------------------------- 
[HOSPITAL] Recommendations for Follow-up and Management of Nodules 
Smaller than 8 mm Detected Incidentally at Nonscreening CT.* 
--------------------------------------------------------------------------- 
Nodule size <6 mm (single) 
Low-Risk Patient - No routine follow-up 
High-Risk Patient - Optional CT at 12 months 
**Nodules <6 mm do not require routine follow-up, but certain patients at high 
risk with suspicious nodule morphology, upper lobe location, or both [DATE] month follow-up (recommendation 1A) 
--------------------------------------------------------------------------- 
RADIATION DOSE REDUCTION: All CT scans are performed using radiation dose 
reduction techniques, when applicable.  Technical factors are evaluated and 
adjusted to ensure appropriate moderation of exposure.  Automated dose 
management technology is applied to adjust the radiation doses to minimize 
exposure while achieving diagnostic quality images.

## 2022-10-31 IMAGING — MR MRI ORBITS FACE OR NECK  W/WO CONTRAST
8 of 12 series · 30 of 48 positions shown · IV contrast (gadolinium)
Comparison: CT neck from September 19, 2022.

________________________________________________________________________________________________ 
MRI ORBITS FACE OR NECK  W/WO CONTRAST, 10/31/2022 [DATE]: 
CLINICAL INDICATION: Left parotid lesion versus inflammation.
TECHNIQUE: Multiplanar, multiecho position MR images of the orbits were 
performed without and with intravenous gadolinium enhancement.  5.5 mL of 
Gadavist were injected intravenously by hand. 2.0 mL of Gadavist were discarded. 
Patient was scanned on a 1.5T magnet.

[Series 201: survey · axial · 10.0mm · 0.94mm/px · z∈[+0,+165]mm · 2 of 9 slices shown]
[im 1/9]
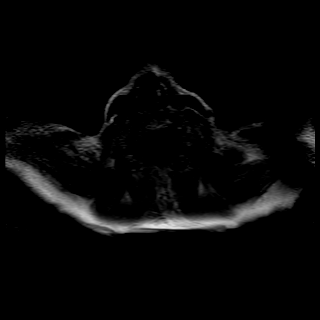
[im 9/9]
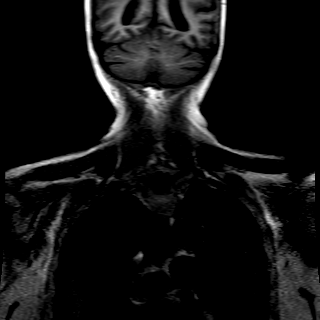

[Series 301: T2 · sagittal · 4.0mm · 0.42mm/px · 5 of 32 slices shown]
[im 1/32]
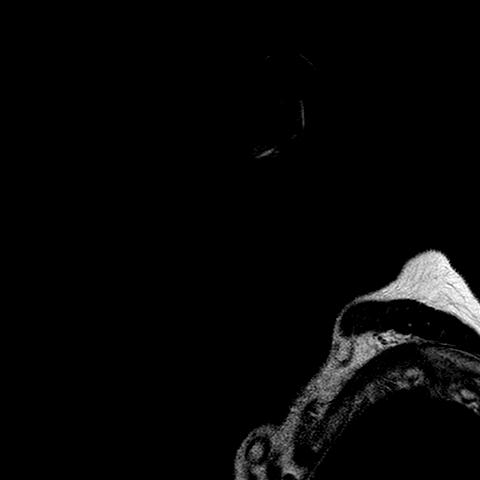
[im 8/32]
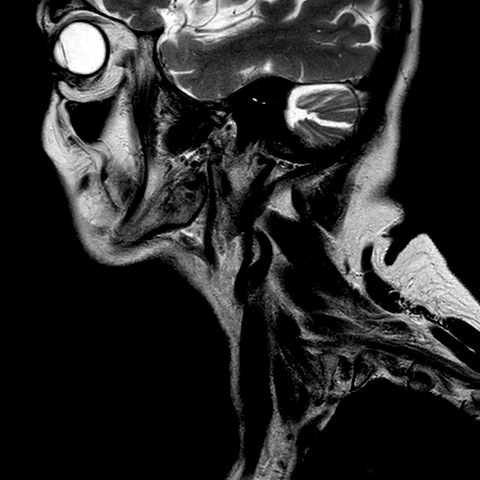
[im 16/32]
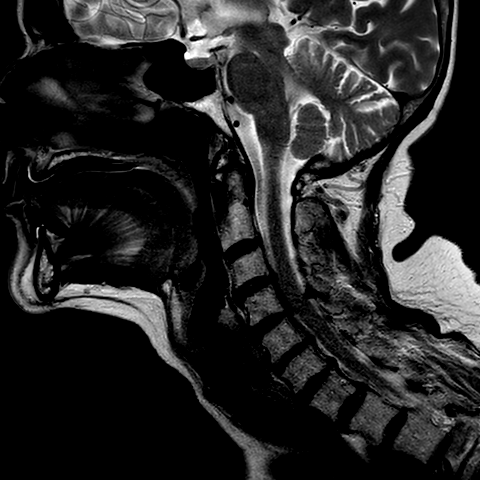
[im 24/32]
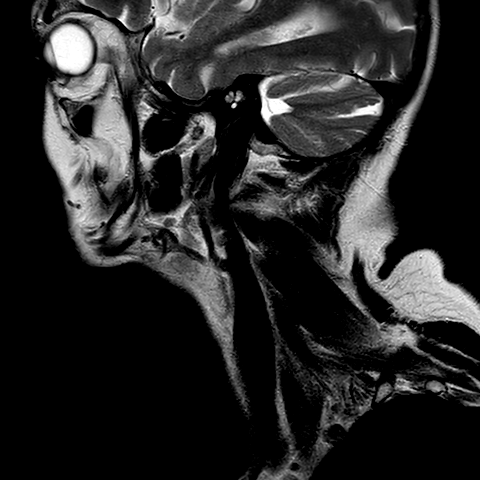
[im 32/32]
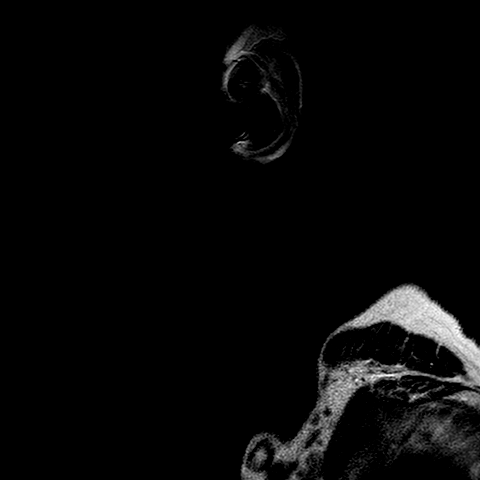

[Series 402: (id)_f/s dixon_parotid · axial · 3.5mm · 0.46mm/px · z∈[-14,+125]mm · 5 of 32 slices shown]
[im 1/32]
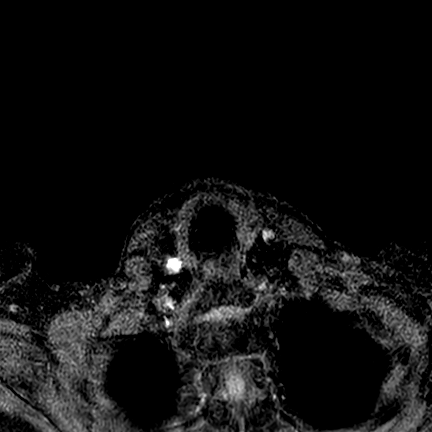
[im 8/32]
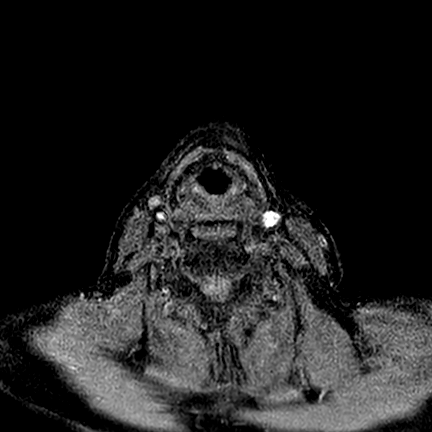
[im 16/32]
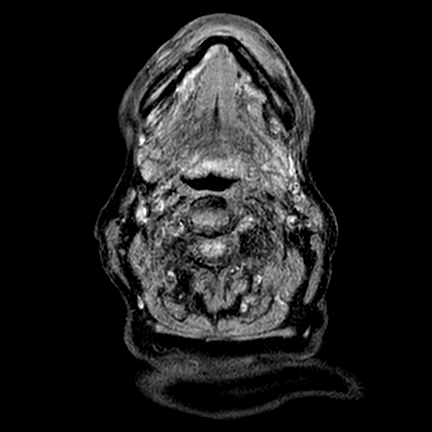
[im 24/32]
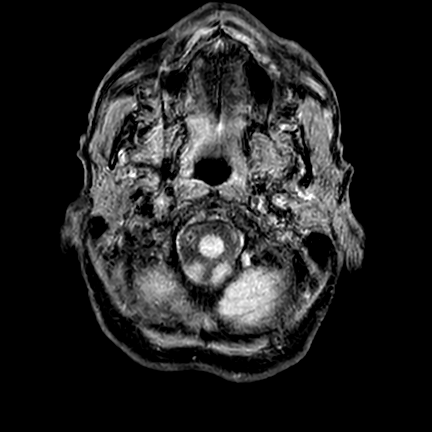
[im 32/32]
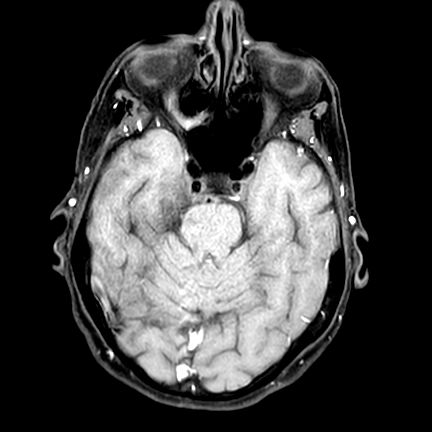

[Series 403: (id) dixon_parotid · axial · 3.5mm · 0.46mm/px · z∈[-14,+125]mm · 4 of 32 slices shown]
[im 1/32]
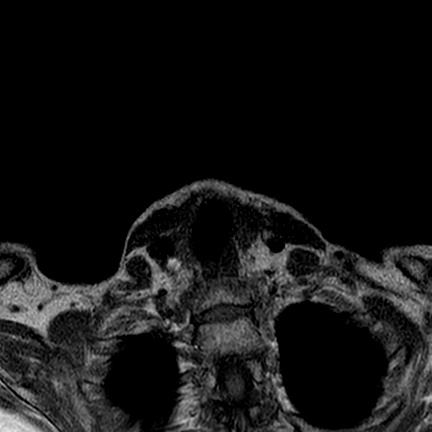
[im 11/32]
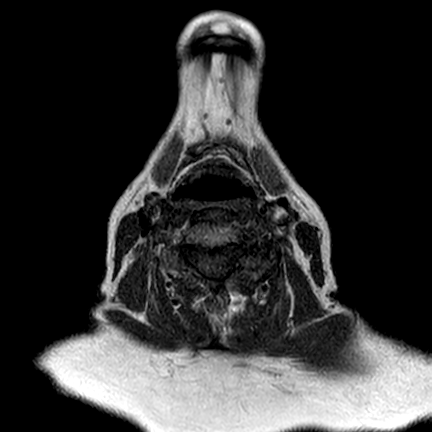
[im 21/32]
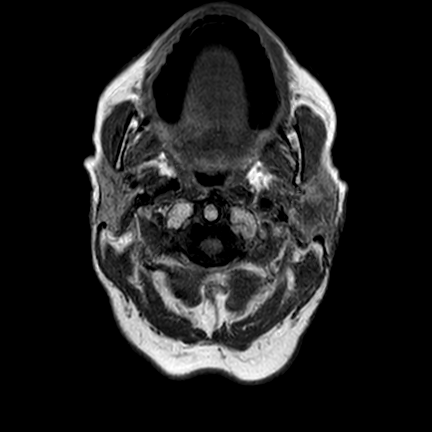
[im 32/32]
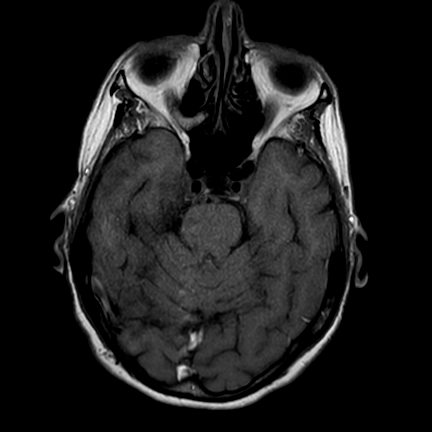

[Series 502: st2w fs mdixon_tse_rl · axial · 3.5mm · 0.57mm/px · z∈[-14,+125]mm · 4 of 32 slices shown]
[im 1/32]
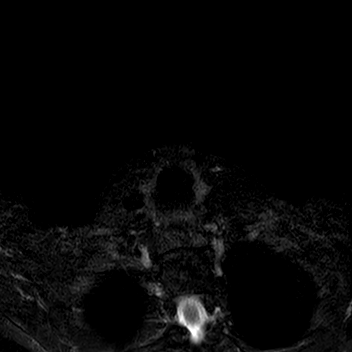
[im 11/32]
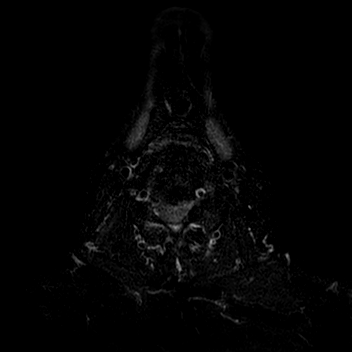
[im 21/32]
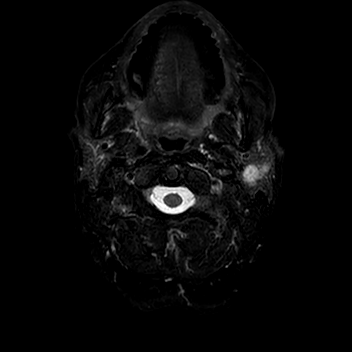
[im 32/32]
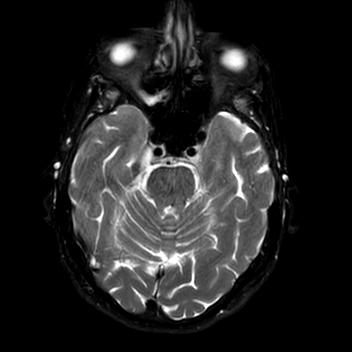

[Series 503: st2w mdixon_tse_rl · axial · 3.5mm · 0.57mm/px · z∈[-14,+125]mm · 4 of 32 slices shown]
[im 1/32]
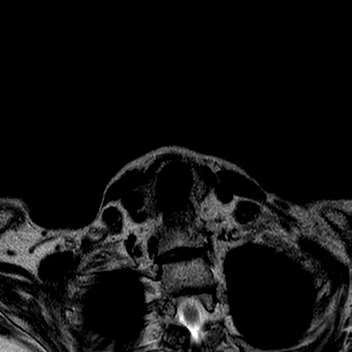
[im 11/32]
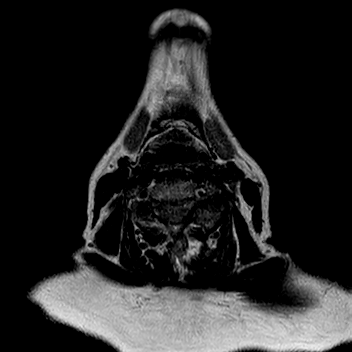
[im 21/32]
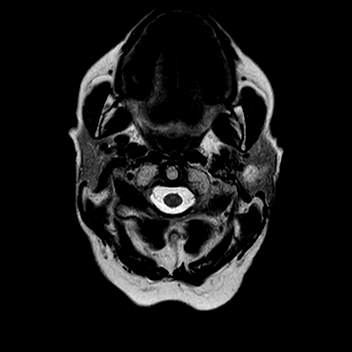
[im 32/32]
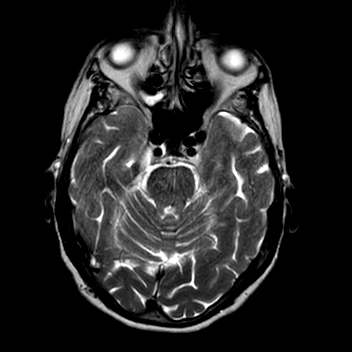

[Series 602: (id) fs dixon_tse_cor · coronal · 3.5mm · 0.62mm/px · 4 of 32 slices shown]
[im 1/32]
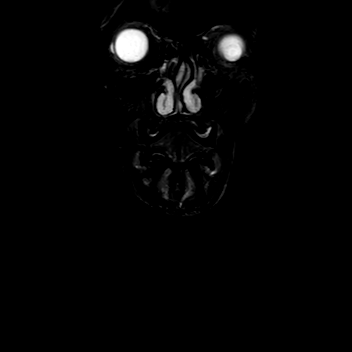
[im 11/32]
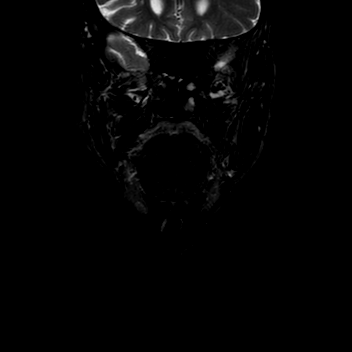
[im 21/32]
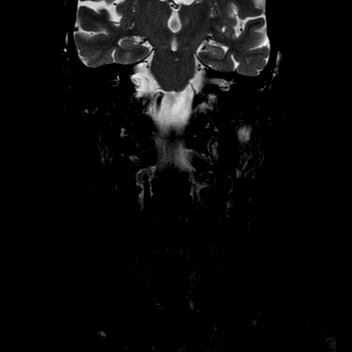
[im 32/32]
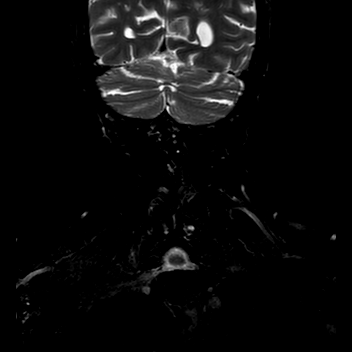

[Series 603: (id) dixon_tse_cor · coronal · 3.5mm · 0.62mm/px · 2 of 32 slices shown]
[im 1/32]
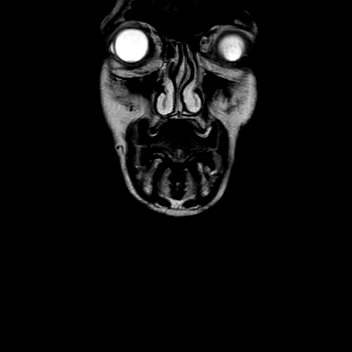
[im 11/32]
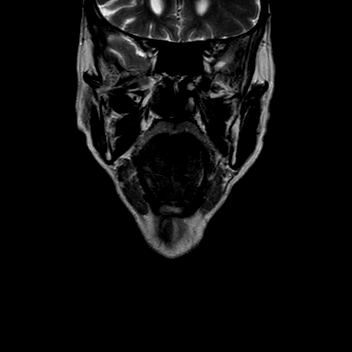

[30 of 48 positions shown; findings below may reference images not displayed]

FINDINGS: --------------------------------------------------------------------------- 
Neck: 
AERODIGESTIVE: Patency of the aerodigestive tract without evidence of exophytic 
lesion. 
SALIVARY GLANDS: Left parotid lobulated mass measuring 1.8 x 1.4 x 1.6 cm  
(maximum axial dimension x axial orthogonal x craniocaudal). 
THYROID: Thyroid gland is homogeneous. 
LYMPH NODES: No pathologic cervical lymphadenopathy.  
VASCULAR: Patency of vascular flow voids.  
OSSEOUS STRUCTURES: Cervical spine shows no acute osseous abnormality noting 
degenerative changes. 
ADDITIONAL FINDINGS: None. 
--------------------------------------------------------------------------- 
Visualized head: 
BRAIN:  Visualized portions of the brain are within normal limits.   
SINUSES: Mucosal thickening versus retention cyst along the right posterior 
ethmoidal air cell.   
TEMPORAL BONES: Mastoid air cells and middle ear cavities are clear.  
ORBITS: No orbital masses.   
---------------------------------------------------------------------------
IMPRESSION: Left parotid mass. While this may represent nonaggressive neoplasm such as 
pleomorphic adenoma, more aggressive neoplasms are not excluded, advise 
histologic correlation. No pathologic lymphadenopathy.

## 2023-03-22 IMAGING — MR MRI BRAIN WITHOUT CONTRAST
9 of 12 series · 25 of 48 positions shown · IV contrast (gadolinium)
Comparison: MRI of the face October 31, 2022. CT neck September 19, 2022.

________________________________________________________________________________________________ 
MRI BRAIN WITHOUT CONTRAST, 03/22/2023 [DATE]: 
CLINICAL INDICATION: Headache, Unspecified , dizziness and occasional balance 
issues. History of skin cancer.
TECHNIQUE: Multiplanar, multiecho position MR images of the brain were performed 
without intravenous gadolinium enhancement. Patient was scanned on a
magnet.

[Series 102: mpr - smartbrain · axial · 1.1mm · 1.09mm/px · 1 of 2 slices shown]
[im 1/2]
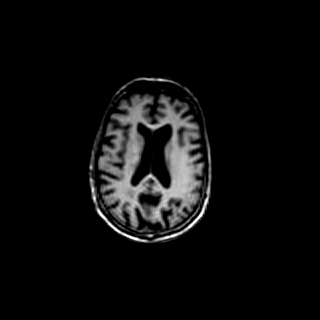

[Series 103: patient aligned mpr · axial · 21.9mm · 1.09mm/px · z∈[-150,+153]mm · 4 of 53 slices shown]
[im 1/53]
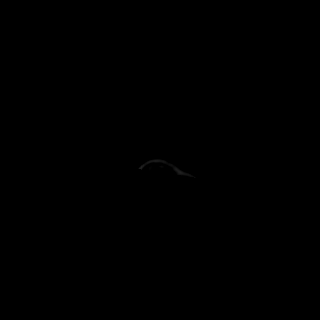
[im 18/53]
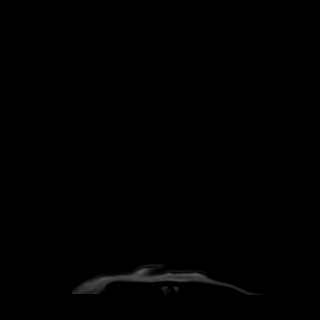
[im 35/53]
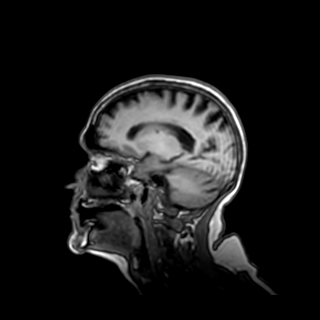
[im 53/53]
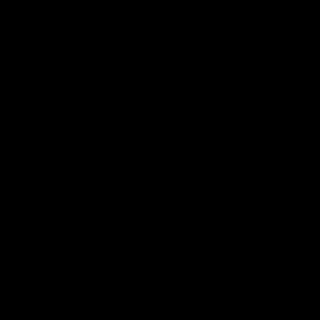

[Series 203: dadc map · axial · 5.0mm · 1.00mm/px · z∈[-76,+78]mm · 2 of 26 slices shown (1 of 2)]
[im 1/26]
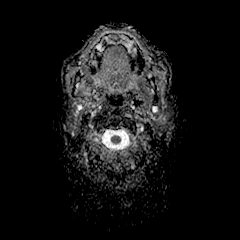
[im 26/26]
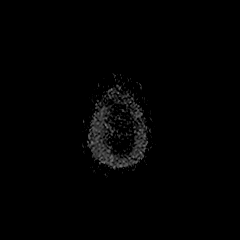

[Series 204: (id) · axial · 5.0mm · 1.00mm/px · z∈[-76,+78]mm · 2 of 27 slices shown (1 of 2)]
[im 1/27]
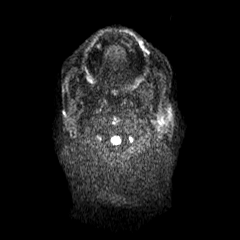
[im 27/27]
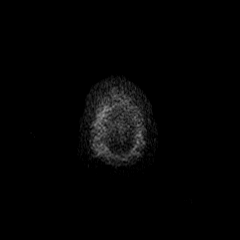

[Series 303: dadc map · coronal · 5.0mm · 0.81mm/px · 2 of 30 slices shown (2 of 2)]
[im 1/30]
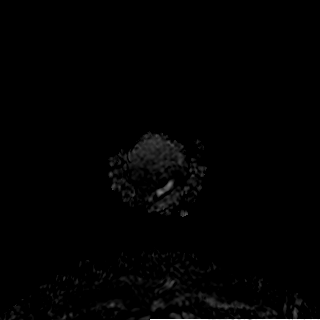
[im 30/30]
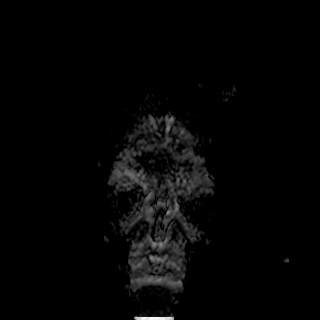

[Series 304: (id) · coronal · 5.0mm · 0.81mm/px · 2 of 30 slices shown (2 of 2)]
[im 1/30]
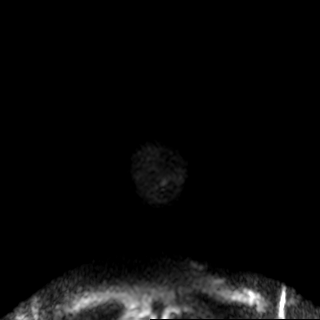
[im 30/30]
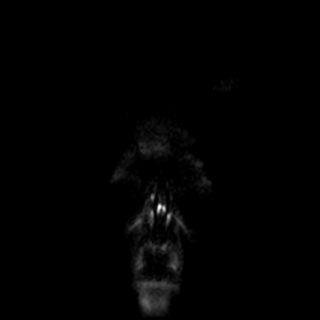

[Series 401: t1_se_sag · sagittal · 4.0mm · 0.43mm/px · 2 of 29 slices shown]
[im 1/29]
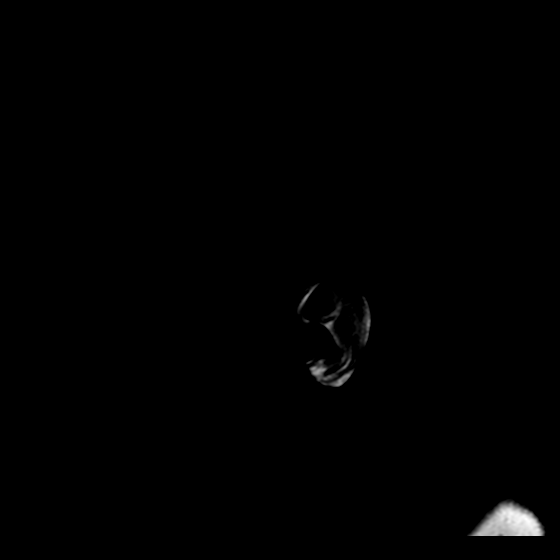
[im 29/29]
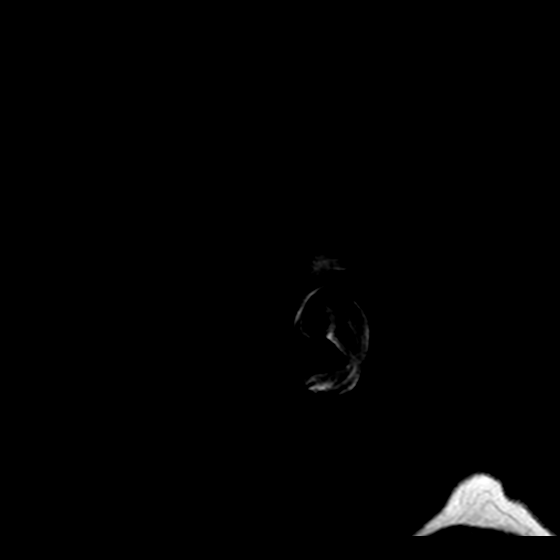

[Series 501: flair_ax+fs · axial · 5.0mm · 0.49mm/px · 1 of 27 slices shown]
[im 1/27]
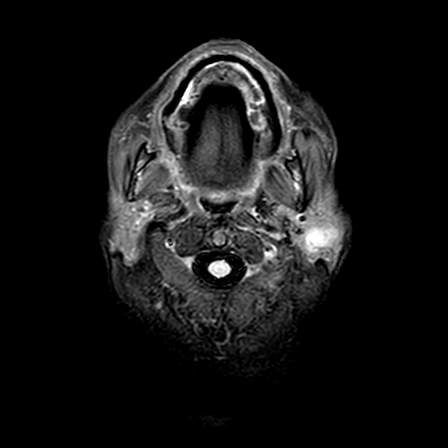

[Series 601: SWI · axial · 3.0mm · 0.40mm/px · z∈[-71,+76]mm · 9 of 200 slices shown]
[im 1/200]
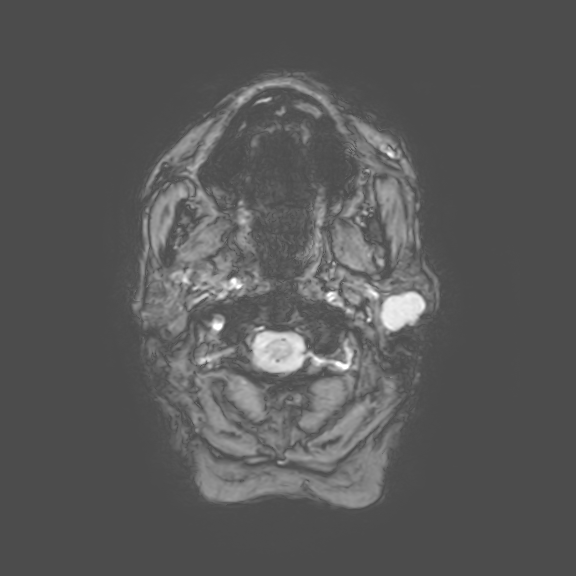
[im 29/200]
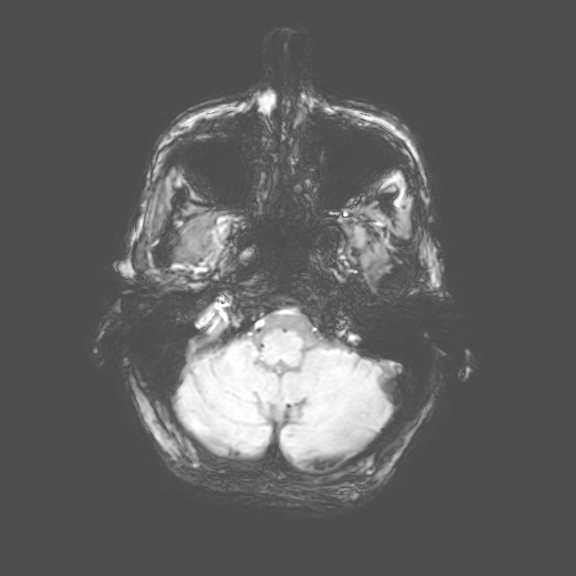
[im 57/200]
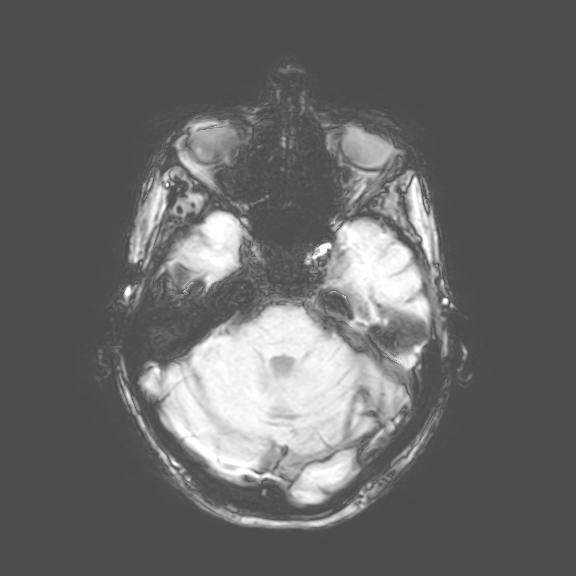
[im 86/200]
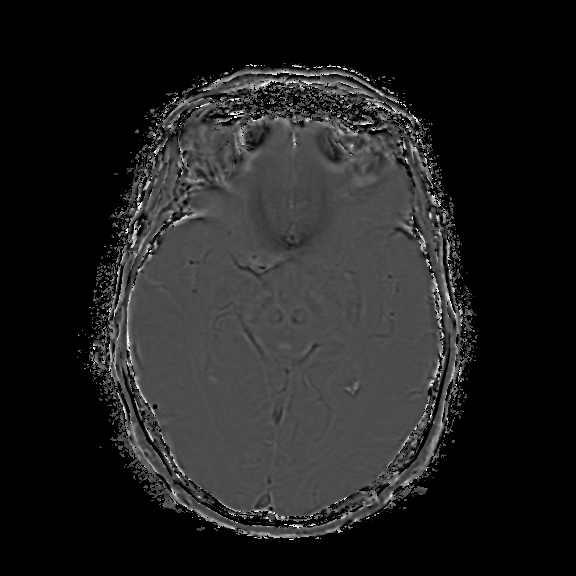
[im 100/200]
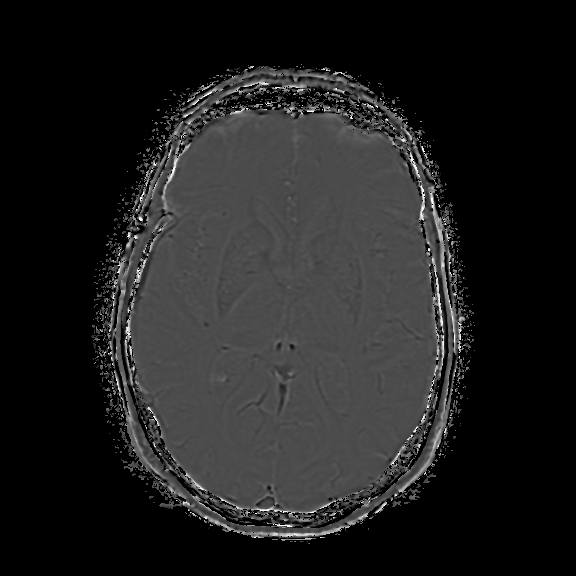
[im 114/200]
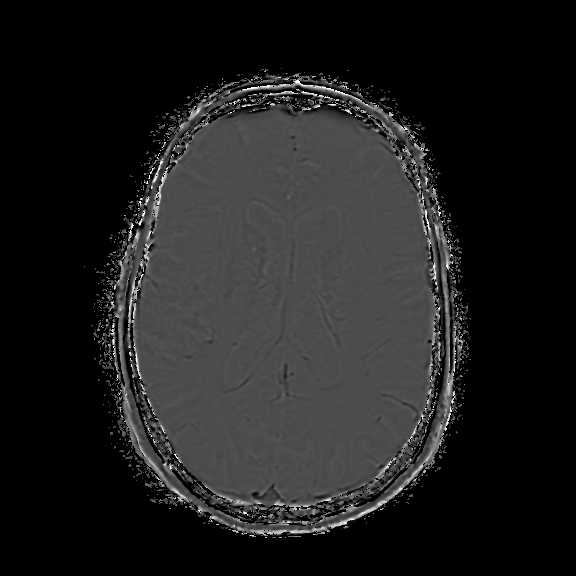
[im 143/200]
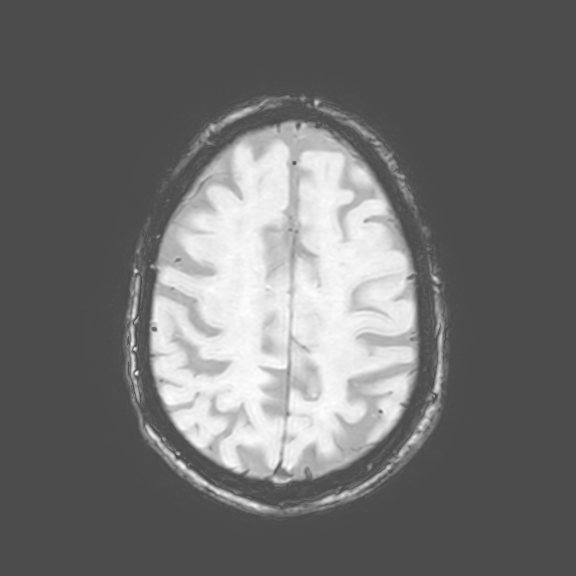
[im 171/200]
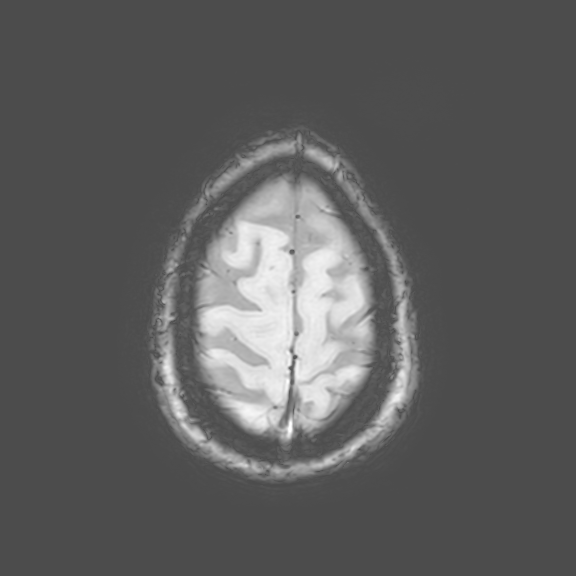
[im 200/200]
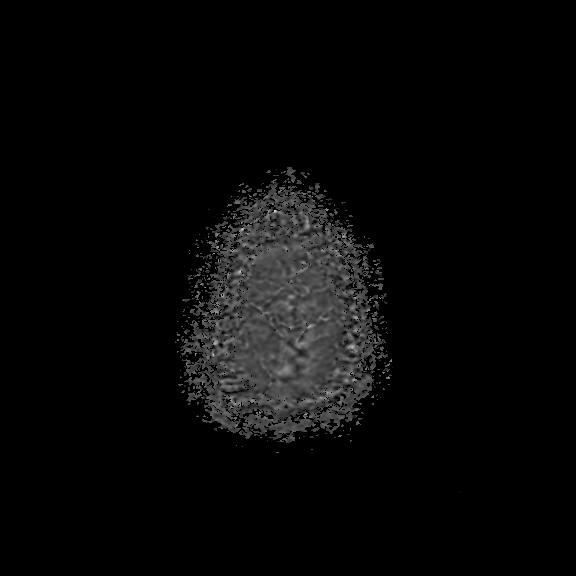

[25 of 48 positions shown; findings below may reference images not displayed]

FINDINGS: -------------------------------------------------------------------------------- 
------------------------- 
INTRACRANIAL: 
Minimal periventricular and deep white matter T2 FLAIR hyperintensity is likely 
chronic microangiopathy. No intracranial mass. No abnormal extra-axial fluid 
collection. Left basal ganglia perivascular space. 
No acute ischemia. No abnormal foci of susceptibility artifact in the brain. 
Patency of the intracranial vascular flow voids.  No acute intracranial 
hemorrhage, mass effect, midline shift. No large sellar mass. No hydrocephalus. 
Cerebral volume is age appropriate.  
-------------------------------------------------------------------------------- 
----------------------- 
OTHER: 
ORBITS/SINUSES/T-BONES:  Bilateral pseudophakia.  Mastoid air cells and middle 
ear cavities are grossly clear.  Mild membrane thickening involving a posterior 
right ethmoid air cell. Stable. 
MARROW SIGNAL/SOFT TISSUES: No focal suspect signal abnormality.  Significant 
bilateral TMJ degenerative changes. Lobulated T2 hyperintense mass involving the 
left parotid gland measures 1.9 x 1.3 cm in transverse dimension and 1.6 cm in 
craniocaudal dimension, appearing stable to prior MRI study. 
-------------------------------------------------------------------------------- 
-------------------
IMPRESSION: No explanation for headaches. 
No acute cranial process. Minimal presumed chronic microangiopathic changes. 
Mild stable appearing membrane thickening involving the posterior right ethmoid 
air cell. 
Stable left parotid mass. Again, correlation with histologic sampling 
recommended.
